# Patient Record
Sex: Female | Born: 1940 | Race: White | Hispanic: No | Marital: Married | Smoking: Never smoker
Health system: Southern US, Community
[De-identification: ages and names within clinical notes are randomized; demographics above are authoritative.]

## PROBLEM LIST (undated history)

## (undated) DIAGNOSIS — I509 Heart failure, unspecified: Secondary | ICD-10-CM

## (undated) DIAGNOSIS — R0902 Hypoxemia: Secondary | ICD-10-CM

## (undated) DIAGNOSIS — Z5189 Encounter for other specified aftercare: Secondary | ICD-10-CM

## (undated) DIAGNOSIS — S83209A Unspecified tear of unspecified meniscus, current injury, unspecified knee, initial encounter: Secondary | ICD-10-CM

## (undated) DIAGNOSIS — IMO0002 Reserved for concepts with insufficient information to code with codable children: Secondary | ICD-10-CM

## (undated) DIAGNOSIS — Z1501 Genetic susceptibility to malignant neoplasm of breast: Secondary | ICD-10-CM

## (undated) DIAGNOSIS — I4510 Unspecified right bundle-branch block: Secondary | ICD-10-CM

## (undated) DIAGNOSIS — Z1509 Genetic susceptibility to other malignant neoplasm: Secondary | ICD-10-CM

## (undated) DIAGNOSIS — H269 Unspecified cataract: Secondary | ICD-10-CM

## (undated) DIAGNOSIS — M5136 Other intervertebral disc degeneration, lumbar region: Secondary | ICD-10-CM

## (undated) DIAGNOSIS — Z923 Personal history of irradiation: Secondary | ICD-10-CM

## (undated) DIAGNOSIS — C801 Malignant (primary) neoplasm, unspecified: Secondary | ICD-10-CM

## (undated) DIAGNOSIS — R87619 Unspecified abnormal cytological findings in specimens from cervix uteri: Secondary | ICD-10-CM

## (undated) DIAGNOSIS — E785 Hyperlipidemia, unspecified: Secondary | ICD-10-CM

## (undated) DIAGNOSIS — E7800 Pure hypercholesterolemia, unspecified: Secondary | ICD-10-CM

## (undated) DIAGNOSIS — M1990 Unspecified osteoarthritis, unspecified site: Secondary | ICD-10-CM

## (undated) DIAGNOSIS — M51369 Other intervertebral disc degeneration, lumbar region without mention of lumbar back pain or lower extremity pain: Secondary | ICD-10-CM

## (undated) DIAGNOSIS — I10 Essential (primary) hypertension: Secondary | ICD-10-CM

## (undated) DIAGNOSIS — T7840XA Allergy, unspecified, initial encounter: Secondary | ICD-10-CM

## (undated) HISTORY — DX: Reserved for concepts with insufficient information to code with codable children: IMO0002

## (undated) HISTORY — PX: TUBAL LIGATION: SHX77

## (undated) HISTORY — DX: Unspecified osteoarthritis, unspecified site: M19.90

## (undated) HISTORY — DX: Unspecified abnormal cytological findings in specimens from cervix uteri: R87.619

## (undated) HISTORY — PX: HERNIA REPAIR: SHX51

## (undated) HISTORY — PX: JOINT REPLACEMENT: SHX530

## (undated) HISTORY — DX: Heart failure, unspecified: I50.9

## (undated) HISTORY — DX: Genetic susceptibility to other malignant neoplasm: Z15.09

## (undated) HISTORY — DX: Allergy, unspecified, initial encounter: T78.40XA

## (undated) HISTORY — DX: Hypoxemia: R09.02

## (undated) HISTORY — DX: Genetic susceptibility to malignant neoplasm of breast: Z15.01

## (undated) HISTORY — DX: Encounter for other specified aftercare: Z51.89

## (undated) HISTORY — DX: Unspecified cataract: H26.9

## (undated) HISTORY — DX: Pure hypercholesterolemia, unspecified: E78.00

## (undated) HISTORY — PX: TOTAL HIP ARTHROPLASTY: SHX124

## (undated) HISTORY — DX: Essential (primary) hypertension: I10

## (undated) HISTORY — DX: Hyperlipidemia, unspecified: E78.5

---

## 2002-04-27 ENCOUNTER — Encounter: Payer: Self-pay | Admitting: Family Medicine

## 2002-06-08 ENCOUNTER — Encounter: Payer: Self-pay | Admitting: Family Medicine

## 2005-03-13 ENCOUNTER — Encounter: Payer: Self-pay | Admitting: Family Medicine

## 2006-06-12 LAB — CONVERTED CEMR LAB: Pap Smear: NORMAL

## 2007-05-13 DIAGNOSIS — Z1501 Genetic susceptibility to malignant neoplasm of breast: Secondary | ICD-10-CM

## 2007-05-13 HISTORY — DX: Genetic susceptibility to malignant neoplasm of breast: Z15.01

## 2007-05-13 HISTORY — PX: BREAST SURGERY: SHX581

## 2007-05-13 HISTORY — PX: BREAST LUMPECTOMY: SHX2

## 2007-06-28 ENCOUNTER — Ambulatory Visit: Payer: Self-pay | Admitting: Family Medicine

## 2007-06-28 DIAGNOSIS — E785 Hyperlipidemia, unspecified: Secondary | ICD-10-CM | POA: Insufficient documentation

## 2007-06-28 DIAGNOSIS — I10 Essential (primary) hypertension: Secondary | ICD-10-CM | POA: Insufficient documentation

## 2007-06-28 DIAGNOSIS — I4510 Unspecified right bundle-branch block: Secondary | ICD-10-CM | POA: Insufficient documentation

## 2007-06-30 ENCOUNTER — Ambulatory Visit: Payer: Self-pay | Admitting: Family Medicine

## 2007-07-08 ENCOUNTER — Encounter: Payer: Self-pay | Admitting: Family Medicine

## 2007-07-08 ENCOUNTER — Ambulatory Visit: Payer: Self-pay | Admitting: Family Medicine

## 2007-07-08 ENCOUNTER — Other Ambulatory Visit: Admission: RE | Admit: 2007-07-08 | Discharge: 2007-07-08 | Payer: Self-pay | Admitting: Family Medicine

## 2007-07-08 LAB — CONVERTED CEMR LAB
ALT: 24 units/L (ref 0–35)
Alkaline Phosphatase: 48 units/L (ref 39–117)
BUN: 21 mg/dL (ref 6–23)
CO2: 29 meq/L (ref 19–32)
Creatinine, Ser: 1 mg/dL (ref 0.4–1.2)
Potassium: 4 meq/L (ref 3.5–5.1)
Total Bilirubin: 0.7 mg/dL (ref 0.3–1.2)
Total Protein: 7 g/dL (ref 6.0–8.3)
Triglycerides: 114 mg/dL (ref 0–149)

## 2007-07-29 ENCOUNTER — Encounter: Admission: RE | Admit: 2007-07-29 | Discharge: 2007-07-29 | Payer: Self-pay | Admitting: Family Medicine

## 2007-08-10 ENCOUNTER — Ambulatory Visit: Payer: Self-pay | Admitting: Family Medicine

## 2007-08-10 LAB — FECAL OCCULT BLOOD, GUAIAC: Fecal Occult Blood: NEGATIVE

## 2007-08-10 LAB — CONVERTED CEMR LAB: OCCULT 2: NEGATIVE

## 2007-08-12 ENCOUNTER — Ambulatory Visit: Payer: Self-pay | Admitting: Family Medicine

## 2007-08-18 ENCOUNTER — Encounter: Admission: RE | Admit: 2007-08-18 | Discharge: 2007-08-18 | Payer: Self-pay | Admitting: Family Medicine

## 2007-08-18 ENCOUNTER — Encounter (INDEPENDENT_AMBULATORY_CARE_PROVIDER_SITE_OTHER): Payer: Self-pay | Admitting: Diagnostic Radiology

## 2007-08-18 ENCOUNTER — Telehealth: Payer: Self-pay | Admitting: Family Medicine

## 2007-08-18 ENCOUNTER — Encounter: Payer: Self-pay | Admitting: Family Medicine

## 2007-08-18 HISTORY — PX: BREAST BIOPSY: SHX20

## 2007-08-26 ENCOUNTER — Encounter: Admission: RE | Admit: 2007-08-26 | Discharge: 2007-08-26 | Payer: Self-pay | Admitting: Family Medicine

## 2007-10-06 ENCOUNTER — Encounter: Payer: Self-pay | Admitting: Family Medicine

## 2007-10-06 ENCOUNTER — Encounter (HOSPITAL_BASED_OUTPATIENT_CLINIC_OR_DEPARTMENT_OTHER): Payer: Self-pay | Admitting: General Surgery

## 2007-10-06 ENCOUNTER — Encounter: Admission: RE | Admit: 2007-10-06 | Discharge: 2007-10-06 | Payer: Self-pay | Admitting: General Surgery

## 2007-10-06 ENCOUNTER — Ambulatory Visit (HOSPITAL_COMMUNITY): Admission: RE | Admit: 2007-10-06 | Discharge: 2007-10-06 | Payer: Self-pay | Admitting: General Surgery

## 2007-10-15 ENCOUNTER — Encounter: Payer: Self-pay | Admitting: Family Medicine

## 2007-10-16 ENCOUNTER — Emergency Department (HOSPITAL_COMMUNITY): Admission: EM | Admit: 2007-10-16 | Discharge: 2007-10-16 | Payer: Self-pay | Admitting: Emergency Medicine

## 2007-11-03 ENCOUNTER — Encounter: Payer: Self-pay | Admitting: Family Medicine

## 2007-11-03 LAB — HM COLONOSCOPY: HM Colonoscopy: NORMAL

## 2007-11-08 ENCOUNTER — Ambulatory Visit: Payer: Self-pay | Admitting: Oncology

## 2007-11-10 ENCOUNTER — Ambulatory Visit: Admission: RE | Admit: 2007-11-10 | Discharge: 2008-01-28 | Payer: Self-pay | Admitting: Radiation Oncology

## 2007-11-30 LAB — CBC WITH DIFFERENTIAL/PLATELET
Basophils Absolute: 0 10*3/uL (ref 0.0–0.1)
EOS%: 5.6 % (ref 0.0–7.0)
Eosinophils Absolute: 0.2 10*3/uL (ref 0.0–0.5)
HGB: 12.8 g/dL (ref 11.6–15.9)
MONO#: 0.4 10*3/uL (ref 0.1–0.9)
NEUT#: 2 10*3/uL (ref 1.5–6.5)
RDW: 13.6 % (ref 11.3–14.5)
lymph#: 1.8 10*3/uL (ref 0.9–3.3)

## 2007-12-01 LAB — COMPREHENSIVE METABOLIC PANEL
AST: 20 U/L (ref 0–37)
Albumin: 4.2 g/dL (ref 3.5–5.2)
BUN: 23 mg/dL (ref 6–23)
CO2: 26 mEq/L (ref 19–32)
Calcium: 9.2 mg/dL (ref 8.4–10.5)
Chloride: 105 mEq/L (ref 96–112)
Glucose, Bld: 108 mg/dL — ABNORMAL HIGH (ref 70–99)
Potassium: 3.7 mEq/L (ref 3.5–5.3)

## 2007-12-01 LAB — VITAMIN D 25 HYDROXY (VIT D DEFICIENCY, FRACTURES): Vit D, 25-Hydroxy: 29 ng/mL — ABNORMAL LOW (ref 30–89)

## 2007-12-27 ENCOUNTER — Ambulatory Visit: Payer: Self-pay | Admitting: Family Medicine

## 2008-01-10 ENCOUNTER — Ambulatory Visit: Payer: Self-pay | Admitting: Oncology

## 2008-01-13 ENCOUNTER — Encounter: Payer: Self-pay | Admitting: Family Medicine

## 2008-01-24 ENCOUNTER — Ambulatory Visit (HOSPITAL_COMMUNITY): Admission: RE | Admit: 2008-01-24 | Discharge: 2008-01-24 | Payer: Self-pay | Admitting: Oncology

## 2008-02-15 ENCOUNTER — Ambulatory Visit: Payer: Self-pay | Admitting: Family Medicine

## 2008-04-21 ENCOUNTER — Ambulatory Visit: Payer: Self-pay | Admitting: Oncology

## 2008-04-25 LAB — CBC WITH DIFFERENTIAL/PLATELET
BASO%: 0.5 % (ref 0.0–2.0)
Eosinophils Absolute: 0.2 10*3/uL (ref 0.0–0.5)
HCT: 36.6 % (ref 34.8–46.6)
LYMPH%: 39.4 % (ref 14.0–48.0)
MCHC: 34.7 g/dL (ref 32.0–36.0)
MCV: 99.9 fL (ref 81.0–101.0)
MONO#: 0.3 10*3/uL (ref 0.1–0.9)
MONO%: 8.6 % (ref 0.0–13.0)
NEUT%: 47.2 % (ref 39.6–76.8)
Platelets: 157 10*3/uL (ref 145–400)
WBC: 3.6 10*3/uL — ABNORMAL LOW (ref 3.9–10.0)

## 2008-04-26 LAB — COMPREHENSIVE METABOLIC PANEL
Alkaline Phosphatase: 52 U/L (ref 39–117)
CO2: 25 mEq/L (ref 19–32)
Creatinine, Ser: 1.24 mg/dL — ABNORMAL HIGH (ref 0.40–1.20)
Glucose, Bld: 107 mg/dL — ABNORMAL HIGH (ref 70–99)
Total Bilirubin: 0.5 mg/dL (ref 0.3–1.2)

## 2008-04-26 LAB — LACTATE DEHYDROGENASE: LDH: 146 U/L (ref 94–250)

## 2008-04-26 LAB — VITAMIN D 25 HYDROXY (VIT D DEFICIENCY, FRACTURES): Vit D, 25-Hydroxy: 32 ng/mL (ref 30–89)

## 2008-05-02 ENCOUNTER — Encounter: Payer: Self-pay | Admitting: Family Medicine

## 2008-05-30 ENCOUNTER — Telehealth: Payer: Self-pay | Admitting: Family Medicine

## 2008-07-04 ENCOUNTER — Ambulatory Visit: Payer: Self-pay | Admitting: Family Medicine

## 2008-07-11 ENCOUNTER — Other Ambulatory Visit: Admission: RE | Admit: 2008-07-11 | Discharge: 2008-07-11 | Payer: Self-pay | Admitting: Family Medicine

## 2008-07-11 ENCOUNTER — Encounter: Payer: Self-pay | Admitting: Family Medicine

## 2008-07-11 ENCOUNTER — Ambulatory Visit: Payer: Self-pay | Admitting: Family Medicine

## 2008-07-11 LAB — CONVERTED CEMR LAB
Albumin: 3.5 g/dL (ref 3.5–5.2)
BUN: 18 mg/dL (ref 6–23)
Cholesterol: 139 mg/dL (ref 0–200)
Creatinine, Ser: 1.2 mg/dL (ref 0.4–1.2)
GFR calc Af Amer: 58 mL/min
GFR calc non Af Amer: 48 mL/min
HDL: 43.5 mg/dL (ref >39.0)
LDL Cholesterol: 76 mg/dL (ref 0–99)
Triglycerides: 99 mg/dL (ref 0–149)
VLDL: 20 mg/dL (ref 0–40)

## 2008-07-13 ENCOUNTER — Encounter (INDEPENDENT_AMBULATORY_CARE_PROVIDER_SITE_OTHER): Payer: Self-pay | Admitting: *Deleted

## 2008-08-01 ENCOUNTER — Encounter: Admission: RE | Admit: 2008-08-01 | Discharge: 2008-08-01 | Payer: Self-pay | Admitting: Oncology

## 2008-08-17 ENCOUNTER — Ambulatory Visit: Payer: Self-pay | Admitting: Oncology

## 2008-08-21 LAB — CBC WITH DIFFERENTIAL/PLATELET
BASO%: 0.4 % (ref 0.0–2.0)
EOS%: 2.6 % (ref 0.0–7.0)
MCH: 34.4 pg — ABNORMAL HIGH (ref 25.1–34.0)
MCHC: 34.3 g/dL (ref 31.5–36.0)
MONO#: 0.3 10*3/uL (ref 0.1–0.9)
NEUT%: 46.7 % (ref 38.4–76.8)
RBC: 3.62 10*6/uL — ABNORMAL LOW (ref 3.70–5.45)
RDW: 13.4 % (ref 11.2–14.5)
WBC: 4.4 10*3/uL (ref 3.9–10.3)
lymph#: 1.9 10*3/uL (ref 0.9–3.3)

## 2008-08-22 LAB — COMPREHENSIVE METABOLIC PANEL
ALT: 20 U/L (ref 0–35)
AST: 18 U/L (ref 0–37)
CO2: 28 mEq/L (ref 19–32)
Calcium: 9.6 mg/dL (ref 8.4–10.5)
Chloride: 102 mEq/L (ref 96–112)
Creatinine, Ser: 1.22 mg/dL — ABNORMAL HIGH (ref 0.40–1.20)
Potassium: 3.8 mEq/L (ref 3.5–5.3)
Sodium: 140 mEq/L (ref 135–145)
Total Protein: 7.2 g/dL (ref 6.0–8.3)

## 2008-08-28 ENCOUNTER — Encounter: Payer: Self-pay | Admitting: Family Medicine

## 2008-09-01 ENCOUNTER — Encounter: Payer: Self-pay | Admitting: Family Medicine

## 2008-11-07 ENCOUNTER — Ambulatory Visit: Payer: Self-pay | Admitting: Family Medicine

## 2008-11-08 ENCOUNTER — Encounter: Admission: RE | Admit: 2008-11-08 | Discharge: 2008-11-08 | Payer: Self-pay | Admitting: Family Medicine

## 2008-11-09 ENCOUNTER — Encounter: Payer: Self-pay | Admitting: Family Medicine

## 2008-11-10 ENCOUNTER — Encounter: Payer: Self-pay | Admitting: Family Medicine

## 2008-11-17 ENCOUNTER — Encounter: Payer: Self-pay | Admitting: Family Medicine

## 2008-12-28 ENCOUNTER — Encounter: Payer: Self-pay | Admitting: Family Medicine

## 2009-01-29 ENCOUNTER — Ambulatory Visit: Payer: Self-pay | Admitting: Family Medicine

## 2009-02-16 ENCOUNTER — Ambulatory Visit: Payer: Self-pay | Admitting: Oncology

## 2009-02-20 LAB — CBC WITH DIFFERENTIAL/PLATELET
Basophils Absolute: 0 10*3/uL (ref 0.0–0.1)
EOS%: 2.9 % (ref 0.0–7.0)
Eosinophils Absolute: 0.1 10*3/uL (ref 0.0–0.5)
HCT: 35.3 % (ref 34.8–46.6)
HGB: 12.2 g/dL (ref 11.6–15.9)
MCH: 34.9 pg — ABNORMAL HIGH (ref 25.1–34.0)
MCV: 100.6 fL (ref 79.5–101.0)
MONO%: 7 % (ref 0.0–14.0)
NEUT#: 2.2 10*3/uL (ref 1.5–6.5)
NEUT%: 51 % (ref 38.4–76.8)
RDW: 13.3 % (ref 11.2–14.5)
lymph#: 1.6 10*3/uL (ref 0.9–3.3)

## 2009-02-21 LAB — COMPREHENSIVE METABOLIC PANEL
AST: 23 U/L (ref 0–37)
Albumin: 3.8 g/dL (ref 3.5–5.2)
BUN: 24 mg/dL — ABNORMAL HIGH (ref 6–23)
Calcium: 9.4 mg/dL (ref 8.4–10.5)
Chloride: 105 mEq/L (ref 96–112)
Creatinine, Ser: 1.06 mg/dL (ref 0.40–1.20)
Glucose, Bld: 120 mg/dL — ABNORMAL HIGH (ref 70–99)
Potassium: 3.9 mEq/L (ref 3.5–5.3)

## 2009-02-21 LAB — CANCER ANTIGEN 27.29: CA 27.29: 14 U/mL (ref 0–39)

## 2009-02-21 LAB — LACTATE DEHYDROGENASE: LDH: 154 U/L (ref 94–250)

## 2009-02-21 LAB — VITAMIN D 25 HYDROXY (VIT D DEFICIENCY, FRACTURES): Vit D, 25-Hydroxy: 32 ng/mL (ref 30–89)

## 2009-02-27 ENCOUNTER — Encounter: Payer: Self-pay | Admitting: Family Medicine

## 2009-03-20 ENCOUNTER — Ambulatory Visit: Payer: Self-pay | Admitting: Family Medicine

## 2009-06-30 ENCOUNTER — Encounter
Admission: RE | Admit: 2009-06-30 | Discharge: 2009-06-30 | Payer: Self-pay | Source: Home / Self Care | Admitting: Orthopedic Surgery

## 2009-07-10 LAB — HM MAMMOGRAPHY: HM Mammogram: NORMAL

## 2009-07-25 ENCOUNTER — Telehealth: Payer: Self-pay | Admitting: Family Medicine

## 2009-08-02 ENCOUNTER — Encounter: Admission: RE | Admit: 2009-08-02 | Discharge: 2009-08-02 | Payer: Self-pay | Admitting: Oncology

## 2009-08-08 ENCOUNTER — Ambulatory Visit: Payer: Self-pay | Admitting: Oncology

## 2009-08-09 ENCOUNTER — Ambulatory Visit: Payer: Self-pay | Admitting: Family Medicine

## 2009-08-10 LAB — CONVERTED CEMR LAB
AST: 24 units/L (ref 0–37)
Albumin: 3.5 g/dL (ref 3.5–5.2)
BUN: 20 mg/dL (ref 6–23)
CO2: 26 meq/L (ref 19–32)
Calcium: 9.2 mg/dL (ref 8.4–10.5)
Cholesterol: 157 mg/dL (ref 0–200)
GFR calc non Af Amer: 52.68 mL/min (ref >60)
Glucose, Bld: 97 mg/dL (ref 70–99)
HDL: 54.7 mg/dL (ref >39.00)
Potassium: 4.1 meq/L (ref 3.5–5.1)
Total Protein: 7.3 g/dL (ref 6.0–8.3)
VLDL: 25.2 mg/dL (ref 0.0–40.0)

## 2009-08-10 LAB — CANCER ANTIGEN 27.29: CA 27.29: 12 U/mL (ref 0–39)

## 2009-08-10 LAB — LACTATE DEHYDROGENASE: LDH: 150 U/L (ref 94–250)

## 2009-08-10 LAB — COMPREHENSIVE METABOLIC PANEL
BUN: 23 mg/dL (ref 6–23)
CO2: 25 mEq/L (ref 19–32)
Creatinine, Ser: 0.99 mg/dL (ref 0.40–1.20)
Glucose, Bld: 105 mg/dL — ABNORMAL HIGH (ref 70–99)
Total Bilirubin: 0.3 mg/dL (ref 0.3–1.2)

## 2009-08-10 LAB — CBC WITH DIFFERENTIAL/PLATELET
BASO%: 0.5 % (ref 0.0–2.0)
Eosinophils Absolute: 0.2 10*3/uL (ref 0.0–0.5)
HCT: 37 % (ref 34.8–46.6)
LYMPH%: 34.2 % (ref 14.0–49.7)
MCHC: 34.1 g/dL (ref 31.5–36.0)
MONO#: 0.4 10*3/uL (ref 0.1–0.9)
NEUT%: 54.6 % (ref 38.4–76.8)
Platelets: 180 10*3/uL (ref 145–400)
WBC: 4.9 10*3/uL (ref 3.9–10.3)

## 2009-08-10 LAB — VITAMIN D 25 HYDROXY (VIT D DEFICIENCY, FRACTURES): Vit D, 25-Hydroxy: 35 ng/mL (ref 30–89)

## 2009-08-17 ENCOUNTER — Encounter: Payer: Self-pay | Admitting: Family Medicine

## 2009-10-12 ENCOUNTER — Ambulatory Visit: Payer: Self-pay | Admitting: Family Medicine

## 2009-10-12 ENCOUNTER — Other Ambulatory Visit: Admission: RE | Admit: 2009-10-12 | Discharge: 2009-10-12 | Payer: Self-pay | Admitting: Family Medicine

## 2009-10-16 ENCOUNTER — Encounter: Payer: Self-pay | Admitting: Family Medicine

## 2009-10-18 ENCOUNTER — Encounter
Admission: RE | Admit: 2009-10-18 | Discharge: 2009-10-18 | Payer: Self-pay | Source: Home / Self Care | Admitting: Family Medicine

## 2009-10-18 ENCOUNTER — Encounter: Payer: Self-pay | Admitting: Family Medicine

## 2009-10-19 ENCOUNTER — Encounter (INDEPENDENT_AMBULATORY_CARE_PROVIDER_SITE_OTHER): Payer: Self-pay | Admitting: *Deleted

## 2009-10-19 LAB — CONVERTED CEMR LAB: Pap Smear: NEGATIVE

## 2009-11-14 ENCOUNTER — Telehealth: Payer: Self-pay | Admitting: Family Medicine

## 2010-02-26 ENCOUNTER — Ambulatory Visit: Payer: Self-pay | Admitting: Oncology

## 2010-02-28 LAB — COMPREHENSIVE METABOLIC PANEL
ALT: 16 U/L (ref 0–35)
AST: 17 U/L (ref 0–37)
Albumin: 3.8 g/dL (ref 3.5–5.2)
Alkaline Phosphatase: 48 U/L (ref 39–117)
BUN: 26 mg/dL — ABNORMAL HIGH (ref 6–23)
Calcium: 9.5 mg/dL (ref 8.4–10.5)
Chloride: 105 mEq/L (ref 96–112)
Potassium: 3.9 mEq/L (ref 3.5–5.3)
Sodium: 143 mEq/L (ref 135–145)
Total Protein: 7 g/dL (ref 6.0–8.3)

## 2010-02-28 LAB — CBC WITH DIFFERENTIAL/PLATELET
BASO%: 0.5 % (ref 0.0–2.0)
Basophils Absolute: 0 10*3/uL (ref 0.0–0.1)
EOS%: 1.7 % (ref 0.0–7.0)
Eosinophils Absolute: 0.1 10*3/uL (ref 0.0–0.5)
HCT: 36.1 % (ref 34.8–46.6)
HGB: 12.6 g/dL (ref 11.6–15.9)
LYMPH%: 28.4 % (ref 14.0–49.7)
MCH: 35.2 pg — ABNORMAL HIGH (ref 25.1–34.0)
MCHC: 34.8 g/dL (ref 31.5–36.0)
MCV: 101.2 fL — ABNORMAL HIGH (ref 79.5–101.0)
MONO#: 0.3 10*3/uL (ref 0.1–0.9)
MONO%: 6.4 % (ref 0.0–14.0)
NEUT#: 2.9 10*3/uL (ref 1.5–6.5)
NEUT%: 63 % (ref 38.4–76.8)
Platelets: 156 10*3/uL (ref 145–400)
RBC: 3.56 10*6/uL — ABNORMAL LOW (ref 3.70–5.45)
RDW: 13.1 % (ref 11.2–14.5)
WBC: 4.6 10*3/uL (ref 3.9–10.3)
lymph#: 1.3 10*3/uL (ref 0.9–3.3)

## 2010-03-12 ENCOUNTER — Encounter: Payer: Self-pay | Admitting: Family Medicine

## 2010-04-02 ENCOUNTER — Encounter: Payer: Self-pay | Admitting: Family Medicine

## 2010-04-17 ENCOUNTER — Encounter: Payer: Self-pay | Admitting: Family Medicine

## 2010-04-17 ENCOUNTER — Emergency Department (HOSPITAL_COMMUNITY)
Admission: EM | Admit: 2010-04-17 | Discharge: 2010-04-17 | Payer: Self-pay | Source: Home / Self Care | Admitting: Emergency Medicine

## 2010-05-12 HISTORY — PX: DILATION AND CURETTAGE OF UTERUS: SHX78

## 2010-05-31 ENCOUNTER — Ambulatory Visit
Admission: RE | Admit: 2010-05-31 | Discharge: 2010-05-31 | Payer: Self-pay | Source: Home / Self Care | Attending: Family Medicine | Admitting: Family Medicine

## 2010-06-01 ENCOUNTER — Other Ambulatory Visit: Payer: Self-pay | Admitting: Oncology

## 2010-06-01 DIAGNOSIS — Z9889 Other specified postprocedural states: Secondary | ICD-10-CM

## 2010-06-02 ENCOUNTER — Encounter: Payer: Self-pay | Admitting: Family Medicine

## 2010-06-04 ENCOUNTER — Telehealth: Payer: Self-pay | Admitting: Family Medicine

## 2010-06-11 ENCOUNTER — Telehealth: Payer: Self-pay | Admitting: Family Medicine

## 2010-06-11 NOTE — Letter (Signed)
[facility]   Imported By: Lanelle Bal 09/06/2009 14:01:20  _____________________________________________________________________  External Attachment:    Type:   Image     Comment:   External Document

## 2010-06-11 NOTE — Letter (Signed)
[facility]   Imported By: Lester Lake 10/29/2009 12:28:59  _____________________________________________________________________  External Attachment:    Type:   Image     Comment:   External Document

## 2010-06-11 NOTE — Letter (Signed)
Summary: [facility]  [facility]   Imported By: Maryln Gottron 04/08/2010 12:21:37  _____________________________________________________________________  External Attachment:    Type:   Image     Comment:   External Document

## 2010-06-11 NOTE — Progress Notes (Signed)
Summary: shingles vaccine  Phone Note Call from Patient Call back at Home Phone [phone]   Caller: Patient Call For: Kerby Nora MD Summary of Call: Patient is asking for a rx for the shingles vaccine so that she can take it to her pharmacy. She wants the rx given to [facility] to take it to her.  Initial call taken by: Melody Comas,  November 14, 2009 3:47 PM  Follow-up for Phone Call        Rx in out box     Herpes Zoster Result Date:  11/14/2009 Herpes Zoster Result:  given at [facility]

## 2010-06-11 NOTE — Progress Notes (Signed)
Summary: LABS  Phone Note Call from Patient   Caller: Patient Call For: Kerby Nora MD Summary of Call: PATIENT WANTS TO KNOW WHAT LABS SHE NEEDS HAVE DONE.. Initial call taken by: Benny Lennert CMA Duncan Dull),  July 25, 2009 2:59 PM  Follow-up for Phone Call        Please scheulde for CPX with labs prior.  Dx 272.0 CMET, lipids Fasting Follow-up by: Kerby Nora MD,  July 25, 2009 3:01 PM  Additional Follow-up for Phone Call Additional follow up Details #1::        APPT made and patient advised Additional Follow-up by: Benny Lennert CMA Duncan Dull),  July 26, 2009 9:31 AM

## 2010-06-11 NOTE — Assessment & Plan Note (Signed)
Summary: CPX/HMW   Vital Signs:  Patient profile:   70 year old female Height:      65.25 inches Weight:      199.4 pounds BMI:     33.05 Temp:     98.5 degrees F oral Pulse rate:   64 / minute Pulse rhythm:   regular BP sitting:   122 / 76  (left arm) Cuff size:   large  Vitals Entered By: Benny Lennert CMA Duncan Dull) (October 12, 2009 2:32 PM)  History of Present Illness: Chief complaint cpx  Cortisone in back and hip..some improvement. ORTHO..Dr. Charlann Boxer. Possible need for hip replacement. Going to [facility] 3 times a week.  Weight loss 5 lbs in last 6 months.  High cholesterol well controlle don crestor and fish oil.   Breast Cancer 2 years since surgery...on tamoxifen for 5 years.  Stopped actonel.  Due for bone density.   Hypertension History:      She denies headache, chest pain, palpitations, dyspnea with exertion, orthopnea, peripheral edema, and side effects from treatment.  Well controlled. Continue current medication. Marland Kitchen        Positive major cardiovascular risk factors include female age 32 years old or older, hyperlipidemia, and hypertension.  Negative major cardiovascular risk factors include non-tobacco-user status.     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 Take 1/2 Tablet By Mouth Once Daily 2)  Crestor 10 Mg  Tabs (Rosuvastatin Calcium) .... Take 1 Tablet By Mouth 3 Times Per Week 3)  Adult Aspirin Ec Low Strength 81 Mg  Tbec (Aspirin) .... Take 1 Tablet By Mouth Once A Day 4)  Multivitamins   Tabs (Multiple Vitamin) .... Take 1 Tablet By Mouth Once A Day 5)  Fish Oil 1000 Mg  Caps (Omega-3 Fatty  Acids) .... Take 1 Capsule By Mouth Once A Day 6)  Sam-E Complete 200 Mg  Tbec (S-Adenosylmethionine) .... Take 1 Tablet By Mouth Once A Day 7)  Msm Glucosamine Complex   Tabs (Glucos-Msm-C-Mn-Ginger-Willow) .... Take 1 Tablet By Mouth Once A Day 8)  Calcium Citrate 200 Mg Tabs (Calcium Citrate) .... 1,000 Mg. Take 1 Tablet By Mouth Two Times A Day 9)  B-100  Tabs (Vitamins-Lipotropics) .... Take 1 Tablet By Mouth Once A Day 10)  Tamoxifen Citrate 10 Mg Tabs (Tamoxifen Citrate) .... Take 1 Tablet By Mouth Once A Day 11)  Vitamin D 2000 Unit Tabs (Cholecalciferol) .... Takes 2 Tablets Daily  Allergies: 1)  ! Penicillin 2)  ! Suprax  Past History:  Past medical, surgical, family and social histories (including risk factors) reviewed, and no changes noted (except as noted below).  Past Medical History: Reviewed history from 11/07/2008 and no changes required. Hypertension Hyperlipidemia BRCA  Past Surgical History: Reviewed history from 07/11/2008 and no changes required. 7/07 umbilical hernia repair 1986 cyst from neck, anterior 1981 BTL 09/2007 lumpectomy, radiation, no checmo  Family History: Reviewed history from 06/28/2007 and no changes required. father: HTN, heart problems, low thyroid Mother: breast cancer age 54, colon cancer age 73 son with Down's syndrome siblings; asthma  Social History: Reviewed history from 06/28/2007 and no changes required. 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 CV:  Denies chest pain or discomfort. Resp:  Denies shortness of breath. GI:  Denies abdominal pain, bloody stools, constipation, and diarrhea. GU:  Denies dysuria.  Physical Exam  General:  GEN:  Well-developed,well-nourished,in no acute distress; alert,appropriate and cooperative throughout examination HEENT: Normocephalic and atraumatic without obvious abnormalities. No apparent alopecia or balding. Ears, externally no deformities PULM: Breathing comfortably in no respiratory distress EXT: No clubbing, cyanosis, or edema PSYCH: Normally interactive. Cooperative during the interview. Pleasant. Friendly and conversant. Not anxious or depressed appearing. Normal, full affect.  Eyes:  No corneal or conjunctival inflammation noted. EOMI. Perrla. Funduscopic exam benign, without hemorrhages, exudates or papilledema. Vision grossly normal. Ears:  External ear exam shows no significant lesions or deformities.  Otoscopic examination reveals clear canals, tympanic membranes are intact bilaterally without bulging, retraction, inflammation or discharge. Hearing is grossly normal bilaterally. Nose:  External nasal examination shows no deformity or inflammation. Nasal mucosa are pink and moist without lesions or exudates. Mouth:  Oral mucosa and oropharynx without lesions or exudates.  Teeth in good repair. Neck:  no carotid bruit or thyromegaly no cervical or supraclavicular lymphadenopathy  Chest Wall:  No deformities, masses, or tenderness noted. Breasts:  No mass, nodules, thickening, tenderness, bulging, retraction, inflamation, nipple discharge or skin changes noted.   Healed scars noted.  Lungs:  Normal respiratory effort, chest expands symmetrically. Lungs are clear to auscultation, no crackles or wheezes. Heart:  Normal rate and regular rhythm. S1 and S2 normal without gallop, murmur, click, rub or other extra sounds. Abdomen:  Bowel sounds positive,abdomen soft and non-tender without masses, organomegaly or hernias noted. Rectal:  no external abnormalities.   Genitalia:  Pelvic Exam:        External: normal female genitalia without lesions or masses        Vagina: normal without lesions or  masses        Cervix: normal without lesions or masses        Adnexa: normal bimanual exam without masses or fullness        Uterus: normal by palpation        Pap smear: performed No urethral abnormalities.  Msk:  No deformity or scoliosis noted of thoracic or lumbar spine.   Pulses:  R and L posterior tibial pulses are full and equal bilaterally  Extremities:  no edema Skin:  Intact without suspicious lesions or rashes Psych:  Cognition and judgment appear intact. Alert and cooperative with normal attention span and concentration. No apparent delusions, illusions, hallucinations   Impression & Recommendations:  Problem # 1:  HYPERLIPIDEMIA (ICD-272.4)  Well controlled. Continue current medication.  Her updated medication list for this problem includes:    Crestor 10 Mg Tabs (Rosuvastatin calcium) .Marland Kitchen... Take 1 tablet by mouth 3 times per week  Labs Reviewed: SGOT: 24 (08/09/2009)   SGPT: 24 (08/09/2009)  10 Yr Risk Heart Disease: 7 % Prior 10 Yr Risk Heart Disease: 13 % (07/11/2008)   HDL:54.70 (08/09/2009), 43.5 (07/04/2008)  LDL:77 (08/09/2009), 76 (07/04/2008)  Chol:157 (08/09/2009), 139 (07/04/2008)  Trig:126.0 (08/09/2009), 99 (07/04/2008)  Problem # 2:  OSTEOPENIA (ICD-733.90) Due for reeval.  Her updated medication list for this problem includes:    Calcium Citrate 200 Mg Tabs (Calcium citrate) .Marland KitchenMarland KitchenMarland KitchenMarland Kitchen  1,000 mg. take 1 tablet by mouth two times a day    Vitamin D 2000 Unit Tabs (Cholecalciferol) .Marland Kitchen... Takes 2 tablets daily  Orders: Radiology Referral (Radiology)  Problem # 3:  HYPERTENSION (ICD-401.9)  Well controlled. Continue current medication.  Her updated medication list for this problem includes:    Maxzide-25 37.5-25 Mg Tabs (Triamterene-hctz) .Marland Kitchen... Take 1/2 tablet by mouth once daily  Orders: Prescription Created Electronically [phone])  BP today: 122/76 Prior BP: 118/78 (03/20/2009)  10 Yr Risk Heart Disease: 7 % Prior 10 Yr Risk Heart Disease: 13 %  (07/11/2008)  Labs Reviewed: K+: 4.1 (08/09/2009) Creat: : 1.1 (08/09/2009)   Chol: 157 (08/09/2009)   HDL: 54.70 (08/09/2009)   LDL: 77 (08/09/2009)   TG: 126.0 (08/09/2009)  Problem # 4:  ROUTINE GYNECOLOGICAL EXAMINATION (ICD-V72.31)  Orders: Pelvic & Breast Exam ( Medicare)  ([id number])  Complete Medication List: 1)  Maxzide-25 37.5-25 Mg Tabs (Triamterene-hctz) .... Take 1/2 tablet by mouth once daily 2)  Crestor 10 Mg Tabs (Rosuvastatin calcium) .... Take 1 tablet by mouth 3 times per week 3)  Adult Aspirin Ec Low Strength 81 Mg Tbec (Aspirin) .... Take 1 tablet by mouth once a day 4)  Multivitamins Tabs (Multiple vitamin) .... Take 1 tablet by mouth once a day 5)  Fish Oil 1000 Mg Caps (Omega-3 fatty acids) .... Take 1 capsule by mouth once a day 6)  Sam-e Complete 200 Mg Tbec (S-adenosylmethionine) .... Take 1 tablet by mouth once a day 7)  Msm Glucosamine Complex Tabs (Glucos-msm-c-mn-ginger-willow) .... Take 1 tablet by mouth once a day 8)  Calcium Citrate 200 Mg Tabs (Calcium citrate) .... 1,000 mg. take 1 tablet by mouth two times a day 9)  B-100 Tabs (Vitamins-lipotropics) .... Take 1 tablet by mouth once a day 10)  Tamoxifen Citrate 10 Mg Tabs (Tamoxifen citrate) .... Take 1 tablet by mouth once a day 11)  Vitamin D 2000 Unit Tabs (Cholecalciferol) .... Takes 2 tablets daily  Hypertension Assessment/Plan:      The patient's hypertensive risk group is category B: At least one risk factor (excluding diabetes) with no target organ damage.  Her calculated 10 year risk of coronary heart disease is 7 %.  Today's blood pressure is 122/76.  Her blood pressure goal is < 140/90.  Patient Instructions: 1)  Referral Appointment Information 2)  Day/Date: 3)  Time: 4)  Place/MD: 5)  Address: 6)  Phone/Fax: 7)  Patient given appointment information. Information/Orders faxed/mailed.  8)  Check with insurance to see shingles vaccine coverage. Response: in several months when it is in.    Prescriptions: MAXZIDE-25 37.5-25 MG  TABS (TRIAMTERENE-HCTZ) Take 1/2 tablet by mouth once daily  #45 x 3   Entered and Authorized by:   Kerby Nora MD   Signed by:   Kerby Nora MD on 10/12/2009   Method used:   Electronically to        [facility]* (retail)       [address]       [place], [place]  [address]       Ph: [phone]       Fax: [phone]   RxID:   [id number] CRESTOR 10 MG  TABS (ROSUVASTATIN CALCIUM) Take 1 tablet by mouth 3 times per week  #36 x 3   Entered and Authorized by:   Kerby Nora MD   Signed by:   Kerby Nora MD on 10/12/2009   Method used:   Electronically to  [facility]* (retail)       [address]       [place], [place]  [address]       Ph: [phone]       Fax: [phone]   RxID:   [id number]   Current Allergies (reviewed today): ! PENICILLIN ! SUPRAX  Last Colonoscopy:  normal (11/03/2007 11:30:45 AM) Colonoscopy Next Due:  5 yr Last Mammogram:  abnormal right (07/27/2007 8:40:05 AM) Mammogram Result Date:  07/10/2009 Mammogram Result:  normal Mammogram Next Due:  1 yr

## 2010-06-11 NOTE — Letter (Signed)
Summary: [facility]  [facility]   Imported By: Maryln Gottron 04/05/2010 14:13:49  _____________________________________________________________________  External Attachment:    Type:   Image     Comment:   External Document

## 2010-06-11 NOTE — Letter (Signed)
Summary: Results Follow up Letter  [place] at [facility]  [address] [place], [place] [address]   Phone: [phone]  Fax: [phone]    10/19/2009 MRN: [id number]     [facility] [address] [place], [place]  [address]    Dear Ms. Ahrendt,  The following are the results of your recent test(s):  Test         Result    Pap Smear:        Normal __x___  Not Normal _____ Comments:Repeat in 1 year ______________________________________________________ Cholesterol: LDL(Bad cholesterol):         Your goal is less than:         HDL (Good cholesterol):       Your goal is more than: Comments:  ______________________________________________________ Mammogram:        Normal _____  Not Normal _____ Comments:  ___________________________________________________________________ Hemoccult:        Normal _____  Not normal _______ Comments:    _____________________________________________________________________ Other Tests:    We routinely do not discuss normal results over the telephone.  If you desire a copy of the results, or you have any questions about this information we can discuss them at your next office visit.   Sincerely,  Kerby Nora MD

## 2010-06-11 NOTE — Letter (Signed)
Summary: Results Follow up Letter  [place] at [facility]  [address] [place], [place] [address]   Phone: [phone]  Fax: [phone]    10/19/2009 MRN: [id number]    [facility] [address] [place], [place]  [address]   Dear Ms. Berrett,  The following are the results of your recent test(s):  Test         Result    Pap Smear:        Normal _____  Not Normal _____ Comments: ______________________________________________________ Cholesterol: LDL(Bad cholesterol):         Your goal is less than:         HDL (Good cholesterol):       Your goal is more than: Comments:  ______________________________________________________ Mammogram:        Normal _____  Not Normal _____ Comments:  ___________________________________________________________________ Hemoccult:        Normal _____  Not normal _______ Comments:    _____________________________________________________________________ Other Tests:   Bone Density:  Bone density is now normal...no changes needed,.. Ttamoxifem for breast cancer likely helping .  Recheck in 2 years.    We routinely do not discuss normal results over the telephone.  If you desire a copy of the results, or you have any questions about this information we can discuss them at your next office visit.   Sincerely,    Kerby Nora, M.D.

## 2010-06-13 NOTE — Consult Note (Signed)
Summary: [facility]  [facility]   Imported By: Lanelle Bal 04/25/2010 13:15:28  _____________________________________________________________________  External Attachment:    Type:   Image     Comment:   External Document

## 2010-06-13 NOTE — Assessment & Plan Note (Signed)
Summary: PRE-OP CLEARANCE FOR DR.ALUISIO/CE   Vital Signs:  Patient profile:   70 year old female Height:      65.25 inches Weight:      190.0 pounds BMI:     31.49 Temp:     98.4 degrees F oral Pulse rate:   64 / minute Pulse rhythm:   regular BP sitting:   130 / 80  (left arm) Cuff size:   large  Vitals Entered By: Benny Lennert CMA Duncan Dull) (May 31, 2010 9:36 AM)  History of Present Illness: Chief complaint pre-op clearence  70year old female with history of HTN, high cholesterol and RBBB.Marland Kitchen presents for pre op clearance for left  hip replacement. Surgery scheduled Feb 15th... for lab pre op eval.  Dr. Aron Baba. at [facility].   Dx with RBBB 10 years ago.. saw cardiologist at that point.  Had cardiolyte stress test:   Last suregry was for breast cancer 09/2007.. no complications with this surgery.     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 Take 1/2 Tablet By Mouth Once Daily 2)  Crestor 10 Mg  Tabs (Rosuvastatin Calcium) .... Take 1 Tablet By Mouth 3 Times Per Week 3)  Adult Aspirin Ec Low Strength 81 Mg  Tbec (Aspirin) .... Take 1 Tablet By Mouth Once A Day 4)  Multivitamins   Tabs (Multiple Vitamin) .... Take 1 Tablet By Mouth Once A Day 5)  Fish Oil 1000 Mg  Caps (Omega-3 Fatty Acids) .... Take 1 Capsule By Mouth Once A Day 6)  Sam-E Complete 200 Mg  Tbec (S-Adenosylmethionine) .... Take 1 Tablet By Mouth Once A Day 7)  Msm Glucosamine Complex   Tabs (Glucos-Msm-C-Mn-Ginger-Willow) .... Take 1 Tablet By Mouth Once A Day 8)  Calcium Citrate 200 Mg Tabs (Calcium Citrate) .... 1,000 Mg. Take 1 Tablet By Mouth Two Times A Day 9)   B-100  Tabs (Vitamins-Lipotropics) .... Take 1 Tablet By Mouth Once A Day 10)  Tamoxifen Citrate 10 Mg Tabs (Tamoxifen Citrate) .... Take 1 Tablet By Mouth Once A Day 11)  Vitamin D 2000 Unit Tabs (Cholecalciferol) .... Takes 2 Tablets Daily  Allergies: 1)  ! Penicillin 2)  ! Suprax  Past History:  Past medical, surgical, family and social histories (including risk factors) reviewed, and no changes noted (except as noted below).  Past Medical History: Reviewed history from 11/07/2008 and no changes required. Hypertension Hyperlipidemia BRCA  Past Surgical History: Reviewed history from 07/11/2008 and no changes required. 7/07 umbilical hernia repair 1986 cyst from neck, anterior 1981 BTL 09/2007 lumpectomy, radiation, no checmo  Family History: Reviewed history from 06/28/2007 and no changes required. father: HTN, heart problems, low thyroid Mother: breast cancer age 35, colon cancer age 5 son with Down's syndrome siblings; asthma  Social History: Reviewed history from 06/28/2007 and no changes required. 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 CV:  Denies chest pain or discomfort, near fainting, palpitations, and swelling of feet.  Resp:  Denies shortness of breath. GI:  Denies abdominal pain and bloody stools. GU:  Denies dysuria; Urinary uregency x 3 months Has noted uterus pushing out in vaginal canal.   Some clear discharge. No vaginal itching or irritation. . Derm:  Denies rash. Psych:  Denies anxiety and depression.  Physical Exam  General:  overwiehgt female iNNAD  Eyes:  No corneal or conjunctival inflammation noted. EOMI. Perrla. Funduscopic exam benign, without hemorrhages, exudates or papilledema. Vision  grossly normal. Ears:  External ear exam shows no significant lesions or deformities.  Otoscopic examination reveals clear canals, tympanic membranes are intact bilaterally without bulging, retraction, inflammation or discharge. Hearing is grossly normal bilaterally. Nose:  External nasal examination shows no deformity or inflammation. Nasal mucosa are pink and moist without lesions or exudates. Mouth:  Oral mucosa and oropharynx without lesions or exudates.  Teeth in good repair. Small oropharynx.  Neck:  full ROM in neck Lungs:  Normal respiratory effort, chest expands symmetrically. Lungs are clear to auscultation, no crackles or wheezes. Heart:  Normal rate and regular rhythm. S1 and S2 normal without gallop, murmur, click, rub or other extra sounds. Abdomen:  Bowel sounds positive,abdomen soft and non-tender without masses, organomegaly or hernias noted. Msk:  No deformity or scoliosis noted of thoracic or lumbar spine.   Pulses:  R and L posterior tibial pulses are full and equal bilaterally  Extremities:  no edema Neurologic:  No cranial nerve deficits noted. Station and gait are normal. Plantar reflexes are down-going bilaterally. DTRs are symmetrical throughout. Sensory, motor and coordinative functions appear intact. Skin:  Intact without suspicious lesions or rashes Psych:  Cognition and judgment appear intact. Alert and cooperative with normal attention span and concentration. No apparent delusions, illusions, hallucinations   Impression & Recommendations:  Problem # 1:  PREOPERATIVE EXAMINATION (ICD-V72.84) Low risk for  a moderate risk surgery.   EKG, labs and CXR needed  (will be done pre op in hospital) but no further cardiac eval needed if these are in nml range.  Problem # 2:  BUNDLE BRANCH BLOCK, RIGHT (ICD-426.4) needs EKG priro to suregery to reeval. Asymptomatic.   Problem # 3:  ? of UTERINE PROLAPSE (ICD-618.1) Not interested in vaginal exam today or treatment prior  to this upcoming surgery. Will reeval at [facility] in next 6 months... if causing more issue prior .. will send to GYN for pessary/surgery.   Complete Medication List: 1)  Maxzide-25 37.5-25 Mg Tabs (Triamterene-hctz) .... Take 1/2 tablet by mouth once daily 2)  Crestor 10 Mg Tabs (Rosuvastatin calcium) .... Take 1 tablet by mouth 3 times per week 3)  Adult Aspirin Ec Low Strength 81 Mg Tbec (Aspirin) .... Take 1 tablet by mouth once a day 4)  Multivitamins Tabs (Multiple vitamin) .... Take 1 tablet by mouth once a day 5)  Fish Oil 1000 Mg Caps (Omega-3 fatty acids) .... Take 1 capsule by mouth once a day 6)  Sam-e Complete 200 Mg Tbec (S-adenosylmethionine) .... Take 1 tablet by mouth once a day 7)  Msm Glucosamine Complex Tabs (Glucos-msm-c-mn-ginger-willow) .... Take 1 tablet by mouth once a day 8)  Calcium Citrate 200 Mg Tabs (Calcium citrate) .... 1,000 mg. take 1 tablet by mouth two times a day 9)  B-100 Tabs (Vitamins-lipotropics) .... Take 1 tablet by mouth once a day 10)  Tamoxifen Citrate 10 Mg Tabs (Tamoxifen citrate) .... Take 1 tablet by mouth once a day 11)  Vitamin D 2000 Unit Tabs (Cholecalciferol) .... Takes  2 tablets daily  Patient Instructions: 1)  Fasting lipids, CMET Dx 272.0 prior to appt.  2)  Schedule medicare annual wellness for after mid 10/2010.    Orders Added: 1)  Est. Patient 65& > [81191]    Current Allergies (reviewed today): ! PENICILLIN ! SUPRAX

## 2010-06-13 NOTE — Progress Notes (Signed)
Summary: refills  Phone Note Refill Request Message from:  Patient on June 04, 2010 9:36 AM  Refills Requested: Medication #1:  tizanadine 4 mg   Supply Requested: 1 month [facility] [address]   Method Requested: Electronic Initial call taken by: Benny Lennert CMA (AAMA),  June 04, 2010 9:37 AM    New/Updated Medications: TIZANIDINE HCL 4 MG TABS (TIZANIDINE HCL) 1 tab by mouth at bedtime as needed muscle spasm Prescriptions: TIZANIDINE HCL 4 MG TABS (TIZANIDINE HCL) 1 tab by mouth at bedtime as needed muscle spasm  #30 x 0   Entered by:   Benny Lennert CMA (AAMA)   Authorized by:   Kerby Nora MD   Signed by:   Benny Lennert CMA (AAMA) on 06/04/2010   Method used:   Faxed to ...       [facility] (retail)       [address]       [place], [place]    [place]       Ph: [phone]       Fax: [phone]   RxID:   [id number] TIZANIDINE HCL 4 MG TABS (TIZANIDINE HCL) 1 tab by mouth at bedtime as needed muscle spasm  #30 x 0   Entered and Authorized by:   Kerby Nora MD   Signed by:   Kerby Nora MD on 06/04/2010   Method used:   Telephoned to ...       [facility] (retail)       [address]       [place], [place]    [place]       Ph: [phone]       Fax: [phone]   RxID:   [id number] MAXZIDE-25 37.5-25 MG  TABS (TRIAMTERENE-HCTZ) Take 1/2 tablet by mouth once daily  #45 x 3   Entered by:   Benny Lennert CMA (AAMA)   Authorized by:   Kerby Nora MD   Signed by:   Benny Lennert CMA (AAMA) on 06/04/2010   Method used:   Faxed to ...       [facility] (retail)       [address]       [place], [place]    [place]       Ph: [phone]       Fax: [phone]   RxID:   [id number]

## 2010-06-18 ENCOUNTER — Ambulatory Visit (HOSPITAL_COMMUNITY)
Admission: RE | Admit: 2010-06-18 | Discharge: 2010-06-18 | Disposition: A | Discharge status: Home / Self Care | Payer: Medicare Other | Source: Ambulatory Visit | Attending: Orthopedic Surgery | Admitting: Orthopedic Surgery

## 2010-06-18 ENCOUNTER — Other Ambulatory Visit (HOSPITAL_COMMUNITY): Payer: Self-pay | Admitting: Orthopedic Surgery

## 2010-06-18 ENCOUNTER — Encounter (HOSPITAL_COMMUNITY): Payer: Medicare Other

## 2010-06-18 DIAGNOSIS — M25559 Pain in unspecified hip: Secondary | ICD-10-CM | POA: Insufficient documentation

## 2010-06-18 DIAGNOSIS — M169 Osteoarthritis of hip, unspecified: Secondary | ICD-10-CM

## 2010-06-18 DIAGNOSIS — Z01818 Encounter for other preprocedural examination: Secondary | ICD-10-CM | POA: Insufficient documentation

## 2010-06-18 LAB — URINALYSIS, ROUTINE W REFLEX MICROSCOPIC
Bilirubin Urine: NEGATIVE
Hgb urine dipstick: NEGATIVE
Specific Gravity, Urine: 1.011 (ref 1.005–1.030)
Urine Glucose, Fasting: NEGATIVE mg/dL
Urobilinogen, UA: 0.2 mg/dL (ref 0.0–1.0)
pH: 7.5 (ref 5.0–8.0)

## 2010-06-18 LAB — COMPREHENSIVE METABOLIC PANEL
AST: 22 U/L (ref 0–37)
CO2: 28 mEq/L (ref 19–32)
Calcium: 9.6 mg/dL (ref 8.4–10.5)
Creatinine, Ser: 0.94 mg/dL (ref 0.4–1.2)
GFR calc Af Amer: >60 mL/min (ref >60)
GFR calc non Af Amer: 59 mL/min — ABNORMAL LOW (ref >60)
Sodium: 141 mEq/L (ref 135–145)
Total Protein: 7.4 g/dL (ref 6.0–8.3)

## 2010-06-18 LAB — PROTIME-INR: Prothrombin Time: 14.2 seconds (ref 11.6–15.2)

## 2010-06-18 LAB — CBC
HCT: 37.4 % (ref 36.0–46.0)
MCH: 33.2 pg (ref 26.0–34.0)
MCV: 100 fL (ref 78.0–100.0)
RBC: 3.74 MIL/uL — ABNORMAL LOW (ref 3.87–5.11)
WBC: 4.8 10*3/uL (ref 4.0–10.5)

## 2010-06-18 LAB — SURGICAL PCR SCREEN
MRSA, PCR: NEGATIVE
Staphylococcus aureus: NEGATIVE

## 2010-06-18 LAB — URINE MICROSCOPIC-ADD ON

## 2010-06-18 LAB — APTT: aPTT: 34 seconds (ref 24–37)

## 2010-06-19 NOTE — Progress Notes (Signed)
Summary: Handicapped sticker  Phone Note Call from Patient   Caller: Patient Call For: Kerby Nora MD Summary of Call: Patient is getting ready to have hip replacement and has had a hard time walking for a while. Would you sign for patient to get a handicapped sticker to use until she recovers from her surgery? Form is in your in box. Please give to [facility] when it is signed. Thanks Initial call taken by: Sydell Axon LPN,  June 11, 2010 8:24 AM  Follow-up for Phone Call        signed in outbox.  Follow-up by: Kerby Nora MD,  June 11, 2010 8:50 AM  Additional Follow-up for Phone Call Additional follow up Details #1::        given to regina Additional Follow-up by: Benny Lennert CMA Duncan Dull),  June 11, 2010 8:51 AM

## 2010-06-26 ENCOUNTER — Inpatient Hospital Stay (HOSPITAL_COMMUNITY): Payer: Medicare Other

## 2010-06-26 ENCOUNTER — Inpatient Hospital Stay (HOSPITAL_COMMUNITY)
Admission: RE | Admit: 2010-06-26 | Discharge: 2010-06-29 | DRG: 470 | Disposition: A | Discharge status: Home Health | Payer: Medicare Other | Attending: Orthopedic Surgery | Admitting: Orthopedic Surgery

## 2010-06-26 DIAGNOSIS — M1610 Unilateral primary osteoarthritis, unspecified hip: Principal | ICD-10-CM | POA: Diagnosis present

## 2010-06-26 DIAGNOSIS — I10 Essential (primary) hypertension: Secondary | ICD-10-CM | POA: Diagnosis present

## 2010-06-26 DIAGNOSIS — M169 Osteoarthritis of hip, unspecified: Principal | ICD-10-CM | POA: Diagnosis present

## 2010-06-26 LAB — TYPE AND SCREEN: Antibody Screen: NEGATIVE

## 2010-06-26 NOTE — Op Note (Signed)
Darlene Lawrence, Darlene Lawrence              ACCOUNT NO.:  [id number]  MEDICAL RECORD NO.:  [id number]           PATIENT TYPE:  O  LOCATION:  XRAY                         FACILITY:  [facility]  PHYSICIAN:  Ollen Gross, M.D.    DATE OF BIRTH:  04-07-1941  DATE OF PROCEDURE: DATE OF DISCHARGE:  06/18/2010                              OPERATIVE REPORT   PREOPERATIVE DIAGNOSIS:  Osteoarthritis of left hip.  POSTOPERATIVE DIAGNOSIS:  Osteoarthritis of left hip.  PROCEDURE:  Left total hip arthroplasty.  SURGEON:  Ollen Gross, M.D.  ASSISTANT:  Alexzandrew L. Perkins, P.A.C.  ANESTHESIA:  General.  ESTIMATED BLOOD LOSS:  300.  DRAIN:  Hemovac x1.  COMPLICATIONS:  None.  CONDITION:  Stable to recovery.  BRIEF CLINICAL NOTE:  Ms. Gunnels is a 70 year old female with advanced end-stage arthritis of the left hip with progressively worsening pain and dysfunction.  She has failed nonoperative management and presents for total hip arthroplasty.  PROCEDURE IN DETAIL:  After successful administration of general anesthetic, the patient is placed in the right lateral decubitus position with the left side up and held with the hip positioner.  The left lower extremity is isolated from her perineum with plastic drapes and prepped and draped in the usual sterile fashion.  Short posterolateral incision was made through the skin through a very thick layer of subcutaneous tissue to the fascia lata which was incised in line with the skin incision.  Sciatic nerve was palpated and protected and short rotators and capsule were isolated off the femur.  Capsulotomy was performed, and the hip was dislocated.  The center of femoral head is marked and a trial prosthesis was placed such that the center of the trial head corresponds to the center of the native femoral head. Osteotomy line was marked on the femoral neck and osteotomy made with an oscillating saw.  Femoral head was removed.  Femoral retractors  were placed at the proximal femur to gain access to the canal.  The canal finder was placed, and then the femoral canal was thoroughly irrigated to remove fatty contents.  Axial reaming is performed to 13.5 mm proximal reaming to an 18 F and the sleeve machine to a large.  An 40 F large trial sleeve was placed.  The femur was retracted anteriorly to gain acetabular exposure. Acetabular retractors were placed, and labrum and osteophytes were removed.  Acetabular reaming was performed up to 55 mm and then a 56-mm Pinnacle acetabular shell was impacted in anatomic position with excellent purchase and 2 dome screws were placed.  Apex hole eliminator was placed, and a 36-mm neutral +4 Marathon liner placed.  The trial stem was an 18 x 13 with 36 +8.  Her native anteversion was about 60-65 degrees which was felt to be way too high.  I placed her in about 30 degrees in anteversion with the trial stem.  A 36 +0 trial head is placed.  The hip is reduced with outstanding stability.  There was full extension, full external rotation, 70 degrees flexion, 40 degrees adduction, 90 degrees internal rotation, 90 degrees of flexion, and 70 degrees internal rotation.  The left leg was placed on top of the right, and leg lengths were found to be equal.  Hip was dislocated, and the trials were removed.  The permanent 41 F large sleeve was placed and the 18 x 13 stem with a 36 +8 neck was placed to about 30 degrees of anteversion which was slightly more than usual but the far less than her native anteversion.  The 36 +0 ceramic head was placed.  Please note that the Marathon liner was the 36 mm neutral +4.  The hip was reduced with same stability parameters.  Once again, I placed the left leg on top of the right.  It was felt leg lengths were equal.  The wound was copiously irrigated with saline solution, and the capsule and short rotators reattached to the femur through drill holes with Ethibond suture.   Fascia lata was closed over Hemovac drain with interrupted #1 Vicryl, subcutaneous closed with #1 and 2-0 Vicryl and subcuticular with running 4-0 Monocryl.  The incision was then cleaned and dried, and the catheter for Marcaine pain pump is placed and the pump was initiated. Steri-Strips and a bulky sterile dressing were applied, and she was placed into a knee immobilizer, awakened, and transported to recovery in stable condition.     Ollen Gross, M.D.     FA/MEDQ  D:  06/26/2010  T:  06/26/2010  Job:  [id number]  Electronically Signed by Ollen Gross M.D. on 06/26/2010 02:54:28 PM

## 2010-06-27 LAB — BASIC METABOLIC PANEL
CO2: 29 mEq/L (ref 19–32)
Glucose, Bld: 130 mg/dL — ABNORMAL HIGH (ref 70–99)
Potassium: 4.4 mEq/L (ref 3.5–5.1)
Sodium: 142 mEq/L (ref 135–145)

## 2010-06-27 LAB — CBC
Hemoglobin: 9.7 g/dL — ABNORMAL LOW (ref 12.0–15.0)
MCH: 33 pg (ref 26.0–34.0)
MCHC: 32.7 g/dL (ref 30.0–36.0)
MCV: 101 fL — ABNORMAL HIGH (ref 78.0–100.0)
RBC: 2.94 MIL/uL — ABNORMAL LOW (ref 3.87–5.11)

## 2010-06-28 LAB — BASIC METABOLIC PANEL
BUN: 14 mg/dL (ref 6–23)
CO2: 29 mEq/L (ref 19–32)
Chloride: 102 mEq/L (ref 96–112)
Glucose, Bld: 116 mg/dL — ABNORMAL HIGH (ref 70–99)
Potassium: 3.6 mEq/L (ref 3.5–5.1)

## 2010-06-28 LAB — CBC
HCT: 26.1 % — ABNORMAL LOW (ref 36.0–46.0)
Hemoglobin: 8.4 g/dL — ABNORMAL LOW (ref 12.0–15.0)
MCH: 32.4 pg (ref 26.0–34.0)
MCHC: 32.2 g/dL (ref 30.0–36.0)
MCV: 100.8 fL — ABNORMAL HIGH (ref 78.0–100.0)

## 2010-06-29 LAB — CBC
HCT: 25.3 % — ABNORMAL LOW (ref 36.0–46.0)
MCHC: 32.8 g/dL (ref 30.0–36.0)
MCV: 99.6 fL (ref 78.0–100.0)
RDW: 13.1 % (ref 11.5–15.5)
WBC: 5.2 10*3/uL (ref 4.0–10.5)

## 2010-07-18 NOTE — H&P (Signed)
NAMEKELEE, Darlene Lawrence              ACCOUNT NO.:  [id number]  MEDICAL RECORD NO.:  [id number]           PATIENT TYPE:  I  LOCATION:  1533                         FACILITY:  [facility]  PHYSICIAN:  Ollen Gross, M.D.    DATE OF BIRTH:  [birth year]  DATE OF ADMISSION:  06/26/2010 DATE OF DISCHARGE:  06/29/2010                             HISTORY & PHYSICAL   CHIEF COMPLAINT:  Left hip pain.  HISTORY OF PRESENT ILLNESS:  The patient is a 70 year old female who has been seen by Dr. Lequita Halt for ongoing left hip pain.  She has been previously treated by Dr. Durene Romans in the past and developed arthritis, progressive in nature.  She was seen as a new patient by Dr. Lequita Halt with worsening progressive symptoms and x-rays show end-stage arthritis.  It is felt she would benefit from undergoing surgical intervention.  Risks and benefits have been discussed.  She elects to proceed with surgery.  ALLERGIES: 1. PENICILLIN causes a rash. 2. SUPRAX caused a rash.  CURRENT MEDICATIONS:  Triamterene/HCTZ, tamoxifen, Crestor, multivitamin, fish oil, SAM-e Complete, calcium citrate, glucosamine with MSN, B complex, baby aspirin, vitamin D3.  PAST MEDICAL HISTORY: 1. Hypertension. 2. Mild hypercholesterolemia. 3. Right bundle-branch block. 4. Right-sided breast cancer. 5. Childhood illnesses of measles and mumps.  PAST SURGICAL HISTORY:  Breast cancer, which was a lumpectomy; tubal ligation; umbilical hernia; cyst removal.  FAMILY HISTORY:  Father deceased at age 48.  Mother deceased at age 28.  SOCIAL HISTORY:  Married.  Retired.  Nonsmoker.  No alcohol.  Does have a caregiver lined up.  She has 3 steps entering her home.  She does have a living will and healthcare power of attorney.  REVIEW OF SYSTEMS:  GENERAL:  No fever, chills, or night sweats.  NEURO: No seizures, syncope, or paralysis.  RESPIRATORY:  No shortness breath, productive cough, or hemoptysis.  CARDIOVASCULAR:  No chest pain  or orthopnea.  GI:  No nausea, vomiting, diarrhea, or constipation.  GU: No dysuria, hematuria, or discharge.  MUSCULOSKELETAL:  Left hip.  PHYSICAL EXAMINATION:  VITAL SIGNS:  Pulse 68, respirations 12, blood pressure 106/58. GENERAL:  A 70 year old white female, well nourished, well developed, in no acute distress.  She is alert, oriented, and cooperative. HEENT:  Normocephalic, atraumatic.  Pupils are round and reactive.  EOMs intact.  Never wore glasses. NECK:  Supple. CHEST:  Clear. HEART:  Regular rate and rhythm without murmur, S1 and S2 noted. ABDOMEN:  Soft, nontender.  Bowel sounds present. RECTAL:  Not done, not pertinent to present illness. BREASTS:  Not done, not pertinent to present illness. GENITALIA:  Not done, not pertinent to present illness. EXTREMITIES:  Flexion 95, internal rotation 10, external rotation 30, abduction 30.  IMPRESSION:  Osteoarthritis, left hip.  PLAN:  The patient admitted to [facility] to undergo a left total hip replacement arthroplasty.  Surgery will be performed by Dr. Ollen Gross.     Alexzandrew L. Julien Girt, P.A.C.   ______________________________ Ollen Gross, M.D.    ALP/MEDQ  D:  07/14/2010  T:  07/15/2010  Job:  [id number]  cc:   Kerby Nora,  MD  Dr. Donnie Coffin  Electronically Signed by Patrica Duel P.A.C. on 07/15/2010 10:47:04 AM Electronically Signed by Ollen Gross M.D. on 07/17/2010 03:45:05 PM

## 2010-08-13 ENCOUNTER — Ambulatory Visit
Admission: RE | Admit: 2010-08-13 | Discharge: 2010-08-13 | Disposition: A | Discharge status: Home / Self Care | Payer: Medicare Other | Source: Ambulatory Visit | Attending: Oncology | Admitting: Oncology

## 2010-08-13 DIAGNOSIS — Z9889 Other specified postprocedural states: Secondary | ICD-10-CM

## 2010-08-13 NOTE — Discharge Summary (Signed)
NAMEJASHIRA, Darlene Lawrence              ACCOUNT NO.:  [id number]  MEDICAL RECORD NO.:  [id number]           PATIENT TYPE:  I  LOCATION:  1533                         [facility]ACILITY:  [facility]  PHYSICIAN:  Ollen Gross, M.D.    DATE OF BIRTH:  04/09/1941  DATE OF ADMISSION:  06/26/2010 DATE OF DISCHARGE:  06/29/2010                              DISCHARGE SUMMARY   ADMITTING DIAGNOSES: 1. Osteoarthritis, left hip. 2. Hypertension. 3. Mild hypercholesterolemia. 4. Right bundle branch block. 5. Right-sided breast cancer. 6. Childhood illnesses, measles and mumps.  DISCHARGE DIAGNOSES: 1. Osteoarthritis left hip status post left total hip replacement     arthroplasty. 2. Postop acute blood loss anemia. 3. Hypertension. 4. Mild hypercholesterolemia. 5. Right bundle branch block. 6. Right-sided breast cancer. 7. Childhood illnesses, measles and mumps.  PROCEDURE:  June 26, 2010, left total hip; Surgeon, Dr. Lequita Halt; assistant, Alexzandrew L. Perkins, P.A.C.  Anesthesia general.  Blood loss of 300 cc.  LABORATORY DATA:  Preop CBC showed hemoglobin of 12.4, hematocrit 37.4, white cell count of 4.8, platelets 141.  Postop hemoglobin 9.7 and 8.4. Last H and H 8.3 and 25.3.  PT/INR 14.2 and 1.08 with PTT of 34 on admission.  Serial pro times followed per Coumadin protocol.  Last PT/INR 30.1 and 2.86.  Chem panel on admission all within normal limits. Serial BMETs were followed.  Electrolytes remained within normal limits. Preop UA, large leukocyte esterase, few epithelials, 7-10 white cells, few bacteria.  Blood group type A+.  Nasal swabs were negative Staph aureus, negative for MRSA.  Postop films, pelvis and hip shows the left total hip arthroplasty.  EKG dated June 18, 2010, sinus bradycardia, RSR and QR pattern in V1, suggests right ventricular conduction delay. This is the change since last tracing of Oct 05, 2007, confirmed by Dr. Viann Fish.  HOSPITAL COURSE:  The patient  was admitted [facility], taken to OR, underwent above-stated procedure without complication.  The patient tolerated the procedure well and later transferred from recovery room to the orthopedic floor.  Given 24 hours postop IV antibiotics. Started on p.o. and IV analgesics.  Started on Coumadin for DVT prophylaxis.  She did have some pain through the night, a little bit better though in the morning of day 1, had decent urinary output, started back on her home meds.  Started to get up out of bed with therapy and allowed the partial weightbearing.  By day 2, the patient was doing a little bit better, no complaints, progressing with therapy, dressing change.  Incision looked good.  Hemoglobin was 8.4.  She is asymptomatic with this.  Continued to meet her goals and by postop day 3, she was doing well, tolerating meds, and discharged home.  DISCHARGE/PLAN: 1. The patient was discharged home on June 29, 2010. 2. Discharge diagnoses, please see above. 3. Discharge meds, Robaxin, Percocet, Coumadin.  Continue aspirin,     Crestor, Maxzide, tamoxifen, and tizanidine.  DIET:  Heart-healthy diet.  ACTIVITY:  She is partial weightbearing, 25% to 50%.  Hip precautions total protocol.  FOLLOWUP:  Follow up in 2 weeks.  DISPOSITION:  Home.  CONDITION ON DISCHARGE:  Improved.     Alexzandrew L. Julien Girt, P.A.C.   ______________________________ Ollen Gross, M.D.    ALP/MEDQ  D:  08/02/2010  T:  08/02/2010  Job:  [id number]  cc:   Kerby Nora, MD  Dr. Donnie Coffin  Electronically Signed by Patrica Duel P.A.C. on 08/05/2010 07:31:37 AM Electronically Signed by Ollen Gross M.D. on 08/13/2010 07:13:27 AM

## 2010-09-09 ENCOUNTER — Telehealth: Payer: Self-pay | Admitting: *Deleted

## 2010-09-09 DIAGNOSIS — N814 Uterovaginal prolapse, unspecified: Secondary | ICD-10-CM

## 2010-09-09 NOTE — Telephone Encounter (Signed)
Pt states you were going to refer her to a gyn for her pap and exam this year and she is asking who that will be.  She doesn't have a preference for location.

## 2010-09-11 DIAGNOSIS — N814 Uterovaginal prolapse, unspecified: Secondary | ICD-10-CM | POA: Insufficient documentation

## 2010-09-11 NOTE — Telephone Encounter (Signed)
Appt made with Dr Nicholaus Bloom on 10/24/2010 at 10:15am. [facility]

## 2010-09-24 NOTE — Op Note (Signed)
NAMELEILONI, SMITHERS              ACCOUNT NO.:  [id number]   MEDICAL RECORD NO.:  [id number]          PATIENT TYPE:  AMB   LOCATION:  SDS                          FACILITY:  [facility]   PHYSICIAN:  Leonie Man, M.D.   DATE OF BIRTH:  July 01, 1940   DATE OF PROCEDURE:  10/06/2007  DATE OF DISCHARGE:  10/06/2007                               OPERATIVE REPORT   PREOPERATIVE DIAGNOSIS:  Carcinoma of the right breast, T1N0M0.   POSTOPERATIVE DIAGNOSIS:  Carcinoma of the right breast, T1N0M0.   PROCEDURE:  Needle localized right-sided lumpectomy with a sentinel  lymph node biopsy.   SURGEON:  Leonie Man, MD   ASSISTANT:  OR nurse.   ANESTHESIA:  General.   The patient is a 70 year old female presenting with an abnormal  mammogram showing a spiculated lesion in the upper outer quadrant of the  right breast which on core biopsy showed this to be an invasive ductal  carcinoma.  On proliferation studies, the patient is ER/PR positive with  HER2 negative.  Ki-67 is 9%.  The patient comes to the operating room  now after the risks and potential benefits of the surgery have been  fully discussed, all questions answered, and consent obtained for  surgery.   Following the induction of satisfactory general anesthesia with the  patient positioned supinely, the subareolar area of the breast was  infiltrated with approximately 4 mL of methylene blue diluted.  Prior to  this procedure, the patient had undergone needle localization of the  lesion as well as injection of technetium sulfur colloid for sentinel  lymph node identification.  The right breast was then prepped and draped  to be included in a sterile operative field.  Positive identification of  the patient as Darlene Lawrence and carried out the operation to be  excisional biopsy of her right breast lesion with sentinel lymph node  biopsy on the right.   Just superior to the localizing needle, I made a transverse incision and  deepened this through the skin and subcutaneous tissue raising a flap  inferiorly and superiorly and bringing the localizing needle into the  operative field.  The lesion which was on x-ray located at approximately  36 mm from the skin was surrounded on all sides, carrying the deep  dissection down to approximately 7 cm.  The massive breast tissue was  removed in its entirety and forwarded for radiologic identification.  Specimen mammography showed that the lesion was within the specimen.  Hemostasis was obtained with electrocautery within the breast.  Sponge  and instrument counts were verified and the breast tissues were packed  while attention was turned to the right axilla.  With the use of a  NeoProbe, a area within the axilla was identified as having maximal  counts.  A transverse axillary incision was carried down through skin  and subcutaneous tissue using the NeoProbe to guide [place] to the area of  the sentinel node.  Hot blue sentinel node was dissected free from the  axilla and removed and forwarded for pathologic evaluation.  Touch preps  on this node showed no evidence  of metastatic carcinoma.  All other  areas tested within the axilla did not show any significant counts to  indicate any additional sentinel nodes.  All areas within the axilla  were then checked for hemostasis.  Additional bleeding points were  treated with electrocautery.  Sponge and instrument counts within the  axilla were noted to be correct.  The wounds were then closed in layers  as follows.  The breast wound was closed in 2 layers using interrupted 3-  0 Vicryl sutures for the subcutaneous layer and a running 5-0 Monocryl  suture in the skin.  Similarly, the subcutaneous tissues in the axilla  were closed with interrupted 3-0 Vicryl suture and the skin closed with  running 5-0 Monocryl suture.  The skin edges were then reinforced with  Dermabond.  The anesthetic was reversed and the patient was moved from   the operating room to the recovery room in stable condition.  She  tolerated the procedure well.      Leonie Man, M.D.  Electronically Signed     PB/MEDQ  D:  10/06/2007  T:  10/07/2007  Job:  [id number]

## 2010-10-22 ENCOUNTER — Other Ambulatory Visit: Payer: Self-pay | Admitting: Family Medicine

## 2010-10-22 DIAGNOSIS — E7800 Pure hypercholesterolemia, unspecified: Secondary | ICD-10-CM

## 2010-10-24 ENCOUNTER — Ambulatory Visit (INDEPENDENT_AMBULATORY_CARE_PROVIDER_SITE_OTHER): Payer: Medicare Other | Admitting: Obstetrics & Gynecology

## 2010-10-24 ENCOUNTER — Other Ambulatory Visit: Payer: Self-pay | Admitting: Obstetrics & Gynecology

## 2010-10-24 ENCOUNTER — Other Ambulatory Visit (HOSPITAL_COMMUNITY)
Admission: RE | Admit: 2010-10-24 | Discharge: 2010-10-24 | Disposition: A | Discharge status: Home / Self Care | Payer: Medicare Other | Source: Ambulatory Visit | Attending: Obstetrics & Gynecology | Admitting: Obstetrics & Gynecology

## 2010-10-24 DIAGNOSIS — Z01419 Encounter for gynecological examination (general) (routine) without abnormal findings: Secondary | ICD-10-CM

## 2010-10-24 DIAGNOSIS — N950 Postmenopausal bleeding: Secondary | ICD-10-CM

## 2010-10-24 DIAGNOSIS — Z1272 Encounter for screening for malignant neoplasm of vagina: Secondary | ICD-10-CM

## 2010-10-24 DIAGNOSIS — Z124 Encounter for screening for malignant neoplasm of cervix: Secondary | ICD-10-CM | POA: Insufficient documentation

## 2010-10-24 DIAGNOSIS — Z1159 Encounter for screening for other viral diseases: Secondary | ICD-10-CM | POA: Insufficient documentation

## 2010-10-28 ENCOUNTER — Encounter (HOSPITAL_BASED_OUTPATIENT_CLINIC_OR_DEPARTMENT_OTHER): Payer: Medicare Other | Admitting: Oncology

## 2010-10-28 ENCOUNTER — Ambulatory Visit (HOSPITAL_COMMUNITY)
Admission: RE | Admit: 2010-10-28 | Discharge: 2010-10-28 | Disposition: A | Discharge status: Home / Self Care | Payer: Medicare Other | Source: Ambulatory Visit | Attending: Obstetrics & Gynecology | Admitting: Obstetrics & Gynecology

## 2010-10-28 ENCOUNTER — Other Ambulatory Visit: Payer: Self-pay | Admitting: Oncology

## 2010-10-28 DIAGNOSIS — N950 Postmenopausal bleeding: Secondary | ICD-10-CM | POA: Insufficient documentation

## 2010-10-28 DIAGNOSIS — D259 Leiomyoma of uterus, unspecified: Secondary | ICD-10-CM | POA: Insufficient documentation

## 2010-10-28 DIAGNOSIS — Z170 Estrogen receptor positive status [ER+]: Secondary | ICD-10-CM

## 2010-10-28 DIAGNOSIS — C50419 Malignant neoplasm of upper-outer quadrant of unspecified female breast: Secondary | ICD-10-CM

## 2010-10-28 LAB — CBC WITH DIFFERENTIAL/PLATELET
BASO%: 0.4 % (ref 0.0–2.0)
Eosinophils Absolute: 0.2 10*3/uL (ref 0.0–0.5)
MONO#: 0.4 10*3/uL (ref 0.1–0.9)
NEUT#: 2.5 10*3/uL (ref 1.5–6.5)
RBC: 3.54 10*6/uL — ABNORMAL LOW (ref 3.70–5.45)
RDW: 14.2 % (ref 11.2–14.5)
WBC: 4.7 10*3/uL (ref 3.9–10.3)
lymph#: 1.6 10*3/uL (ref 0.9–3.3)

## 2010-10-28 LAB — COMPREHENSIVE METABOLIC PANEL
Alkaline Phosphatase: 48 U/L (ref 39–117)
BUN: 11 mg/dL (ref 6–23)
Creatinine, Ser: 0.93 mg/dL (ref 0.50–1.10)
Glucose, Bld: 100 mg/dL — ABNORMAL HIGH (ref 70–99)
Total Bilirubin: 0.5 mg/dL (ref 0.3–1.2)

## 2010-10-28 NOTE — Assessment & Plan Note (Unsigned)
NAMEGERTHA, LICHTENBERG NO.:  [id number]  MEDICAL RECORD NO.:  [id number]           PATIENT TYPE:  LOCATION:  [facility]           FACILITY:  PHYSICIAN:  Allie Bossier, MD        DATE OF BIRTH:  1941/05/12  DATE OF SERVICE:  10/24/2010                                 CLINIC NOTE  [name] is a 70 year old married white G3, P2, A1 breast cancer survivor who comes here for a new patient exam.  She sees Dr. Ermalene Lawrence for her as her primary care doctor, but Dr. Ermalene Lawrence recommended she come here because the patient complains of a vaginal cystocele for sometime now that is causing her discomfort.  She also told me that for the last 6-8 months she has been having a brownish vaginal discharge.  Of note, she has been on tamoxifen since about 2009.  Her other past medical history is significant for urge incontinence, hypertension, hyperlipidemia, and history of right breast cancer in 2001 that was treated with lumpectomy and radiation therapy as well as tamoxifen.  REVIEW OF SYSTEMS:  She and her husband moved from [place] in 2008 to be near their 32 year old son here.  Her 75 year old son has Down syndrome and lives with her and her husband.  She rarely has sex, but when she does she denies dyspareunia.  Dr. Donnie Coffin is her oncologist and she still sees him.  The last Pap smear was June of last year.  Her mammogram was March 2012, and last colonoscopy was 2009.  MEDICATIONS: 1. Tamoxifen 20 mg daily. 2. Crestor 10 mg daily. 3. Triamterene/hydrochlorothiazide 37.5/25 mg.  She takes half a     tablet daily.  No latex allergies.  No drug allergies.  FAMILY HISTORY:  Significant for breast and colon cancer in her mother who actually did not die of either of these diseases.  She denies family history of GYN cancers.  PREVIOUS SURGERY:  She had a cyst removed from her neck, laparoscopic tubal ligation, left hip replacement February 2012, umbilical hernia repair,  right lumpectomy followed by radiation.  PHYSICAL EXAMINATION:  GENERAL:  Well-nourished, well-hydrated pleasant white female. VITAL SIGNS:  Height 5 feet 6 inches, weight 194 pounds, blood pressure 143/97, pulse 54. HEENT:  Normal. HEART:  Regular rate and rhythm. LUNGS:  Clear to auscultation bilaterally. BREASTS:  Normal. ABDOMEN:  Moderate centripetal obesity.  No palpable hepatosplenomegaly. EXTERNAL GENITALIA:  Marked atrophy.  She has a grade 4 cystocele.  No rectocele.  Her cervix was prolapsed.  She got a second-degree uterine prolapse.  Bimanual exam reveals her uterus to be at the umbilicus, and her adnexa are not palpable.  ASSESSMENT AND PLAN: 1. Annual exam, checked Pap smear, and recommended self-breast and     self-vulvar exams (which she does not do). 2. Postmenopausal bleeding on long-term tamoxifen with uterine     enlargement.  I am getting an ultrasound as soon as possible, and     she will come back very soon for an endometrial biopsy.  I have     recommend she take 800 of ibuprofen prior to this visit.  We have     talked about the increased risk  of uterine cancer with tamoxifen,     and she has been counseled definitely not to skip either her     ultrasound or the next appointment.     Allie Bossier, MD    MCD/MEDQ  D:  10/24/2010  T:  10/25/2010  Job:  [id number]

## 2010-10-29 ENCOUNTER — Other Ambulatory Visit (INDEPENDENT_AMBULATORY_CARE_PROVIDER_SITE_OTHER): Payer: Medicare Other

## 2010-10-29 DIAGNOSIS — E7800 Pure hypercholesterolemia, unspecified: Secondary | ICD-10-CM

## 2010-10-29 LAB — COMPREHENSIVE METABOLIC PANEL
ALT: 25 U/L (ref 0–35)
Albumin: 3.3 g/dL — ABNORMAL LOW (ref 3.5–5.2)
CO2: 25 mEq/L (ref 19–32)
Calcium: 8.9 mg/dL (ref 8.4–10.5)
Chloride: 110 mEq/L (ref 96–112)
GFR: 67.91 mL/min (ref >60.00)
Glucose, Bld: 91 mg/dL (ref 70–99)
Potassium: 3.6 mEq/L (ref 3.5–5.1)
Sodium: 140 mEq/L (ref 135–145)
Total Bilirubin: 0.5 mg/dL (ref 0.3–1.2)
Total Protein: 6.6 g/dL (ref 6.0–8.3)

## 2010-10-29 LAB — LIPID PANEL: Cholesterol: 117 mg/dL (ref 0–200)

## 2010-10-29 NOTE — Progress Notes (Signed)
Addended by: Liane Comber C on: 10/29/2010 08:30 AM   Modules accepted: Orders

## 2010-10-31 ENCOUNTER — Other Ambulatory Visit (INDEPENDENT_AMBULATORY_CARE_PROVIDER_SITE_OTHER): Payer: Medicare Other | Admitting: Obstetrics & Gynecology

## 2010-10-31 DIAGNOSIS — N950 Postmenopausal bleeding: Secondary | ICD-10-CM

## 2010-11-01 ENCOUNTER — Encounter: Payer: Self-pay | Admitting: Family Medicine

## 2010-11-04 ENCOUNTER — Encounter (HOSPITAL_BASED_OUTPATIENT_CLINIC_OR_DEPARTMENT_OTHER): Payer: Medicare Other | Admitting: Oncology

## 2010-11-04 ENCOUNTER — Encounter (HOSPITAL_COMMUNITY)
Admission: RE | Admit: 2010-11-04 | Discharge: 2010-11-04 | Disposition: A | Discharge status: Home / Self Care | Payer: Medicare Other | Source: Ambulatory Visit | Attending: Obstetrics & Gynecology | Admitting: Obstetrics & Gynecology

## 2010-11-04 DIAGNOSIS — Z170 Estrogen receptor positive status [ER+]: Secondary | ICD-10-CM

## 2010-11-04 DIAGNOSIS — C50419 Malignant neoplasm of upper-outer quadrant of unspecified female breast: Secondary | ICD-10-CM

## 2010-11-04 LAB — CBC
MCHC: 32.7 g/dL (ref 30.0–36.0)
Platelets: 142 10*3/uL — ABNORMAL LOW (ref 150–400)
RDW: 14.5 % (ref 11.5–15.5)
WBC: 5.4 10*3/uL (ref 4.0–10.5)

## 2010-11-05 ENCOUNTER — Encounter: Payer: Self-pay | Admitting: Family Medicine

## 2010-11-05 ENCOUNTER — Ambulatory Visit (INDEPENDENT_AMBULATORY_CARE_PROVIDER_SITE_OTHER): Payer: Medicare Other | Admitting: Family Medicine

## 2010-11-05 DIAGNOSIS — Z0000 Encounter for general adult medical examination without abnormal findings: Secondary | ICD-10-CM

## 2010-11-05 DIAGNOSIS — C50919 Malignant neoplasm of unspecified site of unspecified female breast: Secondary | ICD-10-CM

## 2010-11-05 DIAGNOSIS — N814 Uterovaginal prolapse, unspecified: Secondary | ICD-10-CM

## 2010-11-05 DIAGNOSIS — E785 Hyperlipidemia, unspecified: Secondary | ICD-10-CM

## 2010-11-05 DIAGNOSIS — I10 Essential (primary) hypertension: Secondary | ICD-10-CM

## 2010-11-05 MED ORDER — ROSUVASTATIN CALCIUM 10 MG PO TABS: 10.0000 mg | ORAL_TABLET | Freq: Every day | ORAL | Status: DC

## 2010-11-05 NOTE — Progress Notes (Signed)
Subjective:    Patient ID: Darlene Lawrence, female    DOB: 1941-05-07, 70 y.o.   MRN: [id number]  HPI  I have personally reviewed the Medicare Annual Wellness questionnaire and have noted 1. The patient's medical and social history 2. Their use of alcohol, tobacco or illicit drugs 3. Their current medications and supplements 4. The patient's functional ability including ADL's, fall risks, home safety risks and hearing or visual             impairment. 5. Diet and physical activities 6. Evidence for depression or mood disorders The patients weight, height, BMI and visual acuity have been recorded in the chart I have made referrals, counseling and provided education to the patient based review of the above and I have provided the pt with a written personalized care plan for preventive services.  Hypertension:   Well controlled on current meds.   Using medication without problems or lightheadedness:  Chest pain with exertion:None Edema:None Short of breath:None Average home BPs:120/70   Elevated Cholesterol: On crestor 10 mg daily. Using medications without problems:NONE Muscle aches: None Other complaints:  Breast Cancer: Sees Dr. Donnie Coffin. Breast exam was normal.  No further testing.  Holding tamoxifen until after D & C.   Referred to Dr. Marice Potter for uterine prolapse , vaginal exam done there. Ultrasound inconclusive. Tried in office biopsy but was unable to get past cercix. Plans D & C.  Hip Replacement in last year. Dr. Despina Hick. Recovering very well.  Pain resolved, moving better, able to sleep beter.  Review of Systems  Constitutional: Negative for fever and fatigue.  HENT: Negative for ear pain.   Eyes: Negative for pain.  Respiratory: Negative for chest tightness and shortness of breath.   Cardiovascular: Negative for chest pain, palpitations and leg swelling.  Gastrointestinal: Negative for abdominal pain.  Genitourinary: Negative for dysuria.       Objective:     Physical Exam  Constitutional: Vital signs are normal. She appears well-developed and well-nourished. She is cooperative.  Non-toxic appearance. She does not appear ill. No distress.  HENT:  Head: Normocephalic.  Right Ear: Hearing, tympanic membrane, external ear and ear canal normal.  Left Ear: Hearing, tympanic membrane, external ear and ear canal normal.  Nose: Nose normal.  Eyes: Conjunctivae, EOM and lids are normal. Pupils are equal, round, and reactive to light. No foreign bodies found.  Neck: Trachea normal and normal range of motion. Neck supple. Carotid bruit is not present. No mass and no thyromegaly present.  Cardiovascular: Normal rate, regular rhythm, S1 normal, S2 normal, normal heart sounds and intact distal pulses.  Exam reveals no gallop.   No murmur heard. Pulmonary/Chest: Effort normal and breath sounds normal. No respiratory distress. She has no wheezes. She has no rhonchi. She has no rales.  Abdominal: Soft. Normal appearance and bowel sounds are normal. She exhibits no distension, no fluid wave, no abdominal bruit and no mass. There is no hepatosplenomegaly. There is no tenderness. There is no rebound, no guarding and no CVA tenderness. No hernia.  Genitourinary: Pelvic exam was performed with patient prone.       Per Dr. Marice Potter  Lymphadenopathy:    She has no cervical adenopathy.    She has no axillary adenopathy.  Neurological: She is alert. She has normal strength. No cranial nerve deficit or sensory deficit.  Skin: Skin is warm, dry and intact. No rash noted.  Psychiatric: Her speech is normal and behavior is normal. Judgment normal. Her mood  appears not anxious. Cognition and memory are normal. She does not exhibit a depressed mood.          Assessment & Plan:

## 2010-11-05 NOTE — Assessment & Plan Note (Signed)
Well controlled. Continue current medication.  

## 2010-11-05 NOTE — Assessment & Plan Note (Signed)
Well controlled. Continue current medication. Recheck in 1 year.

## 2010-11-05 NOTE — Assessment & Plan Note (Signed)
Eval and work up in process for this and other GYN issues.

## 2010-11-05 NOTE — Assessment & Plan Note (Signed)
Stable per Dr. Donnie Coffin

## 2010-11-05 NOTE — Patient Instructions (Signed)
Continue great work on diet, exercise and weight loss.

## 2010-11-06 ENCOUNTER — Ambulatory Visit (HOSPITAL_COMMUNITY)
Admission: RE | Admit: 2010-11-06 | Discharge: 2010-11-06 | Disposition: A | Discharge status: Home / Self Care | Payer: Medicare Other | Source: Ambulatory Visit | Attending: Obstetrics & Gynecology | Admitting: Obstetrics & Gynecology

## 2010-11-06 ENCOUNTER — Other Ambulatory Visit: Payer: Self-pay | Admitting: Obstetrics & Gynecology

## 2010-11-06 DIAGNOSIS — N950 Postmenopausal bleeding: Secondary | ICD-10-CM | POA: Insufficient documentation

## 2010-11-06 DIAGNOSIS — Z01818 Encounter for other preprocedural examination: Secondary | ICD-10-CM

## 2010-11-06 DIAGNOSIS — Z01812 Encounter for preprocedural laboratory examination: Secondary | ICD-10-CM

## 2010-11-06 DIAGNOSIS — Z853 Personal history of malignant neoplasm of breast: Secondary | ICD-10-CM

## 2010-11-13 NOTE — Op Note (Signed)
  Darlene Lawrence, Darlene Lawrence              ACCOUNT NO.:  [id number]  MEDICAL RECORD NO.:  [id number]  LOCATION:  [facility]                          [facility]  PHYSICIAN:  Allie Bossier, MD        DATE OF BIRTH:  June 07, 1940  DATE OF PROCEDURE:  11/06/2010 DATE OF DISCHARGE:                              OPERATIVE REPORT   PREOPERATIVE DIAGNOSIS:  Postmenopausal bleeding, breast cancer survivor, on tamoxifen.  PROCEDURE:  Dilation and Curettage.  SURGEON:  Lavance Beazer C. Marice Potter, MD  ANESTHESIA:  LMA plus local.  DETAILED PROCEDURE AND FINDINGS:  The risks, benefits, and alternatives of surgery were explained, understood and accepted.  Consents were signed.  Please note that [name] took 600 mg of Cytotec last night to help with cervical dilation today.  After consents were signed and all questions were answered, she was taken to the operating room, she was placed in dorsal lithotomy position and MAC anesthesia was applied. Her abdomen and vagina were prepped and draped in usual sterile fashion. Her bladder was emptied with a Robinson catheter.  Bimanual exam revealed a 12-week size uterus and nonenlarged adnexa with speculum.  On vaginal exam, she had a grade 4 cystocele, grade 2 uterine prolapse and no rectocele.  After retried various speculums, I was finally able to get an appropriately sized weighted speculum and Deaver to allow me visualization of her cervix.  The anterior lip of cervix was grasped with single-tooth tenaculum.  Paracervical block was utilized to 20 mL of 1% lidocaine.  Cervical os was still noted to be stenotic and I used extremely small (lacrimal duct dilator).  Dilators were used to gradually dilate the cervix.  Just as when I attempted a Pipelle biopsy, I was not able to pass any of the dilators up to about 4-5 cm mark. This was certainly not compatible with the length of her uterus measured on ultrasound at 11.5 cm nor with my bimanual exam, but I could not penetrate  the blockage without great worry about hemorrhage.  I did dilate enough to accommodate a small curette and I curetted the 4 cm which I have dilated.  A small amount of tissue was obtained.  No excessive bleeding was noted.  The tenaculum was removed and the tenaculum sites were noted to be hemostatic.  She was taken to the recovery room in stable condition.  The instrument, sponge, and needle counts were correct.  She tolerated the procedure well.     Allie Bossier, MD     MCD/MEDQ  D:  11/06/2010  T:  11/07/2010  Job:  [id number]  Electronically Signed by Nicholaus Bloom MD on 11/13/2010 08:04:17 AM

## 2010-11-19 ENCOUNTER — Encounter: Payer: Medicare Other | Admitting: Obstetrics & Gynecology

## 2010-11-21 ENCOUNTER — Ambulatory Visit: Payer: Medicare Other | Attending: Gynecologic Oncology | Admitting: Gynecologic Oncology

## 2010-11-21 DIAGNOSIS — Z96649 Presence of unspecified artificial hip joint: Secondary | ICD-10-CM | POA: Insufficient documentation

## 2010-11-21 DIAGNOSIS — Z923 Personal history of irradiation: Secondary | ICD-10-CM | POA: Insufficient documentation

## 2010-11-21 DIAGNOSIS — I10 Essential (primary) hypertension: Secondary | ICD-10-CM | POA: Insufficient documentation

## 2010-11-21 DIAGNOSIS — E785 Hyperlipidemia, unspecified: Secondary | ICD-10-CM | POA: Insufficient documentation

## 2010-11-21 DIAGNOSIS — Z803 Family history of malignant neoplasm of breast: Secondary | ICD-10-CM | POA: Insufficient documentation

## 2010-11-21 DIAGNOSIS — C50919 Malignant neoplasm of unspecified site of unspecified female breast: Secondary | ICD-10-CM | POA: Insufficient documentation

## 2010-11-21 DIAGNOSIS — Z800 Family history of malignant neoplasm of digestive organs: Secondary | ICD-10-CM | POA: Insufficient documentation

## 2010-11-21 DIAGNOSIS — N882 Stricture and stenosis of cervix uteri: Secondary | ICD-10-CM | POA: Insufficient documentation

## 2010-11-21 DIAGNOSIS — N939 Abnormal uterine and vaginal bleeding, unspecified: Secondary | ICD-10-CM | POA: Insufficient documentation

## 2010-11-21 DIAGNOSIS — N926 Irregular menstruation, unspecified: Secondary | ICD-10-CM | POA: Insufficient documentation

## 2010-11-21 DIAGNOSIS — Z79899 Other long term (current) drug therapy: Secondary | ICD-10-CM | POA: Insufficient documentation

## 2010-11-21 NOTE — Assessment & Plan Note (Signed)
Darlene Lawrence, Darlene Lawrence              ACCOUNT NO.:  [id number]  MEDICAL RECORD NO.:  [id number]           PATIENT TYPE:  O  LOCATION:  [facility]          FACILITY:  [facility]  PHYSICIAN:  Allie Bossier, MD        DATE OF BIRTH:  09/02/1940  DATE OF SERVICE:                                 CLINIC NOTE  Darlene Lawrence is a 70 year old married white G3 P2 A1 breast cancer survivor who was referred to me by Dr. Ermalene Searing because the patient was complaining of a cystocele that is causing her discomfort.  When I initially evaluated her, she told me that she has been having 6-8 months of a brownish vaginal discharge.  She has been on tamoxifen since 2009. On her pelvic exam, her uterus seemed to be enlarged and I evaluated her with an ultrasound which showed an uterus measuring 11.3 cm x 8.4 x 5.7 with 2 small fibroids.  The endometrium was "difficult" to visualize and they could not give an accurate measurement.  Her ovaries appeared normal.  I attempted an endometrial biopsy, but her cervix was rather stenotic and I was only able to pass the Pipelle biopsy up to 4 cm before it would bend each time.  PAST MEDICAL HISTORY:  Significant for history of right breast cancer in 2009, urge incontinence, fourth-degree cystocele, elevated lipids, and hypertension.  REVIEW OF SYSTEMS:  She moved from [place] in approximately 2008.  She had been married for 47 years.  She rarely has intercourse but when she does she denies dyspareunia.  Her Pap smear was done in June of this year and the result is not available.  Mammogram was in March of this year and it was normal and she had a colonoscopy in approximately 2009. She says that it was normal.  Dr. Donnie Coffin is her oncologist.  Dr. Ermalene Searing is her primary care.  PAST SURGICAL HISTORY:  She had a "cyst removed from her neck," laparoscopic tubal ligation, left hip replacement in February 2012, umbilical hernia repair, right lumpectomy followed by  radiation treatment.  ALLERGIES:  No latex allergies.  Drug allergies are PENICILLIN and SUPRAX.  MEDICATIONS: 1. Triamterene and hydrochlorothiazide 37.5/25 mg, she takes half a     tablet daily. 2. Crestor 10 mg daily. 3. Tamoxifen 20 mg daily. 4. Aspirin 81 mg daily. 5. Multiple vitamins daily.  FAMILY HISTORY:  Positive for breast cancer in her mom who was diagnosed at age 75 and her mother was also diagnosed with colon cancer in her 46s.  She denies family history of GYN cancers.  ASSESSMENT AND PLAN:  Postmenopausal bleeding for the last 8 months while on continuous tamoxifen therapy.  I am unable to do an endometrial biopsy and her ultrasound does not adequately assess her endometrium.  I will plan for a D and C.  Because her cervix is stenotic, I will treat her the night before for surgery with Cytotec by mouth.     Allie Bossier, MD    MCD/MEDQ  D:  10/31/2010  T:  11/01/2010  Job:  [id number]

## 2010-11-25 NOTE — Consult Note (Signed)
Darlene Lawrence, Darlene Lawrence              ACCOUNT NO.:  [id number]  MEDICAL RECORD NO.:  [id number]  LOCATION:  GYN                          FACILITY:  [facility]  PHYSICIAN:  Laurette Schimke, MD     DATE OF BIRTH:  02/27/1941  DATE OF CONSULTATION:  11/21/2010 DATE OF DISCHARGE:                                CONSULTATION   REFERRING PHYSICIAN:  Myra C. Marice Potter, MD  REASON FOR VISIT:  Consult was requested for abnormal uterine bleeding and cervical stenosis by Dr. Nicholaus Bloom.  HISTORY OF PRESENT ILLNESS:  This is a 70 year old gravida 3, para 2, last normal menstrual period in 48s.  Her history is notable for stage I breast cancer diagnosed and treated with lumpectomy and chest radiotherapy in May 2009.  She subsequently received tamoxifen for 5 years.  Darlene Lawrence states that she noted the onset of intermittent brown vaginal discharge in November 2011.  She brought this to Dr. Ellin Saba attention and underwent a transabdominal ultrasound.  The ultrasound was notable for uterine length of 11.3 cm.  Several cystic small foci were visible within the endometrial lining.  Fibroids were appreciated.  No endometrial stripe thickness was given.  The patient was noted to have cervical stenosis and received Cytotec and underwent an attempted operative dilatation and curettage.  The uterus could only be sounded to 4 cm despite a radiographic length of 11 cm.  In addition, the pathology returned with minute superficial endometrial epithelium and benign squamous mucosa.  PAST MEDICAL HISTORY:  Hypertension for 8 years, hyperlipidemia for 8 years, stage IA breast cancer.  PAST SURGICAL HISTORY:  Right lumpectomy in May of 2009, left total hip replacement in February 2012, umbilical hernia repair in 2007, bilateral tubal ligation in 1980s, submandibular cyst resection in 1985.  PAST GYN HISTORY:  Menarche at age of 83 with regular menses until menopause.  History of abnormal Pap, the last abnormal Pap was  in 1980s.  FAMILY HISTORY:  Mother with colon cancer in her 80s and breast cancer diagnosed at age of 57.  She reports current colonic screening.  SOCIAL HISTORY:  She is married.  She denies tobacco use.  Reports rare alcohol use.  ALLERGIES:  SUPRAX that causes hives and PENICILLIN with an unknown response.  MEDICATIONS:  Triamterene hydrochlorothiazide 37.5/25 half a tablet daily, Crestor 10 mg 1 tablet 3 times a week, tamoxifen 20 mg 1 tablet daily, multivitamin, fish oil, SAM-e, calcium citrate, glucosamine and vitamin B-100 complex 1 tablet daily, aspirin 81 mg daily, vitamin D 2000 mg 1 tablet twice daily.  REVIEW OF SYSTEMS:  No shortness of breath, chest pain, headache, abdominal pain, early satiety, abdominal bloating, change in appetite, weight loss, fatigue.  No bleeding from the rectum, bladder or vagina. No extremity edema.  PHYSICAL EXAMINATION:  GENERAL:  Well-developed female, in no acute distress. VITAL SIGNS:  Weight 195 pounds, height 5 feet 6 inches, blood pressure 128/70, pulse of 74, temperature 98.4. CHEST:  Clear to auscultation. HEART:  Regular rate and rhythm. BACK:  No CVA tenderness. ABDOMEN:  Soft, nontender.  Laxity of the abdominal wall.  Umbilical hernia incision repair visible. PELVIC EXAMINATION:  Notable for cystocele to the level of  the introitus.  There is a small amount of cervical descensus.  The cervix is approximately 3 cm, mobile.  The uterus appears at least 10 cm. There is no nodularity in the cul-de-sac.  Uterus is mobile. EXTREMITIES:  No clubbing, cyanosis, or edema.  IMPRESSION:  Abnormal uterine bleeding, cervical stenosis.  Darlene Lawrence has been advised that an additional attempt to completely sample the uterus should be undertaken.  I proposed that Cytotec be administered intravaginally 24 hours prior to the scheduled D and C hysteroscopy.  If cervical dilatation is not possible, a cervical LEEP will be performed to get past  the obstruction.  The patient is aware the risks of this are infection, bleeding, uterine perforation, possibly requiring additional surgical procedures not excluding hysterectomy.  All of her questions were answered and the procedure will be scheduled.  All of her questions were answered that of her satisfaction.     Laurette Schimke, MD     WB/MEDQ  D:  11/21/2010  T:  11/21/2010  Job:  [id number]  cc:   Allie Bossier, MD  Telford Nab, R.N. [address] [place], [place] [address]  Electronically Signed by Laurette Schimke MD on 11/25/2010 10:17:24 AM

## 2010-11-28 ENCOUNTER — Encounter: Payer: Medicare Other | Admitting: Obstetrics & Gynecology

## 2010-12-02 ENCOUNTER — Other Ambulatory Visit: Payer: Self-pay | Admitting: Gynecology

## 2010-12-02 DIAGNOSIS — R9389 Abnormal findings on diagnostic imaging of other specified body structures: Secondary | ICD-10-CM

## 2010-12-13 ENCOUNTER — Other Ambulatory Visit: Payer: Self-pay | Admitting: Gynecology

## 2010-12-13 ENCOUNTER — Encounter (HOSPITAL_COMMUNITY): Payer: Medicare Other

## 2010-12-13 LAB — COMPREHENSIVE METABOLIC PANEL
AST: 20 U/L (ref 0–37)
Albumin: 3.3 g/dL — ABNORMAL LOW (ref 3.5–5.2)
Alkaline Phosphatase: 56 U/L (ref 39–117)
Chloride: 102 mEq/L (ref 96–112)
Potassium: 3.5 mEq/L (ref 3.5–5.1)
Sodium: 139 mEq/L (ref 135–145)
Total Bilirubin: 0.3 mg/dL (ref 0.3–1.2)
Total Protein: 7.5 g/dL (ref 6.0–8.3)

## 2010-12-13 LAB — CBC
Hemoglobin: 11.7 g/dL — ABNORMAL LOW (ref 12.0–15.0)
MCHC: 34.1 g/dL (ref 30.0–36.0)
Platelets: 151 10*3/uL (ref 150–400)
RDW: 14 % (ref 11.5–15.5)

## 2010-12-13 LAB — DIFFERENTIAL
Basophils Absolute: 0 10*3/uL (ref 0.0–0.1)
Basophils Relative: 0 % (ref 0–1)
Eosinophils Absolute: 0.2 10*3/uL (ref 0.0–0.7)
Neutro Abs: 2.7 10*3/uL (ref 1.7–7.7)
Neutrophils Relative %: 48 % (ref 43–77)

## 2010-12-17 ENCOUNTER — Ambulatory Visit (HOSPITAL_COMMUNITY)
Admission: RE | Admit: 2010-12-17 | Discharge: 2010-12-17 | Disposition: A | Discharge status: Home / Self Care | Payer: Medicare Other | Source: Ambulatory Visit | Attending: Gynecology | Admitting: Gynecology

## 2010-12-17 ENCOUNTER — Other Ambulatory Visit: Payer: Self-pay | Admitting: Gynecology

## 2010-12-17 DIAGNOSIS — I4510 Unspecified right bundle-branch block: Secondary | ICD-10-CM | POA: Insufficient documentation

## 2010-12-17 DIAGNOSIS — N950 Postmenopausal bleeding: Secondary | ICD-10-CM | POA: Insufficient documentation

## 2010-12-17 DIAGNOSIS — I10 Essential (primary) hypertension: Secondary | ICD-10-CM | POA: Insufficient documentation

## 2010-12-17 DIAGNOSIS — R9389 Abnormal findings on diagnostic imaging of other specified body structures: Secondary | ICD-10-CM

## 2010-12-17 DIAGNOSIS — N882 Stricture and stenosis of cervix uteri: Secondary | ICD-10-CM | POA: Insufficient documentation

## 2010-12-17 DIAGNOSIS — Z01812 Encounter for preprocedural laboratory examination: Secondary | ICD-10-CM | POA: Insufficient documentation

## 2010-12-19 NOTE — Op Note (Signed)
  Darlene Lawrence, Darlene Lawrence              ACCOUNT NO.:  [id number]  MEDICAL RECORD NO.:  [id number]  LOCATION:  [facility]                         [facility]ACILITY:  [facility]  PHYSICIAN:  De Blanch, M.D.DATE OF BIRTH:  1941-03-04  DATE OF PROCEDURE: DATE OF DISCHARGE:  12/17/2010                              OPERATIVE REPORT   PREOPERATIVE DIAGNOSIS:  Postmenopausal bleeding and cervical stenosis.  POSTOPERATIVE DIAGNOSIS:  Postmenopausal bleeding and cervical stenosis.  PROCEDURE:  Examination under anesthesia, D and C with ultrasound guidance.  SURGEON:  De Blanch, MD  ASSISTANT:  Telford Nab, RN  ANESTHESIA:  LMA.  SURGICAL FINDINGS:  Examination under anesthesia revealed a uterus which is slightly enlarged approximately 10 weeks' gestational size.  The patient had a large cystocele, rectocele, a patulous-appearing cervix. The uterus sounded to 10 cm anteriorly.  There was a slight amount of cervical stenosis which was easily overcome high in the endocervical canal.  There was minimal amount of endometrial and endocervical tissue obtained.  PROCEDURE IN DETAIL:  The patient was brought to the operating room and after satisfactory attainment of general anesthesia, was placed in modified lithotomy position in [place] stirrups.  She was examined.  The vulva and vagina were prepped with Betadine and the patient was draped. Weighted speculum was placed in posterior vagina, the anterior vagina and cystocele were elevated with a narrow Deaver and cervix was grasped with tenaculum.  With transabdominal ultrasound guidance, we identified the endocervical canal with a wire probe.  We then passed the probe easily up into the endometrial cavity.  The standard size uterine sound was then inserted under direct visualization with the ultrasound, again entering the endometrial cavity.  We then dilated the cervix to a 23 Pratt dilator.  Endocervical curettings were obtained and  then endometrial curettings were obtained and submitted as separate specimens.  Minimal amount of tissue was obtained.  The Randall stone forceps were then placed in the uterus again under direct visualization and no additional polyps or other tissue was removed.  The procedure was terminated as there was some bleeding from the cervix where a tenaculum had been placed and this was controlled with cautery. The patient was awakened from anesthesia, taken to the recovery room in satisfactory condition.  Sponge, needle, and instrument counts correct x2.     De Blanch, M.D.     DC/MEDQ  D:  12/17/2010  T:  12/18/2010  Job:  [id number]  cc:   Allie Bossier, MD  Telford Nab, R.N. [address] [place], [place] [address]  Electronically Signed by De Blanch M.D. on 12/19/2010 11:23:54 AM

## 2010-12-27 ENCOUNTER — Ambulatory Visit: Payer: Medicare Other | Attending: Gynecology | Admitting: Gynecology

## 2010-12-27 DIAGNOSIS — N8110 Cystocele, unspecified: Secondary | ICD-10-CM | POA: Insufficient documentation

## 2010-12-27 DIAGNOSIS — N859 Noninflammatory disorder of uterus, unspecified: Secondary | ICD-10-CM | POA: Insufficient documentation

## 2010-12-27 DIAGNOSIS — Z09 Encounter for follow-up examination after completed treatment for conditions other than malignant neoplasm: Secondary | ICD-10-CM | POA: Insufficient documentation

## 2010-12-27 DIAGNOSIS — N898 Other specified noninflammatory disorders of vagina: Secondary | ICD-10-CM | POA: Insufficient documentation

## 2010-12-27 DIAGNOSIS — N950 Postmenopausal bleeding: Secondary | ICD-10-CM | POA: Insufficient documentation

## 2010-12-30 NOTE — Consult Note (Signed)
  NAMEBRYNNLEIGH, Darlene Lawrence              ACCOUNT NO.:  [id number]  MEDICAL RECORD NO.:  [id number]  LOCATION:                                 FACILITY:  PHYSICIAN:  De Blanch, M.D.DATE OF BIRTH:  09-25-1940  DATE OF CONSULTATION: DATE OF DISCHARGE:                                CONSULTATION   CHIEF COMPLAINT:  Postoperative followup.  INTERVAL HISTORY:  The patient underwent a D and C on December 17, 2010. Final pathology showed atrophic endometrium with no evidence of hyperplasia or atypia.  Endocervical curettage was also negative.  The patient has had an uncomplicated postoperative course.  She denies any fever, chills or any pelvic pain.  She is not having any further bleeding, although she does have a clear vaginal discharge.  She now expresses concerns about vaginal prolapse, which she has had for some time.  She has no urinary incontinence, but does feel a bulge continually, which is uncomfortable.  PHYSICAL EXAMINATION:  VITAL SIGNS:  Weight 194 pounds, temperature 98. GENERAL:  The patient is a healthy white female, in no acute distress. ABDOMEN:  Soft, nontender.  No mass, organomegaly, ascites, or hernias noted. PELVIC:  EGBUS is normal, although it is noted that the anterior vagina protrudes past the introitus.  The vaginal mucosa is healthy with no lesions.  She does have a large cystocele and rectocele.  The cervix appears normal.  Bimanual exam reveals normal uterus.  No adnexal masses.  IMPRESSION:  Postmenopausal bleeding, status post dilation and curettage revealing atrophic endometrium.  These findings have been discussed with the patient and no further therapy is necessary.  With regard to her significant vaginal prolapse, we will refer her back to Dr. Marice Potter.  If Dr. Marice Potter does not wish to undertake extensive reconstructive surgery necessary with this distorted anatomy, I would certainly be happy to recommend a urogynecologist at [facility].     De Blanch, M.D.     DC/MEDQ  D:  12/27/2010  T:  12/27/2010  Job:  [id number]  cc:   Allie Bossier, MD  Telford Nab, R.N. [address] [place], [place] [address]  Electronically Signed by De Blanch M.D. on 12/30/2010 11:41:05 AM

## 2011-01-30 ENCOUNTER — Encounter: Payer: Self-pay | Admitting: Obstetrics & Gynecology

## 2011-01-30 ENCOUNTER — Ambulatory Visit (INDEPENDENT_AMBULATORY_CARE_PROVIDER_SITE_OTHER): Payer: Medicare Other | Admitting: Obstetrics & Gynecology

## 2011-01-30 VITALS — BP 126/83 | Ht 66.0 in | Wt 198.0 lb

## 2011-01-30 DIAGNOSIS — N952 Postmenopausal atrophic vaginitis: Secondary | ICD-10-CM

## 2011-01-30 DIAGNOSIS — IMO0002 Reserved for concepts with insufficient information to code with codable children: Secondary | ICD-10-CM

## 2011-01-30 DIAGNOSIS — N8111 Cystocele, midline: Secondary | ICD-10-CM

## 2011-01-30 MED ORDER — ESTRADIOL 2 MG VA RING: 2.0000 mg | VAGINAL_RING | VAGINAL | Status: AC

## 2011-01-30 NOTE — Progress Notes (Signed)
  Subjective:    Patient ID: CAROLEANN CASLER, female    DOB: August 07, 1940, 70 y.o.   MRN: [id number]  HPI  Ms. Longino has had a cystocele for years but feels that it is becoming more symptomatic since June 2012.  She describes the sensation of always feeling like she has to pee (she does have urge incontinence).  She denies GSUI.  Review of Systems     Objective:   Physical Exam   4th degree cystocele, no rectocele Marked atrophy (history or breast cancer)     Assessment & Plan:  Symptomatic cystocele I offered her a pessary or surgery. She opts for a pessary at this time.  I have explained that she will need some vaginal estrogen (NOT systemic) so that the pessary doesn't erode her atrophic epithelium.  She will come back for a fitting (I think she will need a #5,6,or7 ring).

## 2011-01-30 NOTE — Progress Notes (Signed)
Here to discuss dropped bladder.

## 2011-02-05 LAB — CBC
HCT: 37
Hemoglobin: 13
RBC: 3.78 — ABNORMAL LOW
WBC: 4.9

## 2011-02-05 LAB — PROTIME-INR: INR: 1

## 2011-02-05 LAB — DIFFERENTIAL
Basophils Absolute: 0
Basophils Relative: 1
Eosinophils Absolute: 0.2
Monocytes Absolute: 0.5
Neutro Abs: 2
Neutrophils Relative %: 42 — ABNORMAL LOW

## 2011-02-05 LAB — APTT: aPTT: 33

## 2011-02-05 LAB — COMPREHENSIVE METABOLIC PANEL
Alkaline Phosphatase: 55
BUN: 20
Chloride: 105
Glucose, Bld: 101 — ABNORMAL HIGH
Potassium: 4.3
Total Bilirubin: 0.6

## 2011-02-19 ENCOUNTER — Ambulatory Visit (INDEPENDENT_AMBULATORY_CARE_PROVIDER_SITE_OTHER): Payer: Medicare Other | Admitting: Obstetrics & Gynecology

## 2011-02-19 ENCOUNTER — Encounter: Payer: Self-pay | Admitting: Obstetrics & Gynecology

## 2011-02-19 VITALS — BP 118/77 | HR 64 | Ht 66.0 in | Wt 197.0 lb

## 2011-02-19 DIAGNOSIS — IMO0002 Reserved for concepts with insufficient information to code with codable children: Secondary | ICD-10-CM

## 2011-02-19 DIAGNOSIS — Z23 Encounter for immunization: Secondary | ICD-10-CM

## 2011-02-19 DIAGNOSIS — N8111 Cystocele, midline: Secondary | ICD-10-CM

## 2011-02-19 DIAGNOSIS — Z0000 Encounter for general adult medical examination without abnormal findings: Secondary | ICD-10-CM

## 2011-02-19 DIAGNOSIS — Z01419 Encounter for gynecological examination (general) (routine) without abnormal findings: Secondary | ICD-10-CM

## 2011-02-19 NOTE — Progress Notes (Signed)
  Subjective:    Patient ID: Darlene Lawrence, female    DOB: Jan 06, 1941, 70 y.o.   MRN: [id number]  HPI  She is here for a pessary fitting. Review of Systems    She would like her flu shot today. Objective:   Physical Exam  I tried #5 ,6,7 rings as well as a #2 1/2 donut. They all fell out when she would Valsalva on the toilet. Allergies  Allergen Reactions  . Cefixime     REACTION: Rash  . Penicillins     REACTION: Rash         Assessment & Plan:  4th degree cystocele. I will order more shapes of pessaries.  She will get her flu shot today.

## 2011-02-19 NOTE — Progress Notes (Signed)
Patient is here today to be fitted for a Pessary.

## 2011-03-20 ENCOUNTER — Ambulatory Visit (INDEPENDENT_AMBULATORY_CARE_PROVIDER_SITE_OTHER): Payer: Medicare Other | Admitting: Obstetrics & Gynecology

## 2011-03-20 VITALS — BP 130/70 | HR 60 | Wt 193.0 lb

## 2011-03-20 DIAGNOSIS — N8111 Cystocele, midline: Secondary | ICD-10-CM

## 2011-03-20 DIAGNOSIS — IMO0002 Reserved for concepts with insufficient information to code with codable children: Secondary | ICD-10-CM

## 2011-03-20 NOTE — Progress Notes (Signed)
  Subjective:    Patient ID: Darlene Lawrence, female    DOB: [birth year], 70 y.o.   MRN: [id number]  HPI Ms. Persinger is here again for a pessary fitting.  A 6.5 cm Gelhorn stayed in nicely when she did a Valsalva maneuver, but she was unable to insert or remove it.   Review of Systems She and her husband are sexually active, sometimes 2 times per week.    Objective:   Physical Exam        Assessment & Plan:  4th degree cystocele I will order a 5.5 cm Gelhorn and see if this will work for her. In the mean time, I have given her samples of Vagifem 10 mcg to be used 2 times per week.  I have also offered her a anterior repair with the caveat that I cannot guarantee that a cystocele will not recur.

## 2011-03-31 ENCOUNTER — Ambulatory Visit: Payer: Medicare Other | Admitting: Obstetrics & Gynecology

## 2011-04-07 ENCOUNTER — Ambulatory Visit (INDEPENDENT_AMBULATORY_CARE_PROVIDER_SITE_OTHER): Payer: Medicare Other | Admitting: Obstetrics & Gynecology

## 2011-04-07 ENCOUNTER — Encounter: Payer: Self-pay | Admitting: Obstetrics & Gynecology

## 2011-04-07 VITALS — BP 132/69 | HR 62 | Wt 197.0 lb

## 2011-04-07 DIAGNOSIS — N8111 Cystocele, midline: Secondary | ICD-10-CM

## 2011-04-07 DIAGNOSIS — IMO0002 Reserved for concepts with insufficient information to code with codable children: Secondary | ICD-10-CM

## 2011-04-07 NOTE — Progress Notes (Signed)
  Subjective:    Patient ID: FAYTH TREFRY, female    DOB: [birth year], 70 y.o.   MRN: [id number]  HPI  Ms. Petruzzi is here for another trial of pessary. A 5.5 cm Gelhorn has arrived.  It went in easily but fell out in the bathroom  Review of Systems   Her husband is having brain surgery for a benign tumor this Thursday.   Objective:   Physical Exam        Assessment & Plan:  We had a long discussion of sacrocolpopexy surgery and its risks and benefits.  She prefers to have the 7 cm Gelhorn placed today behind her Estring.  She will come back in a month for me to clean it.

## 2011-05-01 ENCOUNTER — Other Ambulatory Visit: Payer: Self-pay | Admitting: Oncology

## 2011-05-01 ENCOUNTER — Ambulatory Visit (INDEPENDENT_AMBULATORY_CARE_PROVIDER_SITE_OTHER): Payer: Medicare Other | Admitting: Obstetrics & Gynecology

## 2011-05-01 ENCOUNTER — Other Ambulatory Visit (HOSPITAL_BASED_OUTPATIENT_CLINIC_OR_DEPARTMENT_OTHER): Payer: Medicare Other | Admitting: Lab

## 2011-05-01 VITALS — BP 135/84 | HR 60 | Ht 66.0 in | Wt 199.0 lb

## 2011-05-01 DIAGNOSIS — Z170 Estrogen receptor positive status [ER+]: Secondary | ICD-10-CM

## 2011-05-01 DIAGNOSIS — N8111 Cystocele, midline: Secondary | ICD-10-CM

## 2011-05-01 DIAGNOSIS — C50419 Malignant neoplasm of upper-outer quadrant of unspecified female breast: Secondary | ICD-10-CM

## 2011-05-01 DIAGNOSIS — IMO0002 Reserved for concepts with insufficient information to code with codable children: Secondary | ICD-10-CM

## 2011-05-01 LAB — COMPREHENSIVE METABOLIC PANEL
ALT: 20 U/L (ref 0–35)
AST: 21 U/L (ref 0–37)
Alkaline Phosphatase: 58 U/L (ref 39–117)
BUN: 25 mg/dL — ABNORMAL HIGH (ref 6–23)
Chloride: 103 mEq/L (ref 96–112)
Creatinine, Ser: 1.09 mg/dL (ref 0.50–1.10)
Total Bilirubin: 0.2 mg/dL — ABNORMAL LOW (ref 0.3–1.2)

## 2011-05-01 LAB — CBC WITH DIFFERENTIAL/PLATELET
BASO%: 0.6 % (ref 0.0–2.0)
EOS%: 3.9 % (ref 0.0–7.0)
HCT: 36.8 % (ref 34.8–46.6)
MCH: 33.6 pg (ref 25.1–34.0)
MCHC: 34 g/dL (ref 31.5–36.0)
MCV: 98.9 fL (ref 79.5–101.0)
MONO%: 7.6 % (ref 0.0–14.0)
NEUT%: 50.8 % (ref 38.4–76.8)
lymph#: 1.9 10*3/uL (ref 0.9–3.3)

## 2011-05-01 NOTE — Progress Notes (Signed)
  Subjective:    Patient ID: Darlene Lawrence, female    DOB: April 11, 1941, 70 y.o.   MRN: [id number]  HPI  She has had in the Gelhorn pessary for the last month as well as the Estring. We now have in stock the 2 1/4 inch Gelhorn that I think will work better for her. She has been pleased that the prolapse symptoms have been resolved.  Review of Systems    mammogram due march and scheduled by her Primary care doctor Objective:   Physical Exam   3inch pessary removed. Her vaginal mucosa is in good shape with no erosions. She was able to place and remove the 2 1/4 inch Gelhorn. It stayed in and relieved her symptoms.     Assessment & Plan:  She will come back in June when her pap is due or sooner prn.

## 2011-05-02 LAB — VITAMIN D 25 HYDROXY (VIT D DEFICIENCY, FRACTURES): Vit D, 25-Hydroxy: 46 ng/mL (ref 30–89)

## 2011-05-08 ENCOUNTER — Encounter: Payer: Self-pay | Admitting: Obstetrics & Gynecology

## 2011-05-08 ENCOUNTER — Ambulatory Visit (HOSPITAL_BASED_OUTPATIENT_CLINIC_OR_DEPARTMENT_OTHER): Payer: Medicare Other | Admitting: Oncology

## 2011-05-08 ENCOUNTER — Telehealth: Payer: Self-pay | Admitting: *Deleted

## 2011-05-08 ENCOUNTER — Ambulatory Visit (INDEPENDENT_AMBULATORY_CARE_PROVIDER_SITE_OTHER): Payer: Medicare Other | Admitting: Obstetrics & Gynecology

## 2011-05-08 VITALS — BP 138/83 | HR 68 | Ht 66.0 in | Wt 198.0 lb

## 2011-05-08 VITALS — BP 137/77 | HR 75 | Temp 98.5°F | Ht 66.0 in | Wt 197.2 lb

## 2011-05-08 DIAGNOSIS — IMO0002 Reserved for concepts with insufficient information to code with codable children: Secondary | ICD-10-CM

## 2011-05-08 DIAGNOSIS — Z923 Personal history of irradiation: Secondary | ICD-10-CM

## 2011-05-08 DIAGNOSIS — N8111 Cystocele, midline: Secondary | ICD-10-CM

## 2011-05-08 DIAGNOSIS — C50919 Malignant neoplasm of unspecified site of unspecified female breast: Secondary | ICD-10-CM

## 2011-05-08 DIAGNOSIS — E559 Vitamin D deficiency, unspecified: Secondary | ICD-10-CM

## 2011-05-08 DIAGNOSIS — Z170 Estrogen receptor positive status [ER+]: Secondary | ICD-10-CM

## 2011-05-08 DIAGNOSIS — Z79810 Long term (current) use of selective estrogen receptor modulators (SERMs): Secondary | ICD-10-CM

## 2011-05-08 NOTE — Telephone Encounter (Signed)
gave patient appointment for 10-2011 made  patient appointment for mammogram in 08-2011

## 2011-05-08 NOTE — Progress Notes (Signed)
Hematology and Oncology Follow Up Visit  Darlene Lawrence [id number] 1941/02/02 70 y.o. 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  She completed radiation therapy in September 2009, previously on Femara for about 3 months now on tamoxifen 20 mg p.o. daily.  Interim History:  There have been no intercurrent illness, hospitalizations or medication changes. D+C 9/12-ve for malignancy, now has pessary , and e-string. X 1 month. Feels good, no further discharge.  Medications: I have reviewed the patient's current medications.  Allergies:  Allergies  Allergen Reactions  . Cefixime     REACTION: Rash  . Penicillins     REACTION: Rash    Past Medical History, Surgical history, Social history, and Family History were reviewed and updated.  Review of Systems: Constitutional:  Negative for fever, chills, night sweats, anorexia, weight loss, pain. Cardiovascular: no chest pain or dyspnea on exertion Respiratory: no cough, shortness of breath, or wheezing Neurological: no TIA or stroke symptoms Dermatological: negative ENT: negative Skin Gastrointestinal: no abdominal pain, change in bowel habits, or black or bloody stools Genito-Urinary: no dysuria, trouble voiding, or hematuria Hematological and Lymphatic: negative Breast: negative for breast lumps Musculoskeletal: negative Remaining ROS negative.  Physical Exam: Blood pressure 137/77, pulse 75, temperature 98.5 F (36.9 C), height 5\' 6"  (1.676 m), weight 197 lb 3.2 oz (89.449 kg). ECOG:  General appearance: alert, cooperative, appears stated age and combative Head: Normocephalic, without obvious abnormality, atraumatic Neck: no adenopathy, no carotid  bruit, no JVD, supple, symmetrical, trachea midline and thyroid not enlarged, symmetric, no tenderness/mass/nodules Lymph nodes: Cervical, supraclavicular, and axillary nodes normal. Cardiac : regular rate and rhythm, no murmurs or gallops Pulmonary:clear to auscultation bilaterally and normal percussion bilaterally Breasts: negative Abdomen:soft, non-tender; bowel sounds normal; no masses,  no organomegaly Extremities negative Neuro: alert, oriented, normal speech, no focal findings or movement disorder noted  Lab Results: Lab Results  Component Value Date   WBC 5.2 05/01/2011   HGB 12.5 05/01/2011   HCT 36.8 05/01/2011   MCV 98.9 05/01/2011   PLT 148 05/01/2011     Chemistry      Component Value Date/Time   NA 141 05/01/2011 1603   K 3.6 05/01/2011 1603   CL 103 05/01/2011 1603   CO2 29 05/01/2011 1603   BUN 25* 05/01/2011 1603   CREATININE 1.09 05/01/2011 1603      Component Value Date/Time   CALCIUM 9.8 05/01/2011 1603   ALKPHOS 58 05/01/2011 1603   AST 21 05/01/2011 1603   ALT 20 05/01/2011 1603   BILITOT 0.2* 05/01/2011 1603      .pathology. Radiological Studies: chest X-ray n/a Mammogram 4/12- wnl Bone density 2011- wnl  Impression and Plan: 70 yo post menopausal woman  with hx of er+, breast cancer on tamoxifen. We discussed pros and cons of estring, this apparently being used for improvement and retention of pessary.  More than 50% of the visit was spent in patient-related counselling   Pierce Crane, MD 12/27/201212:30 PM

## 2011-05-08 NOTE — Progress Notes (Signed)
  Subjective:    Patient ID: Darlene Lawrence, female    DOB: April 24, 1941, 70 y.o.   MRN: [id number]  HPI  Darlene Lawrence is here because she is unable to remove her pessary at home (She was able to do it here on the day of insertion).She would like to go back and use the 75 mm ring (that previously fell out with Valsalva on the toilet.)  Review of Systems     Objective:   Physical Exam   I removed the Gelhorn and placed the 75 mm ring.     Assessment & Plan:  She will RTC for her annual or prn sooner.  I have recommended that this is the last pessary we try and if it doesn't work, that she consider a robotic sacrospinous fixation.

## 2011-05-14 DIAGNOSIS — M9981 Other biomechanical lesions of cervical region: Secondary | ICD-10-CM | POA: Diagnosis not present

## 2011-05-14 DIAGNOSIS — M999 Biomechanical lesion, unspecified: Secondary | ICD-10-CM | POA: Diagnosis not present

## 2011-05-14 DIAGNOSIS — M5030 Other cervical disc degeneration, unspecified cervical region: Secondary | ICD-10-CM | POA: Diagnosis not present

## 2011-05-14 DIAGNOSIS — M47814 Spondylosis without myelopathy or radiculopathy, thoracic region: Secondary | ICD-10-CM | POA: Diagnosis not present

## 2011-06-07 ENCOUNTER — Other Ambulatory Visit: Payer: Self-pay | Admitting: Family Medicine

## 2011-06-11 DIAGNOSIS — M9981 Other biomechanical lesions of cervical region: Secondary | ICD-10-CM | POA: Diagnosis not present

## 2011-06-11 DIAGNOSIS — M5030 Other cervical disc degeneration, unspecified cervical region: Secondary | ICD-10-CM | POA: Diagnosis not present

## 2011-06-11 DIAGNOSIS — M999 Biomechanical lesion, unspecified: Secondary | ICD-10-CM | POA: Diagnosis not present

## 2011-06-11 DIAGNOSIS — M47814 Spondylosis without myelopathy or radiculopathy, thoracic region: Secondary | ICD-10-CM | POA: Diagnosis not present

## 2011-07-01 DIAGNOSIS — M169 Osteoarthritis of hip, unspecified: Secondary | ICD-10-CM | POA: Diagnosis not present

## 2011-07-09 DIAGNOSIS — M9981 Other biomechanical lesions of cervical region: Secondary | ICD-10-CM | POA: Diagnosis not present

## 2011-07-09 DIAGNOSIS — M999 Biomechanical lesion, unspecified: Secondary | ICD-10-CM | POA: Diagnosis not present

## 2011-07-09 DIAGNOSIS — M5030 Other cervical disc degeneration, unspecified cervical region: Secondary | ICD-10-CM | POA: Diagnosis not present

## 2011-07-09 DIAGNOSIS — M47814 Spondylosis without myelopathy or radiculopathy, thoracic region: Secondary | ICD-10-CM | POA: Diagnosis not present

## 2011-08-06 DIAGNOSIS — M47814 Spondylosis without myelopathy or radiculopathy, thoracic region: Secondary | ICD-10-CM | POA: Diagnosis not present

## 2011-08-06 DIAGNOSIS — M999 Biomechanical lesion, unspecified: Secondary | ICD-10-CM | POA: Diagnosis not present

## 2011-08-06 DIAGNOSIS — M5030 Other cervical disc degeneration, unspecified cervical region: Secondary | ICD-10-CM | POA: Diagnosis not present

## 2011-08-06 DIAGNOSIS — M9981 Other biomechanical lesions of cervical region: Secondary | ICD-10-CM | POA: Diagnosis not present

## 2011-08-14 ENCOUNTER — Ambulatory Visit
Admission: RE | Admit: 2011-08-14 | Discharge: 2011-08-14 | Disposition: A | Discharge status: Home / Self Care | Payer: Medicare Other | Source: Ambulatory Visit | Attending: Oncology | Admitting: Oncology

## 2011-08-14 DIAGNOSIS — E559 Vitamin D deficiency, unspecified: Secondary | ICD-10-CM

## 2011-08-14 DIAGNOSIS — Z853 Personal history of malignant neoplasm of breast: Secondary | ICD-10-CM | POA: Diagnosis not present

## 2011-08-14 DIAGNOSIS — C50919 Malignant neoplasm of unspecified site of unspecified female breast: Secondary | ICD-10-CM

## 2011-09-03 DIAGNOSIS — M999 Biomechanical lesion, unspecified: Secondary | ICD-10-CM | POA: Diagnosis not present

## 2011-09-03 DIAGNOSIS — M47814 Spondylosis without myelopathy or radiculopathy, thoracic region: Secondary | ICD-10-CM | POA: Diagnosis not present

## 2011-09-03 DIAGNOSIS — M5030 Other cervical disc degeneration, unspecified cervical region: Secondary | ICD-10-CM | POA: Diagnosis not present

## 2011-09-03 DIAGNOSIS — M9981 Other biomechanical lesions of cervical region: Secondary | ICD-10-CM | POA: Diagnosis not present

## 2011-09-09 ENCOUNTER — Other Ambulatory Visit: Payer: Self-pay | Admitting: Family Medicine

## 2011-10-07 ENCOUNTER — Telehealth: Payer: Self-pay | Admitting: *Deleted

## 2011-10-07 NOTE — Telephone Encounter (Signed)
cancelled 11-06-2011 and 10-30-2011 gave patient appointment for 10-13-2011 lab only 10-15-2011 at 4:30pm  per orders patient is aware of the new date and time

## 2011-10-07 NOTE — Telephone Encounter (Signed)
Pt reports that she is having discoloration on the post surgical breast, there is no warmth, swelling, or discomfort. Pt states that she is to see MD on 6/27th however, she would like an earlier appt. Both labs and MD appt have been moved to earlier dates. Schedulers have been notified

## 2011-10-08 DIAGNOSIS — M9981 Other biomechanical lesions of cervical region: Secondary | ICD-10-CM | POA: Diagnosis not present

## 2011-10-08 DIAGNOSIS — M47814 Spondylosis without myelopathy or radiculopathy, thoracic region: Secondary | ICD-10-CM | POA: Diagnosis not present

## 2011-10-08 DIAGNOSIS — M999 Biomechanical lesion, unspecified: Secondary | ICD-10-CM | POA: Diagnosis not present

## 2011-10-08 DIAGNOSIS — M5030 Other cervical disc degeneration, unspecified cervical region: Secondary | ICD-10-CM | POA: Diagnosis not present

## 2011-10-13 ENCOUNTER — Other Ambulatory Visit (HOSPITAL_BASED_OUTPATIENT_CLINIC_OR_DEPARTMENT_OTHER): Payer: Medicare Other | Admitting: Lab

## 2011-10-13 DIAGNOSIS — C50919 Malignant neoplasm of unspecified site of unspecified female breast: Secondary | ICD-10-CM | POA: Diagnosis not present

## 2011-10-13 DIAGNOSIS — E559 Vitamin D deficiency, unspecified: Secondary | ICD-10-CM

## 2011-10-13 LAB — COMPREHENSIVE METABOLIC PANEL
ALT: 22 U/L (ref 0–35)
Albumin: 3.2 g/dL — ABNORMAL LOW (ref 3.5–5.2)
CO2: 28 mEq/L (ref 19–32)
Chloride: 104 mEq/L (ref 96–112)
Glucose, Bld: 115 mg/dL — ABNORMAL HIGH (ref 70–99)
Potassium: 3.8 mEq/L (ref 3.5–5.3)
Sodium: 140 mEq/L (ref 135–145)
Total Protein: 7.2 g/dL (ref 6.0–8.3)

## 2011-10-14 LAB — VITAMIN D 25 HYDROXY (VIT D DEFICIENCY, FRACTURES): Vit D, 25-Hydroxy: 47 ng/mL (ref 30–89)

## 2011-10-15 ENCOUNTER — Ambulatory Visit (HOSPITAL_BASED_OUTPATIENT_CLINIC_OR_DEPARTMENT_OTHER): Payer: Medicare Other | Admitting: Oncology

## 2011-10-15 VITALS — BP 138/85 | HR 71 | Temp 98.4°F | Ht 66.0 in | Wt 202.8 lb

## 2011-10-15 DIAGNOSIS — Z170 Estrogen receptor positive status [ER+]: Secondary | ICD-10-CM

## 2011-10-15 DIAGNOSIS — C50419 Malignant neoplasm of upper-outer quadrant of unspecified female breast: Secondary | ICD-10-CM

## 2011-10-15 DIAGNOSIS — C50919 Malignant neoplasm of unspecified site of unspecified female breast: Secondary | ICD-10-CM

## 2011-10-15 DIAGNOSIS — E559 Vitamin D deficiency, unspecified: Secondary | ICD-10-CM

## 2011-10-16 ENCOUNTER — Other Ambulatory Visit: Payer: Self-pay | Admitting: Medical Oncology

## 2011-10-30 ENCOUNTER — Other Ambulatory Visit: Payer: Medicare Other | Admitting: Lab

## 2011-10-31 ENCOUNTER — Telehealth: Payer: Self-pay | Admitting: Family Medicine

## 2011-10-31 DIAGNOSIS — E785 Hyperlipidemia, unspecified: Secondary | ICD-10-CM

## 2011-10-31 NOTE — Telephone Encounter (Signed)
Message copied by Excell Seltzer on Fri Oct 31, 2011  5:11 PM ------      Message from: Alvina Chou      Created: Wed Oct 29, 2011  3:18 PM       She has orders from Dec, please note she is coming in for cpx labs, thanks, T

## 2011-11-03 ENCOUNTER — Other Ambulatory Visit (INDEPENDENT_AMBULATORY_CARE_PROVIDER_SITE_OTHER): Payer: Medicare Other

## 2011-11-03 DIAGNOSIS — E785 Hyperlipidemia, unspecified: Secondary | ICD-10-CM

## 2011-11-03 DIAGNOSIS — E559 Vitamin D deficiency, unspecified: Secondary | ICD-10-CM

## 2011-11-03 DIAGNOSIS — C50919 Malignant neoplasm of unspecified site of unspecified female breast: Secondary | ICD-10-CM

## 2011-11-03 LAB — LIPID PANEL: Cholesterol: 113 mg/dL (ref 0–200)

## 2011-11-03 NOTE — Addendum Note (Signed)
Addended by: Liane Comber C on: 11/03/2011 11:28 AM   Modules accepted: Orders

## 2011-11-04 ENCOUNTER — Ambulatory Visit (INDEPENDENT_AMBULATORY_CARE_PROVIDER_SITE_OTHER): Payer: Medicare Other | Admitting: Obstetrics & Gynecology

## 2011-11-04 ENCOUNTER — Encounter: Payer: Self-pay | Admitting: Obstetrics & Gynecology

## 2011-11-04 VITALS — BP 137/80 | HR 53 | Ht 66.0 in | Wt 196.0 lb

## 2011-11-04 DIAGNOSIS — Z0000 Encounter for general adult medical examination without abnormal findings: Secondary | ICD-10-CM

## 2011-11-04 LAB — VITAMIN D 25 HYDROXY (VIT D DEFICIENCY, FRACTURES): Vit D, 25-Hydroxy: 56 ng/mL (ref 30–89)

## 2011-11-04 NOTE — Addendum Note (Signed)
Addended by: Allie Bossier on: 11/04/2011 10:55 AM   Modules accepted: Orders

## 2011-11-04 NOTE — Progress Notes (Signed)
Subjective:    Darlene Lawrence is a 71 y.o. female who presents for an annual exam. The patient has no complaints today except the cost of the Estring and her oncologist would like a lower dose of vaginal estrogen. The patient is sexually active. GYN screening history: last pap: was normal. The patient wears seatbelts: yes. The patient participates in regular exercise: yes. Has the patient ever been transfused or tattooed?: no. The patient reports that there is not domestic violence in her life.   Menstrual History: OB History    Grav Para Term Preterm Abortions TAB SAB Ect Mult Living   3 2   1  1   2       Menarche age: 67 No LMP recorded. Patient is postmenopausal.    The following portions of the patient's history were reviewed and updated as appropriate: allergies, current medications, past family history, past medical history, past social history, past surgical history and problem list.  Review of Systems A comprehensive review of systems was negative.  Her mammogram with Dr. Donnie Coffin was normal in April 2013. She has some GSUI but says it's something she can deal with.   Objective:    BP 137/80  Pulse 53  Ht 5\' 6"  (1.676 m)  Wt 196 lb (88.905 kg)  BMI 31.64 kg/m2  General Appearance:    Alert, cooperative, no distress, appears stated age  Head:    Normocephalic, without obvious abnormality, atraumatic  Eyes:    PERRL, conjunctiva/corneas clear, EOM's intact, fundi    benign, both eyes  Ears:    Normal TM's and external ear canals, both ears  Nose:   Nares normal, septum midline, mucosa normal, no drainage    or sinus tenderness  Throat:   Lips, mucosa, and tongue normal; teeth and gums normal  Neck:   Supple, symmetrical, trachea midline, no adenopathy;    thyroid:  no enlargement/tenderness/nodules; no carotid   bruit or JVD  Back:     Symmetric, no curvature, ROM normal, no CVA tenderness  Lungs:     Clear to auscultation bilaterally, respirations unlabored  Chest Wall:     No tenderness or deformity   Heart:    Regular rate and rhythm, S1 and S2 normal, no murmur, rub   or gallop  Breast Exam:    No tenderness, masses, or nipple abnormality  Abdomen:     Soft, non-tender, bowel sounds active all four quadrants,    no masses, no organomegaly  Genitalia:    Normal female without lesion, discharge or tenderness, mild atrophy, normal vaginal vault, 8 week uterus, mobile with 3rd degree prolapse, no adnexal masses     Extremities:   Extremities normal, atraumatic, no cyanosis or edema  Pulses:   2+ and symmetric all extremities  Skin:   Skin color, texture, turgor normal, no rashes or lesions  Lymph nodes:   Cervical, supraclavicular, and axillary nodes normal  Neurologic:   CNII-XII intact, normal strength, sensation and reflexes    throughout  .    Assessment:    Healthy female exam.    Plan:     Pap smear.  She will remove her Estring at night when she removes her pessary.

## 2011-11-06 ENCOUNTER — Ambulatory Visit: Payer: Medicare Other | Admitting: Oncology

## 2011-11-06 ENCOUNTER — Encounter: Payer: Medicare Other | Admitting: Family Medicine

## 2011-11-06 DIAGNOSIS — M999 Biomechanical lesion, unspecified: Secondary | ICD-10-CM | POA: Diagnosis not present

## 2011-11-06 DIAGNOSIS — M5030 Other cervical disc degeneration, unspecified cervical region: Secondary | ICD-10-CM | POA: Diagnosis not present

## 2011-11-06 DIAGNOSIS — M9981 Other biomechanical lesions of cervical region: Secondary | ICD-10-CM | POA: Diagnosis not present

## 2011-11-06 DIAGNOSIS — M47814 Spondylosis without myelopathy or radiculopathy, thoracic region: Secondary | ICD-10-CM | POA: Diagnosis not present

## 2011-11-11 ENCOUNTER — Encounter: Payer: Self-pay | Admitting: Family Medicine

## 2011-11-11 ENCOUNTER — Ambulatory Visit (INDEPENDENT_AMBULATORY_CARE_PROVIDER_SITE_OTHER): Payer: Medicare Other | Admitting: Family Medicine

## 2011-11-11 VITALS — BP 110/78 | HR 57 | Temp 98.6°F | Ht 66.0 in | Wt 195.0 lb

## 2011-11-11 DIAGNOSIS — R5381 Other malaise: Secondary | ICD-10-CM | POA: Diagnosis not present

## 2011-11-11 DIAGNOSIS — N814 Uterovaginal prolapse, unspecified: Secondary | ICD-10-CM | POA: Diagnosis not present

## 2011-11-11 DIAGNOSIS — I10 Essential (primary) hypertension: Secondary | ICD-10-CM

## 2011-11-11 DIAGNOSIS — E785 Hyperlipidemia, unspecified: Secondary | ICD-10-CM

## 2011-11-11 DIAGNOSIS — R5383 Other fatigue: Secondary | ICD-10-CM

## 2011-11-11 DIAGNOSIS — Z0000 Encounter for general adult medical examination without abnormal findings: Secondary | ICD-10-CM

## 2011-11-11 DIAGNOSIS — Z23 Encounter for immunization: Secondary | ICD-10-CM | POA: Diagnosis not present

## 2011-11-11 DIAGNOSIS — Z780 Asymptomatic menopausal state: Secondary | ICD-10-CM

## 2011-11-11 NOTE — Patient Instructions (Addendum)
Can reduce crestor to 2 times a week... Return for recheck cholesterol in 6 month.  Stop at front desk to set up bone density. Continue great work on exercise, weight loss and healthy low fat/cholesterol diet.

## 2011-11-11 NOTE — Progress Notes (Signed)
I have personally reviewed the Medicare Annual Wellness questionnaire and have noted  1. The patient's medical and social history  2. Their use of alcohol, tobacco or illicit drugs  3. Their current medications and supplements  4. The patient's functional ability including ADL's, fall risks, home safety risks and hearing or visual  impairment.  5. Diet and physical activities  6. Evidence for depression or mood disorders  The patients weight, height, BMI and visual acuity have been recorded in the chart  I have made referrals, counseling and provided education to the patient based review of the above and I have provided the pt with a written personalized care plan for preventive services.   Hypertension: Well controlled on current meds.  Using medication without problems or lightheadedness:  Chest pain with exertion:None  Edema:None  Short of breath:None  Average home BPs:120/70  Wt Readings from Last 3 Encounters:  11/11/11 195 lb (88.451 kg)  11/04/11 196 lb (88.905 kg)  10/15/11 202 lb 12.8 oz (91.989 kg)     Elevated Cholesterol: LDL at goal <130 on crestor 10 mg three times a week..  Lab Results  Component Value Date   CHOL 113 11/03/2011   HDL 41.90 11/03/2011   LDLCALC 48 11/03/2011   TRIG 115.0 11/03/2011   CHOLHDL 3 11/03/2011    Using medications without problems: None Muscle aches: None  Other complaints:   Breast Cancer: Sees Dr. Donnie Coffin.  Breast exam was normal.  No further testing.   Hip Replacement in 2012. Dr. Despina Hick.  Recovering very well.  Pain resolved, moving better, able to sleep beter.   Review of Systems  Constitutional: Negative for fever and some  Fatigue ongoing for a while.  HENT: Negative for ear pain.  Eyes: Negative for pain.  Respiratory: Negative for chest tightness and shortness of breath.  Cardiovascular: Negative for chest pain, palpitations and leg swelling.  Gastrointestinal: Negative for abdominal pain.  No blood in  stool. Genitourinary: Negative for dysuria.    Objective:   Physical Exam  Constitutional: Vital signs are normal. She appears well-developed and well-nourished. She is cooperative. Non-toxic appearance. She does not appear ill. No distress.  HENT:  Head: Normocephalic.  Right Ear: Hearing, tympanic membrane, external ear and ear canal normal.  Left Ear: Hearing, tympanic membrane, external ear and ear canal normal.  Nose: Nose normal.  Eyes: Conjunctivae, EOM and lids are normal. Pupils are equal, round, and reactive to light. No foreign bodies found.  Neck: Trachea normal and normal range of motion. Neck supple. Carotid bruit is not present. No mass and no thyromegaly present.  Cardiovascular: Normal rate, regular rhythm, S1 normal, S2 normal, normal heart sounds and intact distal pulses. Exam reveals no gallop.  No murmur heard.  Pulmonary/Chest: Effort normal and breath sounds normal. No respiratory distress. She has no wheezes. She has no rhonchi. She has no rales.  Abdominal: Soft. Normal appearance and bowel sounds are normal. She exhibits no distension, no fluid wave, no abdominal bruit and no mass. There is no hepatosplenomegaly. There is no tenderness. There is no rebound, no guarding and no CVA tenderness. No hernia.  Genitourinary: .  Per Dr. Marice Potter  Lymphadenopathy:  She has no cervical adenopathy.  She has no axillary adenopathy.  Neurological: She is alert. She has normal strength. No cranial nerve deficit or sensory deficit.  Skin: Skin is warm, dry and intact. No rash noted.  Psychiatric: Her speech is normal and behavior is normal. Judgment normal. Her mood appears  not anxious. Cognition and memory are normal. She does not exhibit a depressed mood.    Assessment & Plan:   The patient's preventative maintenance and recommended screening tests for an annual wellness exam were reviewed in full today. Brought up to date unless services declined.  Counselled on the  importance of diet, exercise, and its role in overall health and mortality. The patient's FH and SH was reviewed, including their home life, tobacco status, and drug and alcohol status.   Vaccines: Due for Td, uptodate with shingles and PNA.  PAP/DVE: Per Dr. Marice Potter. Dr Marice Potter also as her on pessary for uterine prolapse. Breast exam per Dr. Donnie Coffin  Colonoscopy:  Dr. Matthias Hughs Date: 11/03/2007  Next Due: 01/2013 Results: normal Nonsmoker DEXA: last normal in 2011   HYPERLIPIDEMIA - Well controlled. Continue current medication.  Recheck in 1 year.  Hypertension: Well controlled. Continue current medication.   ADENOCARCINOMA, BREAST - Kerby Nora, MD 11/05/2010 12:18 PM Signed  Stable per Dr. Donnie Coffin

## 2011-11-11 NOTE — Assessment & Plan Note (Signed)
Can try to reduce crestor further... recheck in 6 months. Reduced for decreased cost not SE.

## 2011-11-11 NOTE — Assessment & Plan Note (Signed)
Well controlled. Continue current medication.  

## 2011-11-12 ENCOUNTER — Other Ambulatory Visit: Payer: Self-pay | Admitting: Family Medicine

## 2011-11-12 ENCOUNTER — Ambulatory Visit
Admission: RE | Admit: 2011-11-12 | Discharge: 2011-11-12 | Disposition: A | Discharge status: Home / Self Care | Payer: Medicare Other | Source: Ambulatory Visit | Attending: Family Medicine | Admitting: Family Medicine

## 2011-11-12 DIAGNOSIS — Z13820 Encounter for screening for osteoporosis: Secondary | ICD-10-CM | POA: Diagnosis not present

## 2011-11-12 DIAGNOSIS — Z780 Asymptomatic menopausal state: Secondary | ICD-10-CM | POA: Diagnosis not present

## 2011-12-03 DIAGNOSIS — M999 Biomechanical lesion, unspecified: Secondary | ICD-10-CM | POA: Diagnosis not present

## 2011-12-03 DIAGNOSIS — M5030 Other cervical disc degeneration, unspecified cervical region: Secondary | ICD-10-CM | POA: Diagnosis not present

## 2011-12-03 DIAGNOSIS — M9981 Other biomechanical lesions of cervical region: Secondary | ICD-10-CM | POA: Diagnosis not present

## 2011-12-03 DIAGNOSIS — M47814 Spondylosis without myelopathy or radiculopathy, thoracic region: Secondary | ICD-10-CM | POA: Diagnosis not present

## 2011-12-08 ENCOUNTER — Other Ambulatory Visit: Payer: Self-pay | Admitting: *Deleted

## 2011-12-08 MED ORDER — TRIAMTERENE-HCTZ 37.5-25 MG PO TABS: 0.5000 | ORAL_TABLET | Freq: Every day | ORAL | Status: DC

## 2011-12-29 DIAGNOSIS — M999 Biomechanical lesion, unspecified: Secondary | ICD-10-CM | POA: Diagnosis not present

## 2011-12-29 DIAGNOSIS — M47814 Spondylosis without myelopathy or radiculopathy, thoracic region: Secondary | ICD-10-CM | POA: Diagnosis not present

## 2011-12-29 DIAGNOSIS — M9981 Other biomechanical lesions of cervical region: Secondary | ICD-10-CM | POA: Diagnosis not present

## 2011-12-29 DIAGNOSIS — M5030 Other cervical disc degeneration, unspecified cervical region: Secondary | ICD-10-CM | POA: Diagnosis not present

## 2012-01-10 ENCOUNTER — Other Ambulatory Visit: Payer: Self-pay | Admitting: Physician Assistant

## 2012-01-10 DIAGNOSIS — C50919 Malignant neoplasm of unspecified site of unspecified female breast: Secondary | ICD-10-CM

## 2012-01-26 DIAGNOSIS — M5030 Other cervical disc degeneration, unspecified cervical region: Secondary | ICD-10-CM | POA: Diagnosis not present

## 2012-01-26 DIAGNOSIS — M47814 Spondylosis without myelopathy or radiculopathy, thoracic region: Secondary | ICD-10-CM | POA: Diagnosis not present

## 2012-01-26 DIAGNOSIS — M9981 Other biomechanical lesions of cervical region: Secondary | ICD-10-CM | POA: Diagnosis not present

## 2012-01-26 DIAGNOSIS — M999 Biomechanical lesion, unspecified: Secondary | ICD-10-CM | POA: Diagnosis not present

## 2012-02-23 DIAGNOSIS — M5030 Other cervical disc degeneration, unspecified cervical region: Secondary | ICD-10-CM | POA: Diagnosis not present

## 2012-02-23 DIAGNOSIS — Z23 Encounter for immunization: Secondary | ICD-10-CM | POA: Diagnosis not present

## 2012-02-23 DIAGNOSIS — M9981 Other biomechanical lesions of cervical region: Secondary | ICD-10-CM | POA: Diagnosis not present

## 2012-02-23 DIAGNOSIS — M47814 Spondylosis without myelopathy or radiculopathy, thoracic region: Secondary | ICD-10-CM | POA: Diagnosis not present

## 2012-02-23 DIAGNOSIS — M999 Biomechanical lesion, unspecified: Secondary | ICD-10-CM | POA: Diagnosis not present

## 2012-03-23 DIAGNOSIS — M9981 Other biomechanical lesions of cervical region: Secondary | ICD-10-CM | POA: Diagnosis not present

## 2012-03-23 DIAGNOSIS — M5030 Other cervical disc degeneration, unspecified cervical region: Secondary | ICD-10-CM | POA: Diagnosis not present

## 2012-03-23 DIAGNOSIS — M999 Biomechanical lesion, unspecified: Secondary | ICD-10-CM | POA: Diagnosis not present

## 2012-03-23 DIAGNOSIS — M47814 Spondylosis without myelopathy or radiculopathy, thoracic region: Secondary | ICD-10-CM | POA: Diagnosis not present

## 2012-04-07 ENCOUNTER — Other Ambulatory Visit: Payer: Self-pay | Admitting: *Deleted

## 2012-04-07 DIAGNOSIS — C50919 Malignant neoplasm of unspecified site of unspecified female breast: Secondary | ICD-10-CM

## 2012-04-09 ENCOUNTER — Other Ambulatory Visit (HOSPITAL_BASED_OUTPATIENT_CLINIC_OR_DEPARTMENT_OTHER): Payer: Medicare Other | Admitting: Lab

## 2012-04-09 DIAGNOSIS — C50919 Malignant neoplasm of unspecified site of unspecified female breast: Secondary | ICD-10-CM | POA: Diagnosis not present

## 2012-04-09 LAB — CBC WITH DIFFERENTIAL/PLATELET
BASO%: 0.5 % (ref 0.0–2.0)
LYMPH%: 35.9 % (ref 14.0–49.7)
MCHC: 33.7 g/dL (ref 31.5–36.0)
MONO#: 0.3 10*3/uL (ref 0.1–0.9)
MONO%: 7.2 % (ref 0.0–14.0)
Platelets: 154 10*3/uL (ref 145–400)
RBC: 3.72 10*6/uL (ref 3.70–5.45)
WBC: 4.3 10*3/uL (ref 3.9–10.3)

## 2012-04-09 LAB — CANCER ANTIGEN 27.29: CA 27.29: 12 U/mL (ref 0–39)

## 2012-04-09 LAB — COMPREHENSIVE METABOLIC PANEL (CC13)
ALT: 21 U/L (ref 0–55)
AST: 18 U/L (ref 5–34)
Chloride: 104 mEq/L (ref 98–107)
Creatinine: 1.1 mg/dL (ref 0.6–1.1)
Total Bilirubin: 0.3 mg/dL (ref 0.20–1.20)

## 2012-04-16 ENCOUNTER — Other Ambulatory Visit: Payer: Medicare Other | Admitting: Lab

## 2012-04-16 ENCOUNTER — Telehealth: Payer: Self-pay | Admitting: Oncology

## 2012-04-16 ENCOUNTER — Ambulatory Visit (HOSPITAL_BASED_OUTPATIENT_CLINIC_OR_DEPARTMENT_OTHER): Payer: Medicare Other | Admitting: Oncology

## 2012-04-16 VITALS — BP 121/78 | HR 68 | Temp 98.6°F | Resp 20 | Ht 66.0 in | Wt 193.7 lb

## 2012-04-16 DIAGNOSIS — C50419 Malignant neoplasm of upper-outer quadrant of unspecified female breast: Secondary | ICD-10-CM

## 2012-04-16 DIAGNOSIS — C50919 Malignant neoplasm of unspecified site of unspecified female breast: Secondary | ICD-10-CM

## 2012-04-16 NOTE — Progress Notes (Signed)
Hematology and Oncology Follow Up Visit  Darlene Lawrence [id number] August 15, 1940 71 y.o. 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  She completed radiation therapy in September 2009, previously on Femara for about 3 months now on tamoxifen 20 mg p.o. daily.  Interim History:  There have been no intercurrent illness, hospitalizations or medication changes. D+C 9/12-ve for malignancy, now has pessary , and e-string. X 1 month. Feels good, no further discharge.  Medications: I have reviewed the patient's current medications.  Allergies:  Allergies  Allergen Reactions  . Cefixime     REACTION: Rash  . Penicillins     REACTION: Rash    Past Medical History, Surgical history, Social history, and Family History were reviewed and updated.  Review of Systems: Constitutional:  Negative for fever, chills, night sweats, anorexia, weight loss, pain. Cardiovascular: no chest pain or dyspnea on exertion Respiratory: no cough, shortness of breath, or wheezing Neurological: no TIA or stroke symptoms Dermatological: negative ENT: negative Skin Gastrointestinal: no abdominal pain, change in bowel habits, or black or bloody stools Genito-Urinary: no dysuria, trouble voiding, or hematuria Hematological and Lymphatic: negative Breast: negative for breast lumps Musculoskeletal: negative Remaining ROS negative.  Physical Exam: Blood pressure 121/78, pulse 68, temperature 98.6 F (37 C), resp. rate 20, height 5\' 6"  (1.676 m), weight 193 lb 11.2 oz (87.862 kg). ECOG:  General appearance: alert, cooperative, appears stated age and combative Head: Normocephalic, without obvious abnormality, atraumatic Neck: no adenopathy, no  carotid bruit, no JVD, supple, symmetrical, trachea midline and thyroid not enlarged, symmetric, no tenderness/mass/nodules Lymph nodes: Cervical, supraclavicular, and axillary nodes normal. Cardiac : regular rate and rhythm, no murmurs or gallops Pulmonary:clear to auscultation bilaterally and normal percussion bilaterally Breasts: negative Abdomen:soft, non-tender; bowel sounds normal; no masses,  no organomegaly Extremities negative Neuro: alert, oriented, normal speech, no focal findings or movement disorder noted  Lab Results: Lab Results  Component Value Date   WBC 4.3 04/09/2012   HGB 12.6 04/09/2012   HCT 37.4 04/09/2012   MCV 100.5 04/09/2012   PLT 154 04/09/2012     Chemistry      Component Value Date/Time   NA 142 04/09/2012 1009   NA 140 10/13/2011 1505   K 3.9 04/09/2012 1009   K 3.8 10/13/2011 1505   CL 104 04/09/2012 1009   CL 104 10/13/2011 1505   CO2 29 04/09/2012 1009   CO2 28 10/13/2011 1505   BUN 26.0 04/09/2012 1009   BUN 27* 10/13/2011 1505   CREATININE 1.1 04/09/2012 1009   CREATININE 1.13* 10/13/2011 1505      Component Value Date/Time   CALCIUM 9.3 04/09/2012 1009   CALCIUM 9.2 10/13/2011 1505   ALKPHOS 55 04/09/2012 1009   ALKPHOS 51 10/13/2011 1505   AST 18 04/09/2012 1009   AST 21 10/13/2011 1505   ALT 21 04/09/2012 1009   ALT 22 10/13/2011 1505   BILITOT 0.30 04/09/2012 1009   BILITOT 0.2* 10/13/2011 1505      .pathology. Radiological Studies: chest X-ray n/a Mammogram 4/12- wnl Bone density 2011- wnl  Impression and Plan: 71 yo post menopausal woman  with hx of er+, breast cancer on tamoxifen. Pesary and estring are still in place, she is doing well without evidence  of disease, f/u in 6 months.with imaging studies..  More than 50% of the visit was spent in patient-related counselling   Pierce Crane, MD 12/6/20134:01 PM

## 2012-04-16 NOTE — Telephone Encounter (Signed)
gve the pt her June 2014 appt calendar 

## 2012-04-19 DIAGNOSIS — M5030 Other cervical disc degeneration, unspecified cervical region: Secondary | ICD-10-CM | POA: Diagnosis not present

## 2012-04-19 DIAGNOSIS — M999 Biomechanical lesion, unspecified: Secondary | ICD-10-CM | POA: Diagnosis not present

## 2012-04-19 DIAGNOSIS — M47814 Spondylosis without myelopathy or radiculopathy, thoracic region: Secondary | ICD-10-CM | POA: Diagnosis not present

## 2012-04-19 DIAGNOSIS — M9981 Other biomechanical lesions of cervical region: Secondary | ICD-10-CM | POA: Diagnosis not present

## 2012-05-13 ENCOUNTER — Other Ambulatory Visit (INDEPENDENT_AMBULATORY_CARE_PROVIDER_SITE_OTHER): Payer: Medicare Other

## 2012-05-13 DIAGNOSIS — R5383 Other fatigue: Secondary | ICD-10-CM

## 2012-05-13 DIAGNOSIS — R5381 Other malaise: Secondary | ICD-10-CM | POA: Diagnosis not present

## 2012-05-13 DIAGNOSIS — E785 Hyperlipidemia, unspecified: Secondary | ICD-10-CM

## 2012-05-13 LAB — LIPID PANEL
HDL: 46.2 mg/dL (ref >39.00)
LDL Cholesterol: 104 mg/dL — ABNORMAL HIGH (ref 0–99)
Total CHOL/HDL Ratio: 4
VLDL: 25.8 mg/dL (ref 0.0–40.0)

## 2012-05-13 LAB — CBC WITH DIFFERENTIAL/PLATELET
Eosinophils Relative: 4.5 % (ref 0.0–5.0)
Monocytes Relative: 10.3 % (ref 3.0–12.0)
Neutrophils Relative %: 38.6 % — ABNORMAL LOW (ref 43.0–77.0)
Platelets: 147 10*3/uL — ABNORMAL LOW (ref 150.0–400.0)
WBC: 4.5 10*3/uL (ref 4.5–10.5)

## 2012-05-18 DIAGNOSIS — M47814 Spondylosis without myelopathy or radiculopathy, thoracic region: Secondary | ICD-10-CM | POA: Diagnosis not present

## 2012-05-18 DIAGNOSIS — M999 Biomechanical lesion, unspecified: Secondary | ICD-10-CM | POA: Diagnosis not present

## 2012-05-18 DIAGNOSIS — M5030 Other cervical disc degeneration, unspecified cervical region: Secondary | ICD-10-CM | POA: Diagnosis not present

## 2012-05-18 DIAGNOSIS — M9981 Other biomechanical lesions of cervical region: Secondary | ICD-10-CM | POA: Diagnosis not present

## 2012-06-09 ENCOUNTER — Encounter: Payer: Self-pay | Admitting: Obstetrics & Gynecology

## 2012-06-09 ENCOUNTER — Ambulatory Visit (INDEPENDENT_AMBULATORY_CARE_PROVIDER_SITE_OTHER): Payer: Medicare Other | Admitting: Obstetrics & Gynecology

## 2012-06-09 VITALS — BP 130/81 | HR 67 | Ht 66.0 in | Wt 195.0 lb

## 2012-06-09 DIAGNOSIS — N814 Uterovaginal prolapse, unspecified: Secondary | ICD-10-CM | POA: Diagnosis not present

## 2012-06-09 DIAGNOSIS — N8110 Cystocele, unspecified: Secondary | ICD-10-CM | POA: Insufficient documentation

## 2012-06-09 NOTE — Progress Notes (Signed)
  Subjective:    Patient ID: Darlene Lawrence, female    DOB: 1941-03-21, 72 y.o.   MRN: [id number]  HPI  Mrs. Castleman is here because she would like to retry the Gelhorn (2 1/4 inch) now that she has figured out about tying a string to the pessary. She has no other complaints.  Review of Systems Her oncologist has ordered her mammogram which is due next month. Her annual exam is due in July 2014.    Objective:   Physical Exam  Healthy vaginal mucosa I replaced the 70 mm ring with a 2 1/4 inch Gelhorn with a string through the estring and the pessary, for easier removal.      Assessment & Plan:  As above. If this pessary is too small, I can order the next size up.

## 2012-06-09 NOTE — Progress Notes (Signed)
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  She completed radiation therapy in September 2009, previously on Femara for about 3 months now on tamoxifen 20 mg p.o. Daily.   Interval History: she is on tamoxifen and is doing well . she has no complaints     ROS:  14 point review of systems was negative  Allergies  Allergen Reactions  . Cefixime     REACTION: Rash  . Penicillins     REACTION: Rash    Current Outpatient Prescriptions  Medication Sig Dispense Refill  . aspirin 81 MG tablet Take 81 mg by mouth daily.        . Calcium Citrate 200 MG TABS Take 1 tablet by mouth daily.        . Cholecalciferol (VITAMIN D) 2000 UNITS tablet Take 4,000 Units by mouth daily.        Stephanie Acre (GLUCOSAMINE MSM COMPLEX) TABS Take 1 tablet by mouth daily.        Marland Kitchen b complex vitamins tablet Take 1 tablet by mouth daily.      . Coenzyme Q10 (CO Q-10) 100 MG CAPS Take by mouth.      . ESTRING 2 MG vaginal ring       . magnesium oxide (MAG-OX) 400 MG tablet Take 400 mg by mouth daily.      . rosuvastatin (CRESTOR) 10 MG tablet Take 10 mg by mouth. Take two times a week.      . tamoxifen (NOLVADEX) 20 MG tablet TAKE 1 TABLET BY MOUTH EVERY DAY  90 tablet  1  . triamterene-hydrochlorothiazide (MAXZIDE-25) 37.5-25 MG per tablet Take 0.5 each (0.5 tablets total) by mouth daily.  45 tablet  11     Objective:  Filed Vitals:   10/15/11 1619  BP: 138/85  Pulse: 71  Temp: 98.4 F (36.9 C)    BMI: Body mass index is 32.73 kg/(m^2).   ECOG FS:  Physical Exam:   Sclerae unicteric  Oropharynx clear  No peripheral adenopathy  Lungs clear -- no rales or rhonchi  Heart regular rate and rhythm  Abdomen benign  MSK  no focal spinal tenderness, no peripheral edema  Neuro nonfocal  Breast exam: both breast are free of masses as are the axillae.  Lab Results:      Chemistry      Component Value Date/Time   NA 142 04/09/2012 1009   NA 140 10/13/2011 1505   K 3.9 04/09/2012 1009   K 3.8 10/13/2011 1505   CL 104 04/09/2012 1009   CL 104 10/13/2011 1505   CO2 29 04/09/2012 1009   CO2 28 10/13/2011 1505   BUN 26.0 04/09/2012 1009   BUN 27* 10/13/2011 1505   CREATININE 1.1 04/09/2012 1009   CREATININE 1.13* 10/13/2011 1505      Component Value Date/Time   CALCIUM 9.3 04/09/2012 1009   CALCIUM 9.2 10/13/2011 1505   ALKPHOS 55 04/09/2012 1009   ALKPHOS 51 10/13/2011 1505   AST 18 04/09/2012 1009   AST 21 10/13/2011 1505   ALT 21 04/09/2012 1009   ALT 22 10/13/2011 1505   BILITOT 0.30 04/09/2012 1009   BILITOT 0.2* 10/13/2011 1505       Lab Results  Component Value Date   WBC 4.5 05/13/2012   HGB 12.5 05/13/2012   HCT 36.4 05/13/2012   MCV 99.1 05/13/2012   PLT 147.0* 05/13/2012   NEUTROABS 1.7 05/13/2012    Studies/Results:  No results found.  Assessment: node negative er/pr+ breast cancer in 4th yr of f/u, on tamoxifen. Most recent   Mammogram 4/13 wnl.  Plan: plan to f/u in 6 months, due for dexa in 7/13.      Babetta Paterson 06/09/2012

## 2012-06-14 DIAGNOSIS — Z96649 Presence of unspecified artificial hip joint: Secondary | ICD-10-CM | POA: Diagnosis not present

## 2012-06-14 DIAGNOSIS — M25569 Pain in unspecified knee: Secondary | ICD-10-CM | POA: Diagnosis not present

## 2012-06-14 DIAGNOSIS — M169 Osteoarthritis of hip, unspecified: Secondary | ICD-10-CM | POA: Diagnosis not present

## 2012-06-15 DIAGNOSIS — M47814 Spondylosis without myelopathy or radiculopathy, thoracic region: Secondary | ICD-10-CM | POA: Diagnosis not present

## 2012-06-15 DIAGNOSIS — M9981 Other biomechanical lesions of cervical region: Secondary | ICD-10-CM | POA: Diagnosis not present

## 2012-06-15 DIAGNOSIS — M999 Biomechanical lesion, unspecified: Secondary | ICD-10-CM | POA: Diagnosis not present

## 2012-06-15 DIAGNOSIS — M5030 Other cervical disc degeneration, unspecified cervical region: Secondary | ICD-10-CM | POA: Diagnosis not present

## 2012-07-03 ENCOUNTER — Other Ambulatory Visit: Payer: Self-pay | Admitting: Oncology

## 2012-07-06 ENCOUNTER — Other Ambulatory Visit: Payer: Self-pay | Admitting: Oncology

## 2012-07-06 DIAGNOSIS — Z853 Personal history of malignant neoplasm of breast: Secondary | ICD-10-CM

## 2012-07-06 DIAGNOSIS — Z9889 Other specified postprocedural states: Secondary | ICD-10-CM

## 2012-07-07 ENCOUNTER — Other Ambulatory Visit: Payer: Self-pay | Admitting: *Deleted

## 2012-07-07 DIAGNOSIS — C50911 Malignant neoplasm of unspecified site of right female breast: Secondary | ICD-10-CM

## 2012-07-07 MED ORDER — TAMOXIFEN CITRATE 20 MG PO TABS: 20.0000 mg | ORAL_TABLET | Freq: Every day | ORAL | Status: DC

## 2012-07-12 DIAGNOSIS — M999 Biomechanical lesion, unspecified: Secondary | ICD-10-CM | POA: Diagnosis not present

## 2012-07-12 DIAGNOSIS — M5030 Other cervical disc degeneration, unspecified cervical region: Secondary | ICD-10-CM | POA: Diagnosis not present

## 2012-07-26 DIAGNOSIS — M1710 Unilateral primary osteoarthritis, unspecified knee: Secondary | ICD-10-CM | POA: Diagnosis not present

## 2012-07-31 ENCOUNTER — Encounter: Payer: Self-pay | Admitting: Oncology

## 2012-07-31 ENCOUNTER — Telehealth: Payer: Self-pay | Admitting: Oncology

## 2012-08-06 DIAGNOSIS — M1710 Unilateral primary osteoarthritis, unspecified knee: Secondary | ICD-10-CM | POA: Diagnosis not present

## 2012-08-09 DIAGNOSIS — M5030 Other cervical disc degeneration, unspecified cervical region: Secondary | ICD-10-CM | POA: Diagnosis not present

## 2012-08-09 DIAGNOSIS — M47814 Spondylosis without myelopathy or radiculopathy, thoracic region: Secondary | ICD-10-CM | POA: Diagnosis not present

## 2012-08-09 DIAGNOSIS — M9981 Other biomechanical lesions of cervical region: Secondary | ICD-10-CM | POA: Diagnosis not present

## 2012-08-09 DIAGNOSIS — M999 Biomechanical lesion, unspecified: Secondary | ICD-10-CM | POA: Diagnosis not present

## 2012-08-10 ENCOUNTER — Other Ambulatory Visit: Payer: Self-pay | Admitting: Oncology

## 2012-08-13 DIAGNOSIS — M1710 Unilateral primary osteoarthritis, unspecified knee: Secondary | ICD-10-CM | POA: Diagnosis not present

## 2012-08-16 ENCOUNTER — Ambulatory Visit
Admission: RE | Admit: 2012-08-16 | Discharge: 2012-08-16 | Disposition: A | Discharge status: Home / Self Care | Payer: Medicare Other | Source: Ambulatory Visit | Attending: Oncology | Admitting: Oncology

## 2012-08-16 DIAGNOSIS — Z853 Personal history of malignant neoplasm of breast: Secondary | ICD-10-CM

## 2012-08-16 DIAGNOSIS — Z9889 Other specified postprocedural states: Secondary | ICD-10-CM

## 2012-09-06 DIAGNOSIS — M999 Biomechanical lesion, unspecified: Secondary | ICD-10-CM | POA: Diagnosis not present

## 2012-09-06 DIAGNOSIS — M5030 Other cervical disc degeneration, unspecified cervical region: Secondary | ICD-10-CM | POA: Diagnosis not present

## 2012-09-06 DIAGNOSIS — M47814 Spondylosis without myelopathy or radiculopathy, thoracic region: Secondary | ICD-10-CM | POA: Diagnosis not present

## 2012-09-06 DIAGNOSIS — M9981 Other biomechanical lesions of cervical region: Secondary | ICD-10-CM | POA: Diagnosis not present

## 2012-10-05 DIAGNOSIS — M9981 Other biomechanical lesions of cervical region: Secondary | ICD-10-CM | POA: Diagnosis not present

## 2012-10-05 DIAGNOSIS — M5030 Other cervical disc degeneration, unspecified cervical region: Secondary | ICD-10-CM | POA: Diagnosis not present

## 2012-10-05 DIAGNOSIS — M47814 Spondylosis without myelopathy or radiculopathy, thoracic region: Secondary | ICD-10-CM | POA: Diagnosis not present

## 2012-10-05 DIAGNOSIS — M999 Biomechanical lesion, unspecified: Secondary | ICD-10-CM | POA: Diagnosis not present

## 2012-10-13 ENCOUNTER — Other Ambulatory Visit: Payer: Self-pay | Admitting: Medical Oncology

## 2012-10-13 DIAGNOSIS — C50919 Malignant neoplasm of unspecified site of unspecified female breast: Secondary | ICD-10-CM

## 2012-10-14 ENCOUNTER — Other Ambulatory Visit (HOSPITAL_BASED_OUTPATIENT_CLINIC_OR_DEPARTMENT_OTHER): Payer: Medicare Other | Admitting: Lab

## 2012-10-14 DIAGNOSIS — C50419 Malignant neoplasm of upper-outer quadrant of unspecified female breast: Secondary | ICD-10-CM | POA: Diagnosis not present

## 2012-10-14 DIAGNOSIS — M25569 Pain in unspecified knee: Secondary | ICD-10-CM | POA: Diagnosis not present

## 2012-10-14 DIAGNOSIS — C50919 Malignant neoplasm of unspecified site of unspecified female breast: Secondary | ICD-10-CM

## 2012-10-14 LAB — CBC WITH DIFFERENTIAL/PLATELET
BASO%: 0.6 % (ref 0.0–2.0)
Eosinophils Absolute: 0.2 10*3/uL (ref 0.0–0.5)
MCHC: 33.1 g/dL (ref 31.5–36.0)
MCV: 99.7 fL (ref 79.5–101.0)
MONO%: 8.2 % (ref 0.0–14.0)
NEUT#: 2 10*3/uL (ref 1.5–6.5)
RBC: 3.82 10*6/uL (ref 3.70–5.45)
RDW: 13.8 % (ref 11.2–14.5)
WBC: 4.8 10*3/uL (ref 3.9–10.3)
nRBC: 0 % (ref 0–0)

## 2012-10-14 LAB — COMPREHENSIVE METABOLIC PANEL (CC13)
AST: 18 U/L (ref 5–34)
Albumin: 3.3 g/dL — ABNORMAL LOW (ref 3.5–5.0)
BUN: 24.5 mg/dL (ref 7.0–26.0)
CO2: 27 mEq/L (ref 22–29)
Calcium: 9.1 mg/dL (ref 8.4–10.4)
Chloride: 107 mEq/L (ref 98–107)
Glucose: 92 mg/dl (ref 70–99)
Potassium: 4.2 mEq/L (ref 3.5–5.1)

## 2012-10-21 ENCOUNTER — Ambulatory Visit: Payer: Medicare Other | Admitting: Oncology

## 2012-10-21 DIAGNOSIS — M25569 Pain in unspecified knee: Secondary | ICD-10-CM | POA: Diagnosis not present

## 2012-10-22 ENCOUNTER — Encounter: Payer: Self-pay | Admitting: Oncology

## 2012-10-22 ENCOUNTER — Ambulatory Visit (HOSPITAL_BASED_OUTPATIENT_CLINIC_OR_DEPARTMENT_OTHER): Payer: Medicare Other | Admitting: Oncology

## 2012-10-22 ENCOUNTER — Telehealth: Payer: Self-pay | Admitting: Oncology

## 2012-10-22 VITALS — BP 129/75 | HR 66 | Temp 98.5°F | Resp 18 | Ht 66.0 in | Wt 201.9 lb

## 2012-10-22 DIAGNOSIS — D696 Thrombocytopenia, unspecified: Secondary | ICD-10-CM

## 2012-10-22 DIAGNOSIS — C50919 Malignant neoplasm of unspecified site of unspecified female breast: Secondary | ICD-10-CM

## 2012-10-22 NOTE — Progress Notes (Signed)
OFFICE PROGRESS NOTE  CC  Kerby Nora, MD [address] 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  PRIOR THERAPY:  #1 patient had a screening mammogram at the age of 35 in March 2009 which was noted to have a possible mass in the right breast. Spot views and ultrasound performed on April 8 showed presence of small spiculated mass in the right upper outer quadrant posteriorly with some adjacent microcalcifications. Ultrasound showed a spiculated solid mass at 10:00 7 cm from the right nipple measuring 7 x 7 x 8 mm. Ultrasound of the right axilla demonstrated normal right axillary lymph nodes. She had a core biopsy performed on April 8 that showed invasive mammary carcinoma. Patient had MRI of the breasts bilaterally on April 16 that showed a solitary abnormality enhancing mass on the right breast upper outer quadrant corresponding to the proven breast cancer. She had no evidence of multicentric multifocal or contralateral disease.  #2 patient was seen by Dr. Donnalee Curry and she had a lumpectomy with sentinel lymph node biopsy on 10/06/2007. Her final pathology revealed a grade 1 invasive ductal carcinoma measuring 1.0 cm. There was associated DCIS. No lymphovascular invasion. Tumor was ER +90% PR +58% proliferation marker Ki-67 9% and HER-2/neu negative.  #3 patient then went on to receive adjuvant radiation therapy to the right breast from July 15 through 01/11/2008. She tolerated it well.  #4 patient was seen by Dr. Pierce Crane on 01/13/2008 for discussion of adjuvant systemic therapy. He recommended adjuvant hormonal therapy consisting of Femara 2.5 mg daily for about 3 months. Thereafter he switched her to tamoxifen 20 mg daily in January 2010. Overall she's been tolerating this well.   CURRENT THERAPY: tamoxifen 20 mg daily she  will complete 5 years of tamoxifen in September 2014.   INTERVAL HISTORY: Darlene Lawrence 72 y.o. female returns for followup visit overall she's doing well she's been tolerating the tamoxifen without any significant problems. She denies any fevers chills night sweats headaches shortness of breath chest pains palpitations no myalgias and arthralgias no vaginal bleeding or discharge no swelling in her lower extremities. Remainder of the 10 point review of systems is negative.   MEDICAL HISTORY: Past Medical History  Diagnosis Date  . Hyperlipidemia   . Hypertension   . breast cancer   . Abnormal Pap smear   . Arthritis   . High cholesterol     ALLERGIES:  is allergic to cefixime and penicillins.  MEDICATIONS:  Current Outpatient Prescriptions  Medication Sig Dispense Refill  . aspirin 81 MG tablet Take 81 mg by mouth daily.        Marland Kitchen b complex vitamins tablet Take 1 tablet by mouth daily.      . Calcium Citrate 200 MG TABS Take 1 tablet by mouth daily.        . Cholecalciferol (VITAMIN D) 2000 UNITS tablet Take 4,000 Units by mouth daily.        . Coenzyme Q10 (CO Q-10) 100 MG CAPS Take by mouth.      . ESTRING 2 MG vaginal ring       . Glucos-MSM-C-Mn-Ginger-Willow (GLUCOSAMINE MSM COMPLEX) TABS Take 1 tablet by mouth daily.        . magnesium oxide (MAG-OX) 400 MG tablet Take 400 mg by mouth daily.      Marland Kitchen  rosuvastatin (CRESTOR) 10 MG tablet Take 10 mg by mouth. Take two times a week.      . tamoxifen (NOLVADEX) 20 MG tablet Take 1 tablet (20 mg total) by mouth daily.  90 tablet  1  . triamterene-hydrochlorothiazide (MAXZIDE-25) 37.5-25 MG per tablet Take 0.5 each (0.5 tablets total) by mouth daily.  45 tablet  11   No current facility-administered medications for this visit.    SURGICAL HISTORY:  Past Surgical History  Procedure Laterality Date  . Hernia repair      umbilical  . Tubal ligation    . Breast surgery      lumectomy, no radiation, no chemo  . Dilation and  curettage of uterus  2012    Dr. Stanford Breed    REVIEW OF SYSTEMS:  Pertinent items are noted in HPI.   HEALTH MAINTENANCE:  Mammogram 08/16/2012  Bone  Scan 11/12/2011    PHYSICAL EXAMINATION: Blood pressure 129/75, pulse 66, temperature 98.5 F (36.9 C), temperature source Oral, resp. rate 18, height 5\' 6"  (1.676 m), weight 201 lb 14.4 oz (91.581 kg). Body mass index is 32.6 kg/(m^2). ECOG PERFORMANCE STATUS: 0 - Asymptomatic   General appearance: alert, cooperative and appears stated age Resp: clear to auscultation bilaterally Cardio: regular rate and rhythm GI: soft, non-tender; bowel sounds normal; no masses,  no organomegaly Extremities: extremities normal, atraumatic, no cyanosis or edema Neurologic: Grossly normal  breast examination right breast no masses nipple discharge no tenderness or other skin changes.   left breast no masses or nipple discharge or skin changes  LABORATORY DATA: Lab Results  Component Value Date   WBC 4.8 10/14/2012   HGB 12.6 10/14/2012   HCT 38.1 10/14/2012   MCV 99.7 10/14/2012   PLT 138* 10/14/2012      Chemistry      Component Value Date/Time   NA 142 10/14/2012 0951   NA 140 10/13/2011 1505   K 4.2 10/14/2012 0951   K 3.8 10/13/2011 1505   CL 107 10/14/2012 0951   CL 104 10/13/2011 1505   CO2 27 10/14/2012 0951   CO2 28 10/13/2011 1505   BUN 24.5 10/14/2012 0951   BUN 27* 10/13/2011 1505   CREATININE 0.9 10/14/2012 0951   CREATININE 1.13* 10/13/2011 1505      Component Value Date/Time   CALCIUM 9.1 10/14/2012 0951   CALCIUM 9.2 10/13/2011 1505   ALKPHOS 53 10/14/2012 0951   ALKPHOS 51 10/13/2011 1505   AST 18 10/14/2012 0951   AST 21 10/13/2011 1505   ALT 20 10/14/2012 0951   ALT 22 10/13/2011 1505   BILITOT 0.35 10/14/2012 0951   BILITOT 0.2* 10/13/2011 1505       RADIOGRAPHIC STUDIES:  No results found.  ASSESSMENT: 72 year old female with  #1 history of stage I (T1 B. N0) invasive ductal carcinoma that was grade 1 ER positive PR positive HER-2/neu  negative with a low Ki-67. Patient underwent a lumpectomy with sentinel lymph node biopsy in Oct 06 2007. Her nodes were negative. She was then given adjuvant radiation therapy completing in September 2009. Followed by adjuvant antiestrogen therapy initially 3 months or Femara followed by tamoxifen in January 2010. She will finish a total of 5 years of adjuvant therapy in September 2014. She is without any evidence of recurrent disease.  #2 thrombocytopenia continue to monitor her  #3 other medical problems her primary care physician.    PLAN:   #1 continue tamoxifen 20 mg daily until end of September 2014  when she will have finished total of 5 years of antiestrogen therapy.  #2 I will see the patient back in one years time for followup.    All questions were answered. The patient knows to call the clinic with any problems, questions or concerns. We can certainly see the patient much sooner if necessary.  I spent 25 minutes counseling the patient face to face. The total time spent in the appointment was 30 minutes.    Drue Second, MD Medical/Oncology [facility] [phone] (beeper) [phone] (Office)  10/22/2012, 3:23 PM

## 2012-10-22 NOTE — Patient Instructions (Addendum)
Doing well You will complete 5 years of tamoxifen in Spetember 2014. Then you can discontinue it  We will continue to see you once a year. Your next visit will be in May 2015

## 2012-11-01 DIAGNOSIS — M47814 Spondylosis without myelopathy or radiculopathy, thoracic region: Secondary | ICD-10-CM | POA: Diagnosis not present

## 2012-11-01 DIAGNOSIS — M5030 Other cervical disc degeneration, unspecified cervical region: Secondary | ICD-10-CM | POA: Diagnosis not present

## 2012-11-01 DIAGNOSIS — M999 Biomechanical lesion, unspecified: Secondary | ICD-10-CM | POA: Diagnosis not present

## 2012-11-01 DIAGNOSIS — M9981 Other biomechanical lesions of cervical region: Secondary | ICD-10-CM | POA: Diagnosis not present

## 2012-11-02 DIAGNOSIS — M23302 Other meniscus derangements, unspecified lateral meniscus, unspecified knee: Secondary | ICD-10-CM | POA: Diagnosis not present

## 2012-11-02 DIAGNOSIS — M2240 Chondromalacia patellae, unspecified knee: Secondary | ICD-10-CM | POA: Diagnosis not present

## 2012-11-04 ENCOUNTER — Ambulatory Visit (INDEPENDENT_AMBULATORY_CARE_PROVIDER_SITE_OTHER): Payer: Medicare Other | Admitting: Obstetrics & Gynecology

## 2012-11-04 ENCOUNTER — Encounter: Payer: Self-pay | Admitting: Obstetrics & Gynecology

## 2012-11-04 VITALS — BP 151/83 | HR 58 | Resp 16 | Ht 65.0 in | Wt 202.0 lb

## 2012-11-04 DIAGNOSIS — N852 Hypertrophy of uterus: Secondary | ICD-10-CM | POA: Diagnosis not present

## 2012-11-04 DIAGNOSIS — N814 Uterovaginal prolapse, unspecified: Secondary | ICD-10-CM | POA: Diagnosis not present

## 2012-11-04 NOTE — Progress Notes (Signed)
  Subjective:    Patient ID: Darlene Lawrence, female    DOB: [birth year], 72 y.o.   MRN: [id number]  HPI  [name] is a 72 yo lady lady with prolapse that is here today for an exam. She is removing her pessary and Estring nightly.  Review of Systems     Objective:   Physical Exam  Normal vaginal mucosa with no erosions Her uterus is about 16 week size. This is larger than in the past. I did an pelvic u/s in 1/12 and the uterus measured 11 cm. Her adnexa were not palpable. I cleaned and replaced her pessary. The cervix appeared normal.      Assessment & Plan:  Prolapse- continue pessary Uterine appears larger on exam than in the past- I have ordered a repeat pelvic u/s.

## 2012-11-08 ENCOUNTER — Telehealth: Payer: Self-pay | Admitting: Family Medicine

## 2012-11-08 NOTE — Telephone Encounter (Signed)
HAs had labs elesewhere in 10/2012. Lipids well controlled in 1.2014.Marland Kitchen No need for lab visit prior to appt.

## 2012-11-08 NOTE — Telephone Encounter (Signed)
Message copied by Excell Seltzer on Mon Nov 08, 2012 10:54 AM ------      Message from: Alvina Chou      Created: Tue Oct 26, 2012  2:54 PM      Regarding: Lab orders for Tuesday, July 1.14       Patient is scheduled for CPX labs, please order future labs, Thanks , Terri       ------

## 2012-11-09 ENCOUNTER — Other Ambulatory Visit: Payer: Medicare Other

## 2012-11-11 ENCOUNTER — Encounter: Payer: Self-pay | Admitting: Family Medicine

## 2012-11-11 ENCOUNTER — Ambulatory Visit (INDEPENDENT_AMBULATORY_CARE_PROVIDER_SITE_OTHER): Payer: Medicare Other | Admitting: Family Medicine

## 2012-11-11 VITALS — BP 128/80 | HR 60 | Temp 98.5°F | Ht 65.0 in | Wt 201.0 lb

## 2012-11-11 DIAGNOSIS — E785 Hyperlipidemia, unspecified: Secondary | ICD-10-CM

## 2012-11-11 DIAGNOSIS — Z0000 Encounter for general adult medical examination without abnormal findings: Secondary | ICD-10-CM | POA: Diagnosis not present

## 2012-11-11 DIAGNOSIS — R0789 Other chest pain: Secondary | ICD-10-CM

## 2012-11-11 DIAGNOSIS — D696 Thrombocytopenia, unspecified: Secondary | ICD-10-CM

## 2012-11-11 DIAGNOSIS — I10 Essential (primary) hypertension: Secondary | ICD-10-CM

## 2012-11-11 DIAGNOSIS — R071 Chest pain on breathing: Secondary | ICD-10-CM

## 2012-11-11 NOTE — Progress Notes (Signed)
I have personally reviewed the Medicare Annual Wellness questionnaire and have noted  1. The patient's medical and social history  2. Their use of alcohol, tobacco or illicit drugs  3. Their current medications and supplements  4. The patient's functional ability including ADL's, fall risks, home safety risks and hearing or visual  impairment.  5. Diet and physical activities  6. Evidence for depression or mood disorders  The patients weight, height, BMI and visual acuity have been recorded in the chart  I have made referrals, counseling and provided education to the patient based review of the above and I have provided the pt with a written personalized care plan for preventive services.   Hypertension: Well controlled on current meds.  Using medication without problems or lightheadedness: None Chest pain with exertion:None  Edema:None  Short of breath:None  Average home BPs:120/70   Elevated Cholesterol: LDL at goal <130 on crestor 10 mg twice a week..  Lab Results  Component Value Date   CHOL 176 05/13/2012   HDL 46.20 05/13/2012   LDLCALC 104* 05/13/2012   TRIG 129.0 05/13/2012   CHOLHDL 4 05/13/2012   Using medications without problems: None  Muscle aches: None  Other complaints:   Breast Cancer: Sees Dr. Welton Flakes now.  Plan to take her off tamoxifen 01/2013... Has been on 5 years. Breast exam was normal.  No further testing.   Left hip replacement in 2012. Dr. Despina Hick.  Recovered well.  Moving better, able to sleep beter.  Plans on right knee replacement in 05/2013  8  months of mild pain in right upper chest wall, increased with moving right  Arm. Not associated with other symptoms or exertion.  Review of Systems  Constitutional: Negative for fever and some Fatigue ongoing for a while.  HENT: Negative for ear pain.  Eyes: Negative for pain.  Respiratory: Negative for chest tightness and shortness of breath.  Cardiovascular: Negative for chest pain, palpitations and leg  swelling.  Gastrointestinal: Negative for abdominal pain. No blood in stool.  Genitourinary: Negative for dysuria.  Objective:   Physical Exam  Constitutional: Vital signs are normal. She appears well-developed and well-nourished. She is cooperative. Non-toxic appearance. She does not appear ill. No distress.  HENT:  Head: Normocephalic.  Right Ear: Hearing, tympanic membrane, external ear and ear canal normal.  Left Ear: Hearing, tympanic membrane, external ear and ear canal normal.  Nose: Nose normal.  Eyes: Conjunctivae, EOM and lids are normal. Pupils are equal, round, and reactive to light. No foreign bodies found.  Neck: Trachea normal and normal range of motion. Neck supple. Carotid bruit is not present. No mass and no thyromegaly present.  Cardiovascular: Normal rate, regular rhythm, S1 normal, S2 normal, normal heart sounds and intact distal pulses. Exam reveals no gallop.  No murmur heard.  Pulmonary/Chest: Effort normal and breath sounds normal. No respiratory distress. She has no wheezes. She has no rhonchi. She has no rales.  Abdominal: Soft. Normal appearance and bowel sounds are normal. She exhibits no distension, no fluid wave, no abdominal bruit and no mass. There is no hepatosplenomegaly. There is no tenderness. There is no rebound, no guarding and no CVA tenderness. No hernia.  Genitourinary: .  Per Dr. Marice Potter   MSK: ttp in right chest wall, prominent right sternoclavicular joint, neg crossover test B. Lymphadenopathy:  She has no cervical adenopathy.  She has no axillary adenopathy.  Neurological: She is alert. She has normal strength. No cranial nerve deficit or sensory deficit.  Skin: Skin is warm, dry and intact. No rash noted.  Psychiatric: Her speech is normal and behavior is normal. Judgment normal. Her mood appears not anxious. Cognition and memory are normal. She does not exhibit a depressed mood.  Assessment & Plan:   The patient's preventative maintenance and  recommended screening tests for an annual wellness exam were reviewed in full today.  Brought up to date unless services declined.  Counselled on the importance of diet, exercise, and its role in overall health and mortality.  The patient's FH and SH was reviewed, including their home life, tobacco status, and drug and alcohol status.   Vaccines: Uptodate with shingles, Td and PNA.  PAP/DVE: Per Dr. Marice Potter. Dr Marice Potter also as her on pessary for uterine prolapse.  Has upcoming US uterus for possible enlargement Breast exam per Dr. Welton Flakes and Marice Potter Colonoscopy: Dr. Matthias Hughs  Date: 11/03/2007  Next Due: 01/2013  Results: normal  Nonsmoker  DEXA: last normal in 2011, repeat in 2016

## 2012-11-11 NOTE — Patient Instructions (Addendum)
Colonoscopy due with Dr. Matthias Hughs after 01/2013. Gentle chest wall stretching. Anti-inflammatories as needed. Follow up if increasing.

## 2012-11-11 NOTE — Assessment & Plan Note (Signed)
No clear cardiac or pulmonary qualities. 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  Treat with gentle chest wall stretching.

## 2012-11-11 NOTE — Assessment & Plan Note (Signed)
Well controlled. Continue current medication.  

## 2012-11-11 NOTE — Assessment & Plan Note (Signed)
Will have her discuss this with Dr. Welton Flakes if further work up needed. Re-eval in 6 months.

## 2012-11-11 NOTE — Assessment & Plan Note (Signed)
Well controlled. Continue current medication. Encouraged exercise, weight loss, healthy eating habits.  

## 2012-11-15 ENCOUNTER — Ambulatory Visit (HOSPITAL_COMMUNITY)
Admission: RE | Admit: 2012-11-15 | Discharge: 2012-11-15 | Disposition: A | Discharge status: Home / Self Care | Payer: Medicare Other | Source: Ambulatory Visit | Attending: Obstetrics & Gynecology | Admitting: Obstetrics & Gynecology

## 2012-11-15 DIAGNOSIS — D259 Leiomyoma of uterus, unspecified: Secondary | ICD-10-CM | POA: Diagnosis not present

## 2012-11-15 DIAGNOSIS — Z853 Personal history of malignant neoplasm of breast: Secondary | ICD-10-CM | POA: Insufficient documentation

## 2012-11-15 DIAGNOSIS — N852 Hypertrophy of uterus: Secondary | ICD-10-CM | POA: Diagnosis not present

## 2012-11-16 ENCOUNTER — Encounter: Payer: Medicare Other | Admitting: Family Medicine

## 2012-11-18 ENCOUNTER — Telehealth: Payer: Self-pay | Admitting: *Deleted

## 2012-11-18 DIAGNOSIS — R9389 Abnormal findings on diagnostic imaging of other specified body structures: Secondary | ICD-10-CM

## 2012-11-18 MED ORDER — MISOPROSTOL 200 MCG PO TABS: ORAL_TABLET | ORAL | Status: DC

## 2012-11-18 NOTE — Telephone Encounter (Signed)
Message copied by Tandy Gaw C on Thu Nov 18, 2012 10:33 AM ------      Message from: Nicholaus Bloom C      Created: Thu Nov 18, 2012  8:51 AM       She has a thickened endometrium. She will need a endometrial biopsy scheduled. She should take cytotec 600 mcg by mouth the night before the procedure. Can you please prescribe this?       Thanks ------

## 2012-11-18 NOTE — Telephone Encounter (Signed)
Pt aware.  Appt scheduled.  Cytotec has been sent to her pharmacy.

## 2012-11-29 DIAGNOSIS — M999 Biomechanical lesion, unspecified: Secondary | ICD-10-CM | POA: Diagnosis not present

## 2012-11-29 DIAGNOSIS — M5030 Other cervical disc degeneration, unspecified cervical region: Secondary | ICD-10-CM | POA: Diagnosis not present

## 2012-11-29 DIAGNOSIS — M9981 Other biomechanical lesions of cervical region: Secondary | ICD-10-CM | POA: Diagnosis not present

## 2012-11-29 DIAGNOSIS — M62838 Other muscle spasm: Secondary | ICD-10-CM | POA: Diagnosis not present

## 2012-12-02 ENCOUNTER — Encounter: Payer: Self-pay | Admitting: *Deleted

## 2012-12-06 ENCOUNTER — Telehealth: Payer: Self-pay | Admitting: *Deleted

## 2012-12-06 ENCOUNTER — Ambulatory Visit (INDEPENDENT_AMBULATORY_CARE_PROVIDER_SITE_OTHER): Payer: Medicare Other | Admitting: Obstetrics & Gynecology

## 2012-12-06 ENCOUNTER — Encounter: Payer: Self-pay | Admitting: Obstetrics & Gynecology

## 2012-12-06 VITALS — BP 136/77 | HR 61 | Ht 66.0 in | Wt 202.0 lb

## 2012-12-06 DIAGNOSIS — E785 Hyperlipidemia, unspecified: Secondary | ICD-10-CM

## 2012-12-06 DIAGNOSIS — D696 Thrombocytopenia, unspecified: Secondary | ICD-10-CM

## 2012-12-06 DIAGNOSIS — R5383 Other fatigue: Secondary | ICD-10-CM

## 2012-12-06 DIAGNOSIS — R9389 Abnormal findings on diagnostic imaging of other specified body structures: Secondary | ICD-10-CM | POA: Diagnosis not present

## 2012-12-06 DIAGNOSIS — D261 Other benign neoplasm of corpus uteri: Secondary | ICD-10-CM | POA: Diagnosis not present

## 2012-12-06 NOTE — Telephone Encounter (Signed)
Can you sign off on lab orders b/c it wont let me sign because b 12

## 2012-12-17 ENCOUNTER — Encounter: Payer: Self-pay | Admitting: Obstetrics & Gynecology

## 2012-12-21 ENCOUNTER — Telehealth: Payer: Self-pay | Admitting: *Deleted

## 2012-12-21 MED ORDER — MEDROXYPROGESTERONE ACETATE 10 MG PO TABS: 10.0000 mg | ORAL_TABLET | Freq: Every day | ORAL | Status: DC

## 2012-12-21 NOTE — Telephone Encounter (Signed)
Called in Provera 10 mg for patient per Dr. Marice Potter.

## 2013-01-03 DIAGNOSIS — M62838 Other muscle spasm: Secondary | ICD-10-CM | POA: Diagnosis not present

## 2013-01-03 DIAGNOSIS — M999 Biomechanical lesion, unspecified: Secondary | ICD-10-CM | POA: Diagnosis not present

## 2013-01-03 DIAGNOSIS — M9981 Other biomechanical lesions of cervical region: Secondary | ICD-10-CM | POA: Diagnosis not present

## 2013-01-03 DIAGNOSIS — M5030 Other cervical disc degeneration, unspecified cervical region: Secondary | ICD-10-CM | POA: Diagnosis not present

## 2013-02-01 DIAGNOSIS — M5030 Other cervical disc degeneration, unspecified cervical region: Secondary | ICD-10-CM | POA: Diagnosis not present

## 2013-02-01 DIAGNOSIS — M62838 Other muscle spasm: Secondary | ICD-10-CM | POA: Diagnosis not present

## 2013-02-01 DIAGNOSIS — M9981 Other biomechanical lesions of cervical region: Secondary | ICD-10-CM | POA: Diagnosis not present

## 2013-02-01 DIAGNOSIS — M999 Biomechanical lesion, unspecified: Secondary | ICD-10-CM | POA: Diagnosis not present

## 2013-02-16 ENCOUNTER — Other Ambulatory Visit: Payer: Self-pay | Admitting: *Deleted

## 2013-02-16 MED ORDER — TRIAMTERENE-HCTZ 37.5-25 MG PO TABS: 0.5000 | ORAL_TABLET | Freq: Every day | ORAL | Status: DC

## 2013-02-21 DIAGNOSIS — Z23 Encounter for immunization: Secondary | ICD-10-CM | POA: Diagnosis not present

## 2013-02-21 DIAGNOSIS — M9981 Other biomechanical lesions of cervical region: Secondary | ICD-10-CM | POA: Diagnosis not present

## 2013-02-21 DIAGNOSIS — M999 Biomechanical lesion, unspecified: Secondary | ICD-10-CM | POA: Diagnosis not present

## 2013-02-21 DIAGNOSIS — M62838 Other muscle spasm: Secondary | ICD-10-CM | POA: Diagnosis not present

## 2013-02-21 DIAGNOSIS — M5030 Other cervical disc degeneration, unspecified cervical region: Secondary | ICD-10-CM | POA: Diagnosis not present

## 2013-03-17 ENCOUNTER — Other Ambulatory Visit: Payer: Self-pay

## 2013-03-21 DIAGNOSIS — M9981 Other biomechanical lesions of cervical region: Secondary | ICD-10-CM | POA: Diagnosis not present

## 2013-03-21 DIAGNOSIS — M62838 Other muscle spasm: Secondary | ICD-10-CM | POA: Diagnosis not present

## 2013-03-21 DIAGNOSIS — M999 Biomechanical lesion, unspecified: Secondary | ICD-10-CM | POA: Diagnosis not present

## 2013-03-21 DIAGNOSIS — M5030 Other cervical disc degeneration, unspecified cervical region: Secondary | ICD-10-CM | POA: Diagnosis not present

## 2013-04-18 DIAGNOSIS — M5030 Other cervical disc degeneration, unspecified cervical region: Secondary | ICD-10-CM | POA: Diagnosis not present

## 2013-04-18 DIAGNOSIS — M9981 Other biomechanical lesions of cervical region: Secondary | ICD-10-CM | POA: Diagnosis not present

## 2013-04-18 DIAGNOSIS — M999 Biomechanical lesion, unspecified: Secondary | ICD-10-CM | POA: Diagnosis not present

## 2013-04-18 DIAGNOSIS — M62838 Other muscle spasm: Secondary | ICD-10-CM | POA: Diagnosis not present

## 2013-05-13 ENCOUNTER — Other Ambulatory Visit: Payer: Self-pay | Admitting: Orthopedic Surgery

## 2013-05-13 ENCOUNTER — Other Ambulatory Visit (INDEPENDENT_AMBULATORY_CARE_PROVIDER_SITE_OTHER): Payer: Medicare Other

## 2013-05-13 DIAGNOSIS — E785 Hyperlipidemia, unspecified: Secondary | ICD-10-CM | POA: Diagnosis not present

## 2013-05-13 DIAGNOSIS — R5383 Other fatigue: Principal | ICD-10-CM

## 2013-05-13 DIAGNOSIS — R5381 Other malaise: Secondary | ICD-10-CM | POA: Diagnosis not present

## 2013-05-13 LAB — COMPREHENSIVE METABOLIC PANEL
ALK PHOS: 52 U/L (ref 39–117)
ALT: 28 U/L (ref 0–35)
AST: 25 U/L (ref 0–37)
Albumin: 3.7 g/dL (ref 3.5–5.2)
BUN: 20 mg/dL (ref 6–23)
CO2: 29 mEq/L (ref 19–32)
CREATININE: 0.9 mg/dL (ref 0.4–1.2)
Calcium: 9.2 mg/dL (ref 8.4–10.5)
Chloride: 104 mEq/L (ref 96–112)
GFR: 64.03 mL/min (ref >60.00)
Glucose, Bld: 97 mg/dL (ref 70–99)
POTASSIUM: 3.9 meq/L (ref 3.5–5.1)
Sodium: 140 mEq/L (ref 135–145)
Total Bilirubin: 0.6 mg/dL (ref 0.3–1.2)
Total Protein: 7.2 g/dL (ref 6.0–8.3)

## 2013-05-13 LAB — CBC WITH DIFFERENTIAL/PLATELET
Basophils Absolute: 0 10*3/uL (ref 0.0–0.1)
Basophils Relative: 0.3 % (ref 0.0–3.0)
EOS PCT: 6.1 % — AB (ref 0.0–5.0)
Eosinophils Absolute: 0.3 10*3/uL (ref 0.0–0.7)
HEMATOCRIT: 38.1 % (ref 36.0–46.0)
HEMOGLOBIN: 12.8 g/dL (ref 12.0–15.0)
LYMPHS ABS: 2.3 10*3/uL (ref 0.7–4.0)
Lymphocytes Relative: 41.6 % (ref 12.0–46.0)
MCHC: 33.7 g/dL (ref 30.0–36.0)
MCV: 100.6 fl — ABNORMAL HIGH (ref 78.0–100.0)
MONOS PCT: 8.1 % (ref 3.0–12.0)
Monocytes Absolute: 0.5 10*3/uL (ref 0.1–1.0)
Neutro Abs: 2.4 10*3/uL (ref 1.4–7.7)
Neutrophils Relative %: 43.9 % (ref 43.0–77.0)
PLATELETS: 157 10*3/uL (ref 150.0–400.0)
RBC: 3.79 Mil/uL — AB (ref 3.87–5.11)
RDW: 13.1 % (ref 11.5–14.6)
WBC: 5.6 10*3/uL (ref 4.5–10.5)

## 2013-05-13 LAB — LIPID PANEL
CHOLESTEROL: 162 mg/dL (ref 0–200)
HDL: 52.5 mg/dL (ref >39.00)
LDL CALC: 84 mg/dL (ref 0–99)
Total CHOL/HDL Ratio: 3
Triglycerides: 126 mg/dL (ref 0.0–149.0)
VLDL: 25.2 mg/dL (ref 0.0–40.0)

## 2013-05-13 LAB — VITAMIN B12: VITAMIN B 12: 992 pg/mL — AB (ref 211–911)

## 2013-05-16 DIAGNOSIS — M9981 Other biomechanical lesions of cervical region: Secondary | ICD-10-CM | POA: Diagnosis not present

## 2013-05-16 DIAGNOSIS — M62838 Other muscle spasm: Secondary | ICD-10-CM | POA: Diagnosis not present

## 2013-05-16 DIAGNOSIS — S62639A Displaced fracture of distal phalanx of unspecified finger, initial encounter for closed fracture: Secondary | ICD-10-CM | POA: Diagnosis not present

## 2013-05-16 DIAGNOSIS — M5030 Other cervical disc degeneration, unspecified cervical region: Secondary | ICD-10-CM | POA: Diagnosis not present

## 2013-05-16 DIAGNOSIS — M999 Biomechanical lesion, unspecified: Secondary | ICD-10-CM | POA: Diagnosis not present

## 2013-05-20 ENCOUNTER — Encounter: Payer: Self-pay | Admitting: Family Medicine

## 2013-05-20 ENCOUNTER — Ambulatory Visit: Payer: Medicare Other | Admitting: Family Medicine

## 2013-05-20 ENCOUNTER — Ambulatory Visit (INDEPENDENT_AMBULATORY_CARE_PROVIDER_SITE_OTHER): Payer: Medicare Other | Admitting: Family Medicine

## 2013-05-20 VITALS — BP 120/82 | HR 60 | Temp 98.5°F | Ht 65.0 in | Wt 206.8 lb

## 2013-05-20 DIAGNOSIS — Z01818 Encounter for other preprocedural examination: Secondary | ICD-10-CM | POA: Diagnosis not present

## 2013-05-20 DIAGNOSIS — J3489 Other specified disorders of nose and nasal sinuses: Secondary | ICD-10-CM

## 2013-05-20 DIAGNOSIS — R0981 Nasal congestion: Secondary | ICD-10-CM | POA: Insufficient documentation

## 2013-05-20 NOTE — Progress Notes (Signed)
   Subjective:    Patient ID: Darlene Lawrence, female    DOB: [birth year], 73 y.o.   MRN: [id number]  HPI  73 year old female with history of RBBB, thrombocytopenia, HTN and high cholesterol  (on crestor and HCTZ) presents for surgical pre op eval.  She is scheduled for right total knee arthroplasty on 1/26. Dr. Maureen Ralphs.  She tolerate left hip replacement 3 years ago without complications.    Her BP is well controlled. BP Readings from Last 3 Encounters:  05/20/13 120/82  12/06/12 136/77  11/11/12 128/80  She denies Chest pain, SB, edema or dizziness.  She has no hx of CAD or pulmonary issues.  Recent labs on 1/2 were normal. No thrombocytopenia at this time.  Electrolytes, kidney and liver function stable.     Review of Systems  Constitutional: Negative for fever and fatigue.  HENT: Negative for ear pain.   Eyes: Negative for pain.  Respiratory: Negative for chest tightness and shortness of breath.   Cardiovascular: Negative for chest pain, palpitations and leg swelling.  Gastrointestinal: Negative for abdominal pain.  Genitourinary: Negative for dysuria.       Objective:   Physical Exam  Constitutional: Vital signs are normal. She appears well-developed and well-nourished. She is cooperative.  Non-toxic appearance. She does not appear ill. No distress.  HENT:  Head: Normocephalic.  Right Ear: Hearing, tympanic membrane, external ear and ear canal normal. Tympanic membrane is not erythematous, not retracted and not bulging.  Left Ear: Hearing, tympanic membrane, external ear and ear canal normal. Tympanic membrane is not erythematous, not retracted and not bulging.  Nose: No mucosal edema or rhinorrhea. Right sinus exhibits no maxillary sinus tenderness and no frontal sinus tenderness. Left sinus exhibits no maxillary sinus tenderness and no frontal sinus tenderness.  Mouth/Throat: Uvula is midline, oropharynx is clear and moist and mucous membranes are normal.  Eyes:  Conjunctivae, EOM and lids are normal. Pupils are equal, round, and reactive to light. Lids are everted and swept, no foreign bodies found.  Neck: Trachea normal and normal range of motion. Neck supple. Carotid bruit is not present. No mass and no thyromegaly present.  Cardiovascular: Normal rate, regular rhythm, S1 normal, S2 normal, normal heart sounds, intact distal pulses and normal pulses.  Exam reveals no gallop and no friction rub.   No murmur heard. Pulmonary/Chest: Effort normal and breath sounds normal. Not tachypneic. No respiratory distress. She has no decreased breath sounds. She has no wheezes. She has no rhonchi. She has no rales.  Abdominal: Soft. Normal appearance and bowel sounds are normal. There is no tenderness.  Neurological: She is alert.  Skin: Skin is warm, dry and intact. No rash noted.  Psychiatric: Her speech is normal and behavior is normal. Judgment and thought content normal. Her mood appears not anxious. Cognition and memory are normal. She does not exhibit a depressed mood.          Assessment & Plan:

## 2013-05-20 NOTE — Assessment & Plan Note (Signed)
Pt is low risk for this minimally invasive surgery. Reent labs are normal.  EKG should be verified at hosp pre op eval.

## 2013-05-20 NOTE — Assessment & Plan Note (Signed)
Likely due to allergies.  Treat with  mucinex and nasal irrigation.

## 2013-05-20 NOTE — Progress Notes (Signed)
Pre-visit discussion using our clinic review tool. No additional management support is needed unless otherwise documented below in the visit note.  

## 2013-05-20 NOTE — Patient Instructions (Signed)
Start nasal saline and mucinex plain at night prior to sleep. Call if not improving in next week.. Call for a possible steroid spray.

## 2013-05-25 ENCOUNTER — Encounter (HOSPITAL_COMMUNITY): Payer: Self-pay | Admitting: Pharmacy Technician

## 2013-05-26 DIAGNOSIS — S6390XA Sprain of unspecified part of unspecified wrist and hand, initial encounter: Secondary | ICD-10-CM | POA: Diagnosis not present

## 2013-05-26 DIAGNOSIS — M20019 Mallet finger of unspecified finger(s): Secondary | ICD-10-CM | POA: Diagnosis not present

## 2013-05-26 DIAGNOSIS — S62639A Displaced fracture of distal phalanx of unspecified finger, initial encounter for closed fracture: Secondary | ICD-10-CM | POA: Diagnosis not present

## 2013-05-26 DIAGNOSIS — M79609 Pain in unspecified limb: Secondary | ICD-10-CM | POA: Diagnosis not present

## 2013-05-30 ENCOUNTER — Ambulatory Visit (HOSPITAL_COMMUNITY)
Admission: RE | Admit: 2013-05-30 | Discharge: 2013-05-30 | Disposition: A | Discharge status: Home / Self Care | Payer: Medicare Other | Source: Ambulatory Visit | Attending: Orthopedic Surgery | Admitting: Orthopedic Surgery

## 2013-05-30 ENCOUNTER — Encounter (HOSPITAL_COMMUNITY): Payer: Self-pay

## 2013-05-30 ENCOUNTER — Encounter (HOSPITAL_COMMUNITY)
Admission: RE | Admit: 2013-05-30 | Discharge: 2013-05-30 | Disposition: A | Discharge status: Home / Self Care | Payer: Medicare Other | Source: Ambulatory Visit | Attending: Orthopedic Surgery | Admitting: Orthopedic Surgery

## 2013-05-30 DIAGNOSIS — M1710 Unilateral primary osteoarthritis, unspecified knee: Secondary | ICD-10-CM | POA: Insufficient documentation

## 2013-05-30 DIAGNOSIS — Z01810 Encounter for preprocedural cardiovascular examination: Secondary | ICD-10-CM | POA: Insufficient documentation

## 2013-05-30 DIAGNOSIS — Z0183 Encounter for blood typing: Secondary | ICD-10-CM | POA: Diagnosis not present

## 2013-05-30 DIAGNOSIS — Z01812 Encounter for preprocedural laboratory examination: Secondary | ICD-10-CM | POA: Insufficient documentation

## 2013-05-30 DIAGNOSIS — Z01818 Encounter for other preprocedural examination: Secondary | ICD-10-CM | POA: Insufficient documentation

## 2013-05-30 HISTORY — DX: Other intervertebral disc degeneration, lumbar region: M51.36

## 2013-05-30 HISTORY — DX: Unspecified tear of unspecified meniscus, current injury, unspecified knee, initial encounter: S83.209A

## 2013-05-30 HISTORY — DX: Other intervertebral disc degeneration, lumbar region without mention of lumbar back pain or lower extremity pain: M51.369

## 2013-05-30 HISTORY — DX: Unspecified right bundle-branch block: I45.10

## 2013-05-30 LAB — URINALYSIS, ROUTINE W REFLEX MICROSCOPIC
Bilirubin Urine: NEGATIVE
Glucose, UA: NEGATIVE mg/dL
Hgb urine dipstick: NEGATIVE
Ketones, ur: NEGATIVE mg/dL
NITRITE: NEGATIVE
PH: 5 (ref 5.0–8.0)
Protein, ur: NEGATIVE mg/dL
Specific Gravity, Urine: 1.009 (ref 1.005–1.030)
Urobilinogen, UA: 0.2 mg/dL (ref 0.0–1.0)

## 2013-05-30 LAB — URINE MICROSCOPIC-ADD ON

## 2013-05-30 LAB — SURGICAL PCR SCREEN
MRSA, PCR: NEGATIVE
STAPHYLOCOCCUS AUREUS: NEGATIVE

## 2013-05-30 LAB — COMPREHENSIVE METABOLIC PANEL
ALT: 24 U/L (ref 0–35)
AST: 20 U/L (ref 0–37)
Albumin: 3.5 g/dL (ref 3.5–5.2)
Alkaline Phosphatase: 64 U/L (ref 39–117)
BILIRUBIN TOTAL: 0.3 mg/dL (ref 0.3–1.2)
BUN: 23 mg/dL (ref 6–23)
CHLORIDE: 101 meq/L (ref 96–112)
CO2: 26 mEq/L (ref 19–32)
Calcium: 9.1 mg/dL (ref 8.4–10.5)
Creatinine, Ser: 0.87 mg/dL (ref 0.50–1.10)
GFR calc Af Amer: 76 mL/min — ABNORMAL LOW (ref >90)
GFR calc non Af Amer: 66 mL/min — ABNORMAL LOW (ref >90)
Glucose, Bld: 94 mg/dL (ref 70–99)
Potassium: 4.4 mEq/L (ref 3.7–5.3)
SODIUM: 138 meq/L (ref 137–147)
Total Protein: 7.5 g/dL (ref 6.0–8.3)

## 2013-05-30 LAB — CBC
HCT: 38.5 % (ref 36.0–46.0)
Hemoglobin: 12.9 g/dL (ref 12.0–15.0)
MCH: 33.4 pg (ref 26.0–34.0)
MCHC: 33.5 g/dL (ref 30.0–36.0)
MCV: 99.7 fL (ref 78.0–100.0)
PLATELETS: 171 10*3/uL (ref 150–400)
RBC: 3.86 MIL/uL — AB (ref 3.87–5.11)
RDW: 13 % (ref 11.5–15.5)
WBC: 4.6 10*3/uL (ref 4.0–10.5)

## 2013-05-30 LAB — PROTIME-INR
INR: 1.06 (ref 0.00–1.49)
Prothrombin Time: 13.6 seconds (ref 11.6–15.2)

## 2013-05-30 LAB — APTT: APTT: 29 s (ref 24–37)

## 2013-05-30 NOTE — Patient Instructions (Addendum)
[place]  05/30/2013   Your procedure is scheduled on: 06/06/13  Report to [facility] at 5:15 AM.  Call this number if you have problems the morning of surgery [phone]: [phone]   Remember:   Do not eat food or drink liquids After Midnight.   Do not wear jewelry, make-up or nail polish.  Do not wear lotions, powders, or perfumes. You may wear deodorant.  Do not shave 48 hours prior to surgery. Men may shave face and neck.  Do not bring valuables to the hospital.  Contacts, dentures or bridgework may not be worn into surgery.  Leave suitcase in the car. After surgery it may be brought to your room.  For patients admitted to the hospital, checkout time is 11:00 AM the day of discharge.    Please read over the following fact sheets that you were given: MRSA Information, incentive spirometry fact sheet, blood fact sheet Paulette Blanch, RN  pre op nurse call if needed [phone]    FAILURE TO [place]   Patient Signature: ___________________________________________

## 2013-05-30 NOTE — Progress Notes (Signed)
Surgery clearance note Dr. Diona Browner 11/11/12 on chart

## 2013-06-03 ENCOUNTER — Telehealth: Payer: Self-pay | Admitting: *Deleted

## 2013-06-03 DIAGNOSIS — N959 Unspecified menopausal and perimenopausal disorder: Secondary | ICD-10-CM

## 2013-06-03 MED ORDER — ESTRADIOL 2 MG VA RING: 2.0000 mg | VAGINAL_RING | VAGINAL | Status: DC

## 2013-06-03 NOTE — Telephone Encounter (Signed)
Pt needs refill on Estring.  Sent in.

## 2013-06-05 ENCOUNTER — Other Ambulatory Visit: Payer: Self-pay | Admitting: Orthopedic Surgery

## 2013-06-05 NOTE — H&P (Signed)
Darlene Lawrence  DOB: 06/25/1940 Married / Language: English / Race: White Female  Date of Admission:  06-06-2013  Chief Complaint:  Right Knee Pain  History of Present Illness The patient is a 72 year old female who comes in for a preoperative History and Physical. The patient is scheduled for a right total knee arthroplasty to be performed by Dr. Frank V. Aluisio, MD at [facility] on 06-06-2013. The patient is a 72 year old female who presents for follow up of their knee. The patient is being followed for their right knee pain and osteoarthritis. They are now out from Synvisc series. Symptoms reported today include: pain and aching. The patient feels that they are doing poorly and report their pain level to be moderate. The following medication has been used for pain control: antiinflammatory medication (Aleve occasionally). The patient has not gotten any relief of their symptoms with viscosupplementation. She states that the visco supplements did not provide much benefit. Most of her pain is on the lateral side of the knee. The knee also wants to give out on her. She can not bend or squat. It has not locked up but it gets that feeling at times. It has become more difficult to get around. She is ready to proceed with the right knee replacement. They have been treated conservatively in the past for the above stated problem and despite conservative measures, they continue to have progressive pain and severe functional limitations and dysfunction. They have failed non-operative management including home exercise, medications, and injections. It is felt that they would benefit from undergoing total joint replacement. Risks and benefits of the procedure have been discussed with the patient and they elect to proceed with surgery. There are no active contraindications to surgery such as ongoing infection or rapidly progressive neurological disease.   Allergies SUPRAX.  Hives. Penicillins. Rash.  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 04/25/2009   Family History Severe allergy. sister and brother Cancer. Mother. mother Hypertension. father Heart Disease. father    Social History Tobacco use. Never smoker. never smoker Alcohol use. current drinker; drinks wine; only occasionally per week Living situation. live with spouse Illicit drug use. no Number of flights of stairs before winded. 2-3 Marital status. married Current work status. retired Children. 2 Exercise. Exercises weekly; does other Drug/Alcohol Rehab (Currently). no Pain Contract. no Tobacco / smoke exposure. no Previously in rehab. no Post-Surgical Plans. Home Advance Directives. Living Will, Healthcare POA    Medication History Triamterene-HCTZ (37.5-25MG Tablet, Oral) Active. Estring (2MG Ring, Vaginal) Active. Crestor (10MG Tablet, Oral) Active. Aspirin Childrens (81MG Tablet Chewable, Oral) Active.    Past Surgical History Tubal Ligation Total Hip Replacement. left Dilation and Curettage of Uterus Breast Mass; Local Excision. right Breast Biopsy. right S/P total hip arthroplasty (V43.64)  Review of Systems General:Not Present- Chills, Fever, Night Sweats, Fatigue, Weight Gain, Weight Loss and Memory Loss. Skin:Not Present- Hives, Itching, Rash, Eczema and Lesions. HEENT:Not Present- Tinnitus, Headache, Double Vision, Visual Loss, Hearing Loss and Dentures. Respiratory:Not Present- Shortness of breath with exertion, Shortness of breath at rest, Allergies, Coughing up blood and Chronic  Cough. Cardiovascular:Not Present- Chest Pain, Racing/skipping heartbeats, Difficulty Breathing Lying Down, Murmur, Swelling and Palpitations. Gastrointestinal:Not Present- Bloody Stool, Heartburn, Abdominal Pain, Vomiting, Nausea, Constipation, Diarrhea, Difficulty Swallowing,   Jaundice and Loss of appetitie. Female Genitourinary:Present- Incontinence (stress). Not Present- Blood in Urine, Urinary frequency, Weak urinary stream, Discharge, Flank Pain, Painful Urination, Urgency, Urinary Retention and Urinating at Night. Musculoskeletal:Present- Joint Pain. Not Present- Muscle Weakness, Muscle Pain, Joint Swelling, Back Pain, Morning Stiffness and Spasms. Neurological:Not Present- Tremor, Dizziness, Blackout spells, Paralysis, Difficulty with balance and Weakness. Psychiatric:Not Present- Insomnia.    Vitals Weight: 207.8 lb Height: 65 in Weight was reported by patient. Height was reported by patient. Body Surface Area: 2.08 m Body Mass Index: 34.58 kg/m Pulse: 64 (Regular) Resp.: 14 (Unlabored) BP: 152/78 (Sitting, Left Arm, Standard)     Physical Exam The physical exam findings are as follows:   General Mental Status - Alert, cooperative and good historian. General Appearance- pleasant. Not in acute distress. Orientation- Oriented X3. Build & Nutrition- Well nourished and Well developed.   Head and Neck Head- normocephalic, atraumatic . Neck Global Assessment- supple. no bruit auscultated on the right and no bruit auscultated on the left.   Eye Vision- Wears corrective lenses. Pupil- Bilateral- Regular and Round. Motion- Bilateral- EOMI.   Chest and Lung Exam Auscultation: Breath sounds:- clear at anterior chest wall and - clear at posterior chest wall. Adventitious sounds:- No Adventitious sounds.   Cardiovascular Auscultation:Rhythm- Regular rate and rhythm. Heart Sounds- S1 WNL and S2 WNL. Murmurs & Other Heart  Sounds:Auscultation of the heart reveals - No Murmurs.   Abdomen Palpation/Percussion:Tenderness- Abdomen is non-tender to palpation. Rigidity (guarding)- Abdomen is soft. Auscultation:Auscultation of the abdomen reveals - Bowel sounds normal.   Female Genitourinary Not done, not pertinent to present illness  Musculoskeletal On exam she is alert and oriented in no apparent distress. Her right knee shows no effusion. Her range is about 5 to 125. She is very tender on the lateral jointline and palpation there reproduces a lot of her pain. There is no medial tenderness or instability noted.  RADIOGRAPHS: AP both knees and lateral, she does have some lateral joint space narrowing but she is no where near bone on bone.   Assessment & Plan Primary osteoarthritis of one knee (715.16) Impression: Right Knee  Note: Plan is for a Right Total Knee Replacement by Dr. Wynelle Link.  Plan is to go home.  PCP - Dr. Eliezer Lofts  The patient will not receive TXA (tranexamic acid) due to: Breast Cancer  Signed electronically by Joelene Millin, III PA-C

## 2013-06-06 ENCOUNTER — Inpatient Hospital Stay (HOSPITAL_COMMUNITY): Payer: Medicare Other | Admitting: Anesthesiology

## 2013-06-06 ENCOUNTER — Inpatient Hospital Stay (HOSPITAL_COMMUNITY)
Admission: RE | Admit: 2013-06-06 | Discharge: 2013-06-08 | DRG: 470 | Disposition: A | Discharge status: Home Health | Payer: Medicare Other | Source: Ambulatory Visit | Attending: Orthopedic Surgery | Admitting: Orthopedic Surgery

## 2013-06-06 ENCOUNTER — Encounter (HOSPITAL_COMMUNITY): Payer: Medicare Other | Admitting: Anesthesiology

## 2013-06-06 ENCOUNTER — Encounter (HOSPITAL_COMMUNITY): Payer: Self-pay | Admitting: *Deleted

## 2013-06-06 ENCOUNTER — Encounter (HOSPITAL_COMMUNITY)
Admission: RE | Disposition: A | Discharge status: Home Health | Payer: Self-pay | Source: Ambulatory Visit | Attending: Orthopedic Surgery

## 2013-06-06 DIAGNOSIS — M25569 Pain in unspecified knee: Secondary | ICD-10-CM | POA: Diagnosis not present

## 2013-06-06 DIAGNOSIS — M179 Osteoarthritis of knee, unspecified: Secondary | ICD-10-CM | POA: Diagnosis present

## 2013-06-06 DIAGNOSIS — Z853 Personal history of malignant neoplasm of breast: Secondary | ICD-10-CM

## 2013-06-06 DIAGNOSIS — M1710 Unilateral primary osteoarthritis, unspecified knee: Secondary | ICD-10-CM | POA: Diagnosis not present

## 2013-06-06 DIAGNOSIS — I10 Essential (primary) hypertension: Secondary | ICD-10-CM | POA: Diagnosis present

## 2013-06-06 DIAGNOSIS — Z96651 Presence of right artificial knee joint: Secondary | ICD-10-CM

## 2013-06-06 DIAGNOSIS — E785 Hyperlipidemia, unspecified: Secondary | ICD-10-CM | POA: Diagnosis present

## 2013-06-06 DIAGNOSIS — D62 Acute posthemorrhagic anemia: Secondary | ICD-10-CM | POA: Diagnosis not present

## 2013-06-06 DIAGNOSIS — Z6834 Body mass index (BMI) 34.0-34.9, adult: Secondary | ICD-10-CM

## 2013-06-06 HISTORY — PX: TOTAL KNEE ARTHROPLASTY: SHX125

## 2013-06-06 LAB — TYPE AND SCREEN
ABO/RH(D): A POS
Antibody Screen: NEGATIVE

## 2013-06-06 SURGERY — ARTHROPLASTY, KNEE, TOTAL
Anesthesia: Spinal | Site: Knee | Laterality: Right

## 2013-06-06 MED ORDER — DEXAMETHASONE SODIUM PHOSPHATE 10 MG/ML IJ SOLN
INTRAMUSCULAR | Status: AC
  Filled 2013-06-06: qty 1

## 2013-06-06 MED ORDER — RIVAROXABAN 10 MG PO TABS
10.0000 mg | ORAL_TABLET | Freq: Every day | ORAL | Status: DC
  Administered 2013-06-07 – 2013-06-08 (×2): 10 mg via ORAL
  Filled 2013-06-06 (×3): qty 1

## 2013-06-06 MED ORDER — MIDAZOLAM HCL 5 MG/5ML IJ SOLN
INTRAMUSCULAR | Status: DC | PRN
  Administered 2013-06-06 (×2): 1 mg via INTRAVENOUS

## 2013-06-06 MED ORDER — POLYETHYLENE GLYCOL 3350 17 G PO PACK
17.0000 g | PACK | Freq: Every day | ORAL | Status: DC | PRN
  Administered 2013-06-07: 17 g via ORAL

## 2013-06-06 MED ORDER — PHENYLEPHRINE 40 MCG/ML (10ML) SYRINGE FOR IV PUSH (FOR BLOOD PRESSURE SUPPORT)
PREFILLED_SYRINGE | INTRAVENOUS | Status: AC
  Filled 2013-06-06: qty 10

## 2013-06-06 MED ORDER — PHENOL 1.4 % MT LIQD
1.0000 | OROMUCOSAL | Status: DC | PRN
  Filled 2013-06-06: qty 177

## 2013-06-06 MED ORDER — SODIUM CHLORIDE 0.9 % IR SOLN
Status: DC | PRN
  Administered 2013-06-06: 1000 mL

## 2013-06-06 MED ORDER — METHOCARBAMOL 500 MG PO TABS
500.0000 mg | ORAL_TABLET | Freq: Four times a day (QID) | ORAL | Status: DC | PRN
  Administered 2013-06-07 – 2013-06-08 (×3): 500 mg via ORAL
  Filled 2013-06-06 (×3): qty 1

## 2013-06-06 MED ORDER — BISACODYL 10 MG RE SUPP: 10.0000 mg | Freq: Every day | RECTAL | Status: DC | PRN

## 2013-06-06 MED ORDER — ACETAMINOPHEN 500 MG PO TABS
1000.0000 mg | ORAL_TABLET | Freq: Four times a day (QID) | ORAL | Status: AC
  Administered 2013-06-06 – 2013-06-07 (×4): 1000 mg via ORAL
  Filled 2013-06-06 (×4): qty 2

## 2013-06-06 MED ORDER — MENTHOL 3 MG MT LOZG
1.0000 | LOZENGE | OROMUCOSAL | Status: DC | PRN
  Filled 2013-06-06: qty 9

## 2013-06-06 MED ORDER — BUPIVACAINE LIPOSOME 1.3 % IJ SUSP
INTRAMUSCULAR | Status: DC | PRN
  Administered 2013-06-06: 20 mL

## 2013-06-06 MED ORDER — TRIAMTERENE-HCTZ 37.5-25 MG PO TABS
0.5000 | ORAL_TABLET | Freq: Every day | ORAL | Status: DC
  Administered 2013-06-06 – 2013-06-08 (×3): 0.5 via ORAL
  Filled 2013-06-06 (×3): qty 0.5

## 2013-06-06 MED ORDER — EPHEDRINE SULFATE 50 MG/ML IJ SOLN
INTRAMUSCULAR | Status: DC | PRN
  Administered 2013-06-06 (×4): 5 mg via INTRAVENOUS
  Administered 2013-06-06: 10 mg via INTRAVENOUS

## 2013-06-06 MED ORDER — FENTANYL CITRATE 0.05 MG/ML IJ SOLN
INTRAMUSCULAR | Status: AC
  Filled 2013-06-06: qty 2

## 2013-06-06 MED ORDER — DIPHENHYDRAMINE HCL 12.5 MG/5ML PO ELIX: 12.5000 mg | ORAL_SOLUTION | ORAL | Status: DC | PRN

## 2013-06-06 MED ORDER — VANCOMYCIN HCL IN DEXTROSE 1-5 GM/200ML-% IV SOLN
1000.0000 mg | Freq: Two times a day (BID) | INTRAVENOUS | Status: AC
  Administered 2013-06-06: 1000 mg via INTRAVENOUS
  Filled 2013-06-06: qty 200

## 2013-06-06 MED ORDER — LACTATED RINGERS IV SOLN
INTRAVENOUS | Status: DC | PRN
  Administered 2013-06-06 (×2): via INTRAVENOUS

## 2013-06-06 MED ORDER — BUPIVACAINE IN DEXTROSE 0.75-8.25 % IT SOLN
INTRATHECAL | Status: DC | PRN
  Administered 2013-06-06: 1.8 mL via INTRATHECAL

## 2013-06-06 MED ORDER — SODIUM CHLORIDE 0.9 % IV SOLN
INTRAVENOUS | Status: DC
  Administered 2013-06-06 – 2013-06-07 (×2): via INTRAVENOUS

## 2013-06-06 MED ORDER — DEXAMETHASONE 6 MG PO TABS
10.0000 mg | ORAL_TABLET | Freq: Every day | ORAL | Status: AC
  Administered 2013-06-07: 10 mg via ORAL
  Filled 2013-06-06: qty 1

## 2013-06-06 MED ORDER — TRAMADOL HCL 50 MG PO TABS
50.0000 mg | ORAL_TABLET | Freq: Four times a day (QID) | ORAL | Status: DC | PRN
  Administered 2013-06-06 – 2013-06-08 (×4): 100 mg via ORAL
  Filled 2013-06-06 (×4): qty 2

## 2013-06-06 MED ORDER — FLEET ENEMA 7-19 GM/118ML RE ENEM: 1.0000 | ENEMA | Freq: Once | RECTAL | Status: AC | PRN

## 2013-06-06 MED ORDER — LIDOCAINE HCL (CARDIAC) 20 MG/ML IV SOLN
INTRAVENOUS | Status: AC
  Filled 2013-06-06: qty 5

## 2013-06-06 MED ORDER — SODIUM CHLORIDE 0.9 % IV SOLN: INTRAVENOUS | Status: DC

## 2013-06-06 MED ORDER — METOCLOPRAMIDE HCL 5 MG/ML IJ SOLN: 5.0000 mg | Freq: Three times a day (TID) | INTRAMUSCULAR | Status: DC | PRN

## 2013-06-06 MED ORDER — DEXAMETHASONE SODIUM PHOSPHATE 10 MG/ML IJ SOLN
10.0000 mg | Freq: Every day | INTRAMUSCULAR | Status: AC
  Filled 2013-06-06: qty 1

## 2013-06-06 MED ORDER — PHENYLEPHRINE HCL 10 MG/ML IJ SOLN
INTRAMUSCULAR | Status: DC | PRN
  Administered 2013-06-06: 80 ug via INTRAVENOUS

## 2013-06-06 MED ORDER — PROPOFOL 10 MG/ML IV BOLUS
INTRAVENOUS | Status: AC
  Filled 2013-06-06: qty 20

## 2013-06-06 MED ORDER — SODIUM CHLORIDE 0.9 % IJ SOLN
INTRAMUSCULAR | Status: DC | PRN
  Administered 2013-06-06: 30 mL

## 2013-06-06 MED ORDER — ACETAMINOPHEN 650 MG RE SUPP: 650.0000 mg | Freq: Four times a day (QID) | RECTAL | Status: DC | PRN

## 2013-06-06 MED ORDER — BUPIVACAINE HCL (PF) 0.25 % IJ SOLN
INTRAMUSCULAR | Status: AC
  Filled 2013-06-06: qty 30

## 2013-06-06 MED ORDER — ACETAMINOPHEN 10 MG/ML IV SOLN
1000.0000 mg | Freq: Once | INTRAVENOUS | Status: AC
  Administered 2013-06-06: 1000 mg via INTRAVENOUS
  Filled 2013-06-06: qty 100

## 2013-06-06 MED ORDER — LACTATED RINGERS IV SOLN: INTRAVENOUS | Status: DC

## 2013-06-06 MED ORDER — DEXAMETHASONE SODIUM PHOSPHATE 10 MG/ML IJ SOLN
10.0000 mg | Freq: Once | INTRAMUSCULAR | Status: AC
  Administered 2013-06-06: 10 mg via INTRAVENOUS

## 2013-06-06 MED ORDER — METHOCARBAMOL 100 MG/ML IJ SOLN
500.0000 mg | Freq: Four times a day (QID) | INTRAVENOUS | Status: DC | PRN
  Administered 2013-06-06: 500 mg via INTRAVENOUS
  Filled 2013-06-06: qty 5

## 2013-06-06 MED ORDER — METOCLOPRAMIDE HCL 10 MG PO TABS: 5.0000 mg | ORAL_TABLET | Freq: Three times a day (TID) | ORAL | Status: DC | PRN

## 2013-06-06 MED ORDER — HYDROMORPHONE HCL PF 1 MG/ML IJ SOLN: 0.2500 mg | INTRAMUSCULAR | Status: DC | PRN

## 2013-06-06 MED ORDER — ONDANSETRON HCL 4 MG/2ML IJ SOLN
INTRAMUSCULAR | Status: DC | PRN
Start: 2013-06-06 — End: 2013-06-06
  Administered 2013-06-06: 4 mg via INTRAVENOUS

## 2013-06-06 MED ORDER — ACETAMINOPHEN 325 MG PO TABS: 650.0000 mg | ORAL_TABLET | Freq: Four times a day (QID) | ORAL | Status: DC | PRN

## 2013-06-06 MED ORDER — BUPIVACAINE HCL 0.25 % IJ SOLN
INTRAMUSCULAR | Status: DC | PRN
  Administered 2013-06-06: 20 mL

## 2013-06-06 MED ORDER — ONDANSETRON HCL 4 MG PO TABS: 4.0000 mg | ORAL_TABLET | Freq: Four times a day (QID) | ORAL | Status: DC | PRN

## 2013-06-06 MED ORDER — MORPHINE SULFATE 2 MG/ML IJ SOLN
1.0000 mg | INTRAMUSCULAR | Status: DC | PRN
  Administered 2013-06-06: 1 mg via INTRAVENOUS
  Filled 2013-06-06: qty 1

## 2013-06-06 MED ORDER — DOCUSATE SODIUM 100 MG PO CAPS
100.0000 mg | ORAL_CAPSULE | Freq: Two times a day (BID) | ORAL | Status: DC
  Administered 2013-06-06 – 2013-06-08 (×5): 100 mg via ORAL

## 2013-06-06 MED ORDER — ONDANSETRON HCL 4 MG/2ML IJ SOLN: 4.0000 mg | Freq: Four times a day (QID) | INTRAMUSCULAR | Status: DC | PRN

## 2013-06-06 MED ORDER — OXYCODONE HCL 5 MG PO TABS
5.0000 mg | ORAL_TABLET | ORAL | Status: DC | PRN
Start: 2013-06-06 — End: 2013-06-08
  Administered 2013-06-06 – 2013-06-08 (×10): 10 mg via ORAL
  Filled 2013-06-06 (×10): qty 2

## 2013-06-06 MED ORDER — ONDANSETRON HCL 4 MG/2ML IJ SOLN
INTRAMUSCULAR | Status: AC
  Filled 2013-06-06: qty 2

## 2013-06-06 MED ORDER — EPHEDRINE SULFATE 50 MG/ML IJ SOLN
INTRAMUSCULAR | Status: AC
  Filled 2013-06-06: qty 1

## 2013-06-06 MED ORDER — VANCOMYCIN HCL IN DEXTROSE 1-5 GM/200ML-% IV SOLN
1000.0000 mg | INTRAVENOUS | Status: AC
  Administered 2013-06-06: 1000 mg via INTRAVENOUS

## 2013-06-06 MED ORDER — MIDAZOLAM HCL 2 MG/2ML IJ SOLN
INTRAMUSCULAR | Status: AC
  Filled 2013-06-06: qty 2

## 2013-06-06 MED ORDER — KETOROLAC TROMETHAMINE 15 MG/ML IJ SOLN
7.5000 mg | Freq: Four times a day (QID) | INTRAMUSCULAR | Status: AC | PRN
  Administered 2013-06-06: 7.5 mg via INTRAVENOUS
  Filled 2013-06-06: qty 1

## 2013-06-06 MED ORDER — PROMETHAZINE HCL 25 MG/ML IJ SOLN: 6.2500 mg | INTRAMUSCULAR | Status: DC | PRN

## 2013-06-06 MED ORDER — SODIUM CHLORIDE 0.9 % IJ SOLN
INTRAMUSCULAR | Status: AC
  Filled 2013-06-06: qty 50

## 2013-06-06 MED ORDER — VANCOMYCIN HCL IN DEXTROSE 1-5 GM/200ML-% IV SOLN
INTRAVENOUS | Status: AC
  Filled 2013-06-06: qty 200

## 2013-06-06 MED ORDER — PROPOFOL INFUSION 10 MG/ML OPTIME
INTRAVENOUS | Status: DC | PRN
  Administered 2013-06-06: 25 ug/kg/min via INTRAVENOUS

## 2013-06-06 MED ORDER — FENTANYL CITRATE 0.05 MG/ML IJ SOLN
INTRAMUSCULAR | Status: DC | PRN
  Administered 2013-06-06 (×2): 50 ug via INTRAVENOUS

## 2013-06-06 MED ORDER — LIDOCAINE HCL (CARDIAC) 20 MG/ML IV SOLN
INTRAVENOUS | Status: DC | PRN
  Administered 2013-06-06: 50 mg via INTRAVENOUS

## 2013-06-06 MED ORDER — BUPIVACAINE LIPOSOME 1.3 % IJ SUSP
20.0000 mL | Freq: Once | INTRAMUSCULAR | Status: DC
  Filled 2013-06-06: qty 20

## 2013-06-06 SURGICAL SUPPLY — 54 items
BAG ZIPLOCK 12X15 (MISCELLANEOUS) ×3 IMPLANT
BANDAGE ELASTIC 6 VELCRO ST LF (GAUZE/BANDAGES/DRESSINGS) ×3 IMPLANT
BANDAGE ESMARK 6X9 LF (GAUZE/BANDAGES/DRESSINGS) ×1 IMPLANT
BLADE SAG 18X100X1.27 (BLADE) ×3 IMPLANT
BLADE SAW SGTL 11.0X1.19X90.0M (BLADE) ×3 IMPLANT
BNDG ESMARK 6X9 LF (GAUZE/BANDAGES/DRESSINGS) ×3
BOWL SMART MIX CTS (DISPOSABLE) ×3 IMPLANT
CAPT RP KNEE ×3 IMPLANT
CEMENT HV SMART SET (Cement) ×6 IMPLANT
CLOSURE WOUND 1/2 X4 (GAUZE/BANDAGES/DRESSINGS) ×1
CUFF TOURN SGL QUICK 34 (TOURNIQUET CUFF) ×2
CUFF TRNQT CYL 34X4X40X1 (TOURNIQUET CUFF) ×1 IMPLANT
DECANTER SPIKE VIAL GLASS SM (MISCELLANEOUS) ×3 IMPLANT
DRAPE EXTREMITY T 121X128X90 (DRAPE) ×3 IMPLANT
DRAPE POUCH INSTRU U-SHP 10X18 (DRAPES) ×3 IMPLANT
DRAPE U-SHAPE 47X51 STRL (DRAPES) ×3 IMPLANT
DRSG ADAPTIC 3X8 NADH LF (GAUZE/BANDAGES/DRESSINGS) ×3 IMPLANT
DURAPREP 26ML APPLICATOR (WOUND CARE) ×3 IMPLANT
ELECT REM PT RETURN 9FT ADLT (ELECTROSURGICAL) ×3
ELECTRODE REM PT RTRN 9FT ADLT (ELECTROSURGICAL) ×1 IMPLANT
EVACUATOR 1/8 PVC DRAIN (DRAIN) ×3 IMPLANT
FACESHIELD LNG OPTICON STERILE (SAFETY) ×15 IMPLANT
GLOVE BIO SURGEON STRL SZ7.5 (GLOVE) IMPLANT
GLOVE BIO SURGEON STRL SZ8 (GLOVE) ×3 IMPLANT
GLOVE BIOGEL PI IND STRL 8 (GLOVE) ×2 IMPLANT
GLOVE BIOGEL PI INDICATOR 8 (GLOVE) ×4
GLOVE SURG SS PI 6.5 STRL IVOR (GLOVE) IMPLANT
GOWN STRL REUS W/TWL LRG LVL3 (GOWN DISPOSABLE) ×9 IMPLANT
GOWN STRL REUS W/TWL XL LVL3 (GOWN DISPOSABLE) IMPLANT
HANDPIECE INTERPULSE COAX TIP (DISPOSABLE) ×2
IMMOBILIZER KNEE 20 (SOFTGOODS) ×3 IMPLANT
KIT BASIN OR (CUSTOM PROCEDURE TRAY) ×3 IMPLANT
MANIFOLD NEPTUNE II (INSTRUMENTS) ×3 IMPLANT
NDL SAFETY ECLIPSE 18X1.5 (NEEDLE) ×2 IMPLANT
NEEDLE HYPO 18GX1.5 SHARP (NEEDLE) ×4
NS IRRIG 1000ML POUR BTL (IV SOLUTION) ×3 IMPLANT
PACK TOTAL JOINT (CUSTOM PROCEDURE TRAY) ×3 IMPLANT
PAD ABD 8X10 STRL (GAUZE/BANDAGES/DRESSINGS) ×3 IMPLANT
PADDING CAST COTTON 6X4 STRL (CAST SUPPLIES) ×3 IMPLANT
POSITIONER SURGICAL ARM (MISCELLANEOUS) ×3 IMPLANT
SET HNDPC FAN SPRY TIP SCT (DISPOSABLE) ×1 IMPLANT
SPONGE GAUZE 4X4 12PLY (GAUZE/BANDAGES/DRESSINGS) ×3 IMPLANT
STRIP CLOSURE SKIN 1/2X4 (GAUZE/BANDAGES/DRESSINGS) ×2 IMPLANT
SUCTION FRAZIER 12FR DISP (SUCTIONS) ×3 IMPLANT
SUT MNCRL AB 4-0 PS2 18 (SUTURE) ×3 IMPLANT
SUT VIC AB 2-0 CT1 27 (SUTURE) ×6
SUT VIC AB 2-0 CT1 TAPERPNT 27 (SUTURE) ×3 IMPLANT
SUT VLOC 180 0 24IN GS25 (SUTURE) ×3 IMPLANT
SYR 20CC LL (SYRINGE) ×3 IMPLANT
SYR 50ML LL SCALE MARK (SYRINGE) ×3 IMPLANT
TOWEL OR 17X26 10 PK STRL BLUE (TOWEL DISPOSABLE) ×6 IMPLANT
TRAY FOLEY CATH 14FRSI W/METER (CATHETERS) ×3 IMPLANT
WATER STERILE IRR 1500ML POUR (IV SOLUTION) ×3 IMPLANT
WRAP KNEE MAXI GEL POST OP (GAUZE/BANDAGES/DRESSINGS) ×3 IMPLANT

## 2013-06-06 NOTE — Transfer of Care (Signed)
Immediate Anesthesia Transfer of Care Note  Patient: Darlene Lawrence  Procedure(s) Performed: Procedure(s): RIGHT TOTAL KNEE ARTHROPLASTY (Right)  Patient Location: PACU  Anesthesia Type:Spinal  Level of Consciousness: awake, alert , oriented and patient cooperative  Airway & Oxygen Therapy: Patient Spontanous Breathing and Patient connected to face mask oxygen  Post-op Assessment: Report given to PACU RN and Post -op Vital signs reviewed and stable  Post vital signs: Reviewed and stable  Complications: No apparent anesthesia complications

## 2013-06-06 NOTE — Op Note (Signed)
Pre-operative diagnosis- Osteoarthritis  Right knee(s)  Post-operative diagnosis- Osteoarthritis Right knee(s)  Procedure-  Right  Total Knee Arthroplasty  Surgeon- Dione Plover. Bharath Bernstein, MD  Assistant- Ardeen Jourdain, PA-C   Anesthesia-  Spinal EBL-* No blood loss amount entered *  Drains Hemovac  Tourniquet time-  Total Tourniquet Time Documented: Thigh (Right) - 35 minutes Total: Thigh (Right) - 35 minutes    Complications- None  Condition-PACU - hemodynamically stable.   Brief Clinical Note  Darlene Lawrence is a 73 y.o. year old female with end stage OA of her right knee with progressively worsening pain and dysfunction. She has constant pain, with activity and at rest and significant functional deficits with difficulties even with ADLs. She has had extensive non-op management including analgesics, injections of cortisone and viscosupplements, and home exercise program, but remains in significant pain with significant dysfunction.Radiographs show bone on bone arthritis lateral and patellofemoal. She presents now for right Total Knee Arthroplasty.    Procedure in detail---   The patient is brought into the operating room and positioned supine on the operating table. After successful administration of  Spinal,   a tourniquet is placed high on the Right thigh(s) and the lower extremity is prepped and draped in the usual sterile fashion. Time out is performed by the operating team and then the  right lower extremity is wrapped in Esmarch, knee flexed and the tourniquet inflated to 300 mmHg.       A midline incision is made with a ten blade through the subcutaneous tissue to the level of the extensor mechanism. A fresh blade is used to make a medial parapatellar arthrotomy. Soft tissue over the proximal medial tibia is subperiosteally elevated to the joint line with a knife and into the semimembranosus bursa with a Cobb elevator. Soft tissue over the proximal lateral tibia is elevated with  attention being paid to avoiding the patellar tendon on the tibial tubercle. The patella is everted, knee flexed 90 degrees and the ACL and PCL are removed. Findings are bone on bone lateral and patellofemoral with global osteophytes.        The drill is used to create a starting hole in the distal femur and the canal is thoroughly irrigated with sterile saline to remove the fatty contents. The 5 degree Right  valgus alignment guide is placed into the femoral canal and the distal femoral cutting block is pinned to remove 10 mm off the distal femur. Resection is made with an oscillating saw.      The tibia is subluxed forward and the menisci are removed. The extramedullary alignment guide is placed referencing proximally at the medial aspect of the tibial tubercle and distally along the second metatarsal axis and tibial crest. The block is pinned to remove 57mm off the more deficient lateral  side. Resection is made with an oscillating saw. Size 3is the most appropriate size for the tibia and the proximal tibia is prepared with the modular drill and keel punch for that size.      The femoral sizing guide is placed and size 4 narrow  is most appropriate. Rotation is marked off the epicondylar axis and confirmed by creating a rectangular flexion gap at 90 degrees. The size 4 cutting block is pinned in this rotation and the anterior, posterior and chamfer cuts are made with the oscillating saw. The intercondylar block is then placed and that cut is made.      Trial size 3 tibial component, trial size 4 narrow  posterior stabilized femur and a 12.5  mm posterior stabilized rotating platform insert trial is placed. Full extension is achieved with excellent varus/valgus and anterior/posterior balance throughout full range of motion. The patella is everted and thickness measured to be 21  mm. Free hand resection is taken to 12 mm, a 35 template is placed, lug holes are drilled, trial patella is placed, and it tracks  normally. Osteophytes are removed off the posterior femur with the trial in place. All trials are removed and the cut bone surfaces prepared with pulsatile lavage. Cement is mixed and once ready for implantation, the size 3 tibial implant, size  4 narrow posterior stabilized femoral component, and the size 35 patella are cemented in place and the patella is held with the clamp. The trial insert is placed and the knee held in full extension. The Exparel (20 ml mixed with 30 ml saline) and .25% Bupivicaine, are injected into the extensor mechanism, posterior capsule, medial and lateral gutters and subcutaneous tissues.  All extruded cement is removed and once the cement is hard the permanent 12.5 mm posterior stabilized rotating platform insert is placed into the tibial tray.      The wound is copiously irrigated with saline solution and the extensor mechanism closed over a hemovac drain with #1 PDS suture. The tourniquet is released for a total tourniquet time of 34  minutes. Flexion against gravity is 135 degrees and the patella tracks normally. Subcutaneous tissue is closed with 2.0 vicryl and subcuticular with running 4.0 Monocryl. The incision is cleaned and dried and steri-strips and a bulky sterile dressing are applied. The limb is placed into a knee immobilizer and the patient is awakened and transported to recovery in stable condition.      Please note that a surgical assistant was a medical necessity for this procedure in order to perform it in a safe and expeditious manner. Surgical assistant was necessary to retract the ligaments and vital neurovascular structures to prevent injury to them and also necessary for proper positioning of the limb to allow for anatomic placement of the prosthesis.   Dione Plover Jaspreet Hollings, MD    06/06/2013, 8:14 AM

## 2013-06-06 NOTE — H&P (View-Only) (Signed)
Darlene Lawrence  DOB: Apr 21, 1941 Married / Language: English / Race: White Female  Date of Admission:  06-06-2013  Chief Complaint:  Right Knee Pain  History of Present Illness The patient is a 73 year old female who comes in for a preoperative History and Physical. The patient is scheduled for a right total knee arthroplasty to be performed by Dr. Dione Plover. Aluisio, MD at [facility] on 06-06-2013. The patient is a 73 year old female who presents for follow up of their knee. The patient is being followed for their right knee pain and osteoarthritis. They are now out from Synvisc series. Symptoms reported today include: pain and aching. The patient feels that they are doing poorly and report their pain level to be moderate. The following medication has been used for pain control: antiinflammatory medication (Aleve occasionally). The patient has not gotten any relief of their symptoms with viscosupplementation. She states that the visco supplements did not provide much benefit. Most of her pain is on the lateral side of the knee. The knee also wants to give out on her. She can not bend or squat. It has not locked up but it gets that feeling at times. It has become more difficult to get around. She is ready to proceed with the right knee replacement. They have been treated conservatively in the past for the above stated problem and despite conservative measures, they continue to have progressive pain and severe functional limitations and dysfunction. They have failed non-operative management including home exercise, medications, and injections. It is felt that they would benefit from undergoing total joint replacement. Risks and benefits of the procedure have been discussed with the patient and they elect to proceed with surgery. There are no active contraindications to surgery such as ongoing infection or rapidly progressive neurological disease.   Allergies SUPRAX.  Hives. Penicillins. Rash.  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 04/25/2009   Family History Severe allergy. sister and brother Cancer. Mother. mother Hypertension. father Heart Disease. father    Social History Tobacco use. Never smoker. never smoker Alcohol use. current drinker; drinks wine; only occasionally per week Living situation. live with spouse Illicit drug use. no Number of flights of stairs before winded. 2-3 Marital status. married Current work status. retired Careers information officer. 2 Exercise. Exercises weekly; does other Drug/Alcohol Rehab (Currently). no Pain Contract. no Tobacco / smoke exposure. no Previously in rehab. no Post-Surgical Plans. Home Advance Directives. Living Will, Healthcare POA    Medication History Triamterene-HCTZ (37.5-25MG  Tablet, Oral) Active. Estring (2MG  Ring, Vaginal) Active. Crestor (10MG  Tablet, Oral) Active. Aspirin Childrens (81MG  Tablet Chewable, Oral) Active.    Past Surgical History Tubal Ligation Total Hip Replacement. left Dilation and Curettage of Uterus Breast Mass; Local Excision. right Breast Biopsy. right S/P total hip arthroplasty (V43.64)  Review of Systems General:Not Present- Chills, Fever, Night Sweats, Fatigue, Weight Gain, Weight Loss and Memory Loss. Skin:Not Present- Hives, Itching, Rash, Eczema and Lesions. HEENT:Not Present- Tinnitus, Headache, Double Vision, Visual Loss, Hearing Loss and Dentures. Respiratory:Not Present- Shortness of breath with exertion, Shortness of breath at rest, Allergies, Coughing up blood and Chronic  Cough. Cardiovascular:Not Present- Chest Pain, Racing/skipping heartbeats, Difficulty Breathing Lying Down, Murmur, Swelling and Palpitations. Gastrointestinal:Not Present- Bloody Stool, Heartburn, Abdominal Pain, Vomiting, Nausea, Constipation, Diarrhea, Difficulty Swallowing,  Jaundice and Loss of appetitie. Female Genitourinary:Present- Incontinence (stress). Not Present- Blood in Urine, Urinary frequency, Weak urinary stream, Discharge, Flank Pain, Painful Urination, Urgency, Urinary Retention and Urinating at Night. Musculoskeletal:Present- Joint Pain. Not Present- Muscle Weakness, Muscle Pain, Joint Swelling, Back Pain, Morning Stiffness and Spasms. Neurological:Not Present- Tremor, Dizziness, Blackout spells, Paralysis, Difficulty with balance and Weakness. Psychiatric:Not Present- Insomnia.    Vitals Weight: 207.8 lb Height: 65 in Weight was reported by patient. Height was reported by patient. Body Surface Area: 2.08 m Body Mass Index: 34.58 kg/m Pulse: 64 (Regular) Resp.: 14 (Unlabored) BP: 152/78 (Sitting, Left Arm, Standard)     Physical Exam The physical exam findings are as follows:   General Mental Status - Alert, cooperative and good historian. General Appearance- pleasant. Not in acute distress. Orientation- Oriented X3. Build & Nutrition- Well nourished and Well developed.   Head and Neck Head- normocephalic, atraumatic . Neck Global Assessment- supple. no bruit auscultated on the right and no bruit auscultated on the left.   Eye Vision- Wears corrective lenses. Pupil- Bilateral- Regular and Round. Motion- Bilateral- EOMI.   Chest and Lung Exam Auscultation: Breath sounds:- clear at anterior chest wall and - clear at posterior chest wall. Adventitious sounds:- No Adventitious sounds.   Cardiovascular Auscultation:Rhythm- Regular rate and rhythm. Heart Sounds- S1 WNL and S2 WNL. Murmurs & Other Heart  Sounds:Auscultation of the heart reveals - No Murmurs.   Abdomen Palpation/Percussion:Tenderness- Abdomen is non-tender to palpation. Rigidity (guarding)- Abdomen is soft. Auscultation:Auscultation of the abdomen reveals - Bowel sounds normal.   Female Genitourinary Not done, not pertinent to present illness  Musculoskeletal On exam she is alert and oriented in no apparent distress. Her right knee shows no effusion. Her range is about 5 to 125. She is very tender on the lateral jointline and palpation there reproduces a lot of her pain. There is no medial tenderness or instability noted.  RADIOGRAPHS: AP both knees and lateral, she does have some lateral joint space narrowing but she is no where near bone on bone.   Assessment & Plan Primary osteoarthritis of one knee (715.16) Impression: Right Knee  Note: Plan is for a Right Total Knee Replacement by Dr. Wynelle Link.  Plan is to go home.  PCP - Dr. Eliezer Lofts  The patient will not receive TXA (tranexamic acid) due to: Breast Cancer  Signed electronically by Joelene Millin, III PA-C

## 2013-06-06 NOTE — Interval H&P Note (Signed)
History and Physical Interval Note:  06/06/2013 6:54 AM  Darlene Lawrence  has presented today for surgery, with the diagnosis of oa right knee   The various methods of treatment have been discussed with the patient and family. After consideration of risks, benefits and other options for treatment, the patient has consented to  Procedure(s): RIGHT TOTAL KNEE ARTHROPLASTY (Right) as a surgical intervention .  The patient's history has been reviewed, patient examined, no change in status, stable for surgery.  I have reviewed the patient's chart and labs.  Questions were answered to the patient's satisfaction.     [name]

## 2013-06-06 NOTE — Progress Notes (Signed)
UR completed 

## 2013-06-06 NOTE — Anesthesia Postprocedure Evaluation (Signed)
  Anesthesia Post-op Note  Patient: Darlene Lawrence  Procedure(s) Performed: Procedure(s) (LRB): RIGHT TOTAL KNEE ARTHROPLASTY (Right)  Patient Location: PACU  Anesthesia Type: Spinal  Level of Consciousness: awake and alert   Airway and Oxygen Therapy: Patient Spontanous Breathing  Post-op Pain: mild  Post-op Assessment: Post-op Vital signs reviewed, Patient's Cardiovascular Status Stable, Respiratory Function Stable, Patent Airway and No signs of Nausea or vomiting  Last Vitals:  Filed Vitals:   06/06/13 0900  BP: 112/55  Pulse: 58  Temp:   Resp: 11    Post-op Vital Signs: stable   Complications: No apparent anesthesia complications

## 2013-06-06 NOTE — Anesthesia Procedure Notes (Signed)
Spinal  Patient location during procedure: OR Staffing Performed by: anesthesiologist  Preanesthetic Checklist Completed: patient identified, site marked, surgical consent, pre-op evaluation, timeout performed, IV checked, risks and benefits discussed and monitors and equipment checked Spinal Block Patient position: sitting Prep: Betadine Patient monitoring: heart rate, continuous pulse ox and blood pressure Injection technique: single-shot Needle Needle type: Quincke  Needle gauge: 22 G Needle length: 9 cm Additional Notes Expiration date of kit checked and confirmed. Patient tolerated procedure well, without complications.

## 2013-06-06 NOTE — Evaluation (Signed)
Physical Therapy Evaluation Patient Details Name: Darlene Lawrence MRN: [id number] DOB: 01-19-1941 Today's Date: 06/06/2013 Time: 0867-6195 PT Time Calculation (min): 22 min  PT Assessment / Plan / Recommendation History of Present Illness  R TKA  Clinical Impression  **Pt is s/p TKA resulting in the deficits listed below (see PT Problem List). ** Pt will benefit from skilled PT to increase their independence and safety with mobility to allow discharge to the venue listed below. *    PT Assessment  Patient needs continued PT services    Follow Up Recommendations  Home health PT    Does the patient have the potential to tolerate intense rehabilitation      Barriers to Discharge        Equipment Recommendations  None recommended by PT    Recommendations for Other Services     Frequency 7X/week    Precautions / Restrictions     Pertinent Vitals/Pain **3/10 R knee Premedicated, ice applied*      Mobility  Bed Mobility Overal bed mobility: Needs Assistance Bed Mobility: Supine to Sit Supine to sit: Min assist General bed mobility comments: assist to support RLE, VCs for technique Transfers Overall transfer level: Needs assistance Transfers: Sit to/from Stand Sit to Stand: Min assist General transfer comment: VCs hand placement, assist to rise Ambulation/Gait Ambulation/Gait assistance: Min guard Ambulation Distance (Feet): 40 Feet Assistive device: Rolling walker (2 wheeled) Gait Pattern/deviations: Step-to pattern;Decreased weight shift to right;Antalgic Gait velocity: decr 2* pain    Exercises Total Joint Exercises Ankle Circles/Pumps: AROM;Both;10 reps;Supine Quad Sets: AROM;Right;5 reps;Supine Heel Slides: AAROM;Right;10 reps;Supine   PT Diagnosis: Difficulty walking;Acute pain  PT Problem List: Decreased strength;Decreased range of motion;Decreased activity tolerance;Pain;Decreased mobility;Decreased knowledge of use of DME PT Treatment Interventions: DME  instruction;Gait training;Stair training;Functional mobility training;Therapeutic exercise;Therapeutic activities;Patient/family education     PT Goals(Current goals can be found in the care plan section) Acute Rehab PT Goals Patient Stated Goal: to walk in the neighborhood PT Goal Formulation: With patient Time For Goal Achievement: 06/10/13 Potential to Achieve Goals: Good  Visit Information  Last PT Received On: 06/06/13 Assistance Needed: +1 History of Present Illness: R TKA       Prior [place] expects to be discharged to:: Private residence Living Arrangements: Spouse/significant other Available Help at Discharge: Family;Available 24 hours/day Type of Home: House Home Access: Stairs to enter [employer] of Steps: 3 Entrance Stairs-Rails: None Home Layout: Two level;Able to live on main level with bedroom/bathroom Home Equipment: Gilford Rile - 2 wheels;Bedside commode;Tub bench;Cane - single point Prior Function Level of Independence: Independent with assistive device(s) Comments: ambulated with cane for long distances Communication Communication: No difficulties    Cognition  Cognition Arousal/Alertness: Awake/alert Behavior During Therapy: WFL for tasks assessed/performed Overall Cognitive Status: Within Functional Limits for tasks assessed    Extremity/Trunk Assessment Upper Extremity Assessment Upper Extremity Assessment: Overall WFL for tasks assessed Lower Extremity Assessment Lower Extremity Assessment: RLE deficits/detail RLE Deficits / Details: SLR 3/5, knee flexion AAROM 45*, ankle WNL Cervical / Trunk Assessment Cervical / Trunk Assessment: Normal   Balance    End of Session PT - End of Session Equipment Utilized During Treatment: Gait belt Activity Tolerance: Patient tolerated treatment well Patient left: in chair;with call bell/phone within reach Nurse Communication: Mobility status CPM Right Knee CPM Right  Knee: Off  GP     Darlene Lawrence 06/06/2013, 4:51 PM [phone]

## 2013-06-06 NOTE — Anesthesia Preprocedure Evaluation (Signed)
Anesthesia Evaluation  Patient identified by MRN, date of birth, ID band Patient awake    Reviewed: Allergy & Precautions, H&P , NPO status , Patient's Chart, lab work & pertinent test results  Airway Mallampati: II TM Distance: >3 FB Neck ROM: Full    Dental no notable dental hx.    Pulmonary neg pulmonary ROS,  breath sounds clear to auscultation  Pulmonary exam normal       Cardiovascular hypertension, Pt. on medications Rhythm:Regular Rate:Normal     Neuro/Psych negative neurological ROS  negative psych ROS   GI/Hepatic negative GI ROS, Neg liver ROS,   Endo/Other  negative endocrine ROS  Renal/GU negative Renal ROS  negative genitourinary   Musculoskeletal negative musculoskeletal ROS (+)   Abdominal   Peds negative pediatric ROS (+)  Hematology negative hematology ROS (+)   Anesthesia Other Findings   Reproductive/Obstetrics negative OB ROS                           Anesthesia Physical Anesthesia Plan  ASA: II  Anesthesia Plan: Spinal   Post-op Pain Management:    Induction: Intravenous  Airway Management Planned: Simple Face Mask  Additional Equipment:   Intra-op Plan:   Post-operative Plan:   Informed Consent: I have reviewed the patients History and Physical, chart, labs and discussed the procedure including the risks, benefits and alternatives for the proposed anesthesia with the patient or authorized representative who has indicated his/her understanding and acceptance.   Dental advisory given  Plan Discussed with: CRNA and Surgeon  Anesthesia Plan Comments:         Anesthesia Quick Evaluation  

## 2013-06-07 ENCOUNTER — Encounter (HOSPITAL_COMMUNITY): Payer: Self-pay | Admitting: Orthopedic Surgery

## 2013-06-07 DIAGNOSIS — D62 Acute posthemorrhagic anemia: Secondary | ICD-10-CM | POA: Diagnosis not present

## 2013-06-07 LAB — BASIC METABOLIC PANEL
BUN: 24 mg/dL — ABNORMAL HIGH (ref 6–23)
CO2: 23 mEq/L (ref 19–32)
Calcium: 8.2 mg/dL — ABNORMAL LOW (ref 8.4–10.5)
Chloride: 103 mEq/L (ref 96–112)
Creatinine, Ser: 0.88 mg/dL (ref 0.50–1.10)
GFR calc Af Amer: 75 mL/min — ABNORMAL LOW (ref >90)
GFR, EST NON AFRICAN AMERICAN: 65 mL/min — AB (ref >90)
Glucose, Bld: 124 mg/dL — ABNORMAL HIGH (ref 70–99)
POTASSIUM: 4.2 meq/L (ref 3.7–5.3)
SODIUM: 138 meq/L (ref 137–147)

## 2013-06-07 LAB — CBC
HCT: 29.7 % — ABNORMAL LOW (ref 36.0–46.0)
HEMOGLOBIN: 10.1 g/dL — AB (ref 12.0–15.0)
MCH: 34 pg (ref 26.0–34.0)
MCHC: 34 g/dL (ref 30.0–36.0)
MCV: 100 fL (ref 78.0–100.0)
PLATELETS: 150 10*3/uL (ref 150–400)
RBC: 2.97 MIL/uL — ABNORMAL LOW (ref 3.87–5.11)
RDW: 13.4 % (ref 11.5–15.5)
WBC: 7.9 10*3/uL (ref 4.0–10.5)

## 2013-06-07 NOTE — Progress Notes (Signed)
Physical Therapy Treatment Patient Details Name: Darlene Lawrence MRN: [id number] DOB: 05-06-1941 Today's Date: 06/07/2013 Time: 1200-1218 PT Time Calculation (min): 18 min  PT Assessment / Plan / Recommendation  History of Present Illness R TKA   PT Comments   *Pt continues to progress well with mobility, she walked 140' with RW and is doing well with TKA exercises. She is independent with SLR. Will do stair training tomorrow morning. **  Follow Up Recommendations  Home health PT     Does the patient have the potential to tolerate intense rehabilitation     Barriers to Discharge        Equipment Recommendations  None recommended by PT    Recommendations for Other Services    Frequency 7X/week   Progress towards PT Goals Progress towards PT goals: Progressing toward goals  Plan Current plan remains appropriate    Precautions / Restrictions Precautions Precautions: Knee Required Braces or Orthoses: Knee Immobilizer - Right Knee Immobilizer - Right:  (Pt able to do SLR, DC'd KI) Restrictions Weight Bearing Restrictions: No   Pertinent Vitals/Pain *4/10 R knee with walking Premedicated, ice applied**    Mobility  Bed Mobility Overal bed mobility: Modified Independent Bed Mobility: Sit to Supine Supine to sit: Modified independent (Device/Increase time) General bed mobility comments: HOB elevated Transfers Overall transfer level: Needs assistance Equipment used: Rolling walker (2 wheeled) Transfers: Sit to/from Stand Sit to Stand: Modified independent (Device/Increase time) General transfer comment: with UE assist Ambulation/Gait Ambulation/Gait assistance: Supervision Ambulation Distance (Feet): 140 Feet Assistive device: Rolling walker (2 wheeled) Gait Pattern/deviations: Step-to pattern Gait velocity: decr 2* pain General Gait Details: steady, no LOB, good sequencing    Exercises Total Joint Exercises Ankle Circles/Pumps: AROM;Both;10 reps;Supine Quad Sets:  AROM;Right;5 reps;Supine Towel Squeeze: AROM;Both;10 reps Short Arc QuadSinclair Lawrence;Right;10 reps;Supine Heel Slides: AAROM;Right;10 reps;Supine Hip ABduction/ADduction: AROM;Right;10 reps;Supine Straight Leg Raises: AAROM;Right;10 reps;Supine Goniometric ROM: flexion R knee 55* AAROM   PT Diagnosis:    PT Problem List:   PT Treatment Interventions:     PT Goals (current goals can now be found in the care plan section) Acute Rehab PT Goals Patient Stated Goal: to walk in the neighborhood PT Goal Formulation: With patient Time For Goal Achievement: 06/10/13 Potential to Achieve Goals: Good  Visit Information  Last PT Received On: 06/07/13 Assistance Needed: +1 History of Present Illness: R TKA    Subjective Data  Patient Stated Goal: to walk in the neighborhood   Cognition  Cognition Arousal/Alertness: Awake/alert Behavior During Therapy: WFL for tasks assessed/performed Overall Cognitive Status: Within Functional Limits for tasks assessed    Balance     End of Session PT - End of Session Equipment Utilized During Treatment: Gait belt Activity Tolerance: Patient tolerated treatment well Patient left: with call bell/phone within reach;in bed Nurse Communication: Mobility status   GP     Darlene Lawrence 06/07/2013, 12:29 PM [phone]

## 2013-06-07 NOTE — Progress Notes (Signed)
Physical Therapy Treatment Patient Details Name: Darlene Lawrence MRN: [id number] DOB: 28-Sep-1940 Today's Date: 06/07/2013 Time: 0998-3382 PT Time Calculation (min): 36 min  PT Assessment / Plan / Recommendation  History of Present Illness R TKA   PT Comments   **Pt progressing well with mobility, she walked 59' with RW. Expect will be ready to DC home tomorrow. *  Follow Up Recommendations  Home health PT     Does the patient have the potential to tolerate intense rehabilitation     Barriers to Discharge        Equipment Recommendations  None recommended by PT    Recommendations for Other Services    Frequency 7X/week   Progress towards PT Goals Progress towards PT goals: Progressing toward goals  Plan Current plan remains appropriate    Precautions / Restrictions Precautions Precautions: None Restrictions Weight Bearing Restrictions: No   Pertinent Vitals/Pain **5/10 R knee with walking, pain meds requested Ice applied*    Mobility  Bed Mobility Overal bed mobility: Modified Independent Bed Mobility: Supine to Sit Supine to sit: Modified independent (Device/Increase time) General bed mobility comments: HOB elevated Transfers Overall transfer level: Needs assistance Equipment used: Rolling walker (2 wheeled) Sit to Stand: Supervision General transfer comment: VCs hand placement Ambulation/Gait Ambulation Distance (Feet): 75 Feet Assistive device: Rolling walker (2 wheeled) Gait Pattern/deviations: Step-to pattern;Antalgic Gait velocity: decr 2* pain General Gait Details: steady, no LOB, good sequencing    Exercises Total Joint Exercises Ankle Circles/Pumps: AROM;Both;10 reps;Supine Quad Sets: AROM;Right;5 reps;Supine Short Arc Quad: AAROM;Right;10 reps;Supine Heel Slides: AAROM;Right;10 reps;Supine Hip ABduction/ADduction: AROM;Right;10 reps;Supine Straight Leg Raises: AAROM;Right;10 reps;Supine Goniometric ROM: flexion R knee 55* AAROM   PT Diagnosis:     PT Problem List:   PT Treatment Interventions:     PT Goals (current goals can now be found in the care plan section) Acute Rehab PT Goals Patient Stated Goal: to walk in the neighborhood PT Goal Formulation: With patient Time For Goal Achievement: 06/10/13 Potential to Achieve Goals: Good  Visit Information  Last PT Received On: 06/07/13 Assistance Needed: +1 History of Present Illness: R TKA    Subjective Data  Patient Stated Goal: to walk in the neighborhood   Cognition  Cognition Arousal/Alertness: Awake/alert Behavior During Therapy: WFL for tasks assessed/performed Overall Cognitive Status: Within Functional Limits for tasks assessed    Balance     End of Session PT - End of Session Equipment Utilized During Treatment: Gait belt Activity Tolerance: Patient tolerated treatment well Patient left: in chair;with call bell/phone within reach Nurse Communication: Mobility status   GP     Darlene Lawrence 06/07/2013, 9:16 AM [phone]

## 2013-06-07 NOTE — Evaluation (Signed)
Occupational Therapy Evaluation Patient Details Name: Darlene Lawrence MRN: [id number] DOB: 02/12/1941 Today's Date: 06/07/2013 Time: 7035-0093 OT Time Calculation (min): 12 min  OT Assessment / Plan / Recommendation History of present illness R TKA   Clinical Impression   Pt was admitted for the above surgery and she has a h/o THA.  Pt was seen for education to review DME and AE.  No further OT is needed at this time.      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 She wanted to review tub bench transfer and AE.  She will have assistance at home.  Demonstrated transfer with tub transfer bench and re-educated on sock aid and KI.  Pt is starting to be able to pick RLE, and demonstrated L foot hooking under R ankle if more clearance is needed.  Pt verbalizes all and did not feel she needed to practice. Pt used commode with PT and feels comfortable with this transfer.   OT Diagnosis:    OT Problem List:   OT Treatment Interventions:     OT Goals(Current goals can be found in the care plan section) Acute Rehab OT Goals Patient Stated Goal: to walk in the neighborhood  Visit Information  Last OT Received On: 06/07/13 Assistance Needed: +1 History of Present Illness: R TKA       Prior [place] expects to be discharged to:: Private residence Living Arrangements: Spouse/significant other Available Help at Discharge: Family;Available 24 hours/day Home  Equipment: Walker - 2 wheels;Bedside commode;Tub bench;Cane - single point Prior Function Level of Independence: Independent with assistive device(s) Communication Communication: No difficulties         Vision/Perception     Cognition  Cognition Arousal/Alertness: Awake/alert Behavior During Therapy: WFL for tasks assessed/performed Overall Cognitive Status: Within Functional Limits for tasks assessed    Extremity/Trunk Assessment       Mobility      Exercise    Balance     End of Session OT - End of Session Patient left: in chair;with call bell/phone within reach  [place] 06/07/2013, 11:13 AM Lesle Chris, OTR/L [phone] 06/07/2013

## 2013-06-07 NOTE — Discharge Instructions (Addendum)
° °Dr. Frank Aluisio °Total Joint Specialist °[facility] °[address] °[place], [place] [address] °[phone] ° °TOTAL KNEE REPLACEMENT POSTOPERATIVE DIRECTIONS ° ° ° °Knee Rehabilitation, Guidelines Following Surgery  °Results after knee surgery are often greatly improved when you follow the exercise, range of motion and muscle strengthening exercises prescribed by your doctor. Safety measures are also important to protect the knee from further injury. Any time any of these exercises cause you to have increased pain or swelling in your knee joint, decrease the amount until you are comfortable again and slowly increase them. If you have problems or questions, call your caregiver or physical therapist for advice.  ° °HOME CARE INSTRUCTIONS  °Remove items at home which could result in a fall. This includes throw rugs or furniture in walking pathways.  °Continue medications as instructed at time of discharge. °You may have some home medications which will be placed on hold until you complete the course of blood thinner medication.  °You may start showering once you are discharged home but do not submerge the incision under water. Just pat the incision dry and apply a dry gauze dressing on daily. °Walk with walker as instructed.  °You may resume a sexual relationship in one month or when given the OK by  your doctor.  °· Use walker as long as suggested by your caregivers. °· Avoid periods of inactivity such as sitting longer than an hour when not asleep. This helps prevent blood clots.  °You may put full weight on your legs and walk as much as is comfortable.  °You may return to work once you are cleared by your doctor.  °Do not drive a car for 6 weeks or until released by you surgeon.  °· Do not drive while taking narcotics.  °Wear the elastic stockings for three weeks following surgery during the day but you may remove then at night. °Make sure you keep all of your appointments after your  operation with all of your doctors and caregivers. You should call the office at the above phone number and make an appointment for approximately two weeks after the date of your surgery. °Change the dressing daily and reapply a dry dressing each time. °Please pick up a stool softener and laxative for home use as long as you are requiring pain medications. °· Continue to use ice on the knee for pain and swelling from surgery. You may notice swelling that will progress down to the foot and ankle.  This is normal after surgery.  Elevate the leg when you are not up walking on it.   °It is important for you to complete the blood thinner medication as prescribed by your doctor. °· Continue to use the breathing machine which will help keep your temperature down.  It is common for your temperature to cycle up and down following surgery, especially at night when you are not up moving around and exerting yourself.  The breathing machine keeps your lungs expanded and your temperature down. ° °RANGE OF MOTION AND STRENGTHENING EXERCISES  °Rehabilitation of the knee is important following a knee injury or an operation. After just a few days of immobilization, the muscles of the thigh which control the knee become weakened and shrink (atrophy). Knee exercises are designed to build up the tone and strength of the thigh muscles and to improve knee motion. Often times heat used for twenty to thirty minutes before working out will loosen up your tissues and help with improving the   range of motion but do not use heat for the first two weeks following surgery. These exercises can be done on a training (exercise) mat, on the floor, on a table or on a bed. Use what ever works the best and is most comfortable for you Knee exercises include:  Leg Lifts - While your knee is still immobilized in a splint or cast, you can do straight leg raises. Lift the leg to 60 degrees, hold for 3 sec, and slowly lower the leg. Repeat 10-20 times 2-3  times daily. Perform this exercise against resistance later as your knee gets better.  Quad and Hamstring Sets - Tighten up the muscle on the front of the thigh (Quad) and hold for 5-10 sec. Repeat this 10-20 times hourly. Hamstring sets are done by pushing the foot backward against an object and holding for 5-10 sec. Repeat as with quad sets.  A rehabilitation program following serious knee injuries can speed recovery and prevent re-injury in the future due to weakened muscles. Contact your doctor or a physical therapist for more information on knee rehabilitation.   SKILLED REHAB INSTRUCTIONS: If the patient is transferred to a skilled rehab facility following release from the hospital, a list of the current medications will be sent to the facility for the patient to continue.  When discharged from the skilled rehab facility, please have the facility set up the patient's [place] prior to being released. Also, the skilled facility will be responsible for providing the patient with their medications at time of release from the facility to include their pain medication, the muscle relaxants, and their blood thinner medication. If the patient is still at the rehab facility at time of the two week follow up appointment, the skilled rehab facility will also need to assist the patient in arranging follow up appointment in our office and any transportation needs.  MAKE SURE YOU:  Understand these instructions.  Will watch your condition.  Will get help right away if you are not doing well or get worse.    Pick up stool softner and laxative for home. Do not submerge incision under water. May shower. Continue to use ice for pain and swelling from surgery.  Take Xarelto for two and a half more weeks, then discontinue Xarelto. Once the patient has completed the Xarelto, they may resume the 81 mg Aspirin.      Information on my medicine - XARELTO (Rivaroxaban)  This medication  education was reviewed with me or my healthcare representative as part of my discharge preparation.  The pharmacist that spoke with me during my hospital stay was:  Absher, Julieta Bellini, RPH  Why was Xarelto prescribed for you? Xarelto was prescribed for you to reduce the risk of blood clots forming after orthopedic surgery.  What do you need to know about xarelto ? Take your Xarelto ONCE DAILY at the same time every day. If you have difficulty swallowing the tablet whole, you may crush it and mix in applesauce just prior to taking your dose.  Take Xarelto exactly as prescribed by your doctor and DO NOT stop taking Xarelto without talking to the doctor who prescribed the medication.  Stopping without other VTE prevention medication to take the place of Xarelto may increase your risk of developing a new clot.  After discharge, you should have regular check-up appointments with your healthcare provider that is prescribing your Xarelto.  I.  What do you do if you miss a dose? If you miss  a dose, take it as soon as you remember on the same day then continue your regularly scheduled once daily regimen the next day. Do not take two doses of Xarelto on the same day.   Important Safety Information A possible side effect of Xarelto is bleeding. You should call your healthcare provider right away if you experience any of the following:   Bleeding from an injury or your nose that does not stop.   Unusual colored urine (red or dark brown) or unusual colored stools (red or black).   Unusual bruising for unknown reasons.   A serious fall or if you hit your head (even if there is no bleeding).  Some medicines may interact with Xarelto and might increase your risk of bleeding while on Xarelto. To help avoid this, consult your healthcare provider or pharmacist prior to using any new prescription or non-prescription medications, including herbals, vitamins, non-steroidal anti-inflammatory drugs  (NSAIDs) and supplements.  This website has more information on Xarelto: [email or web address]

## 2013-06-07 NOTE — Progress Notes (Signed)
   Subjective: 1 Day Post-Op Procedure(s) (LRB): RIGHT TOTAL KNEE ARTHROPLASTY (Right) Patient reports pain as mild.   Patient seen in rounds with Dr. Wynelle Link. Patient is well, but has had some minor complaints of pain in the knee, requiring pain medications We will start therapy today.  Plan is to go Home after hospital stay.  Objective: Vital signs in last 24 hours: Temp:  [96.5 F (35.8 C)-99.4 F (37.4 C)] 98.5 F (36.9 C) (01/27 0513) Pulse Rate:  [51-78] 64 (01/27 0513) Resp:  [8-18] 17 (01/27 0513) BP: (99-145)/(48-82) 116/72 mmHg (01/27 0513) SpO2:  [97 %-100 %] 100 % (01/27 0513) Weight:  [93.781 kg (206 lb 12 oz)] 93.781 kg (206 lb 12 oz) (01/26 1045)  Intake/Output from previous day:  Intake/Output Summary (Last 24 hours) at 06/07/13 0800 Last data filed at 06/07/13 0516  Gross per 24 hour  Intake 2257.5 ml  Output   2395 ml  Net -137.5 ml    Intake/Output this shift:    Labs:  Recent Labs  06/07/13 0519  HGB 10.1*    Recent Labs  06/07/13 0519  WBC 7.9  RBC 2.97*  HCT 29.7*  PLT 150    Recent Labs  06/07/13 0519  NA 138  K 4.2  CL 103  CO2 23  BUN 24*  CREATININE 0.88  GLUCOSE 124*  CALCIUM 8.2*   No results found for this basename: LABPT, INR,  in the last 72 hours  EXAM General - Patient is Alert, Appropriate and Oriented Extremity - Neurovascular intact Sensation intact distally Dorsiflexion/Plantar flexion intact Dressing - dressing C/D/I Motor Function - intact, moving foot and toes well on exam.  Hemovac pulled without difficulty.  Past Medical History  Diagnosis Date  . Hyperlipidemia   . Hypertension   . breast cancer 2009    right  . Abnormal Pap smear   . Arthritis   . High cholesterol   . Meniscus tear     right  . DDD (degenerative disc disease), lumbar   . RBBB     Assessment/Plan: 1 Day Post-Op Procedure(s) (LRB): RIGHT TOTAL KNEE ARTHROPLASTY (Right) Principal Problem:   OA (osteoarthritis) of  knee Active Problems:   Postoperative anemia due to acute blood loss  Estimated body mass index is 34.4 kg/(m^2) as calculated from the following:   Height as of this encounter: 5\' 5"  (1.651 m).   Weight as of this encounter: 93.781 kg (206 lb 12 oz). Advance diet Up with therapy Plan for discharge tomorrow Discharge home with home health  DVT Prophylaxis - Xarelto Weight-Bearing as tolerated to right leg D/C O2 and Pulse OX and try on Room Air  Vaishnav Demartin 06/07/2013, 8:00 AM

## 2013-06-08 LAB — BASIC METABOLIC PANEL
BUN: 23 mg/dL (ref 6–23)
CHLORIDE: 101 meq/L (ref 96–112)
CO2: 23 mEq/L (ref 19–32)
CREATININE: 0.85 mg/dL (ref 0.50–1.10)
Calcium: 8.2 mg/dL — ABNORMAL LOW (ref 8.4–10.5)
GFR calc non Af Amer: 68 mL/min — ABNORMAL LOW (ref >90)
GFR, EST AFRICAN AMERICAN: 79 mL/min — AB (ref >90)
Glucose, Bld: 139 mg/dL — ABNORMAL HIGH (ref 70–99)
Potassium: 4.1 mEq/L (ref 3.7–5.3)
Sodium: 136 mEq/L — ABNORMAL LOW (ref 137–147)

## 2013-06-08 LAB — CBC
HCT: 29.3 % — ABNORMAL LOW (ref 36.0–46.0)
Hemoglobin: 9.8 g/dL — ABNORMAL LOW (ref 12.0–15.0)
MCH: 33.9 pg (ref 26.0–34.0)
MCHC: 33.4 g/dL (ref 30.0–36.0)
MCV: 101.4 fL — ABNORMAL HIGH (ref 78.0–100.0)
PLATELETS: 139 10*3/uL — AB (ref 150–400)
RBC: 2.89 MIL/uL — AB (ref 3.87–5.11)
RDW: 13.5 % (ref 11.5–15.5)
WBC: 9.2 10*3/uL (ref 4.0–10.5)

## 2013-06-08 MED ORDER — METHOCARBAMOL 500 MG PO TABS: 500.0000 mg | ORAL_TABLET | Freq: Four times a day (QID) | ORAL | Status: DC | PRN

## 2013-06-08 MED ORDER — RIVAROXABAN 10 MG PO TABS: 10.0000 mg | ORAL_TABLET | Freq: Every day | ORAL | Status: DC

## 2013-06-08 MED ORDER — TRAMADOL HCL 50 MG PO TABS: 50.0000 mg | ORAL_TABLET | Freq: Four times a day (QID) | ORAL | Status: DC | PRN

## 2013-06-08 MED ORDER — OXYCODONE HCL 5 MG PO TABS: 5.0000 mg | ORAL_TABLET | ORAL | Status: DC | PRN

## 2013-06-08 NOTE — Progress Notes (Signed)
   Subjective: 2 Days Post-Op Procedure(s) (LRB): RIGHT TOTAL KNEE ARTHROPLASTY (Right) Patient reports pain as mild.   Patient seen in rounds with Dr. Wynelle Link. Patient is well, and has had no acute complaints or problems Patient is ready to go home  Objective: Vital signs in last 24 hours: Temp:  [98 F (36.7 C)-99 F (37.2 C)] 98.5 F (36.9 C) (01/28 0529) Pulse Rate:  [56-67] 67 (01/28 0529) Resp:  [16] 16 (01/28 0529) BP: (109-147)/(69-79) 128/71 mmHg (01/28 0529) SpO2:  [96 %-97 %] 97 % (01/28 0529)  Intake/Output from previous day:  Intake/Output Summary (Last 24 hours) at 06/08/13 0953 Last data filed at 06/08/13 0900  Gross per 24 hour  Intake 1112.25 ml  Output   1270 ml  Net -157.75 ml    Intake/Output this shift: Total I/O In: 240 [P.O.:240] Out: -   Labs:  Recent Labs  06/07/13 0519 06/08/13 0428  HGB 10.1* 9.8*    Recent Labs  06/07/13 0519 06/08/13 0428  WBC 7.9 9.2  RBC 2.97* 2.89*  HCT 29.7* 29.3*  PLT 150 139*    Recent Labs  06/07/13 0519 06/08/13 0428  NA 138 136*  K 4.2 4.1  CL 103 101  CO2 23 23  BUN 24* 23  CREATININE 0.88 0.85  GLUCOSE 124* 139*  CALCIUM 8.2* 8.2*   No results found for this basename: LABPT, INR,  in the last 72 hours  EXAM: General - Patient is Alert and Appropriate Extremity - Neurovascular intact Sensation intact distally Incision - clean, dry, no drainage Motor Function - intact, moving foot and toes well on exam.   Assessment/Plan: 2 Days Post-Op Procedure(s) (LRB): RIGHT TOTAL KNEE ARTHROPLASTY (Right) Procedure(s) (LRB): RIGHT TOTAL KNEE ARTHROPLASTY (Right) Past Medical History  Diagnosis Date  . Hyperlipidemia   . Hypertension   . breast cancer 2009    right  . Abnormal Pap smear   . Arthritis   . High cholesterol   . Meniscus tear     right  . DDD (degenerative disc disease), lumbar   . RBBB    Principal Problem:   OA (osteoarthritis) of knee Active Problems:  Postoperative anemia due to acute blood loss  Estimated body mass index is 34.4 kg/(m^2) as calculated from the following:   Height as of this encounter: 5\' 5"  (1.651 m).   Weight as of this encounter: 93.781 kg (206 lb 12 oz). Discharge home with home health Diet - Cardiac diet Follow up - in 2 weeks Activity - WBAT Disposition - Home Condition Upon Discharge - Good D/C Meds - See DC Summary DVT Prophylaxis - Xarelto  PERKINS, ALEXZANDREW 06/08/2013, 9:53 AM

## 2013-06-08 NOTE — Discharge Summary (Signed)
Physician Discharge Summary   Patient ID: Darlene Lawrence MRN: [id number] DOB/AGE: Aug 30, 1940 73 y.o.  Admit date: 06/06/2013 Discharge date: 06/08/2013  Primary Diagnosis:  Osteoarthritis Right knee(s)  Admission Diagnoses:  Past Medical History  Diagnosis Date  . Hyperlipidemia   . Hypertension   . breast cancer 2009    right  . Abnormal Pap smear   . Arthritis   . High cholesterol   . Meniscus tear     right  . DDD (degenerative disc disease), lumbar   . RBBB    Discharge Diagnoses:   Principal Problem:   OA (osteoarthritis) of knee Active Problems:   Postoperative anemia due to acute blood loss  Estimated body mass index is 34.4 kg/(m^2) as calculated from the following:   Height as of this encounter: _0  (1.651 m).   Weight as of this encounter: 93.781 kg (206 lb 12 oz).  Procedure:  Procedure(s) (LRB): RIGHT TOTAL KNEE ARTHROPLASTY (Right)   Consults: None  HPI: Darlene Lawrence is a 73 y.o. year old female with end stage OA of her right knee with progressively worsening pain and dysfunction. She has constant pain, with activity and at rest and significant functional deficits with difficulties even with ADLs. She has had extensive non-op management including analgesics, injections of cortisone and viscosupplements, and home exercise program, but remains in significant pain with significant dysfunction.Radiographs show bone on bone arthritis lateral and patellofemoal. She presents now for right Total Knee Arthroplasty.   Laboratory Data: Admission on 06/06/2013, Discharged on 06/08/2013  Component Date Value Range Status  . WBC 06/07/2013 7.9  4.0 - 10.5 K/uL Final  . RBC 06/07/2013 2.97* 3.87 - 5.11 MIL/uL Final  . Hemoglobin 06/07/2013 10.1* 12.0 - 15.0 g/dL Final  . HCT 06/07/2013 29.7* 36.0 - 46.0 % Final  . MCV 06/07/2013 100.0  78.0 - 100.0 fL Final  . MCH 06/07/2013 34.0  26.0 - 34.0 pg Final  . MCHC 06/07/2013 34.0  30.0 - 36.0 g/dL Final  . RDW  06/07/2013 13.4  11.5 - 15.5 % Final  . Platelets 06/07/2013 150  150 - 400 K/uL Final  . Sodium 06/07/2013 138  137 - 147 mEq/L Final  . Potassium 06/07/2013 4.2  3.7 - 5.3 mEq/L Final  . Chloride 06/07/2013 103  96 - 112 mEq/L Final  . CO2 06/07/2013 23  19 - 32 mEq/L Final  . Glucose, Bld 06/07/2013 124* 70 - 99 mg/dL Final  . BUN 06/07/2013 24* 6 - 23 mg/dL Final  . Creatinine, Ser 06/07/2013 0.88  0.50 - 1.10 mg/dL Final  . Calcium 06/07/2013 8.2* 8.4 - 10.5 mg/dL Final  . GFR calc non Af Amer 06/07/2013 65* >90 mL/min Final  . GFR calc Af Amer 06/07/2013 75* >90 mL/min Final   Comment: (NOTE)                          The eGFR has been calculated using the CKD EPI equation.                          This calculation has not been validated in all clinical situations.                          eGFR's persistently <90 mL/min signify possible Chronic Kidney  Disease.  . WBC 06/08/2013 9.2  4.0 - 10.5 K/uL Final  . RBC 06/08/2013 2.89* 3.87 - 5.11 MIL/uL Final  . Hemoglobin 06/08/2013 9.8* 12.0 - 15.0 g/dL Final  . HCT 06/08/2013 29.3* 36.0 - 46.0 % Final  . MCV 06/08/2013 101.4* 78.0 - 100.0 fL Final  . MCH 06/08/2013 33.9  26.0 - 34.0 pg Final  . MCHC 06/08/2013 33.4  30.0 - 36.0 g/dL Final  . RDW 06/08/2013 13.5  11.5 - 15.5 % Final  . Platelets 06/08/2013 139* 150 - 400 K/uL Final  . Sodium 06/08/2013 136* 137 - 147 mEq/L Final  . Potassium 06/08/2013 4.1  3.7 - 5.3 mEq/L Final  . Chloride 06/08/2013 101  96 - 112 mEq/L Final  . CO2 06/08/2013 23  19 - 32 mEq/L Final  . Glucose, Bld 06/08/2013 139* 70 - 99 mg/dL Final  . BUN 06/08/2013 23  6 - 23 mg/dL Final  . Creatinine, Ser 06/08/2013 0.85  0.50 - 1.10 mg/dL Final  . Calcium 06/08/2013 8.2* 8.4 - 10.5 mg/dL Final  . GFR calc non Af Amer 06/08/2013 68* >90 mL/min Final  . GFR calc Af Amer 06/08/2013 79* >90 mL/min Final   Comment: (NOTE)                          The eGFR has been calculated using the  CKD EPI equation.                          This calculation has not been validated in all clinical situations.                          eGFR's persistently <90 mL/min signify possible Chronic Kidney                          Disease.  Hospital Outpatient Visit on 05/30/2013  Component Date Value Range Status  . MRSA, PCR 05/30/2013 NEGATIVE  NEGATIVE Final  . Staphylococcus aureus 05/30/2013 NEGATIVE  NEGATIVE Final   Comment:                                 The Xpert SA Assay (FDA                          approved for NASAL specimens                          in patients over 58 years of age),                          is one component of                          a comprehensive surveillance                          program.  Test performance has                          been validated by [facility] for patients greater                          than or equal to 78 year old.                          It is not intended                          to diagnose infection nor to                          guide or monitor treatment.  Marland Kitchen aPTT 05/30/2013 29  24 - 37 seconds Final  . WBC 05/30/2013 4.6  4.0 - 10.5 K/uL Final  . RBC 05/30/2013 3.86* 3.87 - 5.11 MIL/uL Final  . Hemoglobin 05/30/2013 12.9  12.0 - 15.0 g/dL Final  . HCT 05/30/2013 38.5  36.0 - 46.0 % Final  . MCV 05/30/2013 99.7  78.0 - 100.0 fL Final  . MCH 05/30/2013 33.4  26.0 - 34.0 pg Final  . MCHC 05/30/2013 33.5  30.0 - 36.0 g/dL Final  . RDW 05/30/2013 13.0  11.5 - 15.5 % Final  . Platelets 05/30/2013 171  150 - 400 K/uL Final  . Sodium 05/30/2013 138  137 - 147 mEq/L Final  . Potassium 05/30/2013 4.4  3.7 - 5.3 mEq/L Final  . Chloride 05/30/2013 101  96 - 112 mEq/L Final  . CO2 05/30/2013 26  19 - 32 mEq/L Final  . Glucose, Bld 05/30/2013 94  70 - 99 mg/dL Final  . BUN 05/30/2013 23  6 - 23 mg/dL Final  . Creatinine, Ser 05/30/2013 0.87  0.50 - 1.10 mg/dL Final  . Calcium 05/30/2013 9.1  8.4 - 10.5  mg/dL Final  . Total Protein 05/30/2013 7.5  6.0 - 8.3 g/dL Final  . Albumin 05/30/2013 3.5  3.5 - 5.2 g/dL Final  . AST 05/30/2013 20  0 - 37 U/L Final  . ALT 05/30/2013 24  0 - 35 U/L Final  . Alkaline Phosphatase 05/30/2013 64  39 - 117 U/L Final  . Total Bilirubin 05/30/2013 0.3  0.3 - 1.2 mg/dL Final  . GFR calc non Af Amer 05/30/2013 66* >90 mL/min Final  . GFR calc Af Amer 05/30/2013 76* >90 mL/min Final   Comment: (NOTE)                          The eGFR has been calculated using the CKD EPI equation.                          This calculation has not been validated in all clinical situations.                          eGFR's persistently <90 mL/min signify possible Chronic Kidney                          Disease.  Marland Kitchen Prothrombin Time 05/30/2013 13.6  11.6 - 15.2 seconds Final  . INR 05/30/2013 1.06  0.00 - 1.49 Final  . ABO/RH(D) 05/30/2013 A POS   Final  . Antibody Screen 05/30/2013 NEG   Final  . Sample Expiration 05/30/2013 06/09/2013   Final  .  Color, Urine 05/30/2013 YELLOW  YELLOW Final  . APPearance 05/30/2013 CLEAR  CLEAR Final  . Specific Gravity, Urine 05/30/2013 1.009  1.005 - 1.030 Final  . pH 05/30/2013 5.0  5.0 - 8.0 Final  . Glucose, UA 05/30/2013 NEGATIVE  NEGATIVE mg/dL Final  . Hgb urine dipstick 05/30/2013 NEGATIVE  NEGATIVE Final  . Bilirubin Urine 05/30/2013 NEGATIVE  NEGATIVE Final  . Ketones, ur 05/30/2013 NEGATIVE  NEGATIVE mg/dL Final  . Protein, ur 05/30/2013 NEGATIVE  NEGATIVE mg/dL Final  . Urobilinogen, UA 05/30/2013 0.2  0.0 - 1.0 mg/dL Final  . Nitrite 05/30/2013 NEGATIVE  NEGATIVE Final  . Leukocytes, UA 05/30/2013 TRACE* NEGATIVE Final  . Squamous Epithelial / LPF 05/30/2013 MANY* RARE Final  . WBC, UA 05/30/2013 0-2  <3 WBC/hpf Final  . RBC / HPF 05/30/2013 0-2  <3 RBC/hpf Final  . Bacteria, UA 05/30/2013 FEW* RARE Final  Lab on 05/13/2013  Component Date Value Range Status  . Vitamin B-12 05/13/2013 992* 211 - 911 pg/mL Final  . WBC  05/13/2013 5.6  4.5 - 10.5 K/uL Final  . RBC 05/13/2013 3.79* 3.87 - 5.11 Mil/uL Final  . Hemoglobin 05/13/2013 12.8  12.0 - 15.0 g/dL Final  . HCT 05/13/2013 38.1  36.0 - 46.0 % Final  . MCV 05/13/2013 100.6* 78.0 - 100.0 fl Final  . MCHC 05/13/2013 33.7  30.0 - 36.0 g/dL Final  . RDW 05/13/2013 13.1  11.5 - 14.6 % Final  . Platelets 05/13/2013 157.0  150.0 - 400.0 K/uL Final  . Neutrophils Relative % 05/13/2013 43.9  43.0 - 77.0 % Final  . Lymphocytes Relative 05/13/2013 41.6  12.0 - 46.0 % Final  . Monocytes Relative 05/13/2013 8.1  3.0 - 12.0 % Final  . Eosinophils Relative 05/13/2013 6.1* 0.0 - 5.0 % Final  . Basophils Relative 05/13/2013 0.3  0.0 - 3.0 % Final  . Neutro Abs 05/13/2013 2.4  1.4 - 7.7 K/uL Final  . Lymphs Abs 05/13/2013 2.3  0.7 - 4.0 K/uL Final  . Monocytes Absolute 05/13/2013 0.5  0.1 - 1.0 K/uL Final  . Eosinophils Absolute 05/13/2013 0.3  0.0 - 0.7 K/uL Final  . Basophils Absolute 05/13/2013 0.0  0.0 - 0.1 K/uL Final  . Cholesterol 05/13/2013 162  0 - 200 mg/dL Final   ATP III Classification       Desirable:  < 200 mg/dL               Borderline High:  200 - 239 mg/dL          High:  > = 240 mg/dL  . Triglycerides 05/13/2013 126.0  0.0 - 149.0 mg/dL Final   Normal:  <150 mg/dLBorderline High:  150 - 199 mg/dL  . HDL 05/13/2013 52.50  >39.00 mg/dL Final  . VLDL 05/13/2013 25.2  0.0 - 40.0 mg/dL Final  . LDL Cholesterol 05/13/2013 84  0 - 99 mg/dL Final  . Total CHOL/HDL Ratio 05/13/2013 3   Final                  Men          Women1/2 Average Risk     3.4          3.3Average Risk          5.0          4.42X Average Risk          9.6          7.13X Average Risk  15.0          11.0                      . Sodium 05/13/2013 140  135 - 145 mEq/L Final  . Potassium 05/13/2013 3.9  3.5 - 5.1 mEq/L Final  . Chloride 05/13/2013 104  96 - 112 mEq/L Final  . CO2 05/13/2013 29  19 - 32 mEq/L Final  . Glucose, Bld 05/13/2013 97  70 - 99 mg/dL Final  . BUN 05/13/2013  20  6 - 23 mg/dL Final  . Creatinine, Ser 05/13/2013 0.9  0.4 - 1.2 mg/dL Final  . Total Bilirubin 05/13/2013 0.6  0.3 - 1.2 mg/dL Final  . Alkaline Phosphatase 05/13/2013 52  39 - 117 U/L Final  . AST 05/13/2013 25  0 - 37 U/L Final  . ALT 05/13/2013 28  0 - 35 U/L Final  . Total Protein 05/13/2013 7.2  6.0 - 8.3 g/dL Final  . Albumin 05/13/2013 3.7  3.5 - 5.2 g/dL Final  . Calcium 05/13/2013 9.2  8.4 - 10.5 mg/dL Final  . GFR 05/13/2013 64.03  >60.00 mL/min Final     X-Rays:Dg Chest 2 View  05/30/2013   CLINICAL DATA:  Preop right knee replacement  EXAM: CHEST  2 VIEW  COMPARISON:  04/17/2010.  FINDINGS: The heart size and mediastinal contours are within normal limits. Both lungs are clear. The visualized skeletal structures are unremarkable.  IMPRESSION: No active cardiopulmonary disease.   Electronically Signed   By: Kerby Moors M.D.   On: 05/30/2013 11:28    EKG: Orders placed during the hospital encounter of 06/06/13  . EKG     Hospital Course: Darlene Lawrence is a 73 y.o. who was admitted to [facility]. They were brought to the operating room on 06/06/2013 and underwent Procedure(s): RIGHT TOTAL KNEE ARTHROPLASTY.  Patient tolerated the procedure well and was later transferred to the recovery room and then to the orthopaedic floor for postoperative care.  They were given PO and IV analgesics for pain control following their surgery.  They were given 24 hours of postoperative antibiotics of  Anti-infectives   Start     Dose/Rate Route Frequency Ordered Stop   06/06/13 1900  vancomycin (VANCOCIN) IVPB 1000 mg/200 mL premix     1,000 mg 200 mL/hr over 60 Minutes Intravenous Every 12 hours 06/06/13 1050 06/06/13 1912   06/06/13 0537  vancomycin (VANCOCIN) IVPB 1000 mg/200 mL premix     1,000 mg 200 mL/hr over 60 Minutes Intravenous On call to O.R. 06/06/13 4098 06/06/13 0710     and started on DVT prophylaxis in the form of Xarelto.   PT and OT were ordered for total  joint protocol.  Discharge planning consulted to help with postop disposition and equipment needs.  Patient had a decent night on the evening of surgery.  They started to get up OOB with therapy on day one. Hemovac drain was pulled without difficulty.  Continued to work with therapy into day two.  Dressing was changed on day two and the incision was healing well.  Patient was seen in rounds and was ready to go home later on day two.   Discharge Medications: Prior to Admission medications   Medication Sig Start Date End Date Taking? Authorizing Provider  aspirin 81 MG tablet Take 81 mg by mouth daily.    Yes Historical Provider, MD  b complex vitamins tablet Take 1 tablet by mouth  daily.   Yes Historical Provider, MD  Calcium Carbonate (CALCIUM 600 PO) Take 2 tablets by mouth daily.   Yes Historical Provider, MD  Cholecalciferol (VITAMIN D) 2000 UNITS tablet Take 4,000 Units by mouth daily.    Yes Historical Provider, MD  Coenzyme Q10 (CO Q-10) 100 MG CAPS Take 1 tablet by mouth daily.    Yes Historical Provider, MD  estradiol (ESTRING) 2 MG vaginal ring Place 2 mg vaginally every 3 (three) months. 06/03/13  Yes Myra Marijo Sanes, MD  Glucos-MSM-C-Mn-Ginger-Willow (GLUCOSAMINE MSM COMPLEX) TABS Take 1 tablet by mouth 2 (two) times daily. 1500 mg by mouth two times a day.   Yes Historical Provider, MD  magnesium oxide (MAG-OX) 400 MG tablet Take 400 mg by mouth daily.   Yes Historical Provider, MD  Multiple Vitamin (MULTIVITAMIN) tablet Take 1 tablet by mouth daily.   Yes Historical Provider, MD  Omega-3 Fatty Acids (FISH OIL) 1200 MG CAPS Take 1 capsule by mouth every morning.   Yes Historical Provider, MD  rosuvastatin (CRESTOR) 10 MG tablet Take 10 mg by mouth. Take two times a week. 11/05/10  Yes Amy Cletis [place], MD  triamterene-hydrochlorothiazide (MAXZIDE-25) 37.5-25 MG per tablet Take 0.5 tablets by mouth daily. 02/16/13  Yes Amy Cletis [place], MD  methocarbamol (ROBAXIN) 500 MG tablet Take 1 tablet (500 mg  total) by mouth every 6 (six) hours as needed for muscle spasms. 06/08/13   Alexzandrew Perkins, PA-C  oxyCODONE (OXY IR/ROXICODONE) 5 MG immediate release tablet Take 1-2 tablets (5-10 mg total) by mouth every 3 (three) hours as needed for breakthrough pain. 06/08/13   Alexzandrew Dara Lords, PA-C  rivaroxaban (XARELTO) 10 MG TABS tablet Take 1 tablet (10 mg total) by mouth daily with breakfast. Take Xarelto for two and a half more weeks, then discontinue Xarelto. Once the patient has completed the Xarelto, they may resume the 81 mg Aspirin. 06/08/13   Alexzandrew Perkins, PA-C  traMADol (ULTRAM) 50 MG tablet Take 1-2 tablets (50-100 mg total) by mouth every 6 (six) hours as needed (mild to moderate pain). 06/08/13   Alexzandrew Dara Lords, PA-C   Discharge home with home health  Diet - Cardiac diet  Follow up - in 2 weeks  Activity - WBAT  Disposition - Home  Condition Upon Discharge - Good  D/C Meds - See DC Summary  DVT Prophylaxis - Xarelto      Discharge Orders   Future Appointments Provider Department Dept Phone   10/28/2013 9:00 AM [facility] [phone]   10/28/2013 9:30 AM Deatra Robinson, MD [facility] [phone]   Future Orders Complete By Expires   Call MD / Call 911  As directed    Comments:     If you experience chest pain or shortness of breath, CALL 911 and be transported to the hospital emergency room.  If you develope a fever above 101 F, pus (white drainage) or increased drainage or redness at the wound, or calf pain, call your surgeon's office.   Change dressing  As directed    Comments:     Change dressing daily with sterile 4 x 4 inch gauze dressing and apply TED hose. Do not submerge the incision under water.   Constipation Prevention  As directed    Comments:     Drink plenty of fluids.  Prune juice may be helpful.  You may use a stool softener, such as Colace (over the counter) 100 mg twice  a day.  Use MiraLax (over the counter) for constipation as needed.   Diet - low sodium heart healthy  As directed    Discharge instructions  As directed    Comments:     Pick up stool softner and laxative for home. Do not submerge incision under water. May shower. Continue to use ice for pain and swelling from surgery.  Take Xarelto for two and a half more weeks, then discontinue Xarelto. Once the patient has completed the Xarelto, they may resume the 81 mg Aspirin.   Do not put a pillow under the knee. Place it under the heel.  As directed    Do not sit on low chairs, stoools or toilet seats, as it may be difficult to get up from low surfaces  As directed    Driving restrictions  As directed    Comments:     No driving until released by the physician.   Increase activity slowly as tolerated  As directed    Lifting restrictions  As directed    Comments:     No lifting until released by the physician.   Patient may shower  As directed    Comments:     You may shower without a dressing once there is no drainage.  Do not wash over the wound.  If drainage remains, do not shower until drainage stops.   TED hose  As directed    Comments:     Use stockings (TED hose) for 3 weeks on both leg(s).  You may remove them at night for sleeping.   Weight bearing as tolerated  As directed    Questions:     Laterality:     Extremity:         Medication List    STOP taking these medications       aspirin 81 MG tablet     b complex vitamins tablet     CALCIUM 600 PO     Co Q-10 100 MG Caps     estradiol 2 MG vaginal ring  Commonly known as:  ESTRING     Fish Oil 1200 MG Caps     GLUCOSAMINE MSM COMPLEX Tabs     multivitamin tablet     Vitamin D 2000 UNITS tablet      TAKE these medications       magnesium oxide 400 MG tablet  Commonly known as:  MAG-OX  Take 400 mg by mouth daily.     methocarbamol 500 MG tablet  Commonly known as:  ROBAXIN  Take 1 tablet (500 mg total)  by mouth every 6 (six) hours as needed for muscle spasms.     oxyCODONE 5 MG immediate release tablet  Commonly known as:  Oxy IR/ROXICODONE  Take 1-2 tablets (5-10 mg total) by mouth every 3 (three) hours as needed for breakthrough pain.     rivaroxaban 10 MG Tabs tablet  Commonly known as:  XARELTO  - Take 1 tablet (10 mg total) by mouth daily with breakfast. Take Xarelto for two and a half more weeks, then discontinue Xarelto.  - Once the patient has completed the Xarelto, they may resume the 81 mg Aspirin.     rosuvastatin 10 MG tablet  Commonly known as:  CRESTOR  Take 10 mg by mouth. Take two times a week.     traMADol 50 MG tablet  Commonly known as:  ULTRAM  Take 1-2 tablets (50-100 mg total) by mouth every 6 (six) hours  as needed (mild to moderate pain).     triamterene-hydrochlorothiazide 37.5-25 MG per tablet  Commonly known as:  MAXZIDE-25  Take 0.5 tablets by mouth daily.       Follow-up Information   Follow up with [name], MD. Schedule an appointment as soon as possible for a visit in 2 weeks.   Specialty:  Orthopedic Surgery   Contact information:   [address] [place] [address] [phone]       Signed: Mickel Crow 06/16/2013, 10:04 AM

## 2013-06-08 NOTE — Progress Notes (Signed)
RN reviewed discharge instructions with patient. All questions answered. Prescriptions, dressing supplies and paperwork given to patient.   Patient wheeled down with NT to car.

## 2013-06-08 NOTE — Progress Notes (Signed)
Physical Therapy Treatment Patient Details Name: Darlene Lawrence MRN: [id number] DOB: 02/26/1941 Today's Date: 06/08/2013 Time: 1000-1030 PT Time Calculation (min): 30 min  PT Assessment / Plan / Recommendation  History of Present Illness R TKA   PT Comments   *Pt is ready to DC home from PT standpoint. She walked 200' independently, stair training completed, progressing well with TKA exercises. Encouraged frequent ambulation and performance of TKA exercises TID at home. *  Follow Up Recommendations  Home health PT     Does the patient have the potential to tolerate intense rehabilitation     Barriers to Discharge        Equipment Recommendations  None recommended by PT    Recommendations for Other Services    Frequency 7X/week   Progress towards PT Goals Progress towards PT goals: Goals met/education completed, patient discharged from PT  Plan Current plan remains appropriate    Precautions / Restrictions Precautions Precautions: Knee Knee Immobilizer - Right:  (Pt able to do SLR, DC'd KI) Restrictions Weight Bearing Restrictions: No   Pertinent Vitals/Pain **4/10 R knee Premedicated, ice applied*    Mobility  Transfers Overall transfer level: Needs assistance Equipment used: Rolling walker (2 wheeled) Transfers: Sit to/from Stand Sit to Stand: Modified independent (Device/Increase time) General transfer comment: with UE assist Ambulation/Gait Ambulation/Gait assistance: Modified independent (Device/Increase time) Ambulation Distance (Feet): 200 Feet Assistive device: Rolling walker (2 wheeled) Gait Pattern/deviations: Step-through pattern;Antalgic Gait velocity: decr 2* pain General Gait Details: steady, no LOB, good sequencing Stairs: Yes Stairs assistance: Min assist Stair Management: No rails;With walker;Backwards;Step to pattern Number of Stairs: 3 General stair comments: min A for RW    Exercises Total Joint Exercises Ankle Circles/Pumps: AROM;Both;10  reps;Supine Quad Sets: AROM;Right;5 reps;Supine Towel Squeeze: AROM;Both;10 reps Short Arc QuadSinclair Lawrence;Right;10 reps;Supine Heel Slides: AAROM;Right;10 reps;Supine Hip ABduction/ADduction: AROM;Right;10 reps;Supine Straight Leg Raises: AAROM;Right;10 reps;Supine;AROM Long Arc Quad: AAROM;Right;10 reps;Seated Knee Flexion: AAROM;Right;Seated Goniometric ROM: R knee flexion AAROM 65*, ext -10*   PT Diagnosis:    PT Problem List:   PT Treatment Interventions:     PT Goals (current goals can now be found in the care plan section) Acute Rehab PT Goals Patient Stated Goal: to walk in the neighborhood PT Goal Formulation: With patient Time For Goal Achievement: 06/10/13 Potential to Achieve Goals: Good  Visit Information  Last PT Received On: 06/08/13 History of Present Illness: R TKA    Subjective Data  Patient Stated Goal: to walk in the neighborhood   Cognition  Cognition Arousal/Alertness: Awake/alert Behavior During Therapy: WFL for tasks assessed/performed Overall Cognitive Status: Within Functional Limits for tasks assessed    Balance     End of Session PT - End of Session Equipment Utilized During Treatment: Gait belt Activity Tolerance: Patient tolerated treatment well Patient left: with call bell/phone within reach;in chair Nurse Communication: Mobility status CPM Right Knee CPM Right Knee: Off   GP     Darlene Lawrence 06/08/2013, 10:45 AM [phone]

## 2013-06-08 NOTE — Care Management Note (Signed)
    Page 1 of 1   06/08/2013     11:39:37 AM   CARE MANAGEMENT NOTE 06/08/2013  Patient:  IRISHA, GRANDMAISON   Account Number:  [id number]  Date Initiated:  06/07/2013  Documentation initiated by:  Sherrin Daisy  Subjective/Objective Assessment:   DX RT TOTAL KNEE REPLACEMNT    Referral from doctor's office to [facility] to provide [facility] services. Services will start day after discharge.     Action/Plan:   CM spoke with patient. Plans are for her to return to her home in  [place] where spouse will be caregiver. She already has DME> [facility] will provide [facility] services.   Anticipated DC Date:  06/08/2013   Anticipated DC Plan:  [place]  CM consult      [facility] Choice  HOME HEALTH   Choice offered to / List presented to:  C-1 Patient        [place] arranged  HH-2 PT      [place]   Status of service:  Completed, signed off Medicare Important Message given?  NA - LOS <3 / Initial given by admissions (If response is "NO", the following Medicare IM given date fields will be blank) Date Medicare IM given:   Date Additional Medicare IM given:    Discharge Disposition:  [place]  Per UR Regulation:    If discussed at Long Length of Stay Meetings, dates discussed:    Comments:

## 2013-06-09 DIAGNOSIS — I10 Essential (primary) hypertension: Secondary | ICD-10-CM | POA: Diagnosis not present

## 2013-06-09 DIAGNOSIS — Z96659 Presence of unspecified artificial knee joint: Secondary | ICD-10-CM | POA: Diagnosis not present

## 2013-06-09 DIAGNOSIS — E785 Hyperlipidemia, unspecified: Secondary | ICD-10-CM | POA: Diagnosis not present

## 2013-06-09 DIAGNOSIS — IMO0001 Reserved for inherently not codable concepts without codable children: Secondary | ICD-10-CM | POA: Diagnosis not present

## 2013-06-09 DIAGNOSIS — Z471 Aftercare following joint replacement surgery: Secondary | ICD-10-CM | POA: Diagnosis not present

## 2013-06-21 DIAGNOSIS — S62639A Displaced fracture of distal phalanx of unspecified finger, initial encounter for closed fracture: Secondary | ICD-10-CM | POA: Diagnosis not present

## 2013-06-21 DIAGNOSIS — M20019 Mallet finger of unspecified finger(s): Secondary | ICD-10-CM | POA: Diagnosis not present

## 2013-06-22 NOTE — Addendum Note (Signed)
Addendum created 06/22/13 1047 by Myrtie Soman, MD   Modules edited: Anesthesia Responsible Staff

## 2013-06-29 ENCOUNTER — Encounter: Payer: Self-pay | Admitting: Orthopedic Surgery

## 2013-06-29 DIAGNOSIS — R262 Difficulty in walking, not elsewhere classified: Secondary | ICD-10-CM | POA: Diagnosis not present

## 2013-06-29 DIAGNOSIS — IMO0001 Reserved for inherently not codable concepts without codable children: Secondary | ICD-10-CM | POA: Diagnosis not present

## 2013-06-29 DIAGNOSIS — M25549 Pain in joints of unspecified hand: Secondary | ICD-10-CM | POA: Diagnosis not present

## 2013-06-29 DIAGNOSIS — M25669 Stiffness of unspecified knee, not elsewhere classified: Secondary | ICD-10-CM | POA: Diagnosis not present

## 2013-07-01 DIAGNOSIS — M25549 Pain in joints of unspecified hand: Secondary | ICD-10-CM | POA: Diagnosis not present

## 2013-07-01 DIAGNOSIS — IMO0001 Reserved for inherently not codable concepts without codable children: Secondary | ICD-10-CM | POA: Diagnosis not present

## 2013-07-01 DIAGNOSIS — M25669 Stiffness of unspecified knee, not elsewhere classified: Secondary | ICD-10-CM | POA: Diagnosis not present

## 2013-07-01 DIAGNOSIS — R262 Difficulty in walking, not elsewhere classified: Secondary | ICD-10-CM | POA: Diagnosis not present

## 2013-07-04 DIAGNOSIS — M25669 Stiffness of unspecified knee, not elsewhere classified: Secondary | ICD-10-CM | POA: Diagnosis not present

## 2013-07-04 DIAGNOSIS — R262 Difficulty in walking, not elsewhere classified: Secondary | ICD-10-CM | POA: Diagnosis not present

## 2013-07-04 DIAGNOSIS — M25549 Pain in joints of unspecified hand: Secondary | ICD-10-CM | POA: Diagnosis not present

## 2013-07-04 DIAGNOSIS — IMO0001 Reserved for inherently not codable concepts without codable children: Secondary | ICD-10-CM | POA: Diagnosis not present

## 2013-07-10 ENCOUNTER — Encounter: Payer: Self-pay | Admitting: Orthopedic Surgery

## 2013-07-10 DIAGNOSIS — M25669 Stiffness of unspecified knee, not elsewhere classified: Secondary | ICD-10-CM | POA: Diagnosis not present

## 2013-07-10 DIAGNOSIS — R262 Difficulty in walking, not elsewhere classified: Secondary | ICD-10-CM | POA: Diagnosis not present

## 2013-07-10 DIAGNOSIS — M25559 Pain in unspecified hip: Secondary | ICD-10-CM | POA: Diagnosis not present

## 2013-07-10 DIAGNOSIS — IMO0001 Reserved for inherently not codable concepts without codable children: Secondary | ICD-10-CM | POA: Diagnosis not present

## 2013-07-11 DIAGNOSIS — M62838 Other muscle spasm: Secondary | ICD-10-CM | POA: Diagnosis not present

## 2013-07-11 DIAGNOSIS — M9981 Other biomechanical lesions of cervical region: Secondary | ICD-10-CM | POA: Diagnosis not present

## 2013-07-11 DIAGNOSIS — M999 Biomechanical lesion, unspecified: Secondary | ICD-10-CM | POA: Diagnosis not present

## 2013-07-11 DIAGNOSIS — M5030 Other cervical disc degeneration, unspecified cervical region: Secondary | ICD-10-CM | POA: Diagnosis not present

## 2013-07-14 DIAGNOSIS — M79609 Pain in unspecified limb: Secondary | ICD-10-CM | POA: Diagnosis not present

## 2013-07-14 DIAGNOSIS — M1710 Unilateral primary osteoarthritis, unspecified knee: Secondary | ICD-10-CM | POA: Diagnosis not present

## 2013-07-19 DIAGNOSIS — S62639A Displaced fracture of distal phalanx of unspecified finger, initial encounter for closed fracture: Secondary | ICD-10-CM | POA: Diagnosis not present

## 2013-07-19 DIAGNOSIS — M20019 Mallet finger of unspecified finger(s): Secondary | ICD-10-CM | POA: Diagnosis not present

## 2013-07-19 DIAGNOSIS — M1710 Unilateral primary osteoarthritis, unspecified knee: Secondary | ICD-10-CM | POA: Diagnosis not present

## 2013-07-19 DIAGNOSIS — M25649 Stiffness of unspecified hand, not elsewhere classified: Secondary | ICD-10-CM | POA: Diagnosis not present

## 2013-07-20 ENCOUNTER — Other Ambulatory Visit: Payer: Self-pay | Admitting: Family Medicine

## 2013-07-20 DIAGNOSIS — Z853 Personal history of malignant neoplasm of breast: Secondary | ICD-10-CM

## 2013-08-02 DIAGNOSIS — M25649 Stiffness of unspecified hand, not elsewhere classified: Secondary | ICD-10-CM | POA: Diagnosis not present

## 2013-08-06 ENCOUNTER — Other Ambulatory Visit: Payer: Self-pay | Admitting: Family Medicine

## 2013-08-09 DIAGNOSIS — M999 Biomechanical lesion, unspecified: Secondary | ICD-10-CM | POA: Diagnosis not present

## 2013-08-09 DIAGNOSIS — M9981 Other biomechanical lesions of cervical region: Secondary | ICD-10-CM | POA: Diagnosis not present

## 2013-08-09 DIAGNOSIS — M62838 Other muscle spasm: Secondary | ICD-10-CM | POA: Diagnosis not present

## 2013-08-09 DIAGNOSIS — M5030 Other cervical disc degeneration, unspecified cervical region: Secondary | ICD-10-CM | POA: Diagnosis not present

## 2013-08-10 ENCOUNTER — Encounter: Payer: Self-pay | Admitting: Orthopedic Surgery

## 2013-08-16 DIAGNOSIS — M25649 Stiffness of unspecified hand, not elsewhere classified: Secondary | ICD-10-CM | POA: Diagnosis not present

## 2013-08-17 ENCOUNTER — Ambulatory Visit
Admission: RE | Admit: 2013-08-17 | Discharge: 2013-08-17 | Disposition: A | Discharge status: Home / Self Care | Payer: Medicare Other | Source: Ambulatory Visit | Attending: Family Medicine | Admitting: Family Medicine

## 2013-08-17 DIAGNOSIS — Z853 Personal history of malignant neoplasm of breast: Secondary | ICD-10-CM | POA: Diagnosis not present

## 2013-08-17 DIAGNOSIS — R928 Other abnormal and inconclusive findings on diagnostic imaging of breast: Secondary | ICD-10-CM | POA: Diagnosis not present

## 2013-08-30 DIAGNOSIS — M25649 Stiffness of unspecified hand, not elsewhere classified: Secondary | ICD-10-CM | POA: Diagnosis not present

## 2013-09-06 DIAGNOSIS — M5030 Other cervical disc degeneration, unspecified cervical region: Secondary | ICD-10-CM | POA: Diagnosis not present

## 2013-09-06 DIAGNOSIS — M999 Biomechanical lesion, unspecified: Secondary | ICD-10-CM | POA: Diagnosis not present

## 2013-09-06 DIAGNOSIS — M9981 Other biomechanical lesions of cervical region: Secondary | ICD-10-CM | POA: Diagnosis not present

## 2013-09-06 DIAGNOSIS — M62838 Other muscle spasm: Secondary | ICD-10-CM | POA: Diagnosis not present

## 2013-10-04 DIAGNOSIS — M9981 Other biomechanical lesions of cervical region: Secondary | ICD-10-CM | POA: Diagnosis not present

## 2013-10-04 DIAGNOSIS — M62838 Other muscle spasm: Secondary | ICD-10-CM | POA: Diagnosis not present

## 2013-10-04 DIAGNOSIS — M999 Biomechanical lesion, unspecified: Secondary | ICD-10-CM | POA: Diagnosis not present

## 2013-10-04 DIAGNOSIS — M5030 Other cervical disc degeneration, unspecified cervical region: Secondary | ICD-10-CM | POA: Diagnosis not present

## 2013-10-10 ENCOUNTER — Other Ambulatory Visit: Payer: Self-pay | Admitting: Gastroenterology

## 2013-10-10 DIAGNOSIS — Z1211 Encounter for screening for malignant neoplasm of colon: Secondary | ICD-10-CM | POA: Diagnosis not present

## 2013-10-10 DIAGNOSIS — Z800 Family history of malignant neoplasm of digestive organs: Secondary | ICD-10-CM | POA: Diagnosis not present

## 2013-10-10 DIAGNOSIS — D126 Benign neoplasm of colon, unspecified: Secondary | ICD-10-CM | POA: Diagnosis not present

## 2013-10-21 ENCOUNTER — Telehealth: Payer: Self-pay | Admitting: Hematology and Oncology

## 2013-10-21 NOTE — Telephone Encounter (Signed)
, °

## 2013-10-27 ENCOUNTER — Encounter: Payer: Self-pay | Admitting: *Deleted

## 2013-10-28 ENCOUNTER — Other Ambulatory Visit: Payer: Medicare Other

## 2013-10-28 ENCOUNTER — Ambulatory Visit: Payer: Medicare Other | Admitting: Oncology

## 2013-10-31 ENCOUNTER — Encounter: Payer: Self-pay | Admitting: Family Medicine

## 2013-10-31 DIAGNOSIS — M999 Biomechanical lesion, unspecified: Secondary | ICD-10-CM | POA: Diagnosis not present

## 2013-10-31 DIAGNOSIS — M62838 Other muscle spasm: Secondary | ICD-10-CM | POA: Diagnosis not present

## 2013-10-31 DIAGNOSIS — M5030 Other cervical disc degeneration, unspecified cervical region: Secondary | ICD-10-CM | POA: Diagnosis not present

## 2013-10-31 DIAGNOSIS — M9981 Other biomechanical lesions of cervical region: Secondary | ICD-10-CM | POA: Diagnosis not present

## 2013-11-28 DIAGNOSIS — M9981 Other biomechanical lesions of cervical region: Secondary | ICD-10-CM | POA: Diagnosis not present

## 2013-11-28 DIAGNOSIS — M5030 Other cervical disc degeneration, unspecified cervical region: Secondary | ICD-10-CM | POA: Diagnosis not present

## 2013-11-28 DIAGNOSIS — M62838 Other muscle spasm: Secondary | ICD-10-CM | POA: Diagnosis not present

## 2013-11-28 DIAGNOSIS — M999 Biomechanical lesion, unspecified: Secondary | ICD-10-CM | POA: Diagnosis not present

## 2013-12-14 DIAGNOSIS — H2510 Age-related nuclear cataract, unspecified eye: Secondary | ICD-10-CM | POA: Diagnosis not present

## 2013-12-26 DIAGNOSIS — M5030 Other cervical disc degeneration, unspecified cervical region: Secondary | ICD-10-CM | POA: Diagnosis not present

## 2013-12-26 DIAGNOSIS — M9981 Other biomechanical lesions of cervical region: Secondary | ICD-10-CM | POA: Diagnosis not present

## 2013-12-26 DIAGNOSIS — M999 Biomechanical lesion, unspecified: Secondary | ICD-10-CM | POA: Diagnosis not present

## 2013-12-26 DIAGNOSIS — M62838 Other muscle spasm: Secondary | ICD-10-CM | POA: Diagnosis not present

## 2014-01-12 ENCOUNTER — Other Ambulatory Visit: Payer: Self-pay

## 2014-01-12 DIAGNOSIS — C50919 Malignant neoplasm of unspecified site of unspecified female breast: Secondary | ICD-10-CM

## 2014-01-13 ENCOUNTER — Ambulatory Visit (HOSPITAL_BASED_OUTPATIENT_CLINIC_OR_DEPARTMENT_OTHER): Payer: Medicare Other | Admitting: Hematology and Oncology

## 2014-01-13 ENCOUNTER — Other Ambulatory Visit (HOSPITAL_BASED_OUTPATIENT_CLINIC_OR_DEPARTMENT_OTHER): Payer: Medicare Other

## 2014-01-13 ENCOUNTER — Encounter: Payer: Self-pay | Admitting: Hematology and Oncology

## 2014-01-13 VITALS — BP 145/79 | HR 58 | Temp 98.8°F | Resp 18 | Ht 65.0 in | Wt 210.4 lb

## 2014-01-13 DIAGNOSIS — Z853 Personal history of malignant neoplasm of breast: Secondary | ICD-10-CM

## 2014-01-13 DIAGNOSIS — Z780 Asymptomatic menopausal state: Secondary | ICD-10-CM

## 2014-01-13 DIAGNOSIS — C50919 Malignant neoplasm of unspecified site of unspecified female breast: Secondary | ICD-10-CM

## 2014-01-13 LAB — COMPREHENSIVE METABOLIC PANEL (CC13)
ALT: 26 U/L (ref 0–55)
ANION GAP: 8 meq/L (ref 3–11)
AST: 19 U/L (ref 5–34)
Albumin: 3.5 g/dL (ref 3.5–5.0)
Alkaline Phosphatase: 71 U/L (ref 40–150)
BUN: 27.3 mg/dL — ABNORMAL HIGH (ref 7.0–26.0)
CO2: 28 meq/L (ref 22–29)
Calcium: 9.5 mg/dL (ref 8.4–10.4)
Chloride: 106 mEq/L (ref 98–109)
Creatinine: 1.1 mg/dL (ref 0.6–1.1)
Glucose: 98 mg/dl (ref 70–140)
POTASSIUM: 4.1 meq/L (ref 3.5–5.1)
Sodium: 142 mEq/L (ref 136–145)
TOTAL PROTEIN: 7.5 g/dL (ref 6.4–8.3)
Total Bilirubin: 0.36 mg/dL (ref 0.20–1.20)

## 2014-01-13 LAB — CBC WITH DIFFERENTIAL/PLATELET
BASO%: 1 % (ref 0.0–2.0)
Basophils Absolute: 0 10*3/uL (ref 0.0–0.1)
EOS%: 5.1 % (ref 0.0–7.0)
Eosinophils Absolute: 0.2 10*3/uL (ref 0.0–0.5)
HEMATOCRIT: 39.7 % (ref 34.8–46.6)
HGB: 12.9 g/dL (ref 11.6–15.9)
LYMPH#: 2 10*3/uL (ref 0.9–3.3)
LYMPH%: 46 % (ref 14.0–49.7)
MCH: 32.8 pg (ref 25.1–34.0)
MCHC: 32.6 g/dL (ref 31.5–36.0)
MCV: 100.6 fL (ref 79.5–101.0)
MONO#: 0.4 10*3/uL (ref 0.1–0.9)
MONO%: 9.8 % (ref 0.0–14.0)
NEUT#: 1.7 10*3/uL (ref 1.5–6.5)
NEUT%: 38.1 % — AB (ref 38.4–76.8)
Platelets: 154 10*3/uL (ref 145–400)
RBC: 3.95 10*6/uL (ref 3.70–5.45)
RDW: 13.6 % (ref 11.2–14.5)
WBC: 4.4 10*3/uL (ref 3.9–10.3)

## 2014-01-13 NOTE — Assessment & Plan Note (Signed)
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 She reports no new problems or concerns. I reviewed the mammogram which was normal in April 2015. Today's breast exam was also normal. I recommended continued annual follow up with mammograms and followups every April.  Patient has not had a DEXA scan in 2 years I would like to schedule that in April 2016. I encouraged her to continue take [place] vitamin D.Discussed the importance of physical exercise in decreasing the likelihood of breast cancer recurrence. Recommended 30 mins daily 6 days a week of either brisk walking or cycling or swimming. Encouraged patient to eat more fruits and vegetables and decrease red meat.

## 2014-01-13 NOTE — Progress Notes (Signed)
Patient Care Team: Darlene Sanders, MD as PCP - General  DIAGNOSIS: No matching staging information was found for the patient.  SUMMARY OF ONCOLOGIC HISTORY:   ADENOCARCINOMA, BREAST   07/14/2007 Mammogram Mass in the right breast: Ultrasound showed a spiculated mass 10:00 position 7 x 7 x 8 mm   08/18/2007 Initial Diagnosis Invasive mammary cancer   08/26/2007 Breast MRI Right breast upper outer quadrant 9 x 8 x 8 mm mass without lymphadenopathy   10/06/2007 Surgery Right breast lumpectomy with SLN biopsy, grade 1 IDC 1 cm with associated DCIS no MVI EOMI the percent, PR 58%, Ki-67 90%, HER-2 negative   11/24/2007 - 12/24/2007 Radiation Therapy Adjuvant radiation therapy to the right breast   01/13/2008 - 01/13/2013 Anti-estrogen oral therapy Tamoxifen 20 mg daily x5 years    CHIEF COMPLIANT: Annual followup of history of breast cancer in 2009  INTERVAL HISTORY: Darlene Lawrence is a 73 year old Caucasian lady with above-mentioned history of breast cancer that was treated with surgery followed by radiation and tamoxifen for 5 years. She is surveillance with annual mammograms. She noted no new problems or concerns of the breast. She is extremely good health and stays active taking care of helping her children and grandchildren. Denies any problems or concerns today.   REVIEW OF SYSTEMS:   Constitutional: Denies fevers, chills or abnormal weight loss Eyes: Denies blurriness of vision Ears, nose, mouth, throat, and face: Denies mucositis or sore throat Respiratory: Denies cough, dyspnea or wheezes Cardiovascular: Denies palpitation, chest discomfort or lower extremity swelling Gastrointestinal:  Denies nausea, heartburn or change in bowel habits Skin: Denies abnormal skin rashes Lymphatics: Denies new lymphadenopathy or easy bruising Neurological:Denies numbness, tingling or new weaknesses Behavioral/Psych: Mood is stable, no new changes  Breast:  denies any pain or lumps or nodules in either breasts All  other systems were reviewed with the patient and are negative.  I have reviewed the past medical history, past surgical history, social history and family history with the patient and they are unchanged from previous note.  ALLERGIES:  is allergic to cefixime and penicillins.  MEDICATIONS:  Current Outpatient Prescriptions  Medication Sig Dispense Refill  . magnesium oxide (MAG-OX) 400 MG tablet Take 400 mg by mouth daily.      . rosuvastatin (CRESTOR) 10 MG tablet Take 10 mg by mouth. Take two times a week.      . triamterene-hydrochlorothiazide (MAXZIDE-25) 37.5-25 MG per tablet TAKE 1/2 TABLET BY MOUTH EVERY DAY  45 tablet  1   No current facility-administered medications for this visit.    PHYSICAL EXAMINATION: ECOG PERFORMANCE STATUS: 0 - Asymptomatic  Filed Vitals:   01/13/14 0951  BP: 145/79  Pulse: 58  Temp: 98.8 F (37.1 C)  Resp: 18   Filed Weights   01/13/14 0951  Weight: 210 lb 6 oz (95.425 kg)    GENERAL:alert, no distress and comfortable SKIN: skin color, texture, turgor are normal, no rashes or significant lesions EYES: normal, Conjunctiva are pink and non-injected, sclera clear OROPHARYNX:no exudate, no erythema and lips, buccal mucosa, and tongue normal  NECK: supple, thyroid normal size, non-tender, without nodularity LYMPH:  no palpable lymphadenopathy in the cervical, axillary or inguinal LUNGS: clear to auscultation and percussion with normal breathing effort HEART: regular rate & rhythm and no murmurs and no lower extremity edema ABDOMEN:abdomen soft, non-tender and normal bowel sounds Musculoskeletal:no cyanosis of digits and no clubbing  NEURO: alert & oriented x 3 with fluent speech, no focal motor/sensory deficits BREAST: No  palpable masses lungs or nodules in either right or left breasts. No palpable axillary supraclavicular or infraclavicular adenopathy no breast tenderness or nipple discharge.   LABORATORY DATA:  I have reviewed the data as  listed   Chemistry      Component Value Date/Time   NA 136* 06/08/2013 0428   NA 142 10/14/2012 0951   K 4.1 06/08/2013 0428   K 4.2 10/14/2012 0951   CL 101 06/08/2013 0428   CL 107 10/14/2012 0951   CO2 23 06/08/2013 0428   CO2 27 10/14/2012 0951   BUN 23 06/08/2013 0428   BUN 24.5 10/14/2012 0951   CREATININE 0.85 06/08/2013 0428   CREATININE 0.9 10/14/2012 0951      Component Value Date/Time   CALCIUM 8.2* 06/08/2013 0428   CALCIUM 9.1 10/14/2012 0951   ALKPHOS 64 05/30/2013 1040   ALKPHOS 53 10/14/2012 0951   AST 20 05/30/2013 1040   AST 18 10/14/2012 0951   ALT 24 05/30/2013 1040   ALT 20 10/14/2012 0951   BILITOT 0.3 05/30/2013 1040   BILITOT 0.35 10/14/2012 0951       Lab Results  Component Value Date   WBC 4.4 01/13/2014   HGB 12.9 01/13/2014   HCT 39.7 01/13/2014   MCV 100.6 01/13/2014   PLT 154 01/13/2014   NEUTROABS 1.7 01/13/2014     RADIOGRAPHIC STUDIES: I have personally reviewed the radiology reports and agreed with their findings. No results found.   ASSESSMENT & PLAN:  No problem-specific assessment & plan notes found for this encounter.   Orders Placed This Encounter  Procedures  . MM Digital Diagnostic Bilat    Standing Status: Future     Number of Occurrences:      Standing Expiration Date: 02/17/2015    Order Specific Question:  Reason for Exam (SYMPTOM  OR DIAGNOSIS REQUIRED)    Answer:  History of breast cancer annual followup    Order Specific Question:  Preferred imaging location?    Answer:  [facility]  . DG Bone Density    Standing Status: Future     Number of Occurrences:      Standing Expiration Date: 01/13/2015    Order Specific Question:  Reason for Exam (SYMPTOM  OR DIAGNOSIS REQUIRED)    Answer:  Postmenopausal on antiestrogen therapy    Order Specific Question:  Preferred imaging location?    Answer:  [facility]  . CBC & Diff and Retic    Standing Status: Future     Number of Occurrences:      Standing Expiration Date: 02/17/2015  . Comprehensive  metabolic panel    Standing Status: Future     Number of Occurrences:      Standing Expiration Date: 02/17/2015   The patient has a good understanding of the overall plan. she agrees with it. She will call with any problems that may develop before her next visit here.  I spent 20 minutes counseling the patient face to face. The total time spent in the appointment was 25 minutes and more than 50% was on counseling and review of test results    Rulon Eisenmenger, MD 01/13/2014 10:32 AM

## 2014-01-17 ENCOUNTER — Telehealth: Payer: Self-pay | Admitting: Hematology and Oncology

## 2014-01-17 ENCOUNTER — Other Ambulatory Visit: Payer: Self-pay | Admitting: Hematology and Oncology

## 2014-01-17 DIAGNOSIS — E2839 Other primary ovarian failure: Secondary | ICD-10-CM

## 2014-01-17 NOTE — Telephone Encounter (Signed)
s.w. pt and advised on April and May appt....pt ok and aware °

## 2014-01-23 DIAGNOSIS — M5030 Other cervical disc degeneration, unspecified cervical region: Secondary | ICD-10-CM | POA: Diagnosis not present

## 2014-01-23 DIAGNOSIS — M9981 Other biomechanical lesions of cervical region: Secondary | ICD-10-CM | POA: Diagnosis not present

## 2014-01-23 DIAGNOSIS — M999 Biomechanical lesion, unspecified: Secondary | ICD-10-CM | POA: Diagnosis not present

## 2014-01-23 DIAGNOSIS — M62838 Other muscle spasm: Secondary | ICD-10-CM | POA: Diagnosis not present

## 2014-02-08 ENCOUNTER — Other Ambulatory Visit: Payer: Self-pay | Admitting: Family Medicine

## 2014-02-08 NOTE — Telephone Encounter (Signed)
Last office visit 05/20/2013.  Ok to refill?

## 2014-02-20 DIAGNOSIS — M9902 Segmental and somatic dysfunction of thoracic region: Secondary | ICD-10-CM | POA: Diagnosis not present

## 2014-02-20 DIAGNOSIS — M62830 Muscle spasm of back: Secondary | ICD-10-CM | POA: Diagnosis not present

## 2014-02-20 DIAGNOSIS — M9901 Segmental and somatic dysfunction of cervical region: Secondary | ICD-10-CM | POA: Diagnosis not present

## 2014-02-20 DIAGNOSIS — Z23 Encounter for immunization: Secondary | ICD-10-CM | POA: Diagnosis not present

## 2014-02-20 DIAGNOSIS — M5033 Other cervical disc degeneration, cervicothoracic region: Secondary | ICD-10-CM | POA: Diagnosis not present

## 2014-03-13 ENCOUNTER — Encounter: Payer: Self-pay | Admitting: Hematology and Oncology

## 2014-03-20 DIAGNOSIS — M62830 Muscle spasm of back: Secondary | ICD-10-CM | POA: Diagnosis not present

## 2014-03-20 DIAGNOSIS — M9902 Segmental and somatic dysfunction of thoracic region: Secondary | ICD-10-CM | POA: Diagnosis not present

## 2014-03-20 DIAGNOSIS — M5033 Other cervical disc degeneration, cervicothoracic region: Secondary | ICD-10-CM | POA: Diagnosis not present

## 2014-03-20 DIAGNOSIS — M9901 Segmental and somatic dysfunction of cervical region: Secondary | ICD-10-CM | POA: Diagnosis not present

## 2014-04-17 DIAGNOSIS — M62830 Muscle spasm of back: Secondary | ICD-10-CM | POA: Diagnosis not present

## 2014-04-17 DIAGNOSIS — M9901 Segmental and somatic dysfunction of cervical region: Secondary | ICD-10-CM | POA: Diagnosis not present

## 2014-04-17 DIAGNOSIS — M5033 Other cervical disc degeneration, cervicothoracic region: Secondary | ICD-10-CM | POA: Diagnosis not present

## 2014-04-17 DIAGNOSIS — M9902 Segmental and somatic dysfunction of thoracic region: Secondary | ICD-10-CM | POA: Diagnosis not present

## 2014-05-15 ENCOUNTER — Encounter: Payer: Self-pay | Admitting: Family Medicine

## 2014-05-15 ENCOUNTER — Ambulatory Visit (INDEPENDENT_AMBULATORY_CARE_PROVIDER_SITE_OTHER): Payer: Medicare Other | Admitting: Family Medicine

## 2014-05-15 VITALS — BP 124/72 | HR 58 | Temp 98.3°F | Ht 65.0 in | Wt 211.2 lb

## 2014-05-15 DIAGNOSIS — J189 Pneumonia, unspecified organism: Secondary | ICD-10-CM | POA: Diagnosis not present

## 2014-05-15 MED ORDER — AZITHROMYCIN 250 MG PO TABS: ORAL_TABLET | ORAL | Status: DC

## 2014-05-15 NOTE — Progress Notes (Signed)
Pre visit review using our clinic review tool, if applicable. No additional management support is needed unless otherwise documented below in the visit note. 

## 2014-05-15 NOTE — Progress Notes (Signed)
   Dr. Frederico Hamman T. Birtha Hatler, MD, [facility] and [facility] Medicine [place], [address] Phone: [phone] Fax: [phone]  05/15/2014  Patient: Darlene Lawrence, MRN: [id number], DOB: 1940-12-11, 74 y.o.  Primary Physician:  Eliezer Lofts, MD  Chief Complaint: Cough and Nasal Congestion  Subjective:   Darlene Lawrence is a 74 y.o. very pleasant female patient who presents with the following:  Having a bad cough and started in November. ? Sick and fever on Friday. Decreased eating. Cough never really went away at all. She was really feeling fairly bad and she has a productive cough. She is taking some NyQuil at night which is helping. She thinks that she did have a fever several days ago. She did improve for a short amount of time, about a month ago, but she has been sick since early November with a brief respite, and it sounds as if she caught an additional illness. This secondary illnesses been ongoing for approximately 2 months.  No significant sore throat. Some ear congestion.  Past Medical History, Surgical History, Social History, Family History, Problem List, Medications, and Allergies have been reviewed and updated if relevant.  ROS: GEN: Acute illness details above GI: Tolerating PO intake GU: maintaining adequate hydration and urination Pulm: No SOB Interactive and getting along well at home.  Otherwise, ROS is as per the HPI.   Objective:   BP 124/72 mmHg  Pulse 58  Temp(Src) 98.3 F (36.8 C) (Oral)  Ht 5\' 5"  (1.651 m)  Wt 211 lb 4 oz (95.822 kg)  BMI 35.15 kg/m2   GEN: A and O x 3. WDWN. NAD.    ENT: Nose clear, ext NML.  No LAD.  No JVD.  TM's clear. Oropharynx clear.  PULM: Normal WOB, no distress. B diffuse rhonchi CV: RRR, no M/G/R, No rubs, No JVD.   EXT: warm and well-perfused, No c/c/e. PSYCH: Pleasant and conversant.    Laboratory and Imaging Data:  Assessment and Plan:   Walking pneumonia   Given the length  of symptoms and pulmonary exam, treat for walking pneumonia with Zithromax. Pertussis could also be possible.  Follow-up: No Follow-up on file.  New Prescriptions   AZITHROMYCIN (ZITHROMAX) 250 MG TABLET    2 tabs po on day 1, then 1 tab po for 4 days   No orders of the defined types were placed in this encounter.    Signed,  Maud Deed. Dailin Sosnowski, MD   Patient's Medications  New Prescriptions   AZITHROMYCIN (ZITHROMAX) 250 MG TABLET    2 tabs po on day 1, then 1 tab po for 4 days  Previous Medications   MAGNESIUM OXIDE (MAG-OX) 400 MG TABLET    Take 400 mg by mouth daily.   ROSUVASTATIN (CRESTOR) 10 MG TABLET    Take 10 mg by mouth. Take two times a week.   TRIAMTERENE-HYDROCHLOROTHIAZIDE (MAXZIDE-25) 37.5-25 MG PER TABLET    TAKE 1/2 TABLET BY MOUTH EVERY DAY  Modified Medications   No medications on file  Discontinued Medications   No medications on file

## 2014-05-16 DIAGNOSIS — M62830 Muscle spasm of back: Secondary | ICD-10-CM | POA: Diagnosis not present

## 2014-05-16 DIAGNOSIS — M9901 Segmental and somatic dysfunction of cervical region: Secondary | ICD-10-CM | POA: Diagnosis not present

## 2014-05-16 DIAGNOSIS — M5033 Other cervical disc degeneration, cervicothoracic region: Secondary | ICD-10-CM | POA: Diagnosis not present

## 2014-05-16 DIAGNOSIS — M9902 Segmental and somatic dysfunction of thoracic region: Secondary | ICD-10-CM | POA: Diagnosis not present

## 2014-05-30 DIAGNOSIS — Z96651 Presence of right artificial knee joint: Secondary | ICD-10-CM | POA: Diagnosis not present

## 2014-05-30 DIAGNOSIS — Z471 Aftercare following joint replacement surgery: Secondary | ICD-10-CM | POA: Diagnosis not present

## 2014-05-30 DIAGNOSIS — Z96642 Presence of left artificial hip joint: Secondary | ICD-10-CM | POA: Diagnosis not present

## 2014-05-31 ENCOUNTER — Ambulatory Visit (INDEPENDENT_AMBULATORY_CARE_PROVIDER_SITE_OTHER): Payer: Medicare Other | Admitting: Family Medicine

## 2014-05-31 ENCOUNTER — Ambulatory Visit (INDEPENDENT_AMBULATORY_CARE_PROVIDER_SITE_OTHER)
Admission: RE | Admit: 2014-05-31 | Discharge: 2014-05-31 | Disposition: A | Discharge status: Home / Self Care | Payer: Medicare Other | Source: Ambulatory Visit | Attending: Family Medicine | Admitting: Family Medicine

## 2014-05-31 ENCOUNTER — Encounter: Payer: Self-pay | Admitting: Family Medicine

## 2014-05-31 VITALS — BP 126/74 | HR 60 | Temp 97.4°F

## 2014-05-31 DIAGNOSIS — R05 Cough: Secondary | ICD-10-CM

## 2014-05-31 DIAGNOSIS — R058 Other specified cough: Secondary | ICD-10-CM

## 2014-05-31 DIAGNOSIS — R059 Cough, unspecified: Secondary | ICD-10-CM

## 2014-05-31 MED ORDER — LEVOFLOXACIN 500 MG PO TABS
500.0000 mg | ORAL_TABLET | Freq: Every day | ORAL | Status: DC
Start: 2014-05-31 — End: 2014-07-04

## 2014-05-31 NOTE — Progress Notes (Signed)
Dr. Frederico Hamman T. Christ Fullenwider, MD, [facility] Primary [facility] and Sports Medicine [place], [address] Phone: [phone] Fax: [phone]  05/31/2014  Patient: Darlene Lawrence, MRN: [id number], DOB: 03-10-1941, 74 y.o.  Primary Physician:  Eliezer Lofts, MD  Chief Complaint: Follow-up and Walking Pneumonia  Subjective:   Darlene Lawrence is a 74 y.o. very pleasant female patient who presents with the following:  Better, but still coughing a lot.  S/p Breast CA Never smoker.   05/15/2014 Last OV with Owens Loffler, MD  That point she had been coughing for many weeks, since November, and I placed her on some Zithromax. She is better now, but she does still have a dry cough. Overall, she is improved.  Past Medical History, Surgical History, Social History, Family History, Problem List, Medications, and Allergies have been reviewed and updated if relevant.  ROS: GEN: Acute illness details above GI: Tolerating PO intake GU: maintaining adequate hydration and urination Pulm: No SOB Interactive and getting along well at home.  Otherwise, ROS is as per the HPI.   Objective:   BP 126/74 mmHg  Pulse 60  Temp(Src) 97.4 F (36.3 C) (Oral)  SpO2 98%   GEN: A and O x 3. WDWN. NAD.    ENT: Nose clear, ext NML.  No LAD.  No JVD.  TM's clear. Oropharynx clear.  PULM: Normal WOB, no distress. No crackles, wheezes, rhonchi. CV: RRR, no M/G/R, No rubs, No JVD.   EXT: warm and well-perfused, No c/c/e. PSYCH: Pleasant and conversant.    Laboratory and Imaging Data: Dg Chest 2 View  05/31/2014   CLINICAL DATA:  Prolonged cough.  EXAM: CHEST  2 VIEW  COMPARISON:  05/30/2013, 04/17/2010.  FINDINGS: Mediastinum is normal. Mild right hilar fullness noted. Similar findings noted on prior exam. These changes are most likely related stable small lymph nodes. Lungs are clear of acute infiltrates. Heart size normal. Degenerative changes thoracic spine. Mid thoracic spine  compression fractures.  IMPRESSION: No acute cardiopulmonary disease. Stable small right hilar lymph nodes.   Electronically Signed   By: Marcello Moores  Register   On: 05/31/2014 13:24     Assessment and Plan:   Post-viral cough syndrome  Cough - Plan: DG Chest 2 View  This is likely all post viral cough. Given that the patient is elderly and we have inclement weather coming, also gave her a paper prescription of antibiotics. If she is not fully improved in 7-10 days, then it is reasonable to fill it, or if she starts to develop a fever.  Follow-up: No Follow-up on file.  New Prescriptions   LEVOFLOXACIN (LEVAQUIN) 500 MG TABLET    Take 1 tablet (500 mg total) by mouth daily.   Orders Placed This Encounter  Procedures  . DG Chest 2 View    Signed,  Frederico Hamman T. Cyntia Staley, MD   Patient's Medications  New Prescriptions   LEVOFLOXACIN (LEVAQUIN) 500 MG TABLET    Take 1 tablet (500 mg total) by mouth daily.  Previous Medications   ASPIRIN (ASPIR-81 PO)    Take 1 tablet by mouth.   B COMPLEX VITAMINS TABLET    Take 1 tablet by mouth daily.   CALCIUM CITRATE-VITAMIN D (CALCIUM CITRATE + PO)    Take 1 tablet by mouth daily.   CHOLECALCIFEROL (VITAMIN D3) 2000 UNITS TABS    Take 1 capsule by mouth 2 (two) times daily.   COENZYME Q10 (COQ10) 100 MG CAPS    Take  1 capsule by mouth daily.   MAGNESIUM 400 MG CAPS    Take 1 capsule by mouth daily.   MAGNESIUM OXIDE (MAG-OX) 400 MG TABLET    Take 400 mg by mouth daily.   MULTIPLE VITAMIN (MULTI VITAMIN DAILY PO)    Take 1 tablet by mouth.   OMEGA-3 FATTY ACIDS (FISH OIL) 1200 MG CAPS    Take 1 capsule by mouth.   ROSUVASTATIN (CRESTOR) 10 MG TABLET    Take 10 mg by mouth. Take two times a week.   TRIAMTERENE-HYDROCHLOROTHIAZIDE (MAXZIDE-25) 37.5-25 MG PER TABLET    TAKE 1/2 TABLET BY MOUTH EVERY DAY  Modified Medications   No medications on file  Discontinued Medications   AZITHROMYCIN (ZITHROMAX) 250 MG TABLET    2 tabs po on day 1, then 1 tab po  for 4 days

## 2014-05-31 NOTE — Progress Notes (Signed)
Pre visit review using our clinic review tool, if applicable. No additional management support is needed unless otherwise documented below in the visit note. 

## 2014-06-11 ENCOUNTER — Other Ambulatory Visit: Payer: Self-pay | Admitting: Family Medicine

## 2014-06-12 DIAGNOSIS — M5033 Other cervical disc degeneration, cervicothoracic region: Secondary | ICD-10-CM | POA: Diagnosis not present

## 2014-06-12 DIAGNOSIS — M62830 Muscle spasm of back: Secondary | ICD-10-CM | POA: Diagnosis not present

## 2014-06-12 DIAGNOSIS — M9902 Segmental and somatic dysfunction of thoracic region: Secondary | ICD-10-CM | POA: Diagnosis not present

## 2014-06-12 DIAGNOSIS — M9901 Segmental and somatic dysfunction of cervical region: Secondary | ICD-10-CM | POA: Diagnosis not present

## 2014-06-27 ENCOUNTER — Encounter: Payer: Self-pay | Admitting: Family Medicine

## 2014-06-27 ENCOUNTER — Other Ambulatory Visit (INDEPENDENT_AMBULATORY_CARE_PROVIDER_SITE_OTHER): Payer: Medicare Other

## 2014-06-27 ENCOUNTER — Ambulatory Visit (INDEPENDENT_AMBULATORY_CARE_PROVIDER_SITE_OTHER): Payer: Medicare Other | Admitting: Family Medicine

## 2014-06-27 ENCOUNTER — Telehealth: Payer: Self-pay | Admitting: Family Medicine

## 2014-06-27 VITALS — BP 120/68 | HR 58 | Temp 97.6°F | Ht 65.0 in | Wt 215.5 lb

## 2014-06-27 DIAGNOSIS — T50905A Adverse effect of unspecified drugs, medicaments and biological substances, initial encounter: Secondary | ICD-10-CM

## 2014-06-27 DIAGNOSIS — T887XXA Unspecified adverse effect of drug or medicament, initial encounter: Secondary | ICD-10-CM | POA: Diagnosis not present

## 2014-06-27 DIAGNOSIS — M25512 Pain in left shoulder: Secondary | ICD-10-CM

## 2014-06-27 DIAGNOSIS — H6980 Other specified disorders of Eustachian tube, unspecified ear: Secondary | ICD-10-CM | POA: Insufficient documentation

## 2014-06-27 DIAGNOSIS — E785 Hyperlipidemia, unspecified: Secondary | ICD-10-CM

## 2014-06-27 DIAGNOSIS — H6993 Unspecified Eustachian tube disorder, bilateral: Secondary | ICD-10-CM

## 2014-06-27 DIAGNOSIS — R058 Other specified cough: Secondary | ICD-10-CM

## 2014-06-27 DIAGNOSIS — R05 Cough: Secondary | ICD-10-CM | POA: Diagnosis not present

## 2014-06-27 DIAGNOSIS — H6990 Unspecified Eustachian tube disorder, unspecified ear: Secondary | ICD-10-CM | POA: Insufficient documentation

## 2014-06-27 DIAGNOSIS — H6983 Other specified disorders of Eustachian tube, bilateral: Secondary | ICD-10-CM | POA: Diagnosis not present

## 2014-06-27 LAB — COMPREHENSIVE METABOLIC PANEL
ALBUMIN: 3.6 g/dL (ref 3.5–5.2)
ALK PHOS: 65 U/L (ref 39–117)
ALT: 25 U/L (ref 0–35)
AST: 21 U/L (ref 0–37)
BILIRUBIN TOTAL: 0.5 mg/dL (ref 0.2–1.2)
BUN: 32 mg/dL — AB (ref 6–23)
CALCIUM: 9.1 mg/dL (ref 8.4–10.5)
CO2: 29 mEq/L (ref 19–32)
CREATININE: 1.02 mg/dL (ref 0.40–1.20)
Chloride: 105 mEq/L (ref 96–112)
GFR: 56.66 mL/min — ABNORMAL LOW (ref >60.00)
Glucose, Bld: 96 mg/dL (ref 70–99)
POTASSIUM: 4 meq/L (ref 3.5–5.1)
Sodium: 139 mEq/L (ref 135–145)
Total Protein: 7.3 g/dL (ref 6.0–8.3)

## 2014-06-27 LAB — LIPID PANEL
CHOLESTEROL: 169 mg/dL (ref 0–200)
HDL: 47.6 mg/dL (ref >39.00)
LDL Cholesterol: 94 mg/dL (ref 0–99)
NONHDL: 121.4
Total CHOL/HDL Ratio: 4
Triglycerides: 136 mg/dL (ref 0.0–149.0)
VLDL: 27.2 mg/dL (ref 0.0–40.0)

## 2014-06-27 MED ORDER — DICLOFENAC SODIUM 75 MG PO TBEC: 75.0000 mg | DELAYED_RELEASE_TABLET | Freq: Two times a day (BID) | ORAL | Status: DC

## 2014-06-27 MED ORDER — ALBUTEROL SULFATE HFA 108 (90 BASE) MCG/ACT IN AERS: 2.0000 | INHALATION_SPRAY | Freq: Four times a day (QID) | RESPIRATORY_TRACT | Status: DC | PRN

## 2014-06-27 NOTE — Assessment & Plan Note (Signed)
Stop afrin. Start flonase, amy need prednisone course to stop.  Re-eval at upcoming CPX.

## 2014-06-27 NOTE — Assessment & Plan Note (Signed)
No sign of infeciton, CXR last OV nml.  Will treat with albuterol. May need course of prednisone if not better next OV.

## 2014-06-27 NOTE — Progress Notes (Signed)
Pre visit review using our clinic review tool, if applicable. No additional management support is needed unless otherwise documented below in the visit note. 

## 2014-06-27 NOTE — Assessment & Plan Note (Signed)
Most likely  Due to med reaction from levaquin.  Rotator cuff tendonitits.  Treat with NSAIDs, stop levaquin.

## 2014-06-27 NOTE — Telephone Encounter (Signed)
-----   Message from Ellamae Sia sent at 06/22/2014  3:25 PM EST ----- Regarding: Lab orders for Tuesday, 2.16.16 Patient is scheduled for CPX labs, please order future labs, Thanks , Karna Christmas

## 2014-06-27 NOTE — Progress Notes (Signed)
   Subjective:    Patient ID: Darlene Lawrence, female    DOB: Nov 14, 1940, 74 y.o.   MRN: [id number]  HPI   74 year old female presents with new onset left shoulder pain beginning on day after she started levaquin. Now ongoing 5 days. PAin 5/10 on pain scale. Pain worse at night, cannot lay on that side, pain worse with int, ext rot, abduction.  She has not been taking it for last 2 days. Pain has continued despite stopping medication. No redness, no warmth.  Pain is in left posterior shoulder. No weakness, no numbness in left upper ext. No neck pain. She has been using some aleve which has not helped.  She was started on levaquin for acute bronchitis.. Started on 2/10. She has history of osteoarthritis in knee.  No fever. NO other joint pain.   Still having coughing spells. Dry cough. Still congestion and pressures in ears.  On claritin, On afrin x  1.5 weeks. Not on flonase.  Review of Systems  Constitutional: Negative for fever and fatigue.  HENT: Negative for ear pain.   Eyes: Negative for pain.  Respiratory: Positive for cough. Negative for chest tightness, shortness of breath and wheezing.   Cardiovascular: Negative for chest pain, palpitations and leg swelling.  Gastrointestinal: Negative for abdominal pain.  Genitourinary: Negative for dysuria.       Objective:   Physical Exam  Constitutional: Vital signs are normal. She appears well-developed and well-nourished. She is cooperative.  Non-toxic appearance. She does not appear ill. No distress.  HENT:  Head: Normocephalic.  Right Ear: Hearing, external ear and ear canal normal. Tympanic membrane is not erythematous, not retracted and not bulging. A middle ear effusion is present.  Left Ear: Hearing, external ear and ear canal normal. Tympanic membrane is not erythematous, not retracted and not bulging. A middle ear effusion is present.  Nose: No mucosal edema or rhinorrhea. Right sinus exhibits no maxillary sinus  tenderness and no frontal sinus tenderness. Left sinus exhibits no maxillary sinus tenderness and no frontal sinus tenderness.  Mouth/Throat: Uvula is midline, oropharynx is clear and moist and mucous membranes are normal.  Eyes: Conjunctivae, EOM and lids are normal. Pupils are equal, round, and reactive to light. Lids are everted and swept, no foreign bodies found.  Neck: Trachea normal and normal range of motion. Neck supple. Carotid bruit is not present. No thyroid mass and no thyromegaly present.  Cardiovascular: Normal rate, regular rhythm, S1 normal, S2 normal, normal heart sounds, intact distal pulses and normal pulses.  Exam reveals no gallop and no friction rub.   No murmur heard. Pulmonary/Chest: Effort normal and breath sounds normal. No tachypnea. No respiratory distress. She has no decreased breath sounds. She has no wheezes. She has no rhonchi. She has no rales.  Abdominal: Soft. Normal appearance and bowel sounds are normal. There is no tenderness.  Musculoskeletal:       Left shoulder: She exhibits decreased range of motion and tenderness. She exhibits no bony tenderness and no deformity.  Pain with int, ext rot and abduction, positive neers  ttp over left post subacromial space.  Neurological: She is alert.  Skin: Skin is warm, dry and intact. No rash noted.  Psychiatric: Her speech is normal and behavior is normal. Judgment and thought content normal. Her mood appears not anxious. Cognition and memory are normal. She does not exhibit a depressed mood.          Assessment & Plan:

## 2014-06-27 NOTE — Patient Instructions (Signed)
Stop afrin. Start flonase 2 sprays per nostril daily. Can use albuterol inhaler to use for coughing fits. Expect several months for cough to improve. Stay off levaquin. Start diclofenac as needed for pain in shoulder.

## 2014-07-04 ENCOUNTER — Encounter: Payer: Self-pay | Admitting: Family Medicine

## 2014-07-04 ENCOUNTER — Ambulatory Visit (INDEPENDENT_AMBULATORY_CARE_PROVIDER_SITE_OTHER): Payer: Medicare Other | Admitting: Family Medicine

## 2014-07-04 VITALS — BP 120/76 | HR 59 | Temp 98.3°F | Ht 65.0 in | Wt 217.0 lb

## 2014-07-04 DIAGNOSIS — H6983 Other specified disorders of Eustachian tube, bilateral: Secondary | ICD-10-CM

## 2014-07-04 DIAGNOSIS — Z7189 Other specified counseling: Secondary | ICD-10-CM

## 2014-07-04 DIAGNOSIS — Z0000 Encounter for general adult medical examination without abnormal findings: Secondary | ICD-10-CM | POA: Diagnosis not present

## 2014-07-04 DIAGNOSIS — Z23 Encounter for immunization: Secondary | ICD-10-CM

## 2014-07-04 NOTE — Progress Notes (Signed)
I have personally reviewed the Medicare Annual Wellness questionnaire and have noted  1. The patient's medical and social history  2. Their use of alcohol, tobacco or illicit drugs  3. Their current medications and supplements  4. The patient's functional ability including ADL's, fall risks, home safety risks and hearing or visual  impairment.  5. Diet and physical activities  6. Evidence for depression or mood disorders  The patients weight, height, BMI and visual acuity have been recorded in the chart  I have made referrals, counseling and provided education to the patient based review of the above and I have provided the pt with a written personalized care plan for preventive services.   She was seen 1 week ago with left shoulder pain secondary to levaquin.  Now she reports her shoulder is slowly getting better.  Cough is better, ears still full of fluid. No ear pain. No fever.  She has changed to flonase.. Ear remain full.  Hypertension: Well controlled on current meds.  BP Readings from Last 3 Encounters:  07/04/14 120/76  06/27/14 120/68  05/31/14 126/74  Using medication without problems or lightheadedness: None Using medications without problems: None  Muscle aches: None  Other complaints:    Elevated Cholesterol: LDL at goal < 130 on crestor 10 mg daily twice a week , fish oil co Q 10. Lab Results  Component Value Date   CHOL 169 06/27/2014   HDL 47.60 06/27/2014   LDLCALC 94 06/27/2014   TRIG 136.0 06/27/2014   CHOLHDL 4 06/27/2014  Using medications without problems:None Muscle aches: None Diet compliance:Moderate Exercise:Going to curves three times a week.  Occ walking. Other complaints:  Breast Cancer: Sees Dr. Lindi Adie. Completed tamoxifen. Breast exam was normal.  No further testing.   Left hip replacement in 2012. Dr. Maureen Ralphs.  Recovered well.  Moving better, able to sleep beter.  Right knee replacement in 05/2013  Review of Systems   Constitutional: Negative for fever and some Fatigue ongoing for a while.  HENT: Negative for ear pain.  Eyes: Negative for pain.  Respiratory: Negative for chest tightness and shortness of breath.  Cardiovascular: Negative for chest pain, palpitations and leg swelling.  Gastrointestinal: Negative for abdominal pain. No blood in stool.  Genitourinary: Negative for dysuria.  Objective:   Physical Exam  Constitutional: Vital signs are normal. She appears well-developed and well-nourished. She is cooperative. Non-toxic appearance. She does not appear ill. No distress.  HENT:  Head: Normocephalic.  Right Ear: Hearing, tympanic membrane, external ear and ear canal normal.  Left Ear: Hearing, tympanic membrane, external ear and ear canal normal.  Nose: Nose normal.  Eyes: Conjunctivae, EOM and lids are normal. Pupils are equal, round, and reactive to light. No foreign bodies found.  Neck: Trachea normal and normal range of motion. Neck supple. Carotid bruit is not present. No mass and no thyromegaly present.  Cardiovascular: Normal rate, regular rhythm, S1 normal, S2 normal, normal heart sounds and intact distal pulses. Exam reveals no gallop.  No murmur heard.  Pulmonary/Chest: Effort normal and breath sounds normal. No respiratory distress. She has no wheezes. She has no rhonchi. She has no rales.  Abdominal: Soft. Normal appearance and bowel sounds are normal. She exhibits no distension, no fluid wave, no abdominal bruit and no mass. There is no hepatosplenomegaly. There is no tenderness. There is no rebound, no guarding and no CVA tenderness. No hernia.  Genitourinary: .  Per Dr. Hulan Fray   MSK: ttp in right chest wall, prominent  right sternoclavicular joint, neg crossover test B. Lymphadenopathy:  She has no cervical adenopathy.  She has no axillary adenopathy.  Neurological: She is alert. She has normal strength. No cranial nerve deficit or sensory deficit.  Skin: Skin is  warm, dry and intact. No rash noted.  Psychiatric: Her speech is normal and behavior is normal. Judgment normal. Her mood appears not anxious. Cognition and memory are normal. She does not exhibit a depressed mood.  Assessment & Plan:   The patient's preventative maintenance and recommended screening tests for an annual wellness exam were reviewed in full today.  Brought up to date unless services declined.  Counselled on the importance of diet, exercise, and its role in overall health and mortality.  The patient's FH and SH was reviewed, including their home life, tobacco status, and drug and alcohol status.   Vaccines: Uptodate with  Flu, shingles, Td and PNA.  Given prevnar today. PAP/DVE: Per Dr. Hulan Fray. Dr Hulan Fray also as her on pessary for uterine prolapse. Has upcoming US uterus for possible enlargement Breast exam/mammogram: per Dr. Humphrey Rolls and [facility] Colonoscopy: Dr. Cristina Gong 10/2013 polyps repeat in 5 years. Nonsmoker  DEXA: last normal in 2013, repeat in 5 years.

## 2014-07-04 NOTE — Assessment & Plan Note (Signed)
If not continuing to improve.. Consider prednisone taper

## 2014-07-04 NOTE — Patient Instructions (Addendum)
Call plugged ears not improving in next few weeks prior to your trip. At that time we can do trial of prednisone.

## 2014-07-04 NOTE — Progress Notes (Signed)
Pre visit review using our clinic review tool, if applicable. No additional management support is needed unless otherwise documented below in the visit note. 

## 2014-07-07 ENCOUNTER — Telehealth: Payer: Self-pay | Admitting: Hematology and Oncology

## 2014-07-07 NOTE — Telephone Encounter (Signed)
PT CALLED TO R/S 5/12 APPTS. MOVED FROM 5/12 TO 5/9 PER PT DUE TO OUT OF TOWN. PT HAS NEW D/T.

## 2014-07-10 DIAGNOSIS — M9902 Segmental and somatic dysfunction of thoracic region: Secondary | ICD-10-CM | POA: Diagnosis not present

## 2014-07-10 DIAGNOSIS — M5033 Other cervical disc degeneration, cervicothoracic region: Secondary | ICD-10-CM | POA: Diagnosis not present

## 2014-07-10 DIAGNOSIS — M62830 Muscle spasm of back: Secondary | ICD-10-CM | POA: Diagnosis not present

## 2014-07-10 DIAGNOSIS — M9901 Segmental and somatic dysfunction of cervical region: Secondary | ICD-10-CM | POA: Diagnosis not present

## 2014-07-11 ENCOUNTER — Ambulatory Visit (INDEPENDENT_AMBULATORY_CARE_PROVIDER_SITE_OTHER): Payer: Medicare Other | Admitting: Obstetrics & Gynecology

## 2014-07-11 ENCOUNTER — Encounter: Payer: Self-pay | Admitting: Obstetrics & Gynecology

## 2014-07-11 VITALS — BP 173/92 | HR 58 | Ht 65.0 in | Wt 218.0 lb

## 2014-07-11 DIAGNOSIS — N813 Complete uterovaginal prolapse: Secondary | ICD-10-CM | POA: Diagnosis not present

## 2014-07-11 DIAGNOSIS — Z0000 Encounter for general adult medical examination without abnormal findings: Secondary | ICD-10-CM

## 2014-07-11 DIAGNOSIS — N852 Hypertrophy of uterus: Secondary | ICD-10-CM

## 2014-07-11 MED ORDER — MISOPROSTOL 200 MCG PO TABS: ORAL_TABLET | ORAL | Status: DC

## 2014-07-11 NOTE — Progress Notes (Signed)
   Subjective:    Patient ID: Darlene Lawrence, female    DOB: 1941-01-13, 74 y.o.   MRN: [id number]  HPI  74 yo MW P2 (61 and 83 yo kids who is here today for a breast and gyn exam. She had an annual exam by Dr. Jacklynn Bue but the breast and pelvic exam were not done. She is having no gyn problems. She quit using the pessary as she felt that it was more trouble than it was worth. She denies PMB.  Review of Systems She is no longer using the Estring. She is sexually active and does not feel that she needs a lubricant.    Objective:   Physical Exam Heart- rrr Lungs- CTAB Abd- obese, benign EG- moderate VVA 4th degree cystocele 3rd degree uterine prolapse Uterus feels about 14 week size (a little bigger than last visit)      Assessment & Plan:  H/o thickened endometrium on u/s with negative EMBX.  Enlarged uterus on exam  Re check u/s

## 2014-07-21 ENCOUNTER — Telehealth: Payer: Self-pay

## 2014-07-21 ENCOUNTER — Ambulatory Visit (HOSPITAL_COMMUNITY)
Admission: RE | Admit: 2014-07-21 | Discharge: 2014-07-21 | Disposition: A | Discharge status: Home / Self Care | Payer: Medicare Other | Source: Ambulatory Visit | Attending: Obstetrics & Gynecology | Admitting: Obstetrics & Gynecology

## 2014-07-21 DIAGNOSIS — D259 Leiomyoma of uterus, unspecified: Secondary | ICD-10-CM | POA: Diagnosis not present

## 2014-07-21 DIAGNOSIS — N858 Other specified noninflammatory disorders of uterus: Secondary | ICD-10-CM | POA: Diagnosis not present

## 2014-07-21 DIAGNOSIS — N950 Postmenopausal bleeding: Secondary | ICD-10-CM | POA: Insufficient documentation

## 2014-07-21 DIAGNOSIS — D251 Intramural leiomyoma of uterus: Secondary | ICD-10-CM | POA: Diagnosis not present

## 2014-07-21 DIAGNOSIS — Z0000 Encounter for general adult medical examination without abnormal findings: Secondary | ICD-10-CM

## 2014-07-21 MED ORDER — PREDNISONE 20 MG PO TABS: ORAL_TABLET | ORAL | Status: DC

## 2014-07-21 NOTE — Telephone Encounter (Signed)
Pt left v/m;pt was seen 07/04/14 and was advised if ears did not get better to cb for prednisone taper. Pt request [place]one [place].

## 2014-07-21 NOTE — Telephone Encounter (Signed)
Darlene Lawrence notified prescription has been sent to [facility] as she requested.

## 2014-07-21 NOTE — Telephone Encounter (Signed)
Rx sent 

## 2014-07-25 ENCOUNTER — Encounter: Payer: Self-pay | Admitting: Obstetrics & Gynecology

## 2014-07-28 ENCOUNTER — Ambulatory Visit (INDEPENDENT_AMBULATORY_CARE_PROVIDER_SITE_OTHER): Payer: Medicare Other | Admitting: Obstetrics & Gynecology

## 2014-07-28 DIAGNOSIS — R938 Abnormal findings on diagnostic imaging of other specified body structures: Secondary | ICD-10-CM | POA: Diagnosis not present

## 2014-07-28 DIAGNOSIS — R9389 Abnormal findings on diagnostic imaging of other specified body structures: Secondary | ICD-10-CM

## 2014-07-28 NOTE — Progress Notes (Signed)
   Subjective:    Patient ID: Darlene Lawrence, female    DOB: [birth year], 74 y.o.   MRN: [id number]  HPI  Darlene Lawrence is here to discuss her u/s results. Again a thickened endometrium is seen. She has had a EMBX and then a h/s, d&c since 2012. She is frustrated and would like to possibly have a hysterectomy instead of another biopsy.  Review of Systems     Objective:   Physical Exam        Assessment & Plan:   I have explained that she would need endometrial sampling prior to a hysterectomy. She is leaving for a trip to [place] soon and will take cytotec and have her [facility] when she returns.

## 2014-07-31 ENCOUNTER — Encounter: Payer: Self-pay | Admitting: Internal Medicine

## 2014-07-31 ENCOUNTER — Ambulatory Visit (INDEPENDENT_AMBULATORY_CARE_PROVIDER_SITE_OTHER): Payer: Medicare Other | Admitting: Internal Medicine

## 2014-07-31 VITALS — BP 120/84 | HR 58 | Temp 98.4°F | Wt 216.0 lb

## 2014-07-31 DIAGNOSIS — H6983 Other specified disorders of Eustachian tube, bilateral: Secondary | ICD-10-CM

## 2014-07-31 NOTE — Progress Notes (Signed)
Subjective:    Patient ID: Darlene Lawrence, female    DOB: 1941/01/10, 74 y.o.   MRN: [id number]  HPI  Pt presents to the clinic today with c/o bilateral ear fullness with loss of hearing. This has been going on for the last month. She denies ringing in the ears. She has been taking Zyrtec, Benadryl and Flonase without any relief. She was given a prednisone taper as well without any relief. She is concerned because she is flying to [place] in 10 days and doesn't want this issue to become worse with the flying. She reports she has seen an ENT doctor many years ago for the same, but this is when she lived down in [place].  Review of Systems      Past Medical History  Diagnosis Date  . Hyperlipidemia   . Hypertension   . breast cancer 2009    right  . Abnormal Pap smear   . Arthritis   . High cholesterol   . Meniscus tear     right  . DDD (degenerative disc disease), lumbar   . RBBB     Current Outpatient Prescriptions  Medication Sig Dispense Refill  . albuterol (PROVENTIL HFA;VENTOLIN HFA) 108 (90 BASE) MCG/ACT inhaler Inhale 2 puffs into the lungs every 6 (six) hours as needed for wheezing or shortness of breath. 1 Inhaler 0  . Aspirin (ASPIR-81 PO) Take 1 tablet by mouth.    Marland Kitchen b complex vitamins tablet Take 1 tablet by mouth daily.    . Calcium Citrate-Vitamin D (CALCIUM CITRATE + PO) Take 1 tablet by mouth daily.    . Cholecalciferol (VITAMIN D3) 2000 UNITS TABS Take 1 capsule by mouth 2 (two) times daily.    . Coenzyme Q10 (COQ10) 100 MG CAPS Take 1 capsule by mouth daily.    . diclofenac (VOLTAREN) 75 MG EC tablet Take 1 tablet (75 mg total) by mouth 2 (two) times daily. 30 tablet 0  . Magnesium 400 MG CAPS Take 1 capsule by mouth daily.    . Multiple Vitamin (MULTI VITAMIN DAILY PO) Take 1 tablet by mouth.    . Omega-3 Fatty Acids (FISH OIL) 1200 MG CAPS Take 1 capsule by mouth.    . rosuvastatin (CRESTOR) 10 MG tablet Take 10 mg by mouth. Take two times a week.    .  triamterene-hydrochlorothiazide (MAXZIDE-25) 37.5-25 MG per tablet TAKE 1/2 TABLET BY MOUTH EVERY DAY 45 tablet 0   No current facility-administered medications for this visit.    Allergies  Allergen Reactions  . Cefixime     REACTION: Rash  . Levaquin [Levofloxacin] Other (See Comments)    Shoulder Bursitis  . Penicillins     REACTION: Rash    Family History  Problem Relation Age of Onset  . Cancer Mother     breast and colon  . Hypertension Father   . Heart disease Father   . Thyroid disease Father   . Down syndrome Son   . Asthma Other     History   Social History  . Marital Status: Married    Spouse Name: N/A  . Number of Children: 2  . Years of Education: N/A   Occupational History  . advertising for store in [place]  . Smoking status: Never Smoker   . Smokeless tobacco: Never Used  . Alcohol Use: Yes     Comment: once a month  . Drug Use: No  . Sexual Activity:  Partners: Male    Patent examiner Protection: Post-menopausal   Other Topics Concern  . Not on file   Social History Narrative   Regular exercise--yes, curves 3 times a week   Diet-- fruits and veggies, water, no fast food         Constitutional: Denies fever, malaise, fatigue, headache or abrupt weight changes.  HEENT: Pt reports ear fullness and decreased hearing. Denies eye pain, eye redness, ear pain, ringing in the ears, wax buildup, runny nose, nasal congestion, bloody nose, or sore throat. Respiratory: Denies difficulty breathing, shortness of breath, cough or sputum production.   Cardiovascular: Denies chest pain, chest tightness, palpitations or swelling in the hands or feet.   Neurological: Denies dizziness, difficulty with memory, difficulty with speech or problems with balance and coordination.   No other specific complaints in a complete review of systems (except as listed in HPI above).  Objective:   Physical Exam  BP 120/84 mmHg  Pulse  58  Temp(Src) 98.4 F (36.9 C) (Oral)  Wt 216 lb (97.977 kg)  SpO2 98% Wt Readings from Last 3 Encounters:  07/31/14 216 lb (97.977 kg)  07/11/14 218 lb (98.884 kg)  07/04/14 217 lb (98.431 kg)    General: Appears her stated age, well developed, well nourished in NAD. Skin: Warm, dry and intact. No rashes, lesions or ulcerations noted. HEENT: Head: normal shape and size;  Ears: Tm's pink but intact, normal light reflex, + serous effusion bilaterally;  Neck: No adenopathy noted. Cardiovascular: Normal rate and rhythm. S1,S2 noted.  No murmur, rubs or gallops noted.  Pulmonary/Chest: Normal effort and positive vesicular breath sounds. No respiratory distress. No wheezes, rales or ronchi noted.  Neurological: Alert and oriented.   BMET    Component Value Date/Time   NA 139 06/27/2014 0836   NA 142 01/13/2014 0942   K 4.0 06/27/2014 0836   K 4.1 01/13/2014 0942   CL 105 06/27/2014 0836   CL 107 10/14/2012 0951   CO2 29 06/27/2014 0836   CO2 28 01/13/2014 0942   GLUCOSE 96 06/27/2014 0836   GLUCOSE 98 01/13/2014 0942   GLUCOSE 92 10/14/2012 0951   BUN 32* 06/27/2014 0836   BUN 27.3* 01/13/2014 0942   CREATININE 1.02 06/27/2014 0836   CREATININE 1.1 01/13/2014 0942   CALCIUM 9.1 06/27/2014 0836   CALCIUM 9.5 01/13/2014 0942   GFRNONAA 68* 06/08/2013 0428   GFRAA 79* 06/08/2013 0428    Lipid Panel     Component Value Date/Time   CHOL 169 06/27/2014 0836   TRIG 136.0 06/27/2014 0836   HDL 47.60 06/27/2014 0836   CHOLHDL 4 06/27/2014 0836   VLDL 27.2 06/27/2014 0836   LDLCALC 94 06/27/2014 0836    CBC    Component Value Date/Time   WBC 4.4 01/13/2014 0942   WBC 9.2 06/08/2013 0428   RBC 3.95 01/13/2014 0942   RBC 2.89* 06/08/2013 0428   HGB 12.9 01/13/2014 0942   HGB 9.8* 06/08/2013 0428   HCT 39.7 01/13/2014 0942   HCT 29.3* 06/08/2013 0428   PLT 154 01/13/2014 0942   PLT 139* 06/08/2013 0428   MCV 100.6 01/13/2014 0942   MCV 101.4* 06/08/2013 0428   MCH  32.8 01/13/2014 0942   MCH 33.9 06/08/2013 0428   MCHC 32.6 01/13/2014 0942   MCHC 33.4 06/08/2013 0428   RDW 13.6 01/13/2014 0942   RDW 13.5 06/08/2013 0428   LYMPHSABS 2.0 01/13/2014 0942   LYMPHSABS 2.3 05/13/2013 0830   MONOABS 0.4  01/13/2014 0942   MONOABS 0.5 05/13/2013 0830   EOSABS 0.2 01/13/2014 0942   EOSABS 0.3 05/13/2013 0830   BASOSABS 0.0 01/13/2014 0942   BASOSABS 0.0 05/13/2013 0830    Hgb A1C No results found for: HGBA1C       Assessment & Plan:   ETD, bilateral:  No relief with OTC antihistamine and intranasal steroid No relief with Prednisone Taper Will refer to ENT for further evaluation Advised her to continue Zyrtec and Flonase at this time  RTC as needed or follow up with PCP if symptoms do not improve

## 2014-07-31 NOTE — Progress Notes (Signed)
Subjective:    Patient ID: Darlene Lawrence, female    DOB: 1940-12-16, 74 y.o.   MRN: [id number]  HPI Darlene Lawrence is a 74 year old female who presents today with chief complaint of bilateral ear fullness with loss of hearing.  Symptoms began at the beginning of February.  She has been taking Flonase and Zyrtec daily without relief and was prescribed a prednisone taper that she finished 2 days ago.  She still feels like her ears are clogged.  No pain or ringing in her ears.    Review of Systems  Constitutional: Negative for fever and fatigue.  HENT: Negative for congestion, ear discharge, ear pain, postnasal drip and sore throat.   Respiratory: Negative for cough and shortness of breath.   Cardiovascular: Negative for chest pain, palpitations and leg swelling.  Genitourinary: Negative.   Skin: Negative for color change, pallor, rash and wound.  Neurological: Negative for weakness, light-headedness and headaches.   Past Medical History  Diagnosis Date  . Hyperlipidemia   . Hypertension   . breast cancer 2009    right  . Abnormal Pap smear   . Arthritis   . High cholesterol   . Meniscus tear     right  . DDD (degenerative disc disease), lumbar   . RBBB    Family History  Problem Relation Age of Onset  . Cancer Mother     breast and colon  . Hypertension Father   . Heart disease Father   . Thyroid disease Father   . Down syndrome Son   . Asthma Other    Current Outpatient Prescriptions on File Prior to Visit  Medication Sig Dispense Refill  . albuterol (PROVENTIL HFA;VENTOLIN HFA) 108 (90 BASE) MCG/ACT inhaler Inhale 2 puffs into the lungs every 6 (six) hours as needed for wheezing or shortness of breath. 1 Inhaler 0  . Aspirin (ASPIR-81 PO) Take 1 tablet by mouth.    Marland Kitchen b complex vitamins tablet Take 1 tablet by mouth daily.    . Calcium Citrate-Vitamin D (CALCIUM CITRATE + PO) Take 1 tablet by mouth daily.    . Cholecalciferol (VITAMIN D3) 2000 UNITS TABS Take 1 capsule  by mouth 2 (two) times daily.    . Coenzyme Q10 (COQ10) 100 MG CAPS Take 1 capsule by mouth daily.    . diclofenac (VOLTAREN) 75 MG EC tablet Take 1 tablet (75 mg total) by mouth 2 (two) times daily. 30 tablet 0  . Magnesium 400 MG CAPS Take 1 capsule by mouth daily.    . Multiple Vitamin (MULTI VITAMIN DAILY PO) Take 1 tablet by mouth.    . Omega-3 Fatty Acids (FISH OIL) 1200 MG CAPS Take 1 capsule by mouth.    . rosuvastatin (CRESTOR) 10 MG tablet Take 10 mg by mouth. Take two times a week.    . triamterene-hydrochlorothiazide (MAXZIDE-25) 37.5-25 MG per tablet TAKE 1/2 TABLET BY MOUTH EVERY DAY 45 tablet 0   No current facility-administered medications on file prior to visit.       Objective:   Physical Exam  Constitutional: She is oriented to person, place, and time. She appears well-developed and well-nourished.  HENT:  Head: Normocephalic and atraumatic.  Fluid present behind bilateral TM's.  Small amount of cerumen in right canal.  TM's are non bulging, non erythematous.    Neck: Normal range of motion. Neck supple.  Cardiovascular: Normal rate, regular rhythm and normal heart sounds.   Pulmonary/Chest: Effort normal and breath sounds normal.  Musculoskeletal: Normal range of motion.  Lymphadenopathy:    She has no cervical adenopathy.  Neurological: She is alert and oriented to person, place, and time.  Skin: Skin is warm and dry.  Psychiatric: She has a normal mood and affect.    BP 120/84 mmHg  Pulse 58  Temp(Src) 98.4 F (36.9 C) (Oral)  Wt 216 lb (97.977 kg)  SpO2 98%       Assessment & Plan:  1. Eustachian tube dysfunction Refer to ENT as patient has completed prednisone taper.  Can use debrox to clear wax as directed.  Follow up as needed.

## 2014-07-31 NOTE — Patient Instructions (Signed)
Barotitis Media Barotitis media is inflammation of your middle ear. This occurs when the auditory tube (eustachian tube) leading from the back of your nose (nasopharynx) to your eardrum is blocked. This blockage may result from a cold, environmental allergies, or an upper respiratory infection. Unresolved barotitis media may lead to damage or hearing loss (barotrauma), which may become permanent. HOME CARE INSTRUCTIONS   Use medicines as recommended by your health care provider. Over-the-counter medicines will help unblock the canal and can help during times of air travel.  Do not put anything into your ears to clean or unplug them. Eardrops will not be helpful.  Do not swim, dive, or fly until your health care provider says it is all right to do so. If these activities are necessary, chewing gum with frequent, forceful swallowing may help. It is also helpful to hold your nose and gently blow to pop your ears for equalizing pressure changes. This forces air into the eustachian tube.  Only take over-the-counter or prescription medicines for pain, discomfort, or fever as directed by your health care provider.  A decongestant may be helpful in decongesting the middle ear and make pressure equalization easier. SEEK MEDICAL CARE IF:  You experience a serious form of dizziness in which you feel as if the room is spinning and you feel nauseated (vertigo).  Your symptoms only involve one ear. SEEK IMMEDIATE MEDICAL CARE IF:   You develop a severe headache, dizziness, or severe ear pain.  You have bloody or pus-like drainage from your ears.  You develop a fever.  Your problems do not improve or become worse. MAKE SURE YOU:   Understand these instructions.  Will watch your condition.  Will get help right away if you are not doing well or get worse. Document Released: 04/25/2000 Document Revised: 02/16/2013 Document Reviewed: 11/23/2012 ExitCare Patient Information 2015 [employer]. This  information is not intended to replace advice given to you by your health care provider. Make sure you discuss any questions you have with your health care provider.  

## 2014-07-31 NOTE — Progress Notes (Signed)
Pre visit review using our clinic review tool, if applicable. No additional management support is needed unless otherwise documented below in the visit note. 

## 2014-08-01 DIAGNOSIS — H68013 Acute Eustachian salpingitis, bilateral: Secondary | ICD-10-CM | POA: Diagnosis not present

## 2014-08-07 DIAGNOSIS — M5033 Other cervical disc degeneration, cervicothoracic region: Secondary | ICD-10-CM | POA: Diagnosis not present

## 2014-08-07 DIAGNOSIS — M9902 Segmental and somatic dysfunction of thoracic region: Secondary | ICD-10-CM | POA: Diagnosis not present

## 2014-08-07 DIAGNOSIS — M9901 Segmental and somatic dysfunction of cervical region: Secondary | ICD-10-CM | POA: Diagnosis not present

## 2014-08-07 DIAGNOSIS — M62830 Muscle spasm of back: Secondary | ICD-10-CM | POA: Diagnosis not present

## 2014-08-21 ENCOUNTER — Ambulatory Visit
Admission: RE | Admit: 2014-08-21 | Discharge: 2014-08-21 | Disposition: A | Discharge status: Home / Self Care | Payer: Medicare Other | Source: Ambulatory Visit | Attending: Hematology and Oncology | Admitting: Hematology and Oncology

## 2014-08-21 ENCOUNTER — Other Ambulatory Visit: Payer: Medicare Other

## 2014-08-21 ENCOUNTER — Other Ambulatory Visit: Payer: Self-pay | Admitting: Hematology and Oncology

## 2014-08-21 DIAGNOSIS — C50919 Malignant neoplasm of unspecified site of unspecified female breast: Secondary | ICD-10-CM

## 2014-08-21 DIAGNOSIS — Z853 Personal history of malignant neoplasm of breast: Secondary | ICD-10-CM | POA: Diagnosis not present

## 2014-08-21 DIAGNOSIS — M85852 Other specified disorders of bone density and structure, left thigh: Secondary | ICD-10-CM | POA: Diagnosis not present

## 2014-08-21 DIAGNOSIS — E2839 Other primary ovarian failure: Secondary | ICD-10-CM

## 2014-08-21 DIAGNOSIS — N6459 Other signs and symptoms in breast: Secondary | ICD-10-CM | POA: Diagnosis not present

## 2014-08-25 ENCOUNTER — Ambulatory Visit (INDEPENDENT_AMBULATORY_CARE_PROVIDER_SITE_OTHER): Payer: Medicare Other | Admitting: Obstetrics & Gynecology

## 2014-08-25 ENCOUNTER — Encounter: Payer: Self-pay | Admitting: Obstetrics & Gynecology

## 2014-08-25 VITALS — BP 118/78 | HR 58 | Wt 219.0 lb

## 2014-08-25 DIAGNOSIS — N958 Other specified menopausal and perimenopausal disorders: Secondary | ICD-10-CM | POA: Diagnosis not present

## 2014-08-25 DIAGNOSIS — R9389 Abnormal findings on diagnostic imaging of other specified body structures: Secondary | ICD-10-CM

## 2014-08-25 DIAGNOSIS — R938 Abnormal findings on diagnostic imaging of other specified body structures: Secondary | ICD-10-CM | POA: Diagnosis not present

## 2014-08-25 NOTE — Progress Notes (Signed)
   Subjective:    Patient ID: Darlene Lawrence, female    DOB: 09-Dec-1940, 74 y.o.   MRN: [id number]  HPI  Darlene Lawrence has returned from her [place] trip and is ready for her EMBX. This is being done due to the ultrasound finding of a thickened endometrium. She has had a EMBX as well as a d&c/h/s in the past. She is frustrated with this and would very much like to have a hysterectomy. Her other reason for desiring a hysterectomy is her severe pelvic organ prolapse. She has a 4th degree cystocele and 2nd degree uterine prolapse. She took cytotec last night   Review of Systems     Objective:   Physical Exam   UPT negative, consent signed, time out done Cervix prepped with betadine and grasped with a single tooth tenaculum Uterus sounded to 9 cm Pipelle used for 2 passes with a moderate amount of tissue obtained. A gritty sensation was appreciated. She tolerated the procedure well.        Assessment & Plan:  Severe POP- I will refer her to Darlene Lawrence RTC prn

## 2014-08-30 ENCOUNTER — Telehealth: Payer: Self-pay

## 2014-08-30 NOTE — Telephone Encounter (Signed)
Bone density results dtd 08/21/14 from [facility].  Reviewed by Dr. Lindi Adie.  Sent to scan.

## 2014-08-31 ENCOUNTER — Telehealth: Payer: Self-pay | Admitting: *Deleted

## 2014-08-31 NOTE — Telephone Encounter (Signed)
Notified patient of normal results and Darlene Lawrence will call her back regarding the referral Dr. Hulan Fray wanted for the GYN specialist.

## 2014-09-04 DIAGNOSIS — M9902 Segmental and somatic dysfunction of thoracic region: Secondary | ICD-10-CM | POA: Diagnosis not present

## 2014-09-04 DIAGNOSIS — M62830 Muscle spasm of back: Secondary | ICD-10-CM | POA: Diagnosis not present

## 2014-09-04 DIAGNOSIS — M5033 Other cervical disc degeneration, cervicothoracic region: Secondary | ICD-10-CM | POA: Diagnosis not present

## 2014-09-04 DIAGNOSIS — M9901 Segmental and somatic dysfunction of cervical region: Secondary | ICD-10-CM | POA: Diagnosis not present

## 2014-09-08 ENCOUNTER — Other Ambulatory Visit: Payer: Self-pay | Admitting: Family Medicine

## 2014-09-18 ENCOUNTER — Ambulatory Visit (HOSPITAL_BASED_OUTPATIENT_CLINIC_OR_DEPARTMENT_OTHER): Payer: Medicare Other | Admitting: Hematology and Oncology

## 2014-09-18 ENCOUNTER — Other Ambulatory Visit: Payer: Medicare Other

## 2014-09-18 VITALS — BP 151/81 | HR 64 | Temp 97.5°F | Resp 18 | Ht 65.0 in | Wt 215.8 lb

## 2014-09-18 DIAGNOSIS — C50911 Malignant neoplasm of unspecified site of right female breast: Secondary | ICD-10-CM

## 2014-09-18 DIAGNOSIS — M8580 Other specified disorders of bone density and structure, unspecified site: Secondary | ICD-10-CM | POA: Diagnosis not present

## 2014-09-18 DIAGNOSIS — Z853 Personal history of malignant neoplasm of breast: Secondary | ICD-10-CM

## 2014-09-18 DIAGNOSIS — C50919 Malignant neoplasm of unspecified site of unspecified female breast: Secondary | ICD-10-CM

## 2014-09-18 LAB — COMPREHENSIVE METABOLIC PANEL (CC13)
ALT: 37 U/L (ref 0–55)
AST: 27 U/L (ref 5–34)
Albumin: 3.6 g/dL (ref 3.5–5.0)
Alkaline Phosphatase: 72 U/L (ref 40–150)
Anion Gap: 12 mEq/L — ABNORMAL HIGH (ref 3–11)
BUN: 26.5 mg/dL — ABNORMAL HIGH (ref 7.0–26.0)
CO2: 25 meq/L (ref 22–29)
CREATININE: 1.1 mg/dL (ref 0.6–1.1)
Calcium: 9.7 mg/dL (ref 8.4–10.4)
Chloride: 106 mEq/L (ref 98–109)
EGFR: 50 mL/min/{1.73_m2} — ABNORMAL LOW (ref >90)
Glucose: 108 mg/dl (ref 70–140)
POTASSIUM: 3.9 meq/L (ref 3.5–5.1)
Sodium: 143 mEq/L (ref 136–145)
Total Bilirubin: 0.39 mg/dL (ref 0.20–1.20)
Total Protein: 7.3 g/dL (ref 6.4–8.3)

## 2014-09-18 LAB — CBC WITH DIFFERENTIAL/PLATELET
BASO%: 0.9 % (ref 0.0–2.0)
Basophils Absolute: 0 10*3/uL (ref 0.0–0.1)
EOS%: 6.8 % (ref 0.0–7.0)
Eosinophils Absolute: 0.3 10*3/uL (ref 0.0–0.5)
HCT: 39.6 % (ref 34.8–46.6)
HEMOGLOBIN: 13.3 g/dL (ref 11.6–15.9)
LYMPH#: 2.2 10*3/uL (ref 0.9–3.3)
LYMPH%: 47.6 % (ref 14.0–49.7)
MCH: 34.1 pg — AB (ref 25.1–34.0)
MCHC: 33.6 g/dL (ref 31.5–36.0)
MCV: 101.5 fL — ABNORMAL HIGH (ref 79.5–101.0)
MONO#: 0.5 10*3/uL (ref 0.1–0.9)
MONO%: 10.7 % (ref 0.0–14.0)
NEUT%: 34 % — AB (ref 38.4–76.8)
NEUTROS ABS: 1.6 10*3/uL (ref 1.5–6.5)
Platelets: 148 10*3/uL (ref 145–400)
RBC: 3.9 10*6/uL (ref 3.70–5.45)
RDW: 13.8 % (ref 11.2–14.5)
WBC: 4.7 10*3/uL (ref 3.9–10.3)

## 2014-09-18 NOTE — Progress Notes (Signed)
Patient Care Team: Jinny Sanders, MD as PCP - General  DIAGNOSIS: ADENOCARCINOMA, BREAST   Staging form: Breast, AJCC 6th Edition     Clinical: No stage assigned - Unsigned     Pathologic: T1b, N0 - Unsigned   SUMMARY OF ONCOLOGIC HISTORY:   ADENOCARCINOMA, BREAST   07/14/2007 Mammogram Mass in the right breast: Ultrasound showed a spiculated mass 10:00 position 7 x 7 x 8 mm   08/18/2007 Initial Diagnosis Invasive mammary cancer   08/26/2007 Breast MRI Right breast upper outer quadrant 9 x 8 x 8 mm mass without lymphadenopathy   10/06/2007 Surgery Right breast lumpectomy with SLN biopsy, grade 1 IDC 1 cm with associated DCIS no MVI EOMI the percent, PR 58%, Ki-67 90%, HER-2 negative   11/24/2007 - 12/24/2007 Radiation Therapy Adjuvant radiation therapy to the right breast   01/13/2008 - 01/13/2013 Anti-estrogen oral therapy Tamoxifen 20 mg daily x5 years    CHIEF COMPLIANT:  Follow-up of breast cancer  INTERVAL HISTORY: Darlene Lawrence is a  74 year old lady with above-mentioned history of right-sided breast cancer treated with lumpectomy radiation in 5 years of tamoxifen that was completed in September 2014. She is here for a  Follow-up of recently conducted tests which included bone density and mammogram. She is doing extremely well without any problems or concerns. She is fine to go to [place] for 3 weeks. So she is very busy packing for that. Denies any lumps or nodules in the breasts.  REVIEW OF SYSTEMS:   Constitutional: Denies fevers, chills or abnormal weight loss Eyes: Denies blurriness of vision Ears, nose, mouth, throat, and face: Denies mucositis or sore throat Respiratory: Denies cough, dyspnea or wheezes Cardiovascular: Denies palpitation, chest discomfort or lower extremity swelling Gastrointestinal:  Denies nausea, heartburn or change in bowel habits Skin: Denies abnormal skin rashes Lymphatics: Denies new lymphadenopathy or easy bruising Neurological:Denies  numbness, tingling or new weaknesses Behavioral/Psych: Mood is stable, no new changes  Breast:  denies any pain or lumps or nodules in either breasts All other systems were reviewed with the patient and are negative.  I have reviewed the past medical history, past surgical history, social history and family history with the patient and they are unchanged from previous note.  ALLERGIES:  is allergic to cefixime; levaquin; and penicillins.  MEDICATIONS:  Current Outpatient Prescriptions  Medication Sig Dispense Refill  . Aspirin (ASPIR-81 PO) Take 1 tablet by mouth.    Marland Kitchen b complex vitamins tablet Take 1 tablet by mouth daily.    . Calcium Citrate-Vitamin D (CALCIUM CITRATE + PO) Take 1 tablet by mouth daily.    . Cholecalciferol (VITAMIN D3) 2000 UNITS TABS Take 1 capsule by mouth 2 (two) times daily.    . Coenzyme Q10 (COQ10) 100 MG CAPS Take 1 capsule by mouth daily.    . Magnesium 400 MG CAPS Take 1 capsule by mouth daily.    . Multiple Vitamin (MULTI VITAMIN DAILY PO) Take 1 tablet by mouth.    . Omega-3 Fatty Acids (FISH OIL) 1200 MG CAPS Take 1 capsule by mouth.    . rosuvastatin (CRESTOR) 10 MG tablet Take 10 mg by mouth. Take two times a week.    . triamterene-hydrochlorothiazide (MAXZIDE-25) 37.5-25 MG per tablet TAKE 1/2 TABLET BY MOUTH EVERY DAY 45 tablet 1  . albuterol (PROVENTIL HFA;VENTOLIN HFA) 108 (90 BASE) MCG/ACT inhaler Inhale 2 puffs into the lungs every 6 (six) hours as needed for wheezing or shortness of breath. (Patient  not taking: Reported on 09/18/2014) 1 Inhaler 0  . diclofenac (VOLTAREN) 75 MG EC tablet Take 1 tablet (75 mg total) by mouth 2 (two) times daily. (Patient not taking: Reported on 09/18/2014) 30 tablet 0   No current facility-administered medications for this visit.    PHYSICAL EXAMINATION: ECOG PERFORMANCE STATUS: 0 - Asymptomatic  Filed Vitals:   09/18/14 1135  BP: 151/81  Pulse: 64  Temp: 97.5 F (36.4 C)  Resp: 18   Filed Weights   09/18/14  1135  Weight: 215 lb 12.8 oz (97.886 kg)    GENERAL:alert, no distress and comfortable SKIN: skin color, texture, turgor are normal, no rashes or significant lesions EYES: normal, Conjunctiva are pink and non-injected, sclera clear OROPHARYNX:no exudate, no erythema and lips, buccal mucosa, and tongue normal  NECK: supple, thyroid normal size, non-tender, without nodularity LYMPH:  no palpable lymphadenopathy in the cervical, axillary or inguinal LUNGS: clear to auscultation and percussion with normal breathing effort HEART: regular rate & rhythm and no murmurs and no lower extremity edema ABDOMEN:abdomen soft, non-tender and normal bowel sounds Musculoskeletal:no cyanosis of digits and no clubbing  NEURO: alert & oriented x 3 with fluent speech, no focal motor/sensory deficits BREAST: No palpable masses or nodules in either right or left breasts. No palpable axillary supraclavicular or infraclavicular adenopathy no breast tenderness or nipple discharge. (exam performed in the presence of a chaperone)  LABORATORY DATA:  I have reviewed the data as listed   Chemistry      Component Value Date/Time   NA 143 09/18/2014 1027   NA 139 06/27/2014 0836   K 3.9 09/18/2014 1027   K 4.0 06/27/2014 0836   CL 105 06/27/2014 0836   CL 107 10/14/2012 0951   CO2 25 09/18/2014 1027   CO2 29 06/27/2014 0836   BUN 26.5* 09/18/2014 1027   BUN 32* 06/27/2014 0836   CREATININE 1.1 09/18/2014 1027   CREATININE 1.02 06/27/2014 0836      Component Value Date/Time   CALCIUM 9.7 09/18/2014 1027   CALCIUM 9.1 06/27/2014 0836   ALKPHOS 72 09/18/2014 1027   ALKPHOS 65 06/27/2014 0836   AST 27 09/18/2014 1027   AST 21 06/27/2014 0836   ALT 37 09/18/2014 1027   ALT 25 06/27/2014 0836   BILITOT 0.39 09/18/2014 1027   BILITOT 0.5 06/27/2014 0836       Lab Results  Component Value Date   WBC 4.7 09/18/2014   HGB 13.3 09/18/2014   HCT 39.6 09/18/2014   MCV 101.5* 09/18/2014   PLT 148 09/18/2014    NEUTROABS 1.6 09/18/2014     RADIOGRAPHIC STUDIES: I have personally reviewed the radiology reports and agreed with their findings.  mammogram April 2016 : Normal  Bone density April 2016: Osteopenia T score -1.5  Left hip and +0.3 in the spine  ASSESSMENT & PLAN:  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 She reports no new problems or concerns. I reviewed the mammogram which was normal in April 2015. Today's breast exam was also normal. I recommended continued annual follow up with mammograms and followups every April.  Breast Cancer Surveillance: 1. Breast exam   09/18/2014:  Scar tissue from previous surgery in the right breast. 2. Mammogram  08/21/2014 No abnormalities. Postsurgical changes. Breast Density Category  B. I recommended that she get 3-D mammograms for surveillance. Discussed the differences  between different breast density categories.  Osteopenia: I reviewed the bone density report which showed a T score of -1.5. This is osteopenia recommended calcium and vitamin D.  return to clinic with no  Survivorship NP follow-up in one year     Orders Placed This Encounter  Procedures  . Comprehensive metabolic panel  . CBC with Differential/Platelet   The patient has a good understanding of the overall plan. she agrees with it. she will call with any problems that may develop before the next visit here.   Rulon Eisenmenger, MD

## 2014-09-21 ENCOUNTER — Other Ambulatory Visit: Payer: Medicare Other

## 2014-09-21 ENCOUNTER — Ambulatory Visit: Payer: Medicare Other | Admitting: Hematology and Oncology

## 2014-10-10 DIAGNOSIS — M9901 Segmental and somatic dysfunction of cervical region: Secondary | ICD-10-CM | POA: Diagnosis not present

## 2014-10-10 DIAGNOSIS — M9902 Segmental and somatic dysfunction of thoracic region: Secondary | ICD-10-CM | POA: Diagnosis not present

## 2014-10-10 DIAGNOSIS — M62830 Muscle spasm of back: Secondary | ICD-10-CM | POA: Diagnosis not present

## 2014-10-10 DIAGNOSIS — M5033 Other cervical disc degeneration, cervicothoracic region: Secondary | ICD-10-CM | POA: Diagnosis not present

## 2014-10-11 DIAGNOSIS — R8290 Unspecified abnormal findings in urine: Secondary | ICD-10-CM | POA: Diagnosis not present

## 2014-10-11 DIAGNOSIS — N819 Female genital prolapse, unspecified: Secondary | ICD-10-CM | POA: Diagnosis not present

## 2014-10-11 DIAGNOSIS — N3281 Overactive bladder: Secondary | ICD-10-CM | POA: Diagnosis not present

## 2014-10-11 DIAGNOSIS — N8110 Cystocele, unspecified: Secondary | ICD-10-CM | POA: Diagnosis not present

## 2014-10-12 DIAGNOSIS — H9193 Unspecified hearing loss, bilateral: Secondary | ICD-10-CM | POA: Diagnosis not present

## 2014-10-12 DIAGNOSIS — H68013 Acute Eustachian salpingitis, bilateral: Secondary | ICD-10-CM | POA: Diagnosis not present

## 2014-10-30 DIAGNOSIS — M9902 Segmental and somatic dysfunction of thoracic region: Secondary | ICD-10-CM | POA: Diagnosis not present

## 2014-10-30 DIAGNOSIS — M5033 Other cervical disc degeneration, cervicothoracic region: Secondary | ICD-10-CM | POA: Diagnosis not present

## 2014-10-30 DIAGNOSIS — M9901 Segmental and somatic dysfunction of cervical region: Secondary | ICD-10-CM | POA: Diagnosis not present

## 2014-10-30 DIAGNOSIS — M62830 Muscle spasm of back: Secondary | ICD-10-CM | POA: Diagnosis not present

## 2014-11-10 DIAGNOSIS — N819 Female genital prolapse, unspecified: Secondary | ICD-10-CM | POA: Diagnosis not present

## 2014-11-10 DIAGNOSIS — I4510 Unspecified right bundle-branch block: Secondary | ICD-10-CM | POA: Diagnosis not present

## 2014-11-10 DIAGNOSIS — M1990 Unspecified osteoarthritis, unspecified site: Secondary | ICD-10-CM | POA: Insufficient documentation

## 2014-11-10 DIAGNOSIS — Z853 Personal history of malignant neoplasm of breast: Secondary | ICD-10-CM | POA: Diagnosis not present

## 2014-11-10 DIAGNOSIS — E785 Hyperlipidemia, unspecified: Secondary | ICD-10-CM | POA: Diagnosis not present

## 2014-11-10 DIAGNOSIS — I10 Essential (primary) hypertension: Secondary | ICD-10-CM | POA: Diagnosis not present

## 2014-11-10 DIAGNOSIS — Z01818 Encounter for other preprocedural examination: Secondary | ICD-10-CM | POA: Diagnosis not present

## 2014-11-10 DIAGNOSIS — R001 Bradycardia, unspecified: Secondary | ICD-10-CM | POA: Diagnosis not present

## 2014-11-10 DIAGNOSIS — N8110 Cystocele, unspecified: Secondary | ICD-10-CM | POA: Diagnosis not present

## 2014-11-28 DIAGNOSIS — M9901 Segmental and somatic dysfunction of cervical region: Secondary | ICD-10-CM | POA: Diagnosis not present

## 2014-11-28 DIAGNOSIS — M5033 Other cervical disc degeneration, cervicothoracic region: Secondary | ICD-10-CM | POA: Diagnosis not present

## 2014-11-28 DIAGNOSIS — M9902 Segmental and somatic dysfunction of thoracic region: Secondary | ICD-10-CM | POA: Diagnosis not present

## 2014-11-28 DIAGNOSIS — M62830 Muscle spasm of back: Secondary | ICD-10-CM | POA: Diagnosis not present

## 2014-12-18 DIAGNOSIS — H2513 Age-related nuclear cataract, bilateral: Secondary | ICD-10-CM | POA: Diagnosis not present

## 2014-12-26 DIAGNOSIS — M9902 Segmental and somatic dysfunction of thoracic region: Secondary | ICD-10-CM | POA: Diagnosis not present

## 2014-12-26 DIAGNOSIS — M9901 Segmental and somatic dysfunction of cervical region: Secondary | ICD-10-CM | POA: Diagnosis not present

## 2014-12-26 DIAGNOSIS — M5033 Other cervical disc degeneration, cervicothoracic region: Secondary | ICD-10-CM | POA: Diagnosis not present

## 2014-12-26 DIAGNOSIS — M62830 Muscle spasm of back: Secondary | ICD-10-CM | POA: Diagnosis not present

## 2015-01-22 DIAGNOSIS — M9902 Segmental and somatic dysfunction of thoracic region: Secondary | ICD-10-CM | POA: Diagnosis not present

## 2015-01-22 DIAGNOSIS — M62830 Muscle spasm of back: Secondary | ICD-10-CM | POA: Diagnosis not present

## 2015-01-22 DIAGNOSIS — M5033 Other cervical disc degeneration, cervicothoracic region: Secondary | ICD-10-CM | POA: Diagnosis not present

## 2015-01-22 DIAGNOSIS — M9901 Segmental and somatic dysfunction of cervical region: Secondary | ICD-10-CM | POA: Diagnosis not present

## 2015-02-19 DIAGNOSIS — M62830 Muscle spasm of back: Secondary | ICD-10-CM | POA: Diagnosis not present

## 2015-02-19 DIAGNOSIS — M9902 Segmental and somatic dysfunction of thoracic region: Secondary | ICD-10-CM | POA: Diagnosis not present

## 2015-02-19 DIAGNOSIS — M5033 Other cervical disc degeneration, cervicothoracic region: Secondary | ICD-10-CM | POA: Diagnosis not present

## 2015-02-19 DIAGNOSIS — M9901 Segmental and somatic dysfunction of cervical region: Secondary | ICD-10-CM | POA: Diagnosis not present

## 2015-03-26 DIAGNOSIS — M5033 Other cervical disc degeneration, cervicothoracic region: Secondary | ICD-10-CM | POA: Diagnosis not present

## 2015-03-26 DIAGNOSIS — M9901 Segmental and somatic dysfunction of cervical region: Secondary | ICD-10-CM | POA: Diagnosis not present

## 2015-03-26 DIAGNOSIS — M9902 Segmental and somatic dysfunction of thoracic region: Secondary | ICD-10-CM | POA: Diagnosis not present

## 2015-03-26 DIAGNOSIS — Z23 Encounter for immunization: Secondary | ICD-10-CM | POA: Diagnosis not present

## 2015-03-26 DIAGNOSIS — M62830 Muscle spasm of back: Secondary | ICD-10-CM | POA: Diagnosis not present

## 2015-03-30 ENCOUNTER — Other Ambulatory Visit: Payer: Self-pay | Admitting: Family Medicine

## 2015-04-16 DIAGNOSIS — M9901 Segmental and somatic dysfunction of cervical region: Secondary | ICD-10-CM | POA: Diagnosis not present

## 2015-04-16 DIAGNOSIS — M62830 Muscle spasm of back: Secondary | ICD-10-CM | POA: Diagnosis not present

## 2015-04-16 DIAGNOSIS — M5033 Other cervical disc degeneration, cervicothoracic region: Secondary | ICD-10-CM | POA: Diagnosis not present

## 2015-04-16 DIAGNOSIS — M9902 Segmental and somatic dysfunction of thoracic region: Secondary | ICD-10-CM | POA: Diagnosis not present

## 2015-05-28 ENCOUNTER — Telehealth: Payer: Self-pay | Admitting: *Deleted

## 2015-05-28 DIAGNOSIS — M9902 Segmental and somatic dysfunction of thoracic region: Secondary | ICD-10-CM | POA: Diagnosis not present

## 2015-05-28 DIAGNOSIS — M9901 Segmental and somatic dysfunction of cervical region: Secondary | ICD-10-CM | POA: Diagnosis not present

## 2015-05-28 DIAGNOSIS — M5033 Other cervical disc degeneration, cervicothoracic region: Secondary | ICD-10-CM | POA: Diagnosis not present

## 2015-05-28 DIAGNOSIS — M62830 Muscle spasm of back: Secondary | ICD-10-CM | POA: Diagnosis not present

## 2015-05-28 NOTE — Telephone Encounter (Signed)
Patent would like a disability parking form completed. Patient has had a hip and knee replacement which makes it difficult for her to walk a long distance.  Please give signed form to Rollene Fare to have her deliver it to the patient. Form is on your desk.

## 2015-05-29 NOTE — Telephone Encounter (Signed)
Completed form to patient.

## 2015-06-25 DIAGNOSIS — M62830 Muscle spasm of back: Secondary | ICD-10-CM | POA: Diagnosis not present

## 2015-06-25 DIAGNOSIS — M5033 Other cervical disc degeneration, cervicothoracic region: Secondary | ICD-10-CM | POA: Diagnosis not present

## 2015-06-25 DIAGNOSIS — M9901 Segmental and somatic dysfunction of cervical region: Secondary | ICD-10-CM | POA: Diagnosis not present

## 2015-06-25 DIAGNOSIS — M9902 Segmental and somatic dysfunction of thoracic region: Secondary | ICD-10-CM | POA: Diagnosis not present

## 2015-06-26 ENCOUNTER — Telehealth: Payer: Self-pay | Admitting: Family Medicine

## 2015-06-26 NOTE — Telephone Encounter (Signed)
Please call and schedule Medicare Wellness with fasting labs prior for Dr. Bedsole. 

## 2015-06-28 NOTE — Telephone Encounter (Signed)
Labs 6/1 cpx 6/6  Pt aware Please close

## 2015-07-23 ENCOUNTER — Other Ambulatory Visit: Payer: Self-pay

## 2015-07-23 DIAGNOSIS — Z1231 Encounter for screening mammogram for malignant neoplasm of breast: Secondary | ICD-10-CM

## 2015-07-24 DIAGNOSIS — M62830 Muscle spasm of back: Secondary | ICD-10-CM | POA: Diagnosis not present

## 2015-07-24 DIAGNOSIS — M5033 Other cervical disc degeneration, cervicothoracic region: Secondary | ICD-10-CM | POA: Diagnosis not present

## 2015-07-24 DIAGNOSIS — M9901 Segmental and somatic dysfunction of cervical region: Secondary | ICD-10-CM | POA: Diagnosis not present

## 2015-07-24 DIAGNOSIS — M9902 Segmental and somatic dysfunction of thoracic region: Secondary | ICD-10-CM | POA: Diagnosis not present

## 2015-08-21 DIAGNOSIS — M62830 Muscle spasm of back: Secondary | ICD-10-CM | POA: Diagnosis not present

## 2015-08-21 DIAGNOSIS — M9901 Segmental and somatic dysfunction of cervical region: Secondary | ICD-10-CM | POA: Diagnosis not present

## 2015-08-21 DIAGNOSIS — M5033 Other cervical disc degeneration, cervicothoracic region: Secondary | ICD-10-CM | POA: Diagnosis not present

## 2015-08-21 DIAGNOSIS — M9902 Segmental and somatic dysfunction of thoracic region: Secondary | ICD-10-CM | POA: Diagnosis not present

## 2015-08-24 ENCOUNTER — Ambulatory Visit
Admission: RE | Admit: 2015-08-24 | Discharge: 2015-08-24 | Disposition: A | Discharge status: Home / Self Care | Payer: Medicare Other | Source: Ambulatory Visit

## 2015-08-24 DIAGNOSIS — Z1231 Encounter for screening mammogram for malignant neoplasm of breast: Secondary | ICD-10-CM | POA: Diagnosis not present

## 2015-09-11 ENCOUNTER — Other Ambulatory Visit: Payer: Self-pay | Admitting: Family Medicine

## 2015-09-13 DIAGNOSIS — Z471 Aftercare following joint replacement surgery: Secondary | ICD-10-CM | POA: Diagnosis not present

## 2015-09-13 DIAGNOSIS — Z96651 Presence of right artificial knee joint: Secondary | ICD-10-CM | POA: Diagnosis not present

## 2015-09-13 DIAGNOSIS — Z96642 Presence of left artificial hip joint: Secondary | ICD-10-CM | POA: Diagnosis not present

## 2015-09-25 DIAGNOSIS — M5033 Other cervical disc degeneration, cervicothoracic region: Secondary | ICD-10-CM | POA: Diagnosis not present

## 2015-09-25 DIAGNOSIS — M9901 Segmental and somatic dysfunction of cervical region: Secondary | ICD-10-CM | POA: Diagnosis not present

## 2015-09-25 DIAGNOSIS — M9902 Segmental and somatic dysfunction of thoracic region: Secondary | ICD-10-CM | POA: Diagnosis not present

## 2015-09-25 DIAGNOSIS — M62830 Muscle spasm of back: Secondary | ICD-10-CM | POA: Diagnosis not present

## 2015-09-30 ENCOUNTER — Telehealth: Payer: Self-pay | Admitting: Nurse Practitioner

## 2015-09-30 NOTE — Telephone Encounter (Signed)
Spoke with patient re LTS visit with Chestine Spore 6/5 @ 1 pm - date per patient due to out of town.

## 2015-10-11 ENCOUNTER — Telehealth: Payer: Self-pay | Admitting: Family Medicine

## 2015-10-11 ENCOUNTER — Ambulatory Visit (INDEPENDENT_AMBULATORY_CARE_PROVIDER_SITE_OTHER): Payer: Medicare Other

## 2015-10-11 ENCOUNTER — Other Ambulatory Visit (INDEPENDENT_AMBULATORY_CARE_PROVIDER_SITE_OTHER): Payer: Medicare Other

## 2015-10-11 VITALS — BP 126/80 | HR 54 | Temp 98.3°F | Ht 64.5 in | Wt 220.2 lb

## 2015-10-11 DIAGNOSIS — E785 Hyperlipidemia, unspecified: Secondary | ICD-10-CM

## 2015-10-11 DIAGNOSIS — Z0000 Encounter for general adult medical examination without abnormal findings: Secondary | ICD-10-CM | POA: Diagnosis not present

## 2015-10-11 DIAGNOSIS — D696 Thrombocytopenia, unspecified: Secondary | ICD-10-CM

## 2015-10-11 LAB — COMPREHENSIVE METABOLIC PANEL
ALBUMIN: 3.9 g/dL (ref 3.5–5.2)
ALT: 27 U/L (ref 0–35)
AST: 23 U/L (ref 0–37)
Alkaline Phosphatase: 65 U/L (ref 39–117)
BUN: 30 mg/dL — ABNORMAL HIGH (ref 6–23)
CALCIUM: 9.5 mg/dL (ref 8.4–10.5)
CHLORIDE: 103 meq/L (ref 96–112)
CO2: 29 meq/L (ref 19–32)
CREATININE: 1.08 mg/dL (ref 0.40–1.20)
GFR: 52.85 mL/min — ABNORMAL LOW (ref >60.00)
Glucose, Bld: 97 mg/dL (ref 70–99)
POTASSIUM: 4.2 meq/L (ref 3.5–5.1)
Sodium: 140 mEq/L (ref 135–145)
Total Bilirubin: 0.6 mg/dL (ref 0.2–1.2)
Total Protein: 7.5 g/dL (ref 6.0–8.3)

## 2015-10-11 LAB — CBC WITH DIFFERENTIAL/PLATELET
BASOS PCT: 0.9 % (ref 0.0–3.0)
Basophils Absolute: 0 10*3/uL (ref 0.0–0.1)
EOS PCT: 4.8 % (ref 0.0–5.0)
Eosinophils Absolute: 0.2 10*3/uL (ref 0.0–0.7)
HEMATOCRIT: 38.7 % (ref 36.0–46.0)
HEMOGLOBIN: 13.1 g/dL (ref 12.0–15.0)
LYMPHS PCT: 47.3 % — AB (ref 12.0–46.0)
Lymphs Abs: 2.3 10*3/uL (ref 0.7–4.0)
MCHC: 33.8 g/dL (ref 30.0–36.0)
MCV: 99.6 fl (ref 78.0–100.0)
MONOS PCT: 8.7 % (ref 3.0–12.0)
Monocytes Absolute: 0.4 10*3/uL (ref 0.1–1.0)
NEUTROS ABS: 1.9 10*3/uL (ref 1.4–7.7)
Neutrophils Relative %: 38.3 % — ABNORMAL LOW (ref 43.0–77.0)
PLATELETS: 139 10*3/uL — AB (ref 150.0–400.0)
RBC: 3.88 Mil/uL (ref 3.87–5.11)
RDW: 13.9 % (ref 11.5–15.5)
WBC: 5 10*3/uL (ref 4.0–10.5)

## 2015-10-11 LAB — LIPID PANEL
CHOL/HDL RATIO: 3
Cholesterol: 150 mg/dL (ref 0–200)
HDL: 43.5 mg/dL (ref >39.00)
LDL CALC: 80 mg/dL (ref 0–99)
NONHDL: 106.13
TRIGLYCERIDES: 133 mg/dL (ref 0.0–149.0)
VLDL: 26.6 mg/dL (ref 0.0–40.0)

## 2015-10-11 NOTE — Telephone Encounter (Signed)
-----   Message from Ellamae Sia sent at 10/04/2015 10:47 AM EDT ----- Regarding: Lab orders for 6.1.17 Patient is scheduled for CPX labs, please order future labs, Thanks , Karna Christmas

## 2015-10-11 NOTE — Progress Notes (Signed)
I reviewed health advisor's note, was available for consultation, and agree with documentation and plan.  Ta Fair, MD [facility] [place]  

## 2015-10-11 NOTE — Progress Notes (Signed)
Subjective:   Darlene Lawrence is a 75 y.o. female who presents for Medicare Annual (Subsequent) preventive examination.  Review of Systems:  N/A Cardiac Risk Factors include: advanced age (>58men, >58 women);obesity (BMI >30kg/m2);dyslipidemia;hypertension     Objective:     Vitals: BP 126/80 mmHg  Pulse 54  Temp(Src) 98.3 F (36.8 C) (Oral)  Ht 5' 4.5" (1.638 m)  Wt 220 lb 4 oz (99.905 kg)  BMI 37.24 kg/m2  SpO2 95%  Body mass index is 37.24 kg/(m^2).   Tobacco History  Smoking status  . Never Smoker   Smokeless tobacco  . Never Used     Counseling given: No   Past Medical History  Diagnosis Date  . Hyperlipidemia   . Hypertension   . breast cancer 2009    right  . Abnormal Pap smear   . Arthritis   . High cholesterol   . Meniscus tear     right  . DDD (degenerative disc disease), lumbar   . RBBB    Past Surgical History  Procedure Laterality Date  . Hernia repair      umbilical  . Tubal ligation    . Dilation and curettage of uterus  2012    Dr. Fermin Schwab  . Breast surgery Right 2009    lumpectomy, no radiation, no chemo  . Joint replacement Left     hip  . 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 Cancer Mother     breast and colon  . Hypertension Father   . Heart disease Father   . Thyroid disease Father   . Down syndrome Son   . Asthma Other    History  Sexual Activity  . Sexual Activity:  . Partners: Male  . Birth Control/ Protection: Post-menopausal    Outpatient Encounter Prescriptions as of 10/11/2015  Medication Sig  . Aspirin (ASPIR-81 PO) Take 1 tablet by mouth.  Marland Kitchen b complex vitamins tablet Take 1 tablet by mouth daily.  . Calcium Citrate-Vitamin D (CALCIUM CITRATE + PO) Take 1 tablet by mouth daily.  . Cholecalciferol (VITAMIN D3)  2000 UNITS TABS Take 1 capsule by mouth 2 (two) times daily.  . Coenzyme Q10 (COQ10) 100 MG CAPS Take 1 capsule by mouth daily.  . Magnesium 400 MG CAPS Take 1 capsule by mouth daily.  . Misc Natural Products (GLUCOSAMINE CHOND MSM FORMULA PO) Take 1,500 mg by mouth.  . Multiple Vitamin (MULTI VITAMIN DAILY PO) Take 1 tablet by mouth.  . Omega-3 Fatty Acids (FISH OIL) 1200 MG CAPS Take 1 capsule by mouth.  . rosuvastatin (CRESTOR) 10 MG tablet Take 10 mg by mouth. Take two times a week.  . triamterene-hydrochlorothiazide (MAXZIDE-25) 37.5-25 MG tablet TAKE 1/2 TABLET BY MOUTH EVERY DAY  . [DISCONTINUED] albuterol (PROVENTIL HFA;VENTOLIN HFA) 108 (90 BASE) MCG/ACT inhaler Inhale 2 puffs into the lungs every 6 (six) hours as needed for wheezing or shortness of breath. (Patient not taking: Reported on 09/18/2014)  . [DISCONTINUED] diclofenac (VOLTAREN) 75 MG EC tablet Take 1 tablet (75 mg total) by mouth 2 (two) times daily. (Patient not taking: Reported on 09/18/2014)   No facility-administered encounter medications on file as of 10/11/2015.    Activities of Daily Living In your present state of health, do you have any difficulty performing the  following activities: 10/11/2015  Hearing? N  Vision? N  Difficulty concentrating or making decisions? N  Walking or climbing stairs? N  Dressing or bathing? N  Doing errands, shopping? N  Preparing Food and eating ? N  Using the Toilet? N  In the past six months, have you accidently leaked urine? Y  Do you have problems with loss of bowel control? N  Managing your Medications? N  Managing your Finances? N  Housekeeping or managing your Housekeeping? N    Patient Care Team: Jinny Sanders, MD as PCP - General Nicholas Lose, MD as Consulting Physician (Hematology and Oncology) Gaynelle Arabian, MD as Consulting Physician (Orthopedic Surgery) Emily Filbert, MD as Consulting Physician (Obstetrics and Gynecology) Lupita Raider, DO as Referring Physician  (Optometry)    Assessment:     Hearing Screening   125Hz  250Hz  500Hz  1000Hz  2000Hz  4000Hz  8000Hz   Right ear:   0 0 40 40   Left ear:   40 0 40 40   Vision Screening Comments: Last eye exam in August 2016   Exercise Activities and Dietary recommendations Current Exercise Habits: Structured exercise class, Type of exercise: strength training/weights, Time (Minutes): 30, Frequency (Times/Week): 3, Weekly Exercise (Minutes/Week): 90, Intensity: Moderate, Exercise limited by: None identified  Goals    . Increase physical activity     Starting 10/11/2015, I will continue to go [employer] for 30 min 3 days per week in an effort to lose weight.       Fall Risk Fall Risk  10/11/2015 07/04/2014 11/11/2012  Falls in the past year? No No No   Depression Screen PHQ 2/9 Scores 10/11/2015 07/04/2014 11/11/2012  PHQ - 2 Score 0 0 0     Cognitive Testing MMSE - Mini Mental State Exam 10/11/2015  Orientation to time 5  Orientation to Place 5  Registration 3  Attention/ Calculation 0  Recall 3  Language- name 2 objects 0  Language- repeat 1  Language- follow 3 step command 3  Language- read & follow direction 0  Write a sentence 0  Copy design 0  Total score 20   PLEASE NOTE: A Mini-Cog screen was completed. Maximum score is 20. A value of 0 denotes this part of Folstein MMSE was not completed or the patient failed this part of the Mini-Cog screening.   Mini-Cog Screening Orientation to Time - Max 5 pts Orientation to Place - Max 5 pts Registration - Max 3 pts Recall - Max 3 pts Language Repeat - Max 1 pts Language Follow 3 Step Command - Max 3 pts   Immunization History  Administered Date(s) Administered  . Influenza Split 02/19/2011, 02/23/2012  . Influenza Whole 02/10/2007, 02/15/2008, 03/06/2010  . Influenza,inj,Quad PF,36+ Mos 02/20/2014  . Pneumococcal Conjugate-13 07/04/2014  . Pneumococcal Polysaccharide-23 07/11/2008  . Td 09/09/2000  . Tdap 11/11/2011  . Zoster 11/14/2009    Screening Tests Health Maintenance  Topic Date Due  . INFLUENZA VACCINE  12/11/2015  . MAMMOGRAM  08/23/2017  . TETANUS/TDAP  11/10/2021  . COLONOSCOPY  10/11/2023  . DEXA SCAN  Completed  . ZOSTAVAX  Completed  . PNA vac Low Risk Adult  Completed      Plan:     I have personally reviewed and addressed the Medicare Annual Wellness questionnaire and have noted the following in the patient's chart:  A. Medical and social history B. Use of alcohol, tobacco or illicit drugs  C. Current medications and supplements D. Functional ability and status E.  Nutritional status F.  Physical activity G. Advance directives H. List of other physicians I.  Hospitalizations, surgeries, and ER visits in previous 12 months J.  [place] to include hearing, vision, cognitive, depression L. Referrals and appointments - none  In addition, I have reviewed and discussed with patient certain preventive protocols, quality metrics, and best practice recommendations. A written personalized care plan for preventive services as well as general preventive health recommendations were provided to patient.  See attached scanned questionnaire for additional information.   Signed,   Lindell Noe, MHA, BS, LPN Health Advisor

## 2015-10-11 NOTE — Patient Instructions (Signed)
Darlene Lawrence , Thank you for taking time to come for your Medicare Wellness Visit. I appreciate your ongoing commitment to your health goals. Please review the following plan we discussed and let me know if I can assist you in the future.   These are the goals we discussed: Goals    . Increase physical activity     Starting 10/11/2015, I will continue to go [employer] for 30 min 3 days per week in an effort to lose weight.        This is a list of the screening recommended for you and due dates:  Health Maintenance  Topic Date Due  . Flu Shot  12/11/2015  . Mammogram  08/23/2017  . Tetanus Vaccine  11/10/2021  . Colon Cancer Screening  10/11/2023  . DEXA scan (bone density measurement)  Completed  . Shingles Vaccine  Completed  . Pneumonia vaccines  Completed    Preventive Care for Adults  A healthy lifestyle and preventive care can promote health and wellness. Preventive health guidelines for adults include the following key practices.  . A routine yearly physical is a good way to check with your health care provider about your health and preventive screening. It is a chance to share any concerns and updates on your health and to receive a thorough exam.  . Visit your dentist for a routine exam and preventive care every 6 months. Brush your teeth twice a day and floss once a day. Good oral hygiene prevents tooth decay and gum disease.  . The frequency of eye exams is based on your age, health, family medical history, use  of contact lenses, and other factors. Follow your health care provider's ecommendations for frequency of eye exams.  . Eat a healthy diet. Foods like vegetables, fruits, whole grains, low-fat dairy products, and lean protein foods contain the nutrients you need without too many calories. Decrease your intake of foods high in solid fats, added sugars, and salt. Eat the right amount of calories for you. Get information about a proper diet from your health care provider,  if necessary.  . Regular physical exercise is one of the most important things you can do for your health. Most adults should get at least 150 minutes of moderate-intensity exercise (any activity that increases your heart rate and causes you to sweat) each week. In addition, most adults need muscle-strengthening exercises on 2 or more days a week.  Silver Sneakers may be a benefit available to you. To determine eligibility, you may visit the website: www.silversneakers.com or contact program at (223) 053-1108 Mon-Fri between 8AM-8PM.   . Maintain a healthy weight. The body mass index (BMI) is a screening tool to identify possible weight problems. It provides an estimate of body fat based on height and weight. Your health care provider can find your BMI and can help you achieve or maintain a healthy weight.   For adults 20 years and older: ? A BMI below 18.5 is considered underweight. ? A BMI of 18.5 to 24.9 is normal. ? A BMI of 25 to 29.9 is considered overweight. ? A BMI of 30 and above is considered obese.   . Maintain normal blood lipids and cholesterol levels by exercising and minimizing your intake of saturated fat. Eat a balanced diet with plenty of fruit and vegetables. Blood tests for lipids and cholesterol should begin at age 68 and be repeated every 5 years. If your lipid or cholesterol levels are high, you are over 50, or you  are at high risk for heart disease, you may need your cholesterol levels checked more frequently. Ongoing high lipid and cholesterol levels should be treated with medicines if diet and exercise are not working.  . If you smoke, find out from your health care provider how to quit. If you do not use tobacco, please do not start.  . If you choose to drink alcohol, please do not consume more than 2 drinks per day. One drink is considered to be 12 ounces (355 mL) of beer, 5 ounces (148 mL) of wine, or 1.5 ounces (44 mL) of liquor.  . If you are 38-23 years old, ask  your health care provider if you should take aspirin to prevent strokes.  . Use sunscreen. Apply sunscreen liberally and repeatedly throughout the day. You should seek shade when your shadow is shorter than you. Protect yourself by wearing long sleeves, pants, a wide-brimmed hat, and sunglasses year round, whenever you are outdoors.  . Once a month, do a whole body skin exam, using a mirror to look at the skin on your back. Tell your health care provider of new moles, moles that have irregular borders, moles that are larger than a pencil eraser, or moles that have changed in shape or color.

## 2015-10-11 NOTE — Progress Notes (Signed)
Pre visit review using our clinic review tool, if applicable. No additional management support is needed unless otherwise documented below in the visit note. 

## 2015-10-11 NOTE — Progress Notes (Signed)
PCP notes:  Health maintenance:   No gaps identified or addressed.  Abnormal screenings:  Hearing - failed  Patient concerns: None  Nurse concerns: None  Next PCP appt: 10/16/15 @ 1400

## 2015-10-15 ENCOUNTER — Telehealth: Payer: Self-pay | Admitting: Nurse Practitioner

## 2015-10-15 ENCOUNTER — Ambulatory Visit (HOSPITAL_BASED_OUTPATIENT_CLINIC_OR_DEPARTMENT_OTHER): Payer: Medicare Other | Admitting: Nurse Practitioner

## 2015-10-15 ENCOUNTER — Encounter: Payer: Self-pay | Admitting: Nurse Practitioner

## 2015-10-15 VITALS — BP 144/67 | HR 60 | Temp 98.9°F | Resp 18 | Wt 222.8 lb

## 2015-10-15 DIAGNOSIS — Z853 Personal history of malignant neoplasm of breast: Secondary | ICD-10-CM

## 2015-10-15 DIAGNOSIS — C50911 Malignant neoplasm of unspecified site of right female breast: Secondary | ICD-10-CM

## 2015-10-15 NOTE — Telephone Encounter (Signed)
Gave pt avs, pt says [facility] contacts her for yrly mammo

## 2015-10-15 NOTE — Progress Notes (Signed)
CLINIC:  Cancer Survivorship   REASON FOR VISIT:  Routine follow-up post-treatment for history of breast cancer.  BRIEF ONCOLOGIC HISTORY:    Malignant neoplasm of right female breast ([place])   07/14/2007 Mammogram Mass in the right breast: Ultrasound showed a spiculated mass 10:00 position 7 x 7 x 8 mm   08/18/2007 Initial Diagnosis Invasive mammary cancer; ER+ (90%), PR+ (59%), Ki67 9%, HER2/neu negative   08/26/2007 Breast MRI Right breast upper outer quadrant 9 x 8 x 8 mm mass without lymphadenopathy   08/26/2007 Clinical Stage Stage IA: T1b N0   10/06/2007 Surgery Right breast lumpectomy with SLN biopsy, grade 1 IDC 1 cm with associated DCIS no LVI, ER 90%, PR 58%, Ki-67 9%, HER-2 negative. 0/1 LN   10/06/2007 Pathologic Stage Stage IA: T1b N0   11/24/2007 - 12/24/2007 Radiation Therapy Adjuvant radiation therapy to the right breast   01/13/2008 - 01/13/2013 Anti-estrogen oral therapy Tamoxifen 20 mg daily x 5 years    INTERVAL HISTORY:  Darlene Lawrence presents to [facility] today for ongoing follow up regarding her history of breast cancer. Overall, [name] reports doing well since her last visit with Dr. Lindi Adie in May 2016.  She has not noticed any change within her breast and her last mammogram was in April 2017 and unremarkable. She denies any headache, cough, shortness of breath, or bone pain other than scattered aches in her thumbs, feet and right knee following knee replacement.  She reports a good appetite and denies any weight loss. She is scheduled to see Dr. Diona Browner for her AWV tomorrow. She continues on her calcium due to her history of osteopenia.   REVIEW OF SYSTEMS:  General: Denies fever, chills, unintentional weight loss, or generalized fatigue.  HEENT: Wears glasses. Denies visual changes, hearing loss, mouth sores, or difficulty swallowing. Cardiac: Denies palpitations and lower extremity edema.  Respiratory: Denies wheeze or dyspnea on exertion.  Breast: As  above. GI: Denies abdominal pain, constipation, diarrhea, nausea, or vomiting.  GU: Denies dysuria, hematuria, vaginal bleeding, vaginal discharge, or vaginal dryness.  Musculoskeletal: As above. Neuro: Denies recent fall or numbness / tingling in her extremities.  Skin: Denies rash, pruritis, or open wounds.  Psych: Denies depression, anxiety, insomnia, or memory loss.   A 14-point review of systems was completed and was negative, except as noted above.   ONCOLOGY TREATMENT TEAM:  1. Surgeon:  Dr. Reeves Forth (retired)  2. Medical Oncologist: Dr. Truddie Coco / Lindi Adie 3. Radiation Oncologist: Uncertain (cannot ascertain from medical record)    PAST MEDICAL/SURGICAL HISTORY:  Past Medical History  Diagnosis Date  . Hyperlipidemia   . Hypertension   . breast cancer 2009    right  . Abnormal Pap smear   . Arthritis   . High cholesterol   . Meniscus tear     right  . DDD (degenerative disc disease), lumbar   . RBBB    Past Surgical History  Procedure Laterality Date  . Hernia repair      umbilical  . Tubal ligation    . Dilation and curettage of uterus  2012    Dr. Fermin Schwab  . Breast surgery Right 2009    lumpectomy, no radiation, no chemo  . Joint replacement Left     hip  . Total knee arthroplasty Right 06/06/2013    Procedure: RIGHT TOTAL KNEE ARTHROPLASTY;  Surgeon: [name], MD;  Location: [facility];  Service: Orthopedics;  Laterality: Right;     ALLERGIES:  Allergies  Allergen Reactions  . Cefixime     REACTION: Rash  . Levaquin [Levofloxacin] Other (See Comments)    Shoulder Bursitis  . Penicillins     REACTION: Rash     CURRENT MEDICATIONS:  Current Outpatient Prescriptions on File Prior to Visit  Medication Sig Dispense Refill  . Aspirin (ASPIR-81 PO) Take 1 tablet by mouth.    Marland Kitchen b complex vitamins tablet Take 1 tablet by mouth daily.    . Calcium Citrate-Vitamin D (CALCIUM CITRATE + PO) Take 1 tablet by mouth daily.    . Cholecalciferol (VITAMIN  D3) 2000 UNITS TABS Take 1 capsule by mouth 2 (two) times daily.    . Coenzyme Q10 (COQ10) 100 MG CAPS Take 1 capsule by mouth daily.    . Magnesium 400 MG CAPS Take 1 capsule by mouth daily.    . Misc Natural Products (GLUCOSAMINE CHOND MSM FORMULA PO) Take 1,500 mg by mouth.    . Multiple Vitamin (MULTI VITAMIN DAILY PO) Take 1 tablet by mouth.    . Omega-3 Fatty Acids (FISH OIL) 1200 MG CAPS Take 1 capsule by mouth.    . rosuvastatin (CRESTOR) 10 MG tablet Take 10 mg by mouth. Take two times a week.    . triamterene-hydrochlorothiazide (MAXZIDE-25) 37.5-25 MG tablet TAKE 1/2 TABLET BY MOUTH EVERY DAY 45 tablet 0   No current facility-administered medications on file prior to visit.     ONCOLOGIC FAMILY HISTORY:  Family History  Problem Relation Age of Onset  . Cancer Mother     breast and colon  . Hypertension Father   . Heart disease Father   . Thyroid disease Father   . Down syndrome Son   . Asthma Other      GENETIC COUNSELING/TESTING: No   SOCIAL HISTORY:  Darlene Lawrence is married and lives with her spouse in [place], [place].  She has 2 children. 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  She denies any current or history of tobacco or illicit drug use.  She uses alcohol approximately 1 time / week or less   PHYSICAL EXAMINATION:  Vital Signs: Filed Vitals:   10/15/15 1300  BP: 144/67  Pulse: 60  Temp: 98.9 F (37.2 C)  Resp: 18   Weight 222.8 (increase of 7# since May 2016) ECOG performance status: 0 General: Well-nourished, well-appearing female in no acute distress.  She is unaccompanied in clinic today.   HEENT: Head is atraumatic and normocephalic.  Pupils equal and reactive to light and accomodation. Conjunctivae clear without exudate.  Sclerae anicteric. Oral mucosa is pink, moist, and intact without lesions.  Oropharynx is pink without lesions or erythema.  Lymph: No cervical,  supraclavicular, infraclavicular, or axillary lymphadenopathy noted on palpation.  Cardiovascular: Regular rate and rhythm without murmurs, rubs, or gallops. Respiratory: Clear to auscultation bilaterally. Chest expansion symmetric without accessory muscle use on inspiration or expiration.  Breast: Bilateral breast exam performed.  Right lumpectomy scar intact and without nodularity. Scattered post radiation thickening in right breast.  No mass or lesion bilaterally.   GI: Abdomen soft and round. No tenderness to palpation. Bowel sounds normoactive in 4 quadrants. No hepatosplenomegaly.   GU: Deferred.  Musculoskeletal: Muscle strength 5/5 in all extremities.   Neuro: No focal deficits. Steady gait.  Psych: Mood and affect normal and appropriate for situation.  Extremities: No edema, cyanosis, or clubbing.  Skin: Warm and dry. No open lesions noted.  LABORATORY DATA:  Recent Results (from the past 2160 hour(s))  Comprehensive metabolic panel     Status: Abnormal   Collection Time: 10/11/15  8:41 AM  Result Value Ref Range   Sodium 140 135 - 145 mEq/L   Potassium 4.2 3.5 - 5.1 mEq/L   Chloride 103 96 - 112 mEq/L   CO2 29 19 - 32 mEq/L   Glucose, Bld 97 70 - 99 mg/dL   BUN 30 (H) 6 - 23 mg/dL   Creatinine, Ser 1.08 0.40 - 1.20 mg/dL   Total Bilirubin 0.6 0.2 - 1.2 mg/dL   Alkaline Phosphatase 65 39 - 117 U/L   AST 23 0 - 37 U/L   ALT 27 0 - 35 U/L   Total Protein 7.5 6.0 - 8.3 g/dL   Albumin 3.9 3.5 - 5.2 g/dL   Calcium 9.5 8.4 - 10.5 mg/dL   GFR 52.85 (L) >60.00 mL/min  CBC with Differential/Platelet     Status: Abnormal   Collection Time: 10/11/15  8:41 AM  Result Value Ref Range   WBC 5.0 4.0 - 10.5 K/uL   RBC 3.88 3.87 - 5.11 Mil/uL   Hemoglobin 13.1 12.0 - 15.0 g/dL   HCT 38.7 36.0 - 46.0 %   MCV 99.6 78.0 - 100.0 fl   MCHC 33.8 30.0 - 36.0 g/dL   RDW 13.9 11.5 - 15.5 %   Platelets 139.0 (L) 150.0 - 400.0 K/uL   Neutrophils Relative % 38.3 (L) 43.0 - 77.0 %    Lymphocytes Relative 47.3 (H) 12.0 - 46.0 %   Monocytes Relative 8.7 3.0 - 12.0 %   Eosinophils Relative 4.8 0.0 - 5.0 %   Basophils Relative 0.9 0.0 - 3.0 %   Neutro Abs 1.9 1.4 - 7.7 K/uL   Lymphs Abs 2.3 0.7 - 4.0 K/uL   Monocytes Absolute 0.4 0.1 - 1.0 K/uL   Eosinophils Absolute 0.2 0.0 - 0.7 K/uL   Basophils Absolute 0.0 0.0 - 0.1 K/uL  Lipid panel     Status: None   Collection Time: 10/11/15  8:41 AM  Result Value Ref Range   Cholesterol 150 0 - 200 mg/dL    Comment: ATP III Classification       Desirable:  < 200 mg/dL               Borderline High:  200 - 239 mg/dL          High:  > = 240 mg/dL   Triglycerides 133.0 0.0 - 149.0 mg/dL    Comment: Normal:  <150 mg/dLBorderline High:  150 - 199 mg/dL   HDL 43.50 >39.00 mg/dL   VLDL 26.6 0.0 - 40.0 mg/dL   LDL Cholesterol 80 0 - 99 mg/dL   Total CHOL/HDL Ratio 3     Comment:                Men          Women1/2 Average Risk     3.4          3.3Average Risk          5.0          4.42X Average Risk          9.6          7.13X Average Risk          15.0          11.0  NonHDL 106.13     Comment: NOTE:  Non-HDL goal should be 30 mg/dL higher than patient's LDL goal (i.e. LDL goal of < 70 mg/dL, would have non-HDL goal of < 100 mg/dL)    DIAGNOSTIC IMAGING: Bilateral diagnostic mammogram performed 08/24/2015 reveals expected post lumpectomy changes without suspicious mass or lesion.       ASSESSMENT AND PLAN:   1. History of breast cancer: Stage IA invasive ductal carcinoma of the right breast (2009), ER positive, PR positive, HER2/neu negative, S/P lumpectomy (09/2007) followed by adjuvant radiation therapy completed 12/2007, followed by five years of adjuvant endocrine therapy with tamoxifen completed 01/2013, now followed in a program of surveillance. Labs stable from oncology perspective.  Ms. Seelig is doing well with no clinical symptoms worrisome for cancer recurrence at this time. I have reviewed the  recommendations for ongoing surveillance with her and she will follow-up with [place] in one year's time with history and physical exam per surveillance protocol. She was instructed to make [place] aware if she notes any change within her breast, any new symptoms such as pain, shortness of breath, weight loss, or fatigue.  Her next mammogram will be due in April 2018 and we have entered orders for this following today's visit.  I have asked her to let us know when she sees her gynecologist and if possible, we can move her follow up appointment with [place] so that she is alternating her annual visits between [place] and her GYN every six months.  2. Cancer screening:  Due to Ms. Rashid's history and her age, she should receive screening for skin cancers, colon cancer, and gynecologic cancers.  The information and recommendations were shared with the patient and in her written after visit summary.  3. Health maintenance and wellness promotion:Ms. Oyola and I discussed recommendations to maximize nutrition and minimize recurrence, such as increased intake of fruits, vegetables, lean proteins, and minimizing the intake of red meats and processed foods.  She was also encouraged to engage in moderate to vigorous exercise for 30 minutes per day most days of the week. She was instructed to limit her alcohol consumption and continue to abstain from tobacco use.     4. Support services/counseling: Ms. Tally was offered support today through active listening and expressive supportive counseling.  She was congratulated on her 8th year of being a cancer survivor and wished well for many years ahead!   A total of 25 minutes of face-to-face time was spent with this patient with greater than 50% of that time in counseling and care-coordination.   Sylvan Cheese, NP  Survivorship [facility] [phone]   Note: PRIMARY CARE PROVIDER Eliezer Lofts, [place] [phone]

## 2015-10-15 NOTE — Patient Instructions (Signed)
Thank you for coming in today!  As we discussed, please continue to perform your self breast exam and report any changes. If you note any new symptoms (please see below), be sure to notify us ASAP.  Your mammogram will be due in April 2018 and we will enter orders for it today.  We'll have you return in one year's time for your next appointment or sooner if you have any problems. Please be sure to stop by scheduling on your way out to make those appointment(s).  Looking forward to working with you in the future!  Let us know if you have any questions!  Symptoms to Watch for and Report to Your Provider  . Return of the cancer symptoms you had before- such as a lump or new growth where your cancer first started . New or unusual pain that seems unrelated to an injury and does not go away, including back pain or bone pain . Weight loss without trying/intending . Unexplained bleeding . A rash or allergic reaction, such as swelling, severe itching or wheezing . Chills or fevers . Persistent headaches . Shortness of breath or difficulty breathing . Bloody stools or blood in your urine . Lumps, bumps, swelling and/or nipple discharge . Nausea, vomiting, diarrhea, loss of appetite, or trouble swallowing . A cough that doesn't go away . Abdominal pain . Swelling in your arms or legs . Fractures . Hot flashes or other menopausal symptoms . Any other signs mentioned by your doctor or nurse or any unusual symptoms                 that you just can't explain   NOTE: Just because you have certain symptoms, it doesn't mean the cancer has come back or you have a new cancer. Symptoms can be due to other problems that need to be addressed.  It is important to watch for these symptoms and report them to your provider so you can be medically evaluated for any of these concerns!    Living a Life of Wellness After Cancer:  *Note: Please consult your health care provider before using any medications, supplements,  over-the-counter products, or other interventions.  Also, please consult your primary care provider before you begin any lifestyle program (diet, exercise, etc.).  Your safety is our top priority and we want to make sure you continue to live a long and healthy life!    Healthy Lifestyle Recommendations  As a cancer survivor, it is important develop a lifelong commitment to a healthy lifestyle. A healthy lifestyle can prevent cancer from returning as well as prevent other diseases like heart disease, diabetes and high blood pressure.  These are some things that you can do to have a healthy lifestyle:  Marland Kitchen Maintain a healthy weight.  . Exercise daily per your doctor's orders. . Eat a balanced diet high in fruits, vegetables, bran, and fiber. Limit intake of red meat      and processed foods.  . Limit how much alcohol you consume, if at all. Ali Lowe regular bone mineral density testing for osteoporosis.  . Talk to your doctor about cardiovascular disease or "heart disease" screening. . Stop smoking (if you smoke). . Know your family history. . Be mindful of your emotional, social, and spiritual needs. . Meet regularly with a Primary Care Provider (PCP). Find a PCP if you do not             already have one. . Talk to your doctor about  regular cancer screening including screening for colon           cancer, GYN cancers, and skin cancer.

## 2015-10-16 ENCOUNTER — Ambulatory Visit (INDEPENDENT_AMBULATORY_CARE_PROVIDER_SITE_OTHER): Payer: Medicare Other | Admitting: Family Medicine

## 2015-10-16 ENCOUNTER — Encounter: Payer: Self-pay | Admitting: Family Medicine

## 2015-10-16 VITALS — BP 149/77 | HR 58 | Temp 98.4°F | Ht 64.5 in | Wt 222.0 lb

## 2015-10-16 DIAGNOSIS — D696 Thrombocytopenia, unspecified: Secondary | ICD-10-CM

## 2015-10-16 DIAGNOSIS — N183 Chronic kidney disease, stage 3 unspecified: Secondary | ICD-10-CM | POA: Insufficient documentation

## 2015-10-16 DIAGNOSIS — I10 Essential (primary) hypertension: Secondary | ICD-10-CM

## 2015-10-16 DIAGNOSIS — E785 Hyperlipidemia, unspecified: Secondary | ICD-10-CM

## 2015-10-16 DIAGNOSIS — H9193 Unspecified hearing loss, bilateral: Secondary | ICD-10-CM

## 2015-10-16 DIAGNOSIS — R5382 Chronic fatigue, unspecified: Secondary | ICD-10-CM

## 2015-10-16 NOTE — Progress Notes (Signed)
Pre visit review using our clinic review tool, if applicable. No additional management support is needed unless otherwise documented below in the visit note. 

## 2015-10-16 NOTE — Assessment & Plan Note (Signed)
Stable since 2009, no change. Following.

## 2015-10-16 NOTE — Patient Instructions (Addendum)
Follow BP at home, call if consistently > 140/90.  Avoid advil and use tylenol. Push fluids.  Work on weight loss, increase exercise and work on [employer].  Stop at lab on way out.

## 2015-10-16 NOTE — Assessment & Plan Note (Signed)
Borderline control on maxide.. Follow at home.

## 2015-10-16 NOTE — Assessment & Plan Note (Signed)
Stable , avoid NSAIDs and nephrotoxic meds. Keep up hydration.

## 2015-10-16 NOTE — Assessment & Plan Note (Signed)
Mild. Not bothersome to patient.

## 2015-10-16 NOTE — Progress Notes (Signed)
She saw Candis Musa, LPN for medicare wellness on 10/11/2015. Note reviewed in detail.  She does feel tired all the time, ongoing for years. Gradually worsening.  No depression. No new meds. Occ low back pain if up on feet a lot. Improves with advil.  Hypertension: Well controlled on current meds.  Maxide. BP Readings from Last 3 Encounters:  10/16/15 149/77  10/15/15 144/67  10/11/15 126/80  Using medication without problems or lightheadedness: None Using medications without problems: None  No chest pain with exertion.  Occ edema in ankles. No SOB.  At home: not checking. Other complaints:   Elevated Cholesterol: LDL at goal < 130 on crestor 10 mg daily twice a week , fish oil co Q 10. Lab Results  Component Value Date   CHOL 150 10/11/2015   HDL 43.50 10/11/2015   LDLCALC 80 10/11/2015   TRIG 133.0 10/11/2015   CHOLHDL 3 10/11/2015  Using medications without problems:None Muscle aches: None Diet compliance:Moderate Exercise:Going to [place] three times a week. Occ walking. Other complaints:   CKD: stable  Breast Cancer: Going to survivors clinic. Completed tamoxifen. Breast exam was normal.  No further testing.   Left hip replacement in 2012. Dr. Maureen Ralphs.  Recovered well.  Moving better, able to sleep beter.  Right knee replacement in 05/2013  Social History /Family History/Past Medical History reviewed and updated if needed.  Review of Systems  Constitutional: Negative for fever and some Fatigue ongoing for a while.  HENT: Negative for ear pain.  Eyes: Negative for pain.  Respiratory: Negative for chest tightness and shortness of breath.  Cardiovascular: Negative for chest pain, palpitations and leg swelling.  Gastrointestinal: Negative for abdominal pain. No blood in stool.  Genitourinary: Negative for dysuria.  Objective:   Physical Exam  Constitutional:  Central OBESITYVital signs are normal. She appears well-developed and  well-nourished. She is cooperative. Non-toxic appearance. She does not appear ill. No distress.  HENT:  Head: Normocephalic.  Right Ear: Hearing, tympanic membrane, external ear and ear canal normal.  Left Ear: Hearing, tympanic membrane, external ear and ear canal normal.  Nose: Nose normal.  Eyes: Conjunctivae, EOM and lids are normal. Pupils are equal, round, and reactive to light. No foreign bodies found.  Neck: Trachea normal and normal range of motion. Neck supple. Carotid bruit is not present. No mass and no thyromegaly present.  Cardiovascular: Normal rate, regular rhythm, S1 normal, S2 normal, normal heart sounds and intact distal pulses. Exam reveals no gallop.  No murmur heard.  Pulmonary/Chest: Effort normal and breath sounds normal. No respiratory distress. She has no wheezes. She has no rhonchi. She has no rales.  Abdominal: Soft. Normal appearance and bowel sounds are normal. She exhibits no distension, no fluid wave, no abdominal bruit and no mass. There is no hepatosplenomegaly. There is no tenderness. There is no rebound, no guarding and no CVA tenderness. No hernia.  Genitourinary: .  Per Dr. Hulan Fray  Lymphadenopathy:  She has no cervical adenopathy.  She has no axillary adenopathy.  Neurological: She is alert. She has normal strength. No cranial nerve deficit or sensory deficit.  Skin: Skin is warm, dry and intact. No rash noted.  Psychiatric: Her speech is normal and behavior is normal. Judgment normal. Her mood appears not anxious. Cognition and memory are normal. She does not exhibit a depressed mood.  Assessment & Plan:   The patient's preventative maintenance and recommended screening tests for an annual wellness exam were reviewed in full today.  Brought up  to date unless services declined.  Counselled on the importance of diet, exercise, and its role in overall health and mortality.  The patient's FH and SH was reviewed, including their  tobacco status,  and drug and alcohol status.   Vaccines: Uptodate . PAP/DVE: Per Dr. Hulan Fray.  Following uterine prolapse. Decided to not move ahead with hysterectomy given SE risks. Breast exam/mammogram: per [facility] Colonoscopy: Dr. Cristina Gong 10/2013 polyps repeat in 5 years. Nonsmoker  DEXA: last normal in 2013, repeat in 5 years. Failed hearing screening. She has been aware her hearing is diminished, but not bothering much.

## 2015-10-16 NOTE — Assessment & Plan Note (Signed)
Well controlled. Continue current medication. Encouraged exercise, weight loss, healthy eating habits.  

## 2015-10-17 LAB — VITAMIN B12: VITAMIN B 12: 949 pg/mL — AB (ref 211–911)

## 2015-10-17 LAB — T3, FREE: T3, Free: 3 pg/mL (ref 2.3–4.2)

## 2015-10-17 LAB — T4, FREE: Free T4: 0.7 ng/dL (ref 0.60–1.60)

## 2015-10-17 LAB — VITAMIN D 25 HYDROXY (VIT D DEFICIENCY, FRACTURES): VITD: 45.75 ng/mL (ref 30.00–100.00)

## 2015-10-17 LAB — TSH: TSH: 3.38 u[IU]/mL (ref 0.35–4.50)

## 2015-10-23 DIAGNOSIS — M5033 Other cervical disc degeneration, cervicothoracic region: Secondary | ICD-10-CM | POA: Diagnosis not present

## 2015-10-23 DIAGNOSIS — M62830 Muscle spasm of back: Secondary | ICD-10-CM | POA: Diagnosis not present

## 2015-10-23 DIAGNOSIS — M9901 Segmental and somatic dysfunction of cervical region: Secondary | ICD-10-CM | POA: Diagnosis not present

## 2015-10-23 DIAGNOSIS — M9902 Segmental and somatic dysfunction of thoracic region: Secondary | ICD-10-CM | POA: Diagnosis not present

## 2015-12-04 DIAGNOSIS — M9901 Segmental and somatic dysfunction of cervical region: Secondary | ICD-10-CM | POA: Diagnosis not present

## 2015-12-04 DIAGNOSIS — M62830 Muscle spasm of back: Secondary | ICD-10-CM | POA: Diagnosis not present

## 2015-12-04 DIAGNOSIS — M5033 Other cervical disc degeneration, cervicothoracic region: Secondary | ICD-10-CM | POA: Diagnosis not present

## 2015-12-04 DIAGNOSIS — M9902 Segmental and somatic dysfunction of thoracic region: Secondary | ICD-10-CM | POA: Diagnosis not present

## 2015-12-07 ENCOUNTER — Other Ambulatory Visit: Payer: Self-pay | Admitting: Family Medicine

## 2015-12-27 DIAGNOSIS — H2513 Age-related nuclear cataract, bilateral: Secondary | ICD-10-CM | POA: Diagnosis not present

## 2016-01-01 DIAGNOSIS — M9902 Segmental and somatic dysfunction of thoracic region: Secondary | ICD-10-CM | POA: Diagnosis not present

## 2016-01-01 DIAGNOSIS — M9901 Segmental and somatic dysfunction of cervical region: Secondary | ICD-10-CM | POA: Diagnosis not present

## 2016-01-01 DIAGNOSIS — M5033 Other cervical disc degeneration, cervicothoracic region: Secondary | ICD-10-CM | POA: Diagnosis not present

## 2016-01-01 DIAGNOSIS — M62830 Muscle spasm of back: Secondary | ICD-10-CM | POA: Diagnosis not present

## 2016-02-05 DIAGNOSIS — Z23 Encounter for immunization: Secondary | ICD-10-CM | POA: Diagnosis not present

## 2016-02-05 DIAGNOSIS — M9901 Segmental and somatic dysfunction of cervical region: Secondary | ICD-10-CM | POA: Diagnosis not present

## 2016-02-05 DIAGNOSIS — M9902 Segmental and somatic dysfunction of thoracic region: Secondary | ICD-10-CM | POA: Diagnosis not present

## 2016-02-05 DIAGNOSIS — M5033 Other cervical disc degeneration, cervicothoracic region: Secondary | ICD-10-CM | POA: Diagnosis not present

## 2016-02-05 DIAGNOSIS — M62830 Muscle spasm of back: Secondary | ICD-10-CM | POA: Diagnosis not present

## 2016-03-11 DIAGNOSIS — M62830 Muscle spasm of back: Secondary | ICD-10-CM | POA: Diagnosis not present

## 2016-03-11 DIAGNOSIS — M9901 Segmental and somatic dysfunction of cervical region: Secondary | ICD-10-CM | POA: Diagnosis not present

## 2016-03-11 DIAGNOSIS — M5033 Other cervical disc degeneration, cervicothoracic region: Secondary | ICD-10-CM | POA: Diagnosis not present

## 2016-03-11 DIAGNOSIS — M9902 Segmental and somatic dysfunction of thoracic region: Secondary | ICD-10-CM | POA: Diagnosis not present

## 2016-03-14 ENCOUNTER — Encounter: Payer: Self-pay | Admitting: Obstetrics & Gynecology

## 2016-03-14 ENCOUNTER — Ambulatory Visit (INDEPENDENT_AMBULATORY_CARE_PROVIDER_SITE_OTHER): Payer: Medicare Other | Admitting: Obstetrics & Gynecology

## 2016-03-14 VITALS — BP 132/77 | HR 66 | Ht 66.0 in | Wt 221.0 lb

## 2016-03-14 DIAGNOSIS — Z01419 Encounter for gynecological examination (general) (routine) without abnormal findings: Secondary | ICD-10-CM | POA: Diagnosis not present

## 2016-03-14 NOTE — Progress Notes (Signed)
Subjective:    Darlene Lawrence is a 75 y.o. MW P2 (76 and 51yo kids, 1 grand) female who presents for an annual exam. The patient has no complaints today. The patient is sexually active. GYN screening history: last pap: was normal. The patient wears seatbelts: yes. The patient participates in regular exercise: yes. Has the patient ever been transfused or tattooed?: yes, transfusion with last delivery. The patient reports that there is not domestic violence in her life.   Menstrual History: OB History    Gravida Para Term Preterm AB Living   3 2 2  0 1 2   SAB TAB Ectopic Multiple Live Births   1 0 0 0 2      Menarche age: 44 No LMP recorded. Patient is postmenopausal.    The following portions of the patient's history were reviewed and updated as appropriate: allergies, current medications, past family history, past medical history, past social history, past surgical history and problem list.  Review of Systems Pertinent items are noted in HPI.   Her son has Down's syndrome and is at [facility] Married for 53 years, Denies dyspareunia Canceled her hyster with Dr. Zigmund Daniel due to concern about surgical risks. Retired from [employer], academic Mammogram 4/17 Colonoscopy about 4 years, due next year or so Dr. Diona Browner is her [facility] doc She had her flu vaccine this season.   Objective:    BP 132/77   Pulse 66   Ht 5\' 6"  (1.676 m)   Wt 221 lb (100.2 kg)   BMI 35.67 kg/m   General Appearance:    Alert, cooperative, no distress, appears stated age  Head:    Normocephalic, without obvious abnormality, atraumatic  Eyes:    PERRL, conjunctiva/corneas clear, EOM's intact, fundi    benign, both eyes  Ears:    Normal TM's and external ear canals, both ears  Nose:   Nares normal, septum midline, mucosa normal, no drainage    or sinus tenderness  Throat:   Lips, mucosa, and tongue normal; teeth and gums normal  Neck:   Supple, symmetrical, trachea midline, no adenopathy;    thyroid:  no  enlargement/tenderness/nodules; no carotid   bruit or JVD  Back:     Symmetric, no curvature, ROM normal, no CVA tenderness  Lungs:     Clear to auscultation bilaterally, respirations unlabored  Chest Wall:    No tenderness or deformity   Heart:    Regular rate and rhythm, S1 and S2 normal, no murmur, rub   or gallop  Breast Exam:    No tenderness, masses, or nipple abnormality  Abdomen:     Soft, non-tender, bowel sounds active all four quadrants,    no masses, no organomegaly  Genitalia:    Normal female without lesion, discharge or tenderness     Extremities:   Extremities normal, atraumatic, no cyanosis or edema  Pulses:   2+ and symmetric all extremities  Skin:   Skin color, texture, turgor normal, no rashes or lesions  Lymph nodes:   Cervical, supraclavicular, and axillary nodes normal  Neurologic:   CNII-XII intact, normal strength, sensation and reflexes    throughout   .    Assessment:    Healthy female exam.    Plan:     Breast self exam technique reviewed and patient encouraged to perform self-exam monthly.

## 2016-03-20 ENCOUNTER — Encounter: Payer: Self-pay | Admitting: *Deleted

## 2016-04-08 DIAGNOSIS — M9902 Segmental and somatic dysfunction of thoracic region: Secondary | ICD-10-CM | POA: Diagnosis not present

## 2016-04-08 DIAGNOSIS — M5033 Other cervical disc degeneration, cervicothoracic region: Secondary | ICD-10-CM | POA: Diagnosis not present

## 2016-04-08 DIAGNOSIS — M62830 Muscle spasm of back: Secondary | ICD-10-CM | POA: Diagnosis not present

## 2016-04-08 DIAGNOSIS — M9901 Segmental and somatic dysfunction of cervical region: Secondary | ICD-10-CM | POA: Diagnosis not present

## 2016-05-06 DIAGNOSIS — M62830 Muscle spasm of back: Secondary | ICD-10-CM | POA: Diagnosis not present

## 2016-05-06 DIAGNOSIS — M9901 Segmental and somatic dysfunction of cervical region: Secondary | ICD-10-CM | POA: Diagnosis not present

## 2016-05-06 DIAGNOSIS — M9902 Segmental and somatic dysfunction of thoracic region: Secondary | ICD-10-CM | POA: Diagnosis not present

## 2016-05-06 DIAGNOSIS — M5033 Other cervical disc degeneration, cervicothoracic region: Secondary | ICD-10-CM | POA: Diagnosis not present

## 2016-05-07 ENCOUNTER — Ambulatory Visit (INDEPENDENT_AMBULATORY_CARE_PROVIDER_SITE_OTHER): Payer: Medicare Other | Admitting: Podiatry

## 2016-05-07 ENCOUNTER — Ambulatory Visit (INDEPENDENT_AMBULATORY_CARE_PROVIDER_SITE_OTHER): Payer: Medicare Other

## 2016-05-07 ENCOUNTER — Encounter: Payer: Self-pay | Admitting: Podiatry

## 2016-05-07 DIAGNOSIS — M779 Enthesopathy, unspecified: Secondary | ICD-10-CM

## 2016-05-07 DIAGNOSIS — M79672 Pain in left foot: Secondary | ICD-10-CM

## 2016-05-07 DIAGNOSIS — M778 Other enthesopathies, not elsewhere classified: Secondary | ICD-10-CM

## 2016-05-07 DIAGNOSIS — M7752 Other enthesopathy of left foot: Secondary | ICD-10-CM

## 2016-05-07 DIAGNOSIS — M2042 Other hammer toe(s) (acquired), left foot: Secondary | ICD-10-CM | POA: Diagnosis not present

## 2016-05-07 DIAGNOSIS — D3613 Benign neoplasm of peripheral nerves and autonomic nervous system of lower limb, including hip: Secondary | ICD-10-CM

## 2016-05-07 NOTE — Progress Notes (Signed)
She presents today with chief complaint of pain to the left foot. She states that she's been sitting for a while and gets back up to walk she has a lot of pain. She states that she has pain first thing in the morning as well she states that the pain is not around the heel but rather the forefoot.  Objective: Vital signs are stable alert and oriented 3. Pulses are palpable. Neurologic sensory was intact. Reflexes are intact. Muscle strength is normal bilateral. Orthopedic evaluation was resulted was distal to the ankle for range of motion without crepitation. She has pain on palpation third interdigital space of the left foot. Mild hammertoe deformities rigid in nature at [facility] at the level of PIPJ's radiographs confirm this.  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  Plan: Injected Kenalog and local anesthetic to the third interdigital space today.

## 2016-06-03 DIAGNOSIS — M9902 Segmental and somatic dysfunction of thoracic region: Secondary | ICD-10-CM | POA: Diagnosis not present

## 2016-06-03 DIAGNOSIS — M5033 Other cervical disc degeneration, cervicothoracic region: Secondary | ICD-10-CM | POA: Diagnosis not present

## 2016-06-03 DIAGNOSIS — M62830 Muscle spasm of back: Secondary | ICD-10-CM | POA: Diagnosis not present

## 2016-06-03 DIAGNOSIS — M9901 Segmental and somatic dysfunction of cervical region: Secondary | ICD-10-CM | POA: Diagnosis not present

## 2016-06-18 ENCOUNTER — Ambulatory Visit (INDEPENDENT_AMBULATORY_CARE_PROVIDER_SITE_OTHER): Payer: Medicare Other | Admitting: Podiatry

## 2016-06-18 ENCOUNTER — Encounter: Payer: Self-pay | Admitting: Podiatry

## 2016-06-18 DIAGNOSIS — D3613 Benign neoplasm of peripheral nerves and autonomic nervous system of lower limb, including hip: Secondary | ICD-10-CM

## 2016-06-18 NOTE — Progress Notes (Signed)
She presents today for follow-up for neuroma she states that it had gotten some better but now has regressed.  Objective: Vital signs are stable O alert and oriented 3 mild tenderness on palpation third interdigital space of the left foot.  Assessment: Neuroma third interdigital space recurrent left.  Plan: Injected dehydrated alcohol today first dose interdigital space left foot. Follow up with her 3 weeks

## 2016-07-01 DIAGNOSIS — M62830 Muscle spasm of back: Secondary | ICD-10-CM | POA: Diagnosis not present

## 2016-07-01 DIAGNOSIS — M9901 Segmental and somatic dysfunction of cervical region: Secondary | ICD-10-CM | POA: Diagnosis not present

## 2016-07-01 DIAGNOSIS — M9902 Segmental and somatic dysfunction of thoracic region: Secondary | ICD-10-CM | POA: Diagnosis not present

## 2016-07-01 DIAGNOSIS — M5033 Other cervical disc degeneration, cervicothoracic region: Secondary | ICD-10-CM | POA: Diagnosis not present

## 2016-07-09 ENCOUNTER — Ambulatory Visit (INDEPENDENT_AMBULATORY_CARE_PROVIDER_SITE_OTHER): Payer: Medicare Other | Admitting: Podiatry

## 2016-07-09 DIAGNOSIS — D3613 Benign neoplasm of peripheral nerves and autonomic nervous system of lower limb, including hip: Secondary | ICD-10-CM

## 2016-07-09 NOTE — Progress Notes (Signed)
She presents today for follow-up of her neuroma. She states that it's doing much better and very happy with the outcome after the one injection.  Objective: Vital signs are stable alert and oriented 3 and has a palpable Mulder's click to the third interdigital space of the left foot.  Assessment: Pain in limb secondary with neuroma third interdigital space left.  Plan: I reinjected the third interdigital space today with her second dose of dehydrated alcohol follow-up with her in 3 weeks

## 2016-07-25 ENCOUNTER — Other Ambulatory Visit: Payer: Self-pay | Admitting: Family Medicine

## 2016-07-25 DIAGNOSIS — Z1231 Encounter for screening mammogram for malignant neoplasm of breast: Secondary | ICD-10-CM

## 2016-07-25 DIAGNOSIS — Z853 Personal history of malignant neoplasm of breast: Secondary | ICD-10-CM

## 2016-07-29 ENCOUNTER — Encounter: Payer: Self-pay | Admitting: Family Medicine

## 2016-07-29 ENCOUNTER — Ambulatory Visit (INDEPENDENT_AMBULATORY_CARE_PROVIDER_SITE_OTHER): Payer: Medicare Other | Admitting: Family Medicine

## 2016-07-29 VITALS — BP 118/80 | HR 88 | Temp 98.3°F | Ht 65.0 in | Wt 220.0 lb

## 2016-07-29 DIAGNOSIS — J069 Acute upper respiratory infection, unspecified: Secondary | ICD-10-CM | POA: Diagnosis not present

## 2016-07-29 MED ORDER — HYDROCODONE-HOMATROPINE 5-1.5 MG/5ML PO SYRP: 5.0000 mL | ORAL_SOLUTION | Freq: Three times a day (TID) | ORAL | 0 refills | Status: DC | PRN

## 2016-07-29 NOTE — Patient Instructions (Signed)
Great to see you.   Continue with mucinex, drink plenty of water.  Hycodan as needed three times daily as needed for cough- this will make you sleepy.  Please do not drive after taking it.

## 2016-07-29 NOTE — Progress Notes (Signed)
SUBJECTIVE:  Darlene Lawrence is a 76 y.o. female pt of Dr. Diona Browner, new to me, who complains of coryza, congestion, dry cough and chills for 4 days. She denies a history of anorexia and chest pain and denies a history of asthma. Patient denies smoke cigarettes.  Has been taking Mucinex without much relief.  Cough is keeping her up at night.     Current Outpatient Prescriptions on File Prior to Visit  Medication Sig Dispense Refill  . Aspirin (ASPIR-81 PO) Take 1 tablet by mouth.    Marland Kitchen b complex vitamins tablet Take 1 tablet by mouth daily.    . Calcium Citrate-Vitamin D (CALCIUM CITRATE + PO) Take 1 tablet by mouth daily.    . Cholecalciferol (VITAMIN D3) 2000 UNITS TABS Take 1 capsule by mouth 2 (two) times daily.    . Coenzyme Q10 (COQ10) 100 MG CAPS Take 1 capsule by mouth daily.    . Magnesium 400 MG CAPS Take 1 capsule by mouth daily.    . Misc Natural Products (GLUCOSAMINE CHOND MSM FORMULA PO) Take 1,500 mg by mouth.    . Multiple Vitamin (MULTI VITAMIN DAILY PO) Take 1 tablet by mouth.    . Omega-3 Fatty Acids (FISH OIL) 1200 MG CAPS Take 1 capsule by mouth.    . rosuvastatin (CRESTOR) 10 MG tablet Take 10 mg by mouth. Take two times a week.    . triamterene-hydrochlorothiazide (MAXZIDE-25) 37.5-25 MG tablet TAKE 1/2 TABLET BY MOUTH EVERY DAY 45 tablet 3   No current facility-administered medications on file prior to visit.     Allergies  Allergen Reactions  . Cefixime     REACTION: Rash  . Levaquin [Levofloxacin] Other (See Comments)    Shoulder Bursitis  . Penicillins     REACTION: Rash    Past Medical History:  Diagnosis Date  . Abnormal Pap smear   . Arthritis   . breast cancer 2009   right  . DDD (degenerative disc disease), lumbar   . High cholesterol   . Hyperlipidemia   . Hypertension   . Meniscus tear    right  . RBBB     Past Surgical History:  Procedure Laterality Date  . BREAST SURGERY Right 2009   lumpectomy, no radiation, no chemo  .  DILATION AND CURETTAGE OF UTERUS  2012   Dr. Fermin Schwab  . HERNIA REPAIR     umbilical  . JOINT REPLACEMENT Left    hip  . 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 TUBAL LIGATION      Family History  Problem Relation Age of Onset  . Cancer Mother     breast and colon  . Hypertension Father   . Heart disease Father   . Thyroid disease Father   . Down syndrome Son   . Asthma Other     Social History   Social History  . Marital status: Married    Spouse name: N/A  . Number of children: 2  . Years of education: N/A   Occupational History  . 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 Smoking status: Never Smoker  . Smokeless tobacco: Never Used  . Alcohol use Yes     Comment: once a month  . Drug use: No  . Sexual activity: Yes    Partners: Male  Birth control/ protection: Post-menopausal   Other Topics Concern  . Not on file   Social History Narrative   Regular exercise--yes, curves 3 times a week   Diet-- fruits and veggies, water, no fast food       The PMH, PSH, Social History, Family History, Medications, and allergies have been reviewed in [facility], and have been updated if relevant.  OBJECTIVE: BP 118/80   Pulse 88   Temp 98.3 F (36.8 C)   Ht 5\' 5"  (1.651 m)   Wt 220 lb (99.8 kg)   SpO2 98%   BMI 36.61 kg/m   She appears well, vital signs are as noted. Ears normal.  Throat and pharynx normal.  Neck supple. No adenopathy in the neck. Nose is congested. Sinuses non tender. The chest is clear, without wheezes or rales.  ASSESSMENT:  viral upper respiratory illness  PLAN: Symptomatic therapy suggested: push fluids, rest and return office visit prn if symptoms persist or worsen. Lack of antibiotic effectiveness discussed with her. Call or return to clinic prn if these symptoms  worsen or fail to improve as anticipated.

## 2016-07-30 ENCOUNTER — Ambulatory Visit (INDEPENDENT_AMBULATORY_CARE_PROVIDER_SITE_OTHER): Payer: Medicare Other | Admitting: Podiatry

## 2016-07-30 DIAGNOSIS — D3613 Benign neoplasm of peripheral nerves and autonomic nervous system of lower limb, including hip: Secondary | ICD-10-CM | POA: Diagnosis not present

## 2016-07-30 NOTE — Progress Notes (Signed)
She presents today for follow-up of her neuroma third interdigital space of the left foot. She states it seems to be doing better but still painful.  Objective: Vital signs are stable she is alert and oriented 3. Pulses are palpable. Palpable Mulder's click third interdigital space of the left foot.  Assessment: Pain limb secondary to neuroma third interspace left improving.  Plan: Reinjected dehydrated alcohol again today we'll follow-up with her in 3 weeks.

## 2016-08-20 ENCOUNTER — Encounter: Payer: Self-pay | Admitting: Podiatry

## 2016-08-20 ENCOUNTER — Ambulatory Visit (INDEPENDENT_AMBULATORY_CARE_PROVIDER_SITE_OTHER): Payer: Medicare Other | Admitting: Podiatry

## 2016-08-20 DIAGNOSIS — D3613 Benign neoplasm of peripheral nerves and autonomic nervous system of lower limb, including hip: Secondary | ICD-10-CM | POA: Diagnosis not present

## 2016-08-20 NOTE — Progress Notes (Signed)
She presents today 85% improved from neuroma symptoms third digit left foot.  Objective: Vital signs are stable she is alert and oriented 3. Pulses are palpable. Mild tenderness on palpation third interdigital space of the left foot with a palpable Mulder's click.  Assessment: Neuroma resolving 85% left foot.  Plan: Injected another dose of dehydrated alcohol will follow up with her in 3-4 weeks. She will be leaving for [facility] the end of May for a short vacation.

## 2016-08-26 DIAGNOSIS — M9901 Segmental and somatic dysfunction of cervical region: Secondary | ICD-10-CM | POA: Diagnosis not present

## 2016-08-26 DIAGNOSIS — M62830 Muscle spasm of back: Secondary | ICD-10-CM | POA: Diagnosis not present

## 2016-08-26 DIAGNOSIS — M5033 Other cervical disc degeneration, cervicothoracic region: Secondary | ICD-10-CM | POA: Diagnosis not present

## 2016-08-26 DIAGNOSIS — M9902 Segmental and somatic dysfunction of thoracic region: Secondary | ICD-10-CM | POA: Diagnosis not present

## 2016-08-28 ENCOUNTER — Ambulatory Visit
Admission: RE | Admit: 2016-08-28 | Discharge: 2016-08-28 | Disposition: A | Discharge status: Home / Self Care | Payer: Medicare Other | Source: Ambulatory Visit | Attending: Family Medicine | Admitting: Family Medicine

## 2016-08-28 DIAGNOSIS — Z1231 Encounter for screening mammogram for malignant neoplasm of breast: Secondary | ICD-10-CM | POA: Diagnosis not present

## 2016-08-28 DIAGNOSIS — Z853 Personal history of malignant neoplasm of breast: Secondary | ICD-10-CM

## 2016-08-28 HISTORY — DX: Personal history of irradiation: Z92.3

## 2016-09-10 ENCOUNTER — Ambulatory Visit (INDEPENDENT_AMBULATORY_CARE_PROVIDER_SITE_OTHER): Payer: Medicare Other | Admitting: Podiatry

## 2016-09-10 ENCOUNTER — Encounter: Payer: Self-pay | Admitting: Podiatry

## 2016-09-10 DIAGNOSIS — D3613 Benign neoplasm of peripheral nerves and autonomic nervous system of lower limb, including hip: Secondary | ICD-10-CM | POA: Diagnosis not present

## 2016-09-10 NOTE — Progress Notes (Signed)
She presents today for follow-up of her neuroma. She states this seems to be doing better. Last time she was in it was 85% better she states it may be 8688% at this point.  Objective: Vital signs are stable she is alert and oriented 3. Pulses are palpable. She has pain on palpation third interdigital space of the left foot. Palpation is painful.  Assessment: Pain in limb secondary to neuroma third interspace left.  Plan: Injected third dose dehydrated alcohol. Follow-up with her in 3 weeks.

## 2016-09-12 ENCOUNTER — Encounter: Payer: Self-pay | Admitting: Family Medicine

## 2016-09-12 ENCOUNTER — Encounter: Payer: Self-pay | Admitting: *Deleted

## 2016-09-12 ENCOUNTER — Inpatient Hospital Stay
Admission: EM | Admit: 2016-09-12 | Discharge: 2016-09-22 | DRG: 280 | Disposition: A | Discharge status: Home Health | Payer: Medicare Other | Attending: Internal Medicine | Admitting: Internal Medicine

## 2016-09-12 ENCOUNTER — Emergency Department: Payer: Medicare Other

## 2016-09-12 ENCOUNTER — Ambulatory Visit (INDEPENDENT_AMBULATORY_CARE_PROVIDER_SITE_OTHER): Payer: Medicare Other | Admitting: Family Medicine

## 2016-09-12 ENCOUNTER — Emergency Department
Admit: 2016-09-12 | Discharge: 2016-09-12 | Disposition: A | Discharge status: Home / Self Care | Payer: Medicare Other | Attending: Cardiology | Admitting: Cardiology

## 2016-09-12 DIAGNOSIS — J9811 Atelectasis: Secondary | ICD-10-CM | POA: Diagnosis not present

## 2016-09-12 DIAGNOSIS — E876 Hypokalemia: Secondary | ICD-10-CM | POA: Diagnosis present

## 2016-09-12 DIAGNOSIS — Z8249 Family history of ischemic heart disease and other diseases of the circulatory system: Secondary | ICD-10-CM | POA: Diagnosis not present

## 2016-09-12 DIAGNOSIS — H9193 Unspecified hearing loss, bilateral: Secondary | ICD-10-CM | POA: Diagnosis present

## 2016-09-12 DIAGNOSIS — I700 Atherosclerosis of aorta: Secondary | ICD-10-CM | POA: Diagnosis not present

## 2016-09-12 DIAGNOSIS — I5032 Chronic diastolic (congestive) heart failure: Secondary | ICD-10-CM | POA: Diagnosis not present

## 2016-09-12 DIAGNOSIS — J189 Pneumonia, unspecified organism: Secondary | ICD-10-CM | POA: Insufficient documentation

## 2016-09-12 DIAGNOSIS — Z6838 Body mass index (BMI) 38.0-38.9, adult: Secondary | ICD-10-CM | POA: Diagnosis not present

## 2016-09-12 DIAGNOSIS — E785 Hyperlipidemia, unspecified: Secondary | ICD-10-CM | POA: Diagnosis present

## 2016-09-12 DIAGNOSIS — Z853 Personal history of malignant neoplasm of breast: Secondary | ICD-10-CM

## 2016-09-12 DIAGNOSIS — E669 Obesity, unspecified: Secondary | ICD-10-CM | POA: Diagnosis present

## 2016-09-12 DIAGNOSIS — E860 Dehydration: Secondary | ICD-10-CM | POA: Diagnosis present

## 2016-09-12 DIAGNOSIS — J181 Lobar pneumonia, unspecified organism: Secondary | ICD-10-CM

## 2016-09-12 DIAGNOSIS — Z881 Allergy status to other antibiotic agents status: Secondary | ICD-10-CM | POA: Diagnosis not present

## 2016-09-12 DIAGNOSIS — Z79899 Other long term (current) drug therapy: Secondary | ICD-10-CM

## 2016-09-12 DIAGNOSIS — I214 Non-ST elevation (NSTEMI) myocardial infarction: Principal | ICD-10-CM | POA: Diagnosis present

## 2016-09-12 DIAGNOSIS — R7989 Other specified abnormal findings of blood chemistry: Secondary | ICD-10-CM

## 2016-09-12 DIAGNOSIS — J9690 Respiratory failure, unspecified, unspecified whether with hypoxia or hypercapnia: Secondary | ICD-10-CM

## 2016-09-12 DIAGNOSIS — N183 Chronic kidney disease, stage 3 unspecified: Secondary | ICD-10-CM

## 2016-09-12 DIAGNOSIS — Z923 Personal history of irradiation: Secondary | ICD-10-CM | POA: Diagnosis not present

## 2016-09-12 DIAGNOSIS — F419 Anxiety disorder, unspecified: Secondary | ICD-10-CM | POA: Diagnosis present

## 2016-09-12 DIAGNOSIS — Z96651 Presence of right artificial knee joint: Secondary | ICD-10-CM | POA: Diagnosis present

## 2016-09-12 DIAGNOSIS — E871 Hypo-osmolality and hyponatremia: Secondary | ICD-10-CM | POA: Diagnosis present

## 2016-09-12 DIAGNOSIS — J9601 Acute respiratory failure with hypoxia: Secondary | ICD-10-CM | POA: Diagnosis not present

## 2016-09-12 DIAGNOSIS — R05 Cough: Secondary | ICD-10-CM | POA: Diagnosis not present

## 2016-09-12 DIAGNOSIS — I4510 Unspecified right bundle-branch block: Secondary | ICD-10-CM | POA: Diagnosis present

## 2016-09-12 DIAGNOSIS — I10 Essential (primary) hypertension: Secondary | ICD-10-CM

## 2016-09-12 DIAGNOSIS — N179 Acute kidney failure, unspecified: Secondary | ICD-10-CM | POA: Diagnosis present

## 2016-09-12 DIAGNOSIS — R0602 Shortness of breath: Secondary | ICD-10-CM | POA: Diagnosis not present

## 2016-09-12 DIAGNOSIS — Z96642 Presence of left artificial hip joint: Secondary | ICD-10-CM | POA: Diagnosis present

## 2016-09-12 DIAGNOSIS — E7800 Pure hypercholesterolemia, unspecified: Secondary | ICD-10-CM | POA: Diagnosis present

## 2016-09-12 DIAGNOSIS — R778 Other specified abnormalities of plasma proteins: Secondary | ICD-10-CM

## 2016-09-12 DIAGNOSIS — R0600 Dyspnea, unspecified: Secondary | ICD-10-CM

## 2016-09-12 DIAGNOSIS — I2699 Other pulmonary embolism without acute cor pulmonale: Secondary | ICD-10-CM

## 2016-09-12 DIAGNOSIS — I130 Hypertensive heart and chronic kidney disease with heart failure and stage 1 through stage 4 chronic kidney disease, or unspecified chronic kidney disease: Secondary | ICD-10-CM | POA: Diagnosis present

## 2016-09-12 DIAGNOSIS — Z803 Family history of malignant neoplasm of breast: Secondary | ICD-10-CM

## 2016-09-12 DIAGNOSIS — Z880 Allergy status to penicillin: Secondary | ICD-10-CM

## 2016-09-12 DIAGNOSIS — Z7982 Long term (current) use of aspirin: Secondary | ICD-10-CM

## 2016-09-12 DIAGNOSIS — R748 Abnormal levels of other serum enzymes: Secondary | ICD-10-CM | POA: Diagnosis not present

## 2016-09-12 DIAGNOSIS — I5021 Acute systolic (congestive) heart failure: Secondary | ICD-10-CM | POA: Diagnosis present

## 2016-09-12 HISTORY — DX: Malignant (primary) neoplasm, unspecified: C80.1

## 2016-09-12 LAB — CBC WITH DIFFERENTIAL/PLATELET
BASOS PCT: 0 %
Basophils Absolute: 0 10*3/uL (ref 0–0.1)
Eosinophils Absolute: 0 10*3/uL (ref 0–0.7)
Eosinophils Relative: 0 %
HEMATOCRIT: 43 % (ref 35.0–47.0)
Hemoglobin: 14.9 g/dL (ref 12.0–16.0)
LYMPHS PCT: 11 %
Lymphs Abs: 1 10*3/uL (ref 1.0–3.6)
MCH: 34 pg (ref 26.0–34.0)
MCHC: 34.6 g/dL (ref 32.0–36.0)
MCV: 98.1 fL (ref 80.0–100.0)
MONO ABS: 0.5 10*3/uL (ref 0.2–0.9)
MONOS PCT: 5 %
NEUTROS ABS: 8 10*3/uL — AB (ref 1.4–6.5)
Neutrophils Relative %: 84 %
PLATELETS: 146 10*3/uL — AB (ref 150–440)
RBC: 4.38 MIL/uL (ref 3.80–5.20)
RDW: 14.2 % (ref 11.5–14.5)
WBC: 9.5 10*3/uL (ref 3.6–11.0)

## 2016-09-12 LAB — COMPREHENSIVE METABOLIC PANEL
ALBUMIN: 3.5 g/dL (ref 3.5–5.0)
ALK PHOS: 53 U/L (ref 38–126)
ALT: 41 U/L (ref 14–54)
ANION GAP: 14 (ref 5–15)
AST: 121 U/L — AB (ref 15–41)
BILIRUBIN TOTAL: 1 mg/dL (ref 0.3–1.2)
BUN: 33 mg/dL — AB (ref 6–20)
CALCIUM: 8.6 mg/dL — AB (ref 8.9–10.3)
CO2: 24 mmol/L (ref 22–32)
Chloride: 96 mmol/L — ABNORMAL LOW (ref 101–111)
Creatinine, Ser: 1.79 mg/dL — ABNORMAL HIGH (ref 0.44–1.00)
GFR calc Af Amer: 31 mL/min — ABNORMAL LOW (ref >60)
GFR calc non Af Amer: 27 mL/min — ABNORMAL LOW (ref >60)
GLUCOSE: 179 mg/dL — AB (ref 65–99)
Potassium: 3.6 mmol/L (ref 3.5–5.1)
SODIUM: 134 mmol/L — AB (ref 135–145)
Total Protein: 8.3 g/dL — ABNORMAL HIGH (ref 6.5–8.1)

## 2016-09-12 LAB — URINALYSIS, COMPLETE (UACMP) WITH MICROSCOPIC
Bilirubin Urine: NEGATIVE
Glucose, UA: NEGATIVE mg/dL
KETONES UR: NEGATIVE mg/dL
Nitrite: NEGATIVE
Protein, ur: 100 mg/dL — AB
Specific Gravity, Urine: 1.02 (ref 1.005–1.030)
pH: 5 (ref 5.0–8.0)

## 2016-09-12 LAB — ECHOCARDIOGRAM COMPLETE
Height: 64 in
Weight: 3408 oz

## 2016-09-12 LAB — APTT: APTT: 33 s (ref 24–36)

## 2016-09-12 LAB — TROPONIN I
TROPONIN I: 47.03 ng/mL — AB (ref <0.03)
Troponin I: 46.85 ng/mL (ref <0.03)
Troponin I: 31.61 ng/mL (ref <0.03)

## 2016-09-12 LAB — PROTIME-INR
INR: 1.08
PROTHROMBIN TIME: 14 s (ref 11.4–15.2)

## 2016-09-12 LAB — LACTIC ACID, PLASMA
LACTIC ACID, VENOUS: 2.5 mmol/L — AB (ref 0.5–1.9)
LACTIC ACID, VENOUS: 1.7 mmol/L (ref 0.5–1.9)

## 2016-09-12 LAB — HEPARIN LEVEL (UNFRACTIONATED): Heparin Unfractionated: 0.43 IU/mL (ref 0.30–0.70)

## 2016-09-12 LAB — CK: Total CK: 995 U/L — ABNORMAL HIGH (ref 38–234)

## 2016-09-12 LAB — BRAIN NATRIURETIC PEPTIDE: B Natriuretic Peptide: 1297 pg/mL — ABNORMAL HIGH (ref 0.0–100.0)

## 2016-09-12 MED ORDER — ACETAMINOPHEN 325 MG PO TABS
650.0000 mg | ORAL_TABLET | Freq: Four times a day (QID) | ORAL | Status: DC | PRN
  Administered 2016-09-13 (×2): 650 mg via ORAL
  Filled 2016-09-12 (×2): qty 2

## 2016-09-12 MED ORDER — BUDESONIDE 0.5 MG/2ML IN SUSP
0.5000 mg | Freq: Two times a day (BID) | RESPIRATORY_TRACT | Status: DC
  Administered 2016-09-12 – 2016-09-14 (×4): 0.5 mg via RESPIRATORY_TRACT
  Filled 2016-09-12 (×5): qty 2

## 2016-09-12 MED ORDER — OMEGA-3-ACID ETHYL ESTERS 1 G PO CAPS
1.0000 g | ORAL_CAPSULE | Freq: Two times a day (BID) | ORAL | Status: DC
  Administered 2016-09-12 – 2016-09-22 (×20): 1 g via ORAL
  Filled 2016-09-12 (×20): qty 1

## 2016-09-12 MED ORDER — ENOXAPARIN SODIUM 40 MG/0.4ML ~~LOC~~ SOLN: 40.0000 mg | SUBCUTANEOUS | Status: DC

## 2016-09-12 MED ORDER — VITAMIN D 1000 UNITS PO TABS
2000.0000 [IU] | ORAL_TABLET | Freq: Two times a day (BID) | ORAL | Status: DC
  Administered 2016-09-13 – 2016-09-22 (×18): 2000 [IU] via ORAL
  Filled 2016-09-12 (×18): qty 2

## 2016-09-12 MED ORDER — NITROGLYCERIN 0.4 MG SL SUBL: 0.4000 mg | SUBLINGUAL_TABLET | SUBLINGUAL | Status: DC | PRN

## 2016-09-12 MED ORDER — ADULT MULTIVITAMIN W/MINERALS CH
1.0000 | ORAL_TABLET | Freq: Every morning | ORAL | Status: DC
  Administered 2016-09-12 – 2016-09-22 (×11): 1 via ORAL
  Filled 2016-09-12 (×11): qty 1

## 2016-09-12 MED ORDER — METOPROLOL TARTRATE 25 MG PO TABS
25.0000 mg | ORAL_TABLET | Freq: Two times a day (BID) | ORAL | Status: DC
  Administered 2016-09-12 – 2016-09-22 (×16): 25 mg via ORAL
  Filled 2016-09-12 (×17): qty 1

## 2016-09-12 MED ORDER — DEXTROSE 5 % IV SOLN
2.0000 g | Freq: Once | INTRAVENOUS | Status: AC
  Administered 2016-09-12: 2 g via INTRAVENOUS
  Filled 2016-09-12: qty 2

## 2016-09-12 MED ORDER — HEPARIN (PORCINE) IN NACL 100-0.45 UNIT/ML-% IJ SOLN
1200.0000 [IU]/h | INTRAMUSCULAR | Status: DC
  Administered 2016-09-12: 950 [IU]/h via INTRAVENOUS
  Administered 2016-09-13 – 2016-09-16 (×4): 1100 [IU]/h via INTRAVENOUS
  Filled 2016-09-12 (×7): qty 250

## 2016-09-12 MED ORDER — HEPARIN BOLUS VIA INFUSION
4000.0000 [IU] | Freq: Once | INTRAVENOUS | Status: AC
  Administered 2016-09-12: 4000 [IU] via INTRAVENOUS
  Filled 2016-09-12: qty 4000

## 2016-09-12 MED ORDER — IPRATROPIUM-ALBUTEROL 0.5-2.5 (3) MG/3ML IN SOLN
3.0000 mL | Freq: Four times a day (QID) | RESPIRATORY_TRACT | Status: DC
  Administered 2016-09-12 – 2016-09-19 (×27): 3 mL via RESPIRATORY_TRACT
  Filled 2016-09-12 (×26): qty 3

## 2016-09-12 MED ORDER — DOXYCYCLINE HYCLATE 100 MG IV SOLR
100.0000 mg | Freq: Two times a day (BID) | INTRAVENOUS | Status: DC
  Administered 2016-09-12 – 2016-09-14 (×6): 100 mg via INTRAVENOUS
  Filled 2016-09-12 (×9): qty 100

## 2016-09-12 MED ORDER — SODIUM CHLORIDE 0.9 % IV SOLN
INTRAVENOUS | Status: DC
  Administered 2016-09-12 – 2016-09-15 (×4): via INTRAVENOUS

## 2016-09-12 MED ORDER — ONDANSETRON HCL 4 MG/2ML IJ SOLN
4.0000 mg | Freq: Once | INTRAMUSCULAR | Status: AC
  Administered 2016-09-12: 4 mg via INTRAVENOUS
  Filled 2016-09-12: qty 2

## 2016-09-12 MED ORDER — ASPIRIN 81 MG PO CHEW
324.0000 mg | CHEWABLE_TABLET | Freq: Once | ORAL | Status: AC
  Administered 2016-09-12: 324 mg via ORAL
  Filled 2016-09-12: qty 4

## 2016-09-12 MED ORDER — DEXTROSE 5 % IV SOLN
2.0000 g | Freq: Two times a day (BID) | INTRAVENOUS | Status: DC
  Administered 2016-09-12: 2 g via INTRAVENOUS
  Filled 2016-09-12 (×4): qty 2

## 2016-09-12 MED ORDER — VANCOMYCIN HCL IN DEXTROSE 1-5 GM/200ML-% IV SOLN
1000.0000 mg | Freq: Once | INTRAVENOUS | Status: AC
Start: 2016-09-12 — End: 2016-09-12
  Administered 2016-09-12: 1000 mg via INTRAVENOUS
  Filled 2016-09-12: qty 200

## 2016-09-12 MED ORDER — ASPIRIN 325 MG PO TABS
325.0000 mg | ORAL_TABLET | Freq: Every day | ORAL | Status: DC
  Administered 2016-09-13 – 2016-09-22 (×10): 325 mg via ORAL
  Filled 2016-09-12 (×10): qty 1

## 2016-09-12 MED ORDER — METHYLPREDNISOLONE SODIUM SUCC 40 MG IJ SOLR
40.0000 mg | Freq: Every day | INTRAMUSCULAR | Status: DC
  Administered 2016-09-12: 40 mg via INTRAVENOUS
  Filled 2016-09-12: qty 1

## 2016-09-12 MED ORDER — ROSUVASTATIN CALCIUM 10 MG PO TABS
10.0000 mg | ORAL_TABLET | ORAL | Status: DC
  Administered 2016-09-15 – 2016-09-22 (×3): 10 mg via ORAL
  Filled 2016-09-12 (×2): qty 1
  Filled 2016-09-12: qty 2

## 2016-09-12 MED ORDER — SODIUM CHLORIDE 0.9 % IV BOLUS (SEPSIS)
1000.0000 mL | Freq: Once | INTRAVENOUS | Status: AC
  Administered 2016-09-12: 1000 mL via INTRAVENOUS

## 2016-09-12 NOTE — ED Notes (Signed)
Echo tech at bedside completing echo.

## 2016-09-12 NOTE — ED Notes (Signed)
Date and time results received: 09/12/16 1038   Test: Lactic Acid Critical Value: 2.5  Name of Provider Notified: schaevitz  Orders Received? Or Actions Taken?: MD notified

## 2016-09-12 NOTE — ED Notes (Signed)
ED Provider at bedside. 

## 2016-09-12 NOTE — Assessment & Plan Note (Signed)
Evidence of dehydration on exam and vitals.  Recommend ER visit for IV fluids, electrolyte evaluation, renal function and  possible over night stay.

## 2016-09-12 NOTE — ED Notes (Signed)
Called [facility] and called code sepsis to [place]

## 2016-09-12 NOTE — Progress Notes (Signed)
Pharmacy Antibiotic Note  Darlene Lawrence is a 76 y.o. female admitted on 09/12/2016 with  pneumonia.  Pharmacy has been consulted for Aztreonam dosing. Patient is also receiving doxycycline.   Plan: Will start patient on aztreonam 2g Iv every 12 hours.   Height: 5\' 4"  (162.6 cm) Weight: 213 lb (96.6 kg) IBW/kg (Calculated) : 54.7  Temp (24hrs), Avg:100.1 F (37.8 C), Min:98 F (36.7 C), Max:102.1 F (38.9 C)   Recent Labs Lab 09/12/16 0951  WBC 9.5  CREATININE 1.79*  LATICACIDVEN 2.5*    Estimated Creatinine Clearance: 31.6 mL/min (A) (by C-G formula based on SCr of 1.79 mg/dL (H)).    Allergies  Allergen Reactions  . Cefixime     REACTION: Rash  . Levaquin [Levofloxacin] Other (See Comments)    Shoulder Bursitis  . Penicillins     REACTION: Rash    Antimicrobials this admission: 5/4 aztreonam >>  5/4 doxycycline >>   Dose adjustments this admission:  Microbiology results:  Thank you for allowing pharmacy to be a part of this patient's care.  Pernell Dupre, PharmD, BCPS Clinical Pharmacist 09/12/2016 2:08 PM

## 2016-09-12 NOTE — Progress Notes (Signed)
Pre visit review using our clinic review tool, if applicable. No additional management support is needed unless otherwise documented below in the visit note. 

## 2016-09-12 NOTE — Assessment & Plan Note (Signed)
Need eval  With CXR, but abnormal lung sounds at left lower lung. Given low BP and confusion.. Concern for sepsis.. Needs sepsis eval. Likely needs IV antibiotics (not tolerating po to be able to keep down oral antibitoics)

## 2016-09-12 NOTE — ED Notes (Addendum)
Date and time results received: 09/12/16 1030   Test: troponin Critical Value: 46.85  Name of Provider Notified: Schaevitz  Orders Received? Or Actions Taken?: MD Notified. Fluids to be stopped.

## 2016-09-12 NOTE — Consult Note (Signed)
[facility]  CARDIOLOGY CONSULT NOTE  Patient ID: Darlene Lawrence MRN: [id number] DOB/AGE: [birth year] 76 y.o.  Admit date: 09/12/2016 Referring Physician Dr. Leslye Peer Primary Physician   Primary Cardiologist   Reason for Consultation abnormal troponin  HPI: Pt is  76 yo female with no prior cardiac history who presented to her pcp today with complaints of approximately 4-5 days of feeling poorly wth weakness, nausea, diarrhea and vomiting with relative anorexia. She had no chest pian or significant sob. In the er her ekg showed nsr with st depression in the anterior leads with no reciprocal changes. She again had no chest pain and was hemodynaically stable. Troponin was drawn per protocol and was 43. Echo done urgently revealed ef 40-45% with probable antero and inferbaslar hypokinesis with the anteroapex squeezing normally. No effusion. Electrolytes were normal. CXR revealed lll pneumonia. Pt had low grade fever earlier this week. She denies tobacco abuse.   Review of Systems  Constitutional: Positive for fever, malaise/fatigue and weight loss.  HENT: Negative.   Eyes: Negative.   Respiratory: Positive for shortness of breath.   Cardiovascular: Negative.   Gastrointestinal: Negative.   Genitourinary: Negative.   Musculoskeletal: Negative.   Skin: Negative.   Neurological: Positive for weakness.  Endo/Heme/Allergies: Negative.   Psychiatric/Behavioral: Negative.     Past Medical History:  Diagnosis Date  . Abnormal Pap smear   . Arthritis   . breast cancer 2009   right  . DDD (degenerative disc disease), lumbar   . High cholesterol   . Hyperlipidemia   . Hypertension   . Meniscus tear    right  . Personal history of radiation therapy   . RBBB     Family History  Problem Relation Age of Onset  . Cancer Mother     breast and colon  . Breast cancer Mother   . Hypertension Father   . Heart disease Father   . Thyroid  disease Father   . Down syndrome Son   . Asthma Other     Social History   Social History  . Marital status: Married    Spouse name: N/A  . Number of children: 2  . Years of education: N/A   Occupational History  . 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 Smoking status: Never Smoker  . Smokeless tobacco: Never Used  . Alcohol use Yes     Comment: once a month  . Drug use: No  . Sexual activity: Yes    Partners: Male    Birth control/ protection: Post-menopausal   Other Topics Concern  . Not on file   Social History Narrative   Regular exercise--yes, curves 3 times a week   Diet-- fruits and veggies, water, no fast food        Past Surgical History:  Procedure Laterality Date  . BREAST BIOPSY Right 08/18/2007  . BREAST LUMPECTOMY Right 2009  . BREAST SURGERY Right 2009   lumpectomy, no radiation, no chemo  . DILATION AND CURETTAGE OF UTERUS  2012   Dr. Fermin Schwab  . HERNIA REPAIR     umbilical  . JOINT REPLACEMENT Left    hip  . TOTAL HIP ARTHROPLASTY Left   . 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 TUBAL LIGATION  Prescriptions Prior to Admission  Medication Sig Dispense Refill Last Dose  . Aspirin (ASPIR-81 PO) Take 1 tablet by mouth.   Past Week at Unknown time  . b complex vitamins tablet Take 1 tablet by mouth daily.   Past Week at Unknown time  . Calcium Citrate-Vitamin D (CALCIUM CITRATE + PO) Take 1 tablet by mouth daily.   Past Week at Unknown time  . Cholecalciferol (VITAMIN D3) 2000 UNITS TABS Take 1 capsule by mouth 2 (two) times daily.   Past Week at Unknown time  . Coenzyme Q10 (COQ10) 100 MG CAPS Take 1 capsule by mouth daily.   Past Week at Unknown time  . Misc Natural Products (GLUCOSAMINE CHOND MSM FORMULA PO) Take 1,500 mg by mouth.   Past Week at Unknown time   . Multiple Vitamin (MULTI VITAMIN DAILY PO) Take 1 tablet by mouth.   Past Week at Unknown time  . Omega-3 Fatty Acids (FISH OIL) 1200 MG CAPS Take 1 capsule by mouth.   Past Week at Unknown time  . rosuvastatin (CRESTOR) 10 MG tablet Take 10 mg by mouth 2 (two) times a week. Monday, Thursday.   Past Week at Unknown time  . triamterene-hydrochlorothiazide (MAXZIDE-25) 37.5-25 MG tablet TAKE 1/2 TABLET BY MOUTH EVERY DAY 45 tablet 3 Past Week at Unknown time    Physical Exam: Blood pressure (!) 98/58, pulse 70, temperature 99.5 F (37.5 C), temperature source Oral, resp. rate 16, height 5\' 4"  (1.626 m), weight 96.6 kg (213 lb), SpO2 93 %.   Wt Readings from Last 1 Encounters:  09/12/16 96.6 kg (213 lb)     General appearance: alert and cooperative Resp: rhonchi LLL Chest wall: no tenderness Cardio: regular rate and rhythm Extremities: extremities normal, atraumatic, no cyanosis or edema Pulses: 2+ and symmetric Neurologic: Grossly normal  Labs:   Lab Results  Component Value Date   WBC 9.5 09/12/2016   HGB 14.9 09/12/2016   HCT 43.0 09/12/2016   MCV 98.1 09/12/2016   PLT 146 (L) 09/12/2016    Recent Labs Lab 09/12/16 0951  NA 134*  K 3.6  CL 96*  CO2 24  BUN 33*  CREATININE 1.79*  CALCIUM 8.6*  PROT 8.3*  BILITOT 1.0  ALKPHOS 53  ALT 41  AST 121*  GLUCOSE 179*   Lab Results  Component Value Date   CKTOTAL 995 (H) 09/12/2016   TROPONINI 31.61 ([place]) 09/12/2016      Radiology: lll pneumonia EKG: nsr with st depression in the anterior leads  ASSESSMENT AND PLAN:  Pt with 4 day history of weakness, nausea, diarrhea, and anorexia who presented to the er. She denied any chest pain but has felt poorly for several days.  Has lll pneumonia on cxr but serum troponin was elevated at 43. Subsequant somewhat lower suggesting possible nstemi 2-3 days ago. Echo shows basal hypokinesis. Will treat with abx and hydrate to improve h, er acute onchronic renal insuffiency. Will  add heparin and continue with asa. Will add high intensity statin, beta blockers and proceed with cath on Monday, earlier if she becomes unstable.  Signed: Teodoro Spray MD, [facility] 09/12/2016, 9:32 PM

## 2016-09-12 NOTE — Progress Notes (Addendum)
ANTICOAGULATION CONSULT NOTE - Initial Consult  Pharmacy Consult for heparin bolus and drip Indication: chest pain/ACS  Allergies  Allergen Reactions  . Cefixime     REACTION: Rash  . Levaquin [Levofloxacin] Other (See Comments)    Shoulder Bursitis  . Penicillins     REACTION: Rash    Patient Measurements: Height: 5\' 4"  (162.6 cm) Weight: 213 lb (96.6 kg) IBW/kg (Calculated) : 54.7 Heparin Dosing Weight: 76.8 kg  Vital Signs: Temp: 98.7 F (37.1 C) (05/04 1722) Temp Source: Oral (05/04 1722) BP: 111/68 (05/04 1332) Pulse Rate: 89 (05/04 1332)  Labs:  Recent Labs  09/12/16 0951 09/12/16 1353  HGB 14.9  --   HCT 43.0  --   PLT 146*  --   APTT  --  33  LABPROT  --  14.0  INR  --  1.08  CREATININE 1.79*  --   CKTOTAL  --  995*  TROPONINI 46.85* 47.03*    Estimated Creatinine Clearance: 31.6 mL/min (A) (by C-G formula based on SCr of 1.79 mg/dL (H)).   Medical History: Past Medical History:  Diagnosis Date  . Abnormal Pap smear   . Arthritis   . breast cancer 2009   right  . DDD (degenerative disc disease), lumbar   . High cholesterol   . Hyperlipidemia   . Hypertension   . Meniscus tear    right  . Personal history of radiation therapy   . RBBB     Medications:  Scheduled:  . [START ON 09/13/2016] aspirin  325 mg Oral Daily  . budesonide (PULMICORT) nebulizer solution  0.5 mg Nebulization BID  . [START ON 09/13/2016] cholecalciferol  2,000 Units Oral BID  . ipratropium-albuterol  3 mL Nebulization Q6H  . methylPREDNISolone (SOLU-MEDROL) injection  40 mg Intravenous Daily  . metoprolol tartrate  25 mg Oral BID  . multivitamin with minerals  1 tablet Oral q morning - 10a  . omega-3 acid ethyl esters  1 g Oral BID  . [START ON 09/15/2016] rosuvastatin  10 mg Oral Once per day on Mon Thu   Infusions:  . sodium chloride 75 mL/hr at 09/12/16 1511  . aztreonam    . doxycycline (VIBRAMYCIN) IV Stopped (09/12/16 1801)  . heparin 950 Units/hr (09/12/16  1834)    Assessment: Pharmacy consulted to dose heparin bolus and drip in this 57 yoF with ACS. Per med rec, no PTA anticoag, baseline labs ordered. Troponin = 47 and 31.6  Goal of Therapy:  Heparin level 0.3-0.7 units/ml Monitor platelets by anticoagulation protocol: Yes   Plan:  Give 4000 units bolus x 1 Start heparin infusion at 950 units/hr Check anti-Xa level in 8 hours and daily while on heparin Continue to monitor H&H and platelets   5/4 22:30 heparin level 0.43. Continue current regimen, Recheck with AM labs to confirm.  5/5 AM heparin level 0.26. 1200 unit bolus and increase rate to 1100 units/hr. Recheck in 8 hours.   Sim Boast, PharmD, BCPS  09/13/16 5:13 AM

## 2016-09-12 NOTE — Assessment & Plan Note (Signed)
Hold diuretic.

## 2016-09-12 NOTE — ED Notes (Signed)
Pts O2 sats dropping into upper 80's, pt placed on 2L [place] and MD made aware.

## 2016-09-12 NOTE — ED Triage Notes (Signed)
Pt sent by MD for possible dehydration related to vomiting since Monday, pt complains of cough denies pain

## 2016-09-12 NOTE — H&P (Signed)
[facility] [place] NAME: Darlene Lawrence    MR#:  [id number]  DATE OF BIRTH:  04-24-1941  DATE OF ADMISSION:  09/12/2016  PRIMARY CARE PHYSICIAN: Darlene Lofts, MD   REQUESTING/REFERRING PHYSICIAN: Dr Darlene Lawrence  CHIEF COMPLAINT:   Chief Complaint  Patient presents with  . Emesis  . Cough    HISTORY OF PRESENT ILLNESS:  Darlene Lawrence  is a 76 y.o. female presents with cough starting on Monday. She has been more lethargic and she slept all day on Wednesday and Thursday. She had some nausea vomiting and diarrhea that occurred on Wednesday and Thursday. She's had some disorientation. She's not eating or drinking. No complaints of chest pain or shortness of breath. Her cough is nonproductive. She saw her primary care physician today and thought she had pneumonia and sent into the ER for further evaluation. In the ER she was found to have a left lower lobe pneumonia and an elevated troponin of 46.85. Patient was seen urgently by cardiology. The ER physician called Dr. Ubaldo Lawrence to see the patient. Dr. Ubaldo Lawrence ordered an echocardiogram which is underway. Then he will decide on heparin drip or not. At this point cardiac catheterization will be on Monday.  PAST MEDICAL HISTORY:   Past Medical History:  Diagnosis Date  . Abnormal Pap smear   . Arthritis   . breast cancer 2009   right  . DDD (degenerative disc disease), lumbar   . High cholesterol   . Hyperlipidemia   . Hypertension   . Meniscus tear    right  . Personal history of radiation therapy   . RBBB     PAST SURGICAL HISTORY:   Past Surgical History:  Procedure Laterality Date  . BREAST BIOPSY Right 08/18/2007  . BREAST LUMPECTOMY Right 2009  . BREAST SURGERY Right 2009   lumpectomy, no radiation, no chemo  . DILATION AND CURETTAGE OF UTERUS  2012   Dr. Fermin Lawrence  . HERNIA REPAIR     umbilical  . JOINT REPLACEMENT Left    hip  . TOTAL HIP ARTHROPLASTY Left   .  TOTAL KNEE ARTHROPLASTY Right 06/06/2013   Procedure: RIGHT TOTAL KNEE ARTHROPLASTY;  Surgeon: Darlene Alf, MD;  Location: [facility];  Service: Orthopedics;  Laterality: Right;  . TUBAL LIGATION      SOCIAL HISTORY:   Social History  Substance Use Topics  . Smoking status: Never Smoker  . Smokeless tobacco: Never Used  . Alcohol use Yes     Comment: once a month    FAMILY HISTORY:   Family History  Problem Relation Age of Onset  . Cancer Mother     breast and colon  . Breast cancer Mother   . Hypertension Father   . Heart disease Father   . Thyroid disease Father   . Down syndrome Son   . Asthma Other     DRUG ALLERGIES:   Allergies  Allergen Reactions  . Cefixime     REACTION: Rash  . Levaquin [Levofloxacin] Other (See Comments)    Shoulder Bursitis  . Penicillins     REACTION: Rash    REVIEW OF SYSTEMS:  CONSTITUTIONAL: Positive for fever and chills. Positive for fatigue and weakness. Positive for weight loss EYES: No blurred or double vision. Wears glasses. EARS, NOSE, AND THROAT: No tinnitus or ear pain. Positive for sore throat. Decreased hearing RESPIRATORY: Positive for dry cough. No shortness of breath, wheezing or hemoptysis.  CARDIOVASCULAR:  No chest pain, orthopnea, edema.  GASTROINTESTINAL: Positive for nausea, vomiting, diarrhea. No abdominal pain. No blood in bowel movements. No blood in the vomit. GENITOURINARY: No dysuria, hematuria.  ENDOCRINE: No polyuria, nocturia,  HEMATOLOGY: No anemia, easy bruising or bleeding SKIN: No rash or lesion. MUSCULOSKELETAL: No joint pain or arthritis.   NEUROLOGIC: No tingling, numbness, weakness.  PSYCHIATRY: No anxiety or depression.   MEDICATIONS AT HOME:   Prior to Admission medications   Medication Sig Start Date End Date Taking? Authorizing Provider  Aspirin (ASPIR-81 PO) Take 1 tablet by mouth.   Yes Historical Provider, MD  b complex vitamins tablet Take 1 tablet by mouth daily.   Yes Historical  Provider, MD  Calcium Citrate-Vitamin D (CALCIUM CITRATE + PO) Take 1 tablet by mouth daily.   Yes Historical Provider, MD  Cholecalciferol (VITAMIN D3) 2000 UNITS TABS Take 1 capsule by mouth 2 (two) times daily.   Yes Historical Provider, MD  Coenzyme Q10 (COQ10) 100 MG CAPS Take 1 capsule by mouth daily.   Yes Historical Provider, MD  Misc Natural Products (GLUCOSAMINE CHOND MSM FORMULA PO) Take 1,500 mg by mouth.   Yes Historical Provider, MD  Multiple Vitamin (MULTI VITAMIN DAILY PO) Take 1 tablet by mouth.   Yes Historical Provider, MD  Omega-3 Fatty Acids (FISH OIL) 1200 MG CAPS Take 1 capsule by mouth.   Yes Historical Provider, MD  rosuvastatin (CRESTOR) 10 MG tablet Take 10 mg by mouth 2 (two) times a week. Monday, Thursday. 11/05/10  Yes Darlene E Diona Browner, MD  triamterene-hydrochlorothiazide (MAXZIDE-25) 37.5-25 MG tablet TAKE 1/2 TABLET BY MOUTH EVERY DAY 12/07/15  Yes Darlene E Bedsole, MD      VITAL SIGNS:  Blood pressure (!) 128/43, pulse 87, temperature 98 F (36.7 C), temperature source Oral, resp. rate (!) 25, height 5\' 4"  (1.626 m), weight 96.6 kg (213 lb), SpO2 93 %.  PHYSICAL EXAMINATION:  GENERAL:  76 y.o.-year-old patient lying in the bed with no acute distress.  EYES: Pupils equal, round, reactive to light and accommodation. No scleral icterus. Extraocular muscles intact.  HEENT: Head atraumatic, normocephalic. Oropharynx and nasopharynx clear.  NECK:  Supple, no jugular venous distention. No thyroid enlargement, no tenderness.  LUNGS: Decreased breath sounds bilaterally, no wheezing, rales. Positive for rhonchi at bilateral bases. No use of accessory muscles of respiration.  CARDIOVASCULAR: S1, S2 normal. No murmurs, rubs, or gallops.  ABDOMEN: Soft, nontender, nondistended. Bowel sounds present. No organomegaly or mass.  EXTREMITIES: No pedal edema, cyanosis, or clubbing.  NEUROLOGIC: Cranial nerves II through XII are intact. Muscle strength 5/5 in all extremities. Sensation  intact. Gait not checked.  PSYCHIATRIC: The patient is alert and oriented x 3.  SKIN: No rash, lesion, or ulcer.   LABORATORY PANEL:   CBC  Recent Labs Lab 09/12/16 0951  WBC 9.5  HGB 14.9  HCT 43.0  PLT 146*   ------------------------------------------------------------------------------------------------------------------  Chemistries   Recent Labs Lab 09/12/16 0951  NA 134*  K 3.6  CL 96*  CO2 24  GLUCOSE 179*  BUN 33*  CREATININE 1.79*  CALCIUM 8.6*  AST 121*  ALT 41  ALKPHOS 53  BILITOT 1.0   ------------------------------------------------------------------------------------------------------------------  Cardiac Enzymes  Recent Labs Lab 09/12/16 0951  TROPONINI 46.85*   ------------------------------------------------------------------------------------------------------------------  RADIOLOGY:  Dg Chest 2 View  Addendum Date: 09/12/2016   ADDENDUM REPORT: 09/12/2016 11:00 ADDENDUM: Potentially this could represent a Hampton's hump sign which would suggest the possibility of pulmonary embolism. These results were called by telephone  at the time of interpretation on 09/12/2016 at 11:00 am to Dr. Larae Grooms , who verbally acknowledged these results. Electronically Signed   By: Marijo Conception, M.D.   On: 09/12/2016 11:00   Result Date: 09/12/2016 CLINICAL DATA:  Cough. EXAM: CHEST  2 VIEW COMPARISON:  Radiographs of May 31, 2014. FINDINGS: The heart size and mediastinal contours are within normal limits. No pneumothorax or pleural effusion is noted. Right lung is clear. Interval development of left lower lobe airspace opacity is noted consistent with pneumonia. The visualized skeletal structures are unremarkable. IMPRESSION: Left lower lobe pneumonia. Followup PA and lateral chest X-ray is recommended in 3-4 weeks following trial of antibiotic therapy to ensure resolution and exclude underlying malignancy. Electronically Signed: By: Marijo Conception, M.D.  On: 09/12/2016 10:33    EKG:   Normal sinus rhythm 88 bpm, left atrial enlargement, right bundle branch block, ST depressions laterally  IMPRESSION AND PLAN:   1. NSTEMI versus myocarditis. Troponin 46.85. Likely event happened on Wednesday or Thursday. Case discussed with Dr. Ubaldo Lawrence cardiology. Cardiac cath will be delayed until Monday. Stat echocardiogram. He will decide if heparin drip will be warranted based on the echo results. Start aspirin and metoprolol. Check a CPK. Trend troponins and monitor on telemetry. 2. Pneumonia with decreased air entry. Start DuoNeb nebulizer solution with budesonide nebulizers. + Medrol. With multiple allergies I will go with doxycycline and aztreonam for pneumonia. 3. Acute kidney injury and dehydration we'll give IV fluid hydration. 4. Essential hypertension switched Dyazide over to metoprolol 5. Hyperlipidemia unspecified on low-dose Crestor. Check a lipid profile in the a.m. 6. History of breast cancer status post radiation therapy and lumpectomy in the past.  All the records are reviewed and case discussed with ED provider. Management plans discussed with the patient, family and they are in agreement.  CODE STATUS: Full code  TOTAL TIME TAKING CARE OF THIS PATIENT: 50 minutes.    Loletha Grayer M.D on 09/12/2016 at 11:34 AM  Between 7am to 6pm - Pager - [phone]  After 6pm call admission pager [phone]  [facility]  [phone]  CC: Primary care physician; Darlene Lofts, MD

## 2016-09-12 NOTE — Patient Instructions (Signed)
We have let [facility] ER know you are coming.  Go directly to ER for likely IVF, eval and possible overnight admission for observation.

## 2016-09-12 NOTE — Progress Notes (Signed)
   Subjective:    Patient ID: Darlene Lawrence, female    DOB: February 20, 1941, 77 y.o.   MRN: [id number]  HPI    76 year old female pt with history of  CKD and  HTN presents with new onset  N/V and diarrhea x 2 days  She began with 5 day ago, strong cough, dry.  She had been very fatigued.   2 days ago began with nausea resulting in emesis. Diarrhea began ( Bm 2 times a day, softer) No blood in emesis , no blood in stool.  No abdominal pain  She has only been able to keep down a small amount of gingerale, pepsi and chicken noodle soup, ice.  No emesis in lastr 24 hours. Fever 102.8 F on 5/3 Husband felt she has been very sedated and weak and slow to answer. Tried to take tylenol, may have thrown this up.  No SOB, no wheeze.  No sick contacts.  Has not taken maxide today.   Blood pressure 100/70, pulse 93, temperature 100.1 F (37.8 C), temperature source Oral, height 5' 4.5" (1.638 m), weight 213 lb 8 oz (96.8 kg). BP Readings from Last 3 Encounters:  09/12/16 100/70  07/29/16 118/80  03/14/16 132/77    Review of Systems  Constitutional: Positive for fatigue and fever.  HENT: Negative for ear pain.   Eyes: Negative for pain.  Respiratory: Negative for shortness of breath and wheezing.   Cardiovascular: Negative for chest pain.       Objective:   Physical Exam  Constitutional: Vital signs are normal. She appears well-developed and well-nourished. She is cooperative.  Non-toxic appearance. She does not appear ill. No distress.  HENT:  Head: Normocephalic.  Right Ear: Hearing, tympanic membrane, external ear and ear canal normal. Tympanic membrane is not erythematous, not retracted and not bulging.  Left Ear: Hearing, tympanic membrane, external ear and ear canal normal. Tympanic membrane is not erythematous, not retracted and not bulging.  Nose: No mucosal edema or rhinorrhea. Right sinus exhibits no maxillary sinus tenderness and no frontal sinus tenderness. Left sinus  exhibits no maxillary sinus tenderness and no frontal sinus tenderness.  Mouth/Throat: Uvula is midline, oropharynx is clear and moist and mucous membranes are normal.  Eyes: Conjunctivae, EOM and lids are normal. Pupils are equal, round, and reactive to light. Lids are everted and swept, no foreign bodies found. Right eye exhibits no discharge. Left eye exhibits no discharge.  Neck: Trachea normal and normal range of motion. Neck supple. Carotid bruit is not present. No thyroid mass and no thyromegaly present.  Cardiovascular: Normal rate, regular rhythm, S1 normal, S2 normal, normal heart sounds, intact distal pulses and normal pulses.  Exam reveals no gallop and no friction rub.   No murmur heard. Pulmonary/Chest: Effort normal. No tachypnea. No respiratory distress. She has no decreased breath sounds. She has no wheezes. She has rhonchi in the left middle field and the left lower field. She has no rales.  Abdominal: Soft. Normal appearance and bowel sounds are normal. There is no tenderness.  Neurological: She is alert.  Skin: Skin is warm, dry and intact. No rash noted.  Psychiatric: Her speech is normal and behavior is normal. Judgment and thought content normal. Her mood appears not anxious. Cognition and memory are normal. She does not exhibit a depressed mood.          Assessment & Plan:

## 2016-09-12 NOTE — Progress Notes (Signed)
*  PRELIMINARY RESULTS* Echocardiogram 2D Echocardiogram has been performed.  Sherrie Sport 09/12/2016, 12:06 PM

## 2016-09-12 NOTE — ED Provider Notes (Signed)
[facility] Emergency Department Provider Note  ____________________________________________   First MD Initiated Contact with Patient 09/12/16 [phone]     (approximate)  I have reviewed the triage vital signs and the nursing notes.   HISTORY  Chief Complaint Emesis and Cough   HPI Darlene Lawrence is a 76 y.o. female with a history of hypertension and a right bundle-branch block was present to the emergency department today with 1 weeks with cough and fever. She was seen by her primary care doctor this morning who heard rhonchi to the left lower field and was concerned for pneumonia as well as kidney failure because of vomiting. The patient is denying any chest pain. No chest pain with deep breathing. Not reporting any shortness of breath. Patient reports a fever to 103 yesterday but no fever today. Has not taken Tylenol today. Last vomiting was last night at midnight. Has not tolerated by mouth intake since.   Past Medical History:  Diagnosis Date  . Abnormal Pap smear   . Arthritis   . breast cancer 2009   right  . DDD (degenerative disc disease), lumbar   . High cholesterol   . Hyperlipidemia   . Hypertension   . Meniscus tear    right  . Personal history of radiation therapy   . RBBB     Patient Active Problem List   Diagnosis Date Noted  . Community acquired pneumonia 09/12/2016  . Dehydration 09/12/2016  . CKD (chronic kidney disease) stage 3, GFR 30-59 ml/min 10/16/2015  . Bilateral hearing loss 10/16/2015  . Arthritis 11/10/2014  . History of breast cancer 11/10/2014  . Cystocele 10/11/2014  . Counseling regarding end of life decision making 07/04/2014  . Postmenopausal 01/13/2014  . OA (osteoarthritis) of knee 06/06/2013  . Thrombocytopenia, unspecified ([place]) 11/11/2012  . Cystocele or rectocele with uterine prolapse 06/09/2012  . Uterine prolapse 09/11/2010  . Malignant neoplasm of right female breast ([place]) 06/28/2007  .  Hyperlipidemia 06/28/2007  . Essential hypertension 06/28/2007  . BUNDLE BRANCH BLOCK, RIGHT 06/28/2007    Past Surgical History:  Procedure Laterality Date  . BREAST BIOPSY Right 08/18/2007  . BREAST LUMPECTOMY Right 2009  . BREAST SURGERY Right 2009   lumpectomy, no radiation, no chemo  . DILATION AND CURETTAGE OF UTERUS  2012   Dr. Fermin Schwab  . HERNIA REPAIR     umbilical  . JOINT REPLACEMENT Left    hip  . 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 TUBAL LIGATION      Prior to Admission medications   Medication Sig Start Date End Date Taking? Authorizing Provider  Aspirin (ASPIR-81 PO) Take 1 tablet by mouth.   Yes Historical Provider, MD  b complex vitamins tablet Take 1 tablet by mouth daily.   Yes Historical Provider, MD  Calcium Citrate-Vitamin D (CALCIUM CITRATE + PO) Take 1 tablet by mouth daily.   Yes Historical Provider, MD  Cholecalciferol (VITAMIN D3) 2000 UNITS TABS Take 1 capsule by mouth 2 (two) times daily.   Yes Historical Provider, MD  Coenzyme Q10 (COQ10) 100 MG CAPS Take 1 capsule by mouth daily.   Yes Historical Provider, MD  Misc Natural Products (GLUCOSAMINE CHOND MSM FORMULA PO) Take 1,500 mg by mouth.   Yes Historical Provider, MD  Multiple Vitamin (MULTI VITAMIN DAILY PO) Take 1 tablet by mouth.  Yes Historical Provider, MD  Omega-3 Fatty Acids (FISH OIL) 1200 MG CAPS Take 1 capsule by mouth.   Yes Historical Provider, MD  rosuvastatin (CRESTOR) 10 MG tablet Take 10 mg by mouth 2 (two) times a week. Monday, Thursday. 11/05/10  Yes Amy E Diona Browner, MD  triamterene-hydrochlorothiazide (MAXZIDE-25) 37.5-25 MG tablet TAKE 1/2 TABLET BY MOUTH EVERY DAY 12/07/15  Yes Amy E Bedsole, MD  HYDROcodone-homatropine (HYCODAN) 5-1.5 MG/5ML syrup Take 5 mLs by mouth every 8 (eight) hours as needed for cough. Patient not taking:  Reported on 09/12/2016 07/29/16   Lucille Passy, MD    Allergies Cefixime; Levaquin [levofloxacin]; and Penicillins  Family History  Problem Relation Age of Onset  . Cancer Mother     breast and colon  . Breast cancer Mother   . Hypertension Father   . Heart disease Father   . Thyroid disease Father   . Down syndrome Son   . Asthma Other     Social History Social History  Substance Use Topics  . Smoking status: Never Smoker  . Smokeless tobacco: Never Used  . Alcohol use Yes     Comment: once a month    Review of Systems  Constitutional: No fever/chills Eyes: No visual changes. ENT: No sore throat. Cardiovascular: Denies chest pain. Respiratory: cough Gastrointestinal: No abdominal pain.  No nausea, no vomiting.  No diarrhea.  No constipation. Genitourinary: Negative for dysuria. Musculoskeletal: Negative for back pain. Skin: Negative for rash. Neurological: Negative for headaches, focal weakness or numbness.   ____________________________________________   PHYSICAL EXAM:  VITAL SIGNS: ED Triage Vitals  Enc Vitals Group     BP 09/12/16 0936 93/81     Pulse Rate 09/12/16 0936 91     Resp 09/12/16 0936 20     Temp 09/12/16 0936 98 F (36.7 C)     Temp Source 09/12/16 0936 Oral     SpO2 09/12/16 0936 98 %     Weight 09/12/16 0930 213 lb (96.6 kg)     Height 09/12/16 0930 5\' 4"  (1.626 m)     Head Circumference --      Peak Flow --      Pain Score 09/12/16 0935 0     Pain Loc --      Pain Edu? --      Excl. in [place]? --     Constitutional: Alert and oriented. Well appearing and in no acute distress. Eyes: Conjunctivae are normal. PERRL. EOMI. Head: Atraumatic. Nose: No congestion/rhinnorhea. Mouth/Throat: Mucous membranes are moist.   Neck: No stridor.   Cardiovascular: Normal rate, regular rhythm. Grossly normal heart sounds.  Respiratory: Normal respiratory effort.  No retractions. Rales throughout which are worse to the left lower  field. Gastrointestinal: Soft and nontender. No distention.  Musculoskeletal: No lower extremity tenderness nor edema.  No joint effusions. Neurologic:  Normal speech and language. No gross focal neurologic deficits are appreciated. No gait instability. Skin:  Skin is warm, dry and intact. No rash noted. Psychiatric: Mood and affect are normal. Speech and behavior are normal.  ____________________________________________   LABS (all labs ordered are listed, but only abnormal results are displayed)  Labs Reviewed  COMPREHENSIVE METABOLIC PANEL - Abnormal; Notable for the following:       Result Value   Sodium 134 (*)    Chloride 96 (*)    Glucose, Bld 179 (*)    BUN 33 (*)    Creatinine, Ser 1.79 (*)    Calcium 8.6 (*)  Total Protein 8.3 (*)    AST 121 (*)    GFR calc non Af Amer 27 (*)    GFR calc Af Amer 31 (*)    All other components within normal limits  CBC WITH DIFFERENTIAL/PLATELET - Abnormal; Notable for the following:    Platelets 146 (*)    Neutro Abs 8.0 (*)    All other components within normal limits  LACTIC ACID, PLASMA - Abnormal; Notable for the following:    Lactic Acid, Venous 2.5 (*)    All other components within normal limits  TROPONIN I - Abnormal; Notable for the following:    Troponin I 46.85 (*)    All other components within normal limits  CULTURE, BLOOD (ROUTINE X 2)  CULTURE, BLOOD (ROUTINE X 2)  URINE CULTURE  LACTIC ACID, PLASMA  URINALYSIS, COMPLETE (UACMP) WITH MICROSCOPIC   ____________________________________________  EKG  ED ECG REPORT I, Doran Stabler, the attending physician, personally viewed and interpreted this ECG.   Date: 09/12/2016  EKG Time: 0944  Rate: 88  Rhythm: normal sinus rhythm  Axis: normal  Intervals:right bundle branch block  ST&T Change: ST depressions in V2 and V3 and V4. No ST elevations.  ____________________________________________  RADIOLOGY  DG Chest 2 View (Accession [id number]) (Order  [id number])  Imaging  Date: 09/12/2016 Department: [facility] EMERGENCY DEPARTMENT Released By/Authorizing: Orbie Pyo, MD (auto-released)  Exam Information   Status Exam Begun  Exam Ended   Final [99] 09/12/2016 10:19 AM 09/12/2016 10:29 AM  PACS Images   Show images for DG Chest 2 View  Addendum   ADDENDUM REPORT: 09/12/2016 11:00  ADDENDUM: Potentially this could represent a Hampton's hump sign which would suggest the possibility of pulmonary embolism. These results were called by telephone at the time of interpretation on 09/12/2016 at 11:00 am to Dr. Larae Grooms , who verbally acknowledged these results.   Electronically Signed   By: Marijo Conception, M.D.   On: 09/12/2016 11:00   Addended by Sabino Dick, MD on 09/12/2016 11:03 AM    Study Result   CLINICAL DATA:  Cough.  EXAM: CHEST  2 VIEW  COMPARISON:  Radiographs of May 31, 2014.  FINDINGS: The heart size and mediastinal contours are within normal limits. No pneumothorax or pleural effusion is noted. Right lung is clear. Interval development of left lower lobe airspace opacity is noted consistent with pneumonia. The visualized skeletal structures are unremarkable.  IMPRESSION: Left lower lobe pneumonia. Followup PA and lateral chest X-ray is recommended in 3-4 weeks following trial of antibiotic therapy to ensure resolution and exclude underlying malignancy.  Electronically Signed: By: Marijo Conception, M.D. On: 09/12/2016 10:33       ____________________________________________   PROCEDURES  Procedure(s) performed:   Procedures  Critical Care performed:  CRITICAL CARE Performed by: Doran Stabler   Total critical care time: 35 minutes  Critical care time was exclusive of separately billable procedures and treating other patients.  Critical care was necessary to treat or prevent imminent or life-threatening deterioration.  Critical care  was time spent personally by me on the following activities: development of treatment plan with patient and/or surrogate as well as nursing, discussions with consultants, evaluation of patient's response to treatment, examination of patient, obtaining history from patient or surrogate, ordering and performing treatments and interventions, ordering and review of laboratory studies, ordering and review of radiographic studies, pulse oximetry and re-evaluation of patient's condition.  ____________________________________________   INITIAL  IMPRESSION / ASSESSMENT AND PLAN / ED COURSE  Pertinent labs & imaging results that were available during my care of the patient were reviewed by me and considered in my medical decision making (see chart for details).  ----------------------------------------- 11:04 AM on 09/12/2016 -----------------------------------------  At this point, the patient has been evaluated by Dr. Lilly Cove who has reviewed the patient's EKGs. Because the patient denies any symptoms of chest pain or shortness of breath at this time Dr. Satira Mccallum says that he will not be taking her to the catheter lab emergently. He is also considering myocarditis as a possibility the patient's high troponin and will be getting an echocardiogram for further evaluation. Pulmonary embolus is also on the differential. However, the patient's GFR is slightly low to be tolerating contrast at this time. Discussed the case with Dr. Earleen Newport of the hospitalist service he says that he will hydrate the patient and defer CAT scan of the chest until her renal function may tolerate the contrast. Patient without tachycardia at this time. Blood pressure persistently in the 90s. Was borderline hypoxic in the low 90s on room air was placed on nasal cannula oxygen and is now in the high 90s. Also discussed heparin with Dr. Ubaldo Glassing who does not want [place] to give heparin at this time until the echo is obtained. I updated the family as well as  the patient about the suspected diagnoses and the need for permission to the hospital. They're understanding and willing to comply.      ____________________________________________   FINAL CLINICAL IMPRESSION(S) / ED DIAGNOSES  Community acquired pneumonia. Elevated troponin.    NEW MEDICATIONS STARTED DURING THIS VISIT:  New Prescriptions   No medications on file     Note:  This document was prepared using Dragon voice recognition software and may include unintentional dictation errors.     Orbie Pyo, MD 09/12/16 [phone]

## 2016-09-12 NOTE — Assessment & Plan Note (Signed)
Concern for ARF in setting of dehydration. Need eval or Cr and GFR.

## 2016-09-13 ENCOUNTER — Inpatient Hospital Stay: Payer: Medicare Other

## 2016-09-13 ENCOUNTER — Encounter: Payer: Self-pay | Admitting: Radiology

## 2016-09-13 LAB — BLOOD GAS, ARTERIAL
ACID-BASE EXCESS: 3.8 mmol/L — AB (ref 0.0–2.0)
BICARBONATE: 25.9 mmol/L (ref 20.0–28.0)
Delivery systems: POSITIVE
Expiratory PAP: 5
FIO2: 0.6
Inspiratory PAP: 10
O2 SAT: 98.2 %
PO2 ART: 95 mmHg (ref 83.0–108.0)
Patient temperature: 37
pCO2 arterial: 31 mmHg — ABNORMAL LOW (ref 32.0–48.0)
pH, Arterial: 7.53 — ABNORMAL HIGH (ref 7.350–7.450)

## 2016-09-13 LAB — CBC
HEMATOCRIT: 38 % (ref 35.0–47.0)
HEMOGLOBIN: 12.7 g/dL (ref 12.0–16.0)
MCH: 32.9 pg (ref 26.0–34.0)
MCHC: 33.3 g/dL (ref 32.0–36.0)
MCV: 98.8 fL (ref 80.0–100.0)
Platelets: 110 10*3/uL — ABNORMAL LOW (ref 150–440)
RBC: 3.85 MIL/uL (ref 3.80–5.20)
RDW: 14.1 % (ref 11.5–14.5)
WBC: 7.1 10*3/uL (ref 3.6–11.0)

## 2016-09-13 LAB — BASIC METABOLIC PANEL
ANION GAP: 9 (ref 5–15)
BUN: 40 mg/dL — ABNORMAL HIGH (ref 6–20)
CO2: 24 mmol/L (ref 22–32)
Calcium: 7.6 mg/dL — ABNORMAL LOW (ref 8.9–10.3)
Chloride: 97 mmol/L — ABNORMAL LOW (ref 101–111)
Creatinine, Ser: 1.22 mg/dL — ABNORMAL HIGH (ref 0.44–1.00)
GFR calc Af Amer: 50 mL/min — ABNORMAL LOW (ref >60)
GFR calc non Af Amer: 43 mL/min — ABNORMAL LOW (ref >60)
GLUCOSE: 127 mg/dL — AB (ref 65–99)
POTASSIUM: 3.2 mmol/L — AB (ref 3.5–5.1)
Sodium: 130 mmol/L — ABNORMAL LOW (ref 135–145)

## 2016-09-13 LAB — LIPID PANEL
Cholesterol: 114 mg/dL (ref 0–200)
HDL: 26 mg/dL — ABNORMAL LOW (ref >40)
LDL CALC: 62 mg/dL (ref 0–99)
Total CHOL/HDL Ratio: 4.4 RATIO
Triglycerides: 129 mg/dL (ref <150)
VLDL: 26 mg/dL (ref 0–40)

## 2016-09-13 LAB — HEPARIN LEVEL (UNFRACTIONATED)
HEPARIN UNFRACTIONATED: 0.26 [IU]/mL — AB (ref 0.30–0.70)
HEPARIN UNFRACTIONATED: 0.3 [IU]/mL (ref 0.30–0.70)
Heparin Unfractionated: 0.44 IU/mL (ref 0.30–0.70)

## 2016-09-13 LAB — MRSA PCR SCREENING: MRSA BY PCR: NEGATIVE

## 2016-09-13 LAB — BRAIN NATRIURETIC PEPTIDE: B NATRIURETIC PEPTIDE 5: 981 pg/mL — AB (ref 0.0–100.0)

## 2016-09-13 MED ORDER — FUROSEMIDE 10 MG/ML IJ SOLN
40.0000 mg | Freq: Once | INTRAMUSCULAR | Status: AC
  Administered 2016-09-13: 40 mg via INTRAVENOUS
  Filled 2016-09-13: qty 4

## 2016-09-13 MED ORDER — ONDANSETRON HCL 4 MG/2ML IJ SOLN
4.0000 mg | Freq: Four times a day (QID) | INTRAMUSCULAR | Status: DC | PRN
  Administered 2016-09-13 – 2016-09-21 (×2): 4 mg via INTRAVENOUS
  Filled 2016-09-13 (×2): qty 2

## 2016-09-13 MED ORDER — POTASSIUM CHLORIDE CRYS ER 20 MEQ PO TBCR
20.0000 meq | EXTENDED_RELEASE_TABLET | Freq: Once | ORAL | Status: AC
  Administered 2016-09-13: 20 meq via ORAL
  Filled 2016-09-13: qty 1

## 2016-09-13 MED ORDER — IOPAMIDOL (ISOVUE-370) INJECTION 76%
60.0000 mL | Freq: Once | INTRAVENOUS | Status: AC | PRN
  Administered 2016-09-13: 60 mL via INTRAVENOUS

## 2016-09-13 MED ORDER — PROMETHAZINE HCL 25 MG/ML IJ SOLN
12.5000 mg | Freq: Three times a day (TID) | INTRAMUSCULAR | Status: DC | PRN
Start: 2016-09-13 — End: 2016-09-22
  Administered 2016-09-13 (×2): 12.5 mg via INTRAVENOUS
  Filled 2016-09-13 (×2): qty 1

## 2016-09-13 MED ORDER — ALPRAZOLAM 0.25 MG PO TABS
0.2500 mg | ORAL_TABLET | Freq: Once | ORAL | Status: AC
  Administered 2016-09-13: 0.25 mg via ORAL
  Filled 2016-09-13: qty 1

## 2016-09-13 MED ORDER — ONDANSETRON HCL 4 MG/2ML IJ SOLN: 4.0000 mg | Freq: Four times a day (QID) | INTRAMUSCULAR | Status: DC | PRN

## 2016-09-13 MED ORDER — HEPARIN BOLUS VIA INFUSION
1200.0000 [IU] | Freq: Once | INTRAVENOUS | Status: AC
Start: 2016-09-13 — End: 2016-09-13
  Administered 2016-09-13: 1200 [IU] via INTRAVENOUS
  Filled 2016-09-13: qty 1200

## 2016-09-13 MED ORDER — NITROGLYCERIN IN D5W 200-5 MCG/ML-% IV SOLN
0.0000 ug/min | INTRAVENOUS | Status: DC
  Administered 2016-09-13: 5 ug/min via INTRAVENOUS
  Filled 2016-09-13: qty 250

## 2016-09-13 MED ORDER — DEXTROSE 5 % IV SOLN
2.0000 g | Freq: Three times a day (TID) | INTRAVENOUS | Status: DC
  Administered 2016-09-13 – 2016-09-14 (×4): 2 g via INTRAVENOUS
  Filled 2016-09-13 (×6): qty 2

## 2016-09-13 NOTE — Progress Notes (Signed)
Pharmacy Antibiotic Note  Darlene Lawrence is a 76 y.o. female admitted on 09/12/2016 with  pneumonia.  Pharmacy has been consulted for Aztreonam dosing. Patient is also receiving doxycycline.   Plan: Will change to Aztreonam 2g Iv every 8 hours.   Height: 5\' 4"  (162.6 cm) Weight: 213 lb (96.6 kg) IBW/kg (Calculated) : 54.7  Temp (24hrs), Avg:99.7 F (37.6 C), Min:98.7 F (37.1 C), Max:102.1 F (38.9 C)   Recent Labs Lab 09/12/16 0951 09/12/16 1353 09/13/16 0412  WBC 9.5  --  7.1  CREATININE 1.79*  --  1.22*  LATICACIDVEN 2.5* 1.7  --     Estimated Creatinine Clearance: 46.4 mL/min (A) (by C-G formula based on SCr of 1.22 mg/dL (H)).    Allergies  Allergen Reactions  . Cefixime     REACTION: Rash  . Levaquin [Levofloxacin] Other (See Comments)    Shoulder Bursitis  . Penicillins     REACTION: Rash    Antimicrobials this admission: 5/4 aztreonam >>  5/4 doxycycline >>   Dose adjustments this admission:  Microbiology results:  Thank you for allowing pharmacy to be a part of this patient's care.  Larene Beach, PharmD, BCPS Clinical Pharmacist 09/13/2016 9:38 AM

## 2016-09-13 NOTE — Progress Notes (Signed)
Called [facility] regarding patient elevated blood pressure after rapid response transfer from the telemetry unit at change of shift. Dr. Oletta Darter ordered nitro gtt and Lasix 40 IV push x one. Also advised to contact cardiology. Dr. Ubaldo Glassing responded to page and informed him about the transfer to ICU, and the order received from [facility]. Dr. Ubaldo Glassing concur with Dr. Levada Schilling plan of treatment. Pt also received phenergan for nausea, IVF rate changed to 10 ml/hr and continues on heparin gtt. Pt visible less anxious, and less work of breathing noted at this time. Family at bedside and discussed treatment plan and updated condition. Continue to monitor.

## 2016-09-13 NOTE — Progress Notes (Addendum)
[place] [facility] [place] NAME: Darlene Lawrence    MR#:  [id number]  DATE OF BIRTH:  09/08/1940  SUBJECTIVE:  Came inw with sob, nausea and weakness  REVIEW OF SYSTEMS:   Review of Systems  Constitutional: Negative for chills, fever and weight loss.  HENT: Negative for ear discharge, ear pain and nosebleeds.   Eyes: Negative for blurred vision, pain and discharge.  Respiratory: Positive for cough and shortness of breath. Negative for sputum production, wheezing and stridor.   Cardiovascular: Negative for chest pain, palpitations, orthopnea and PND.  Gastrointestinal: Negative for abdominal pain, diarrhea, nausea and vomiting.  Genitourinary: Negative for frequency and urgency.  Musculoskeletal: Negative for back pain and joint pain.  Neurological: Positive for weakness. Negative for sensory change, speech change and focal weakness.  Psychiatric/Behavioral: Negative for depression and hallucinations. The patient is not nervous/anxious.    Tolerating Diet:some Tolerating PT: pending  DRUG ALLERGIES:   Allergies  Allergen Reactions  . Cefixime     REACTION: Rash  . Levaquin [Levofloxacin] Other (See Comments)    Shoulder Bursitis  . Penicillins     REACTION: Rash    VITALS:  Blood pressure 118/72, pulse 76, temperature 100 F (37.8 C), temperature source Oral, resp. rate 16, height 5\' 4"  (1.626 m), weight 96.6 kg (213 lb), SpO2 96 %.  PHYSICAL EXAMINATION:   Physical Exam  GENERAL:  76 y.o.-year-old patient lying in the bed with no acute distress.  EYES: Pupils equal, round, reactive to light and accommodation. No scleral icterus. Extraocular muscles intact.  HEENT: Head atraumatic, normocephalic. Oropharynx and nasopharynx clear.  NECK:  Supple, no jugular venous distention. No thyroid enlargement, no tenderness.  LUNGS: Normal breath sounds bilaterally, no wheezing, rales, rhonchi. No use of accessory muscles of respiration.   CARDIOVASCULAR: S1, S2 normal. No murmurs, rubs, or gallops.  ABDOMEN: Soft, nontender, nondistended. Bowel sounds present. No organomegaly or mass.  EXTREMITIES: No cyanosis, clubbing or edema b/l.    NEUROLOGIC: Cranial nerves II through XII are intact. No focal Motor or sensory deficits b/l.   PSYCHIATRIC:  patient is alert and oriented x 3.  SKIN: No obvious rash, lesion, or ulcer.   LABORATORY PANEL:  CBC  Recent Labs Lab 09/13/16 0412  WBC 7.1  HGB 12.7  HCT 38.0  PLT 110*    Chemistries   Recent Labs Lab 09/12/16 0951 09/13/16 0412  NA 134* 130*  K 3.6 3.2*  CL 96* 97*  CO2 24 24  GLUCOSE 179* 127*  BUN 33* 40*  CREATININE 1.79* 1.22*  CALCIUM 8.6* 7.6*  AST 121*  --   ALT 41  --   ALKPHOS 53  --   BILITOT 1.0  --    Cardiac Enzymes  Recent Labs Lab 09/12/16 1828  TROPONINI 31.61*   RADIOLOGY:  Dg Chest 2 View  Addendum Date: 09/12/2016   ADDENDUM REPORT: 09/12/2016 11:00 ADDENDUM: Potentially this could represent a Hampton's hump sign which would suggest the possibility of pulmonary embolism. These results were called by telephone at the time of interpretation on 09/12/2016 at 11:00 am to Dr. Larae Grooms , who verbally acknowledged these results. Electronically Signed   By: Marijo Conception, M.D.   On: 09/12/2016 11:00   Result Date: 09/12/2016 CLINICAL DATA:  Cough. EXAM: CHEST  2 VIEW COMPARISON:  Radiographs of May 31, 2014. FINDINGS: The heart size and mediastinal contours are within normal limits. No pneumothorax or pleural effusion is noted.  Right lung is clear. Interval development of left lower lobe airspace opacity is noted consistent with pneumonia. The visualized skeletal structures are unremarkable. IMPRESSION: Left lower lobe pneumonia. Followup PA and lateral chest X-ray is recommended in 3-4 weeks following trial of antibiotic therapy to ensure resolution and exclude underlying malignancy. Electronically Signed: By: Marijo Conception, M.D.  On: 09/12/2016 10:33   Ct Angio Chest Pe W Or Wo Contrast  Result Date: 09/13/2016 CLINICAL DATA:  76 year old female with NSTEMI. Abnormal left lung on chest x-ray yesterday. EXAM: CT ANGIOGRAPHY CHEST WITH CONTRAST TECHNIQUE: Multidetector CT imaging of the chest was performed using the standard protocol during bolus administration of intravenous contrast. Multiplanar CT image reconstructions and MIPs were obtained to evaluate the vascular anatomy. CONTRAST:  60 mL Isovue 370 COMPARISON:  Chest radiographs 09/12/2016 and earlier. FINDINGS: Cardiovascular: Good contrast bolus timing in the pulmonary arterial tree. No focal filling defect identified in the pulmonary arteries to suggest acute pulmonary embolism. No pericardial effusion.  Mild Calcified aortic atherosclerosis. Mediastinum/Nodes: No mediastinal or hilar lymphadenopathy. Lungs/Pleura: Subtotal consolidation of the left lower lobe. Small superimposed left pleural effusion, partially tracking in the left major fissure. Multifocal peripheral ground-glass and confluent opacity in the left upper lobe. Mild peribronchial opacity in the lingula. Right lung mostly peribronchial multifocal opacity, but there is some peripheral opacity in the right upper lobe and middle lobe. Superimposed small right pleural effusion, mostly within the right major fissure. There is mild paraseptal emphysema in the right costophrenic angle (series 6, image 65). Upper Abdomen: Large simple fluid density exophytic left renal cyst is partially visible. Hepatic steatosis. Cholelithiasis. Negative visualized spleen, pancreas, adrenal glands, and bowel in the upper abdomen. Musculoskeletal: Mild mid thoracic vertebral body wedging appears to be chronic. Degenerative thoracic endplate spurring. Osteopenia. No acute osseous abnormality identified. Review of the MIP images confirms the above findings. IMPRESSION: 1.  No evidence of acute pulmonary embolus. 2. Bilateral multilobar  pneumonia with severe left lower lobe consolidation. Small bilateral pleural effusions. 3. Fatty liver disease.  Cholelithiasis. 4. Mild Calcified aortic atherosclerosis. Electronically Signed   By: Genevie Ann M.D.   On: 09/13/2016 11:03   ASSESSMENT AND PLAN:   Darlene Lawrence  is a 76 y.o. female presents with cough starting on Monday. She has been more lethargic and she slept all day on Wednesday and Thursday. She had some nausea vomiting and diarrhea that occurred on Wednesday and Thursday. She's had some disorientation. She's not eating or drinking. No complaints of chest pain or shortness of breath. Her cough is nonproductive  1. Acute NSTEMI  - Troponin 46.85---47.03--31.61 -Likely event happened on Wednesday or Thursday. Case discussed with Dr. Ubaldo Glassing cardiology. - Cardiac cath will be delayed until Monday.  -IV heparin gtt, aspirin and metoprolol. C  2. LL Pneumonia with decreased air entry.  -Start DuoNeb nebulizer solution with budesonide nebulizers.  -With multiple allergies I will go with doxycycline and aztreonam for pneumonia.  3. Acute kidney injury and dehydration we'll give IV fluid hydration.  4. Essential hypertension switched Dyazide over to metoprolol  5. Hyperlipidemia unspecified on low-dose Crestor.   6. History of breast cancer status post radiation therapy and lumpectomy in the past.   Case discussed with Care Management/Social Worker. Management plans discussed with the patient, family and they are in agreement.  CODE STATUS: Full  DVT Prophylaxis: on heparin gtt  TOTAL TIME TAKING CARE OF THIS PATIENT: *40* minutes.  >50% time spent on counselling and coordination of care  POSSIBLE D/C IN 1-2 DAYS, DEPENDING ON CLINICAL CONDITION.  Note: This dictation was prepared with Dragon dictation along with smaller phrase technology. Any transcriptional errors that result from this process are unintentional.  Sondra Blixt M.D on 09/13/2016 at 12:34 PM  Between 7am  to 6pm - Pager - [phone]  After 6pm go to [email or web address] - password [place] [place] [facility]  Office  [phone]  CC: Primary care physician; Jinny Sanders, MD

## 2016-09-13 NOTE — Progress Notes (Signed)
Called by nursing staff that pt's O2 requirements are going up.  Pt. Was on 2 L [place] earlier and now O2 sats 88 % on 6L.   Pt. Is restless and anxious and hypoxic.  Advised Nurse to get ABG, CXR Stat and place pt. On HiFlo [place].   Pt. Is being treated for NSTEMi with planned cath for Monday and also multi-focal pneumonia with IV abx. Due to High O2 requirements and pt. Requiring Bipap pt. Was transferred to ICU.

## 2016-09-13 NOTE — Progress Notes (Signed)
Called [facility] to request something for anxiety, has been restless since phenergan for off, and has been incontinent of urine since receiving Lasix.

## 2016-09-13 NOTE — Progress Notes (Signed)
[facility] Physician-Brief Progress Note Patient Name: Darlene Lawrence DOB: Nov 03, 1940 MRN: [id number]   Date of Service  09/13/2016  HPI/Events of Note  76 yo female admitted with NSTEMI,  LL Pneumonia and AKI. Cardiac cath planned for Monday. Developed acute respiratory distress on medical floor and moved to ICU. Now on BiPAP. Sat = 96%.  CXR looks like CHF to my read. BP = 157/140 with HR = 100. NSR. Troponin already being cycled. Patient is already on a Heparin IV infusion, B-Blocker and ASA.   eICU Interventions  Will order: 1. Lasix 40 mg IV now. 2. Nitroglycerin IV infusion.  3. Asked nurse to notify Cardiology of events in the event that they want to consider cardiac cath before Monday.      Intervention Category Evaluation Type: New Patient Evaluation  Lysle Dingwall 09/13/2016, 7:43 PM

## 2016-09-13 NOTE — Progress Notes (Signed)
[facility] Physician-Brief Progress Note Patient Name: Darlene Lawrence DOB: 06-Dec-1940 MRN: [id number]   Date of Service  09/13/2016  HPI/Events of Note  Nurse request medication for anxiety.   eICU Interventions  Will order: 1. Xanax 0.25 mg PO X 1. 2. ABG now.      Intervention Category Minor Interventions: Agitation / anxiety - evaluation and management  Sommer,Steven Eugene 09/13/2016, 10:30 PM

## 2016-09-13 NOTE — Progress Notes (Signed)
       [facility]  SUBJECTIVE: Still with nausea and elevated troponin.   Vitals:   09/13/16 0211 09/13/16 0424 09/13/16 0743 09/13/16 0744  BP:  103/63  118/72  Pulse:  69  76  Resp:  16  16  Temp:  98.9 F (37.2 C)  99.1 F (37.3 C)  TempSrc:  Oral  Oral  SpO2: 91% 92% 96% 96%  Weight:      Height:        Intake/Output Summary (Last 24 hours) at 09/13/16 1010 Last data filed at 09/13/16 0700  Gross per 24 hour  Intake          2306.75 ml  Output              300 ml  Net          2006.75 ml    LABS: Basic Metabolic Panel:  Recent Labs  09/12/16 0951 09/13/16 0412  NA 134* 130*  K 3.6 3.2*  CL 96* 97*  CO2 24 24  GLUCOSE 179* 127*  BUN 33* 40*  CREATININE 1.79* 1.22*  CALCIUM 8.6* 7.6*   Liver Function Tests:  Recent Labs  09/12/16 0951  AST 121*  ALT 41  ALKPHOS 53  BILITOT 1.0  PROT 8.3*  ALBUMIN 3.5   No results for input(s): LIPASE, AMYLASE in the last 72 hours. CBC:  Recent Labs  09/12/16 0951 09/13/16 0412  WBC 9.5 7.1  NEUTROABS 8.0*  --   HGB 14.9 12.7  HCT 43.0 38.0  MCV 98.1 98.8  PLT 146* 110*   Cardiac Enzymes:  Recent Labs  09/12/16 0951 09/12/16 1353 09/12/16 1828  CKTOTAL  --  995*  --   TROPONINI 46.85* 47.03* 31.61*   BNP: Invalid input(s): POCBNP D-Dimer: No results for input(s): DDIMER in the last 72 hours. Hemoglobin A1C: No results for input(s): HGBA1C in the last 72 hours. Fasting Lipid Panel:  Recent Labs  09/13/16 0412  CHOL 114  HDL 26*  LDLCALC 62  TRIG 129  CHOLHDL 4.4   Thyroid Function Tests: No results for input(s): TSH, T4TOTAL, T3FREE, THYROIDAB in the last 72 hours.  Invalid input(s): FREET3 Anemia Panel: No results for input(s): VITAMINB12, FOLATE, FERRITIN, TIBC, IRON, RETICCTPCT in the last 72 hours.   Physical Exam: Blood pressure 118/72, pulse 76, temperature 99.1 F (37.3 C), temperature source Oral, resp. rate 16, height 5\' 4"   (1.626 m), weight 96.6 kg (213 lb), SpO2 96 %.   Wt Readings from Last 1 Encounters:  09/12/16 96.6 kg (213 lb)     General appearance: alert and cooperative Resp: clear to auscultation bilaterally Cardio: regular rate and rhythm GI: abnormal findings:  Nausea Extremities: extremities normal, atraumatic, no cyanosis or edema Neurologic: Grossly normal  TELEMETRY: Reviewed telemetry pt in normal sinus rhythm:  ASSESSMENT AND PLAN:  Active Problems:   NSTEMI (non-ST elevated myocardial infarction) (HCC)-patient with elevated cardiac markers consistent with possible non-ST elevation myocardial infarction. Echo showed basilar hypokinesis with moderate dilated right ventricle. Patient has a Hampton's hump on chest x-ray versus pneumonia. Consider chest CT to evaluate for possible pulmonary embolus. Patient continues to have nausea and anorexia. May be secondary to her airspace disease versus ischemia. We'll continue with empiric antibiotics. We'll give her Zofran for symptom relief. Serum potassium 3.2. We'll need to replete.    Teodoro Spray, MD, [facility] 09/13/2016 10:10 AM

## 2016-09-13 NOTE — Progress Notes (Signed)
MD wants a repeat ABG, called RT to perform before giving medication for anxiety.

## 2016-09-13 NOTE — Progress Notes (Addendum)
Darlene Lawrence has suffered from severe nausea since mid morning.  Zofran 4 mg. IV did not improve it.  Promethazine 12.5 mg IV made a huge improvement.

## 2016-09-13 NOTE — Progress Notes (Signed)
Patient has been moving around in the bed every few minutes.  She pulled out her left antecubital IV.  O2 sats on 2L [place] was 84%  on 4L [place] 85%. On 6L[place] 92%.  Respiratory called to give a breathing treatment and assist in the evaluation. Continued to show symptoms of hypoxia. Rapid response called, and she was transferred to the ICU to start BiPAP.

## 2016-09-13 NOTE — Progress Notes (Signed)
ANTICOAGULATION CONSULT NOTE - Initial Consult  Pharmacy Consult for heparin bolus and drip Indication: chest pain/ACS  Allergies  Allergen Reactions  . Cefixime     REACTION: Rash  . Levaquin [Levofloxacin] Other (See Comments)    Shoulder Bursitis  . Penicillins     REACTION: Rash    Patient Measurements: Height: 5\' 4"  (162.6 cm) Weight: 213 lb (96.6 kg) IBW/kg (Calculated) : 54.7 Heparin Dosing Weight: 76.8 kg  Vital Signs: Temp: 98.3 F (36.8 C) (05/05 1242) Temp Source: Oral (05/05 1107) BP: 104/60 (05/05 1242) Pulse Rate: 70 (05/05 1242)  Labs:  Recent Labs  09/12/16 0951 09/12/16 1353 09/12/16 1828 09/12/16 2223 09/13/16 0412 09/13/16 1319  HGB 14.9  --   --   --  12.7  --   HCT 43.0  --   --   --  38.0  --   PLT 146*  --   --   --  110*  --   APTT  --  33  --   --   --   --   LABPROT  --  14.0  --   --   --   --   INR  --  1.08  --   --   --   --   HEPARINUNFRC  --   --   --  0.43 0.26* 0.30  CREATININE 1.79*  --   --   --  1.22*  --   CKTOTAL  --  995*  --   --   --   --   TROPONINI 46.85* 47.03* 31.61*  --   --   --     Estimated Creatinine Clearance: 46.4 mL/min (A) (by C-G formula based on SCr of 1.22 mg/dL (H)).   Medical History: Past Medical History:  Diagnosis Date  . Abnormal Pap smear   . Arthritis   . breast cancer 2009   right  . Cancer ([place])   . DDD (degenerative disc disease), lumbar   . High cholesterol   . Hyperlipidemia   . Hypertension   . Meniscus tear    right  . Personal history of radiation therapy   . RBBB     Medications:  Scheduled:  . aspirin  325 mg Oral Daily  . budesonide (PULMICORT) nebulizer solution  0.5 mg Nebulization BID  . cholecalciferol  2,000 Units Oral BID  . ipratropium-albuterol  3 mL Nebulization Q6H  . metoprolol tartrate  25 mg Oral BID  . multivitamin with minerals  1 tablet Oral q morning - 10a  . omega-3 acid ethyl esters  1 g Oral BID  . [START ON 09/15/2016] rosuvastatin  10 mg Oral  Once per day on Mon Thu   Infusions:  . sodium chloride 75 mL/hr at 09/13/16 0650  . aztreonam Stopped (09/13/16 1135)  . doxycycline (VIBRAMYCIN) IV Stopped (09/13/16 1358)  . heparin 1,100 Units/hr (09/13/16 0959)    Assessment: Pharmacy consulted to dose heparin bolus and drip in this 27 yoF with ACS. Per med rec, no PTA anticoag, baseline labs ordered. Troponin = 47 and 31.6  Goal of Therapy:  Heparin level 0.3-0.7 units/ml Monitor platelets by anticoagulation protocol: Yes   Plan:  Give 4000 units bolus x 1 Start heparin infusion at 950 units/hr Check anti-Xa level in 8 hours and daily while on heparin Continue to monitor H&H and platelets   5/4 22:30 heparin level 0.43. Continue current regimen, Recheck with AM labs to confirm.  5/5 AM heparin level  0.26. 1200 unit bolus and increase rate to 1100 units/hr. Recheck in 8 hours.  5/5: Heparin level resulted @ 0.30 @ 13:19. Will recheck Heparin level @ 21:30.   Larene Beach, PharmD  09/13/16 2:50 PM

## 2016-09-13 NOTE — Progress Notes (Signed)
[place] responded to a RR page for a Pt in [place]. Nurse stated that Pt's oxygen level was low. Pt was having problem breathing. Pt transferred to [place] for critical care. [place] escorted Pt's husband to ICU waiting Rm. [place] provided support and presence.   09/13/16 2000  Clinical Encounter Type  Visited With Patient;Patient and family together  Visit Type Initial;Follow-up;Code;Trauma;Other (Comment)  Referral From Nurse  Consult/Referral To Chaplain  Spiritual Encounters  Spiritual Needs Prayer;Emotional

## 2016-09-14 ENCOUNTER — Inpatient Hospital Stay: Payer: Medicare Other

## 2016-09-14 DIAGNOSIS — I214 Non-ST elevation (NSTEMI) myocardial infarction: Principal | ICD-10-CM

## 2016-09-14 DIAGNOSIS — J9601 Acute respiratory failure with hypoxia: Secondary | ICD-10-CM

## 2016-09-14 DIAGNOSIS — J181 Lobar pneumonia, unspecified organism: Secondary | ICD-10-CM

## 2016-09-14 LAB — URINE CULTURE: Culture: <10000 — AB

## 2016-09-14 LAB — CBC
HEMATOCRIT: 37 % (ref 35.0–47.0)
Hemoglobin: 12.9 g/dL (ref 12.0–16.0)
MCH: 33.7 pg (ref 26.0–34.0)
MCHC: 34.8 g/dL (ref 32.0–36.0)
MCV: 97 fL (ref 80.0–100.0)
Platelets: 105 10*3/uL — ABNORMAL LOW (ref 150–440)
RBC: 3.81 MIL/uL (ref 3.80–5.20)
RDW: 14.2 % (ref 11.5–14.5)
WBC: 5.8 10*3/uL (ref 3.6–11.0)

## 2016-09-14 LAB — COMPREHENSIVE METABOLIC PANEL
ALBUMIN: 2.5 g/dL — AB (ref 3.5–5.0)
ALT: 60 U/L — ABNORMAL HIGH (ref 14–54)
ANION GAP: 11 (ref 5–15)
AST: 94 U/L — ABNORMAL HIGH (ref 15–41)
Alkaline Phosphatase: 42 U/L (ref 38–126)
BILIRUBIN TOTAL: 0.8 mg/dL (ref 0.3–1.2)
BUN: 35 mg/dL — ABNORMAL HIGH (ref 6–20)
CO2: 28 mmol/L (ref 22–32)
Calcium: 7.9 mg/dL — ABNORMAL LOW (ref 8.9–10.3)
Chloride: 95 mmol/L — ABNORMAL LOW (ref 101–111)
Creatinine, Ser: 1.6 mg/dL — ABNORMAL HIGH (ref 0.44–1.00)
GFR calc non Af Amer: 31 mL/min — ABNORMAL LOW (ref >60)
GFR, EST AFRICAN AMERICAN: 36 mL/min — AB (ref >60)
GLUCOSE: 120 mg/dL — AB (ref 65–99)
POTASSIUM: 3.4 mmol/L — AB (ref 3.5–5.1)
Sodium: 134 mmol/L — ABNORMAL LOW (ref 135–145)
TOTAL PROTEIN: 6.7 g/dL (ref 6.5–8.1)

## 2016-09-14 LAB — BLOOD GAS, ARTERIAL
Acid-Base Excess: 2.1 mmol/L — ABNORMAL HIGH (ref 0.0–2.0)
BICARBONATE: 25.3 mmol/L (ref 20.0–28.0)
FIO2: 0.55
O2 Saturation: 87.9 %
PCO2 ART: 34 mmHg (ref 32.0–48.0)
PH ART: 7.48 — AB (ref 7.350–7.450)
Patient temperature: 37
pO2, Arterial: 50 mmHg — ABNORMAL LOW (ref 83.0–108.0)

## 2016-09-14 LAB — PROCALCITONIN: PROCALCITONIN: 14.92 ng/mL

## 2016-09-14 LAB — TROPONIN I: TROPONIN I: 12.13 ng/mL — AB (ref <0.03)

## 2016-09-14 LAB — HEPARIN LEVEL (UNFRACTIONATED): HEPARIN UNFRACTIONATED: 0.46 [IU]/mL (ref 0.30–0.70)

## 2016-09-14 MED ORDER — CHLORHEXIDINE GLUCONATE 0.12 % MT SOLN
15.0000 mL | Freq: Two times a day (BID) | OROMUCOSAL | Status: DC
  Administered 2016-09-14 – 2016-09-22 (×12): 15 mL via OROMUCOSAL
  Filled 2016-09-14 (×11): qty 15

## 2016-09-14 MED ORDER — LORAZEPAM 2 MG/ML IJ SOLN
0.5000 mg | INTRAMUSCULAR | Status: DC | PRN
  Administered 2016-09-14 – 2016-09-16 (×4): 0.5 mg via INTRAVENOUS
  Administered 2016-09-19: 1 mg via INTRAVENOUS
  Filled 2016-09-14 (×5): qty 1

## 2016-09-14 MED ORDER — SODIUM CHLORIDE 0.9 % IV BOLUS (SEPSIS)
500.0000 mL | Freq: Once | INTRAVENOUS | Status: AC
  Administered 2016-09-14: 500 mL via INTRAVENOUS

## 2016-09-14 MED ORDER — DEXMEDETOMIDINE HCL IN NACL 400 MCG/100ML IV SOLN
0.2000 ug/kg/h | INTRAVENOUS | Status: DC
  Administered 2016-09-14: 0.2 ug/kg/h via INTRAVENOUS
  Filled 2016-09-14: qty 100

## 2016-09-14 MED ORDER — AZTREONAM 1 G IJ SOLR
1.0000 g | Freq: Three times a day (TID) | INTRAMUSCULAR | Status: AC
Start: 2016-09-14 — End: 2016-09-19
  Administered 2016-09-14 – 2016-09-19 (×16): 1 g via INTRAVENOUS
  Filled 2016-09-14 (×18): qty 1

## 2016-09-14 MED ORDER — ORAL CARE MOUTH RINSE
15.0000 mL | Freq: Two times a day (BID) | OROMUCOSAL | Status: DC
  Administered 2016-09-14 – 2016-09-22 (×9): 15 mL via OROMUCOSAL

## 2016-09-14 NOTE — Progress Notes (Signed)
eLink Physician-Brief Progress Note Patient Name: Darlene Lawrence DOB: [birth year] MRN: [id number]   Date of Service  09/14/2016  HPI/Events of Note  Call from bedside nurse reporting that patient is a daily ETOH user and is concerned about withdrawal.  Requesting CIWA protocol.  Patient is HD stable but tachy with BP of 123/75 currently on NTG gtt.  On BiPAP  eICU Interventions  Due to concern over patient's resp status reluctant to give benzos due to risk of resp depression.  Will start precedex.  Will need to monitor for hypotension/bradycardia.  May need to stop NTG gtt.  Continue to monitor     Intervention Category Major Interventions: Other:  [name] 09/14/2016, 2:14 AM

## 2016-09-14 NOTE — Progress Notes (Signed)
Nitro gtt stopped at this time.

## 2016-09-14 NOTE — Consult Note (Signed)
PULMONARY / CRITICAL CARE MEDICINE   Name: Darlene Lawrence MRN: [id number] DOB: 1940/12/25    ADMISSION DATE:  09/12/2016 CONSULTATION DATE:  09/14/16  REFERRING MD:  Fritzi Mandes  PT PROFILE:  76 y.o. F never smoker adm 05/04 with cough, dyspnea, fever, anorexia, pulmonary infiltrate on CXR and elevated trop I  HISTORY OF PRESENT ILLNESS:   As above. Admitted by Hospitalist service with diagnoses of NSTEMI, PNA and AKI. She was transferred to the ICU/SDU with worsening respiratory failure and hypoxemia and was started on BiPAP. At the time of my evaluation today, she is comfortable on high flow nasal cannula oxygen and denies chest pain. She has cough which is largely nonproductive.Denies hemoptysis, LE edema and calf tenderness. She does report increasing fatigue and decreasing exercise tolerance over the last weeks to months.    PAST MEDICAL HISTORY :  She  has a past medical history of Abnormal Pap smear; Arthritis; breast cancer (2009); Cancer ([place]); DDD (degenerative disc disease), lumbar; High cholesterol; Hyperlipidemia; Hypertension; Meniscus tear; Personal history of radiation therapy; and RBBB.  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  Allergies  Allergen Reactions  . Cefixime     REACTION: Rash  . Levaquin [Levofloxacin] Other (See Comments)    Shoulder Bursitis  . Penicillins     REACTION: Rash    No current facility-administered medications on file prior to encounter.    Current Outpatient Prescriptions on File Prior to Encounter  Medication Sig  . Aspirin (ASPIR-81 PO) Take 1 tablet by mouth.  Marland Kitchen b complex vitamins tablet Take 1 tablet by mouth daily.  . Calcium  Citrate-Vitamin D (CALCIUM CITRATE + PO) Take 1 tablet by mouth daily.  . Cholecalciferol (VITAMIN D3) 2000 UNITS TABS Take 1 capsule by mouth 2 (two) times daily.  . Coenzyme Q10 (COQ10) 100 MG CAPS Take 1 capsule by mouth daily.  . Misc Natural Products (GLUCOSAMINE CHOND MSM FORMULA PO) Take 1,500 mg by mouth.  . Multiple Vitamin (MULTI VITAMIN DAILY PO) Take 1 tablet by mouth.  . Omega-3 Fatty Acids (FISH OIL) 1200 MG CAPS Take 1 capsule by mouth.  . rosuvastatin (CRESTOR) 10 MG tablet Take 10 mg by mouth 2 (two) times a week. Monday, Thursday.  . triamterene-hydrochlorothiazide (MAXZIDE-25) 37.5-25 MG tablet TAKE 1/2 TABLET BY MOUTH EVERY DAY    FAMILY HISTORY:  Her indicated that her mother is deceased. She indicated that her father is deceased. She indicated that the status of her son is unknown. She indicated that the status of her other is unknown.    SOCIAL HISTORY: She  reports that she has never smoked. She has never used smokeless tobacco. She reports that she drinks alcohol. She reports that she does not use drugs.  REVIEW OF SYSTEMS:   As per the history of present illness, otherwise noncontributory  SUBJECTIVE:    VITAL SIGNS: BP (!) 88/56   Pulse 78   Temp 99.8 F (37.7 C) (Oral)   Resp (!) 21   Ht 5\' 4"  (1.626 m)   Wt 221 lb 12.5 oz (100.6 kg)   SpO2 96%   BMI 38.07 kg/m   HEMODYNAMICS:    VENTILATOR SETTINGS: FiO2 (%):  [55 %-67 %] 67 %  INTAKE / OUTPUT: I/O last  3 completed shifts: In: 3213.2 [I.V.:2263.2; IV Piggyback:950] Out: 1400 [Urine:1400]  PHYSICAL EXAMINATION: General: Well-developed well-nourished, no acute distress, HFNC Neuro: Cranial nerves intact, motor and sensory intact, DTRs symmetric HEENT: NCAT, sclerae white Cardiovascular: Regular, no murmurs noted Lungs: Left basilar rhonchi and crackles, right basilar crackles, no wheezes Abdomen: Obese, soft, nontender, bowel sounds present Extremities: Warm, no edema Skin: No lesions  noted  LABS:  BMET  Recent Labs Lab 09/12/16 0951 09/13/16 0412 09/14/16 0456  NA 134* 130* 134*  K 3.6 3.2* 3.4*  CL 96* 97* 95*  CO2 24 24 28   BUN 33* 40* 35*  CREATININE 1.79* 1.22* 1.60*  GLUCOSE 179* 127* 120*    Electrolytes  Recent Labs Lab 09/12/16 0951 09/13/16 0412 09/14/16 0456  CALCIUM 8.6* 7.6* 7.9*    CBC  Recent Labs Lab 09/12/16 0951 09/13/16 0412 09/14/16 0456  WBC 9.5 7.1 5.8  HGB 14.9 12.7 12.9  HCT 43.0 38.0 37.0  PLT 146* 110* 105*    Coag's  Recent Labs Lab 09/12/16 1353  APTT 33  INR 1.08    Sepsis Markers  Recent Labs Lab 09/12/16 0951 09/12/16 1353 09/14/16 0456  LATICACIDVEN 2.5* 1.7  --   PROCALCITON  --   --  14.92    ABG  Recent Labs Lab 09/13/16 1845 09/13/16 2249  PHART 7.48* 7.53*  PCO2ART 34 31*  PO2ART 50* 95    Liver Enzymes  Recent Labs Lab 09/12/16 0951 09/14/16 0456  AST 121* 94*  ALT 41 60*  ALKPHOS 53 42  BILITOT 1.0 0.8  ALBUMIN 3.5 2.5*    Cardiac Enzymes  Recent Labs Lab 09/12/16 1353 09/12/16 1828 09/14/16 0456  TROPONINI 47.03* 31.61* 12.13*    Glucose No results for input(s): GLUCAP in the last 168 hours.  Imaging Dg Chest Port 1 View  Result Date: 09/14/2016 CLINICAL DATA:  Respiratory failure EXAM: PORTABLE CHEST 1 VIEW COMPARISON:  09/13/2016 FINDINGS: Normal heart size. There is partial improvement in pulmonary edema pattern. There has been slightly improved aeration to the left midlung and left base. IMPRESSION: Decrease in pulmonary edema with mild improvement in aeration to the left midlung and left base. Electronically Signed   By: Kerby Moors M.D.   On: 09/14/2016 07:14   Dg Chest Port 1 View  Result Date: 09/13/2016 CLINICAL DATA:  Worsening shortness of breath EXAM: PORTABLE CHEST 1 VIEW COMPARISON:  Sep 12, 2016 FINDINGS: Focal infiltrate in the left lower lung is mildly worsened. No pneumothorax. Increased diffuse patchy opacity on the right. No other  change. IMPRESSION: The focal infiltrate in the left lower lobe is mildly worsened. There is new patchy opacity on the right with an interstitial component suspected. Whether this is developing multifocal pneumonia or asymmetric edema is unclear. The patient has no cardiomegaly however. Electronically Signed   By: Dorise Bullion III M.D   On: 09/13/2016 19:20    ASSESSMENT / PLAN: Acute hypoxemic respiratory failure Community-acquired pneumonia, no organism identified NSTEMI AKI Multiple antibiotic allergies Reported history of daily alcohol use  Continue supplemental oxygen to maintain SPO2 greater than 90% Continue current antibiotics-aztreonam and doxycycline (in light of multiple antibiotic allergies) Continue aspirin, heparin and current cardiac medications Cardiac catheterization planned for 05/07 (assuming that AKI improves) Low dose lorazepam as needed  Merton Border, MD PCCM service Mobile [phone] Pager [phone] 09/14/2016 4:59 PM

## 2016-09-14 NOTE — Progress Notes (Addendum)
Pt's BP is low (83/45 recheck: 98/61). Dr. Ubaldo Glassing at bedside making rounds. He stated to give pt a 560ml bolus of NS and to hold metoprolol at this time.

## 2016-09-14 NOTE — Progress Notes (Signed)
Pharmacy Antibiotic Note  Darlene Lawrence is a 76 y.o. female admitted on 09/12/2016 with  pneumonia.  Pharmacy has been consulted for Aztreonam dosing. Patient is also receiving doxycycline.   Plan: Will change to Azactam 1 g IV q8 hours due to reduction of renal function.   Height: 5\' 4"  (162.6 cm) Weight: 221 lb 12.5 oz (100.6 kg) IBW/kg (Calculated) : 54.7  Temp (24hrs), Avg:99.1 F (37.3 C), Min:97.3 F (36.3 C), Max:100.3 F (37.9 C)   Recent Labs Lab 09/12/16 0951 09/12/16 1353 09/13/16 0412 09/14/16 0456  WBC 9.5  --  7.1 5.8  CREATININE 1.79*  --  1.22* 1.60*  LATICACIDVEN 2.5* 1.7  --   --     Estimated Creatinine Clearance: 36.1 mL/min (A) (by C-G formula based on SCr of 1.6 mg/dL (H)).    Allergies  Allergen Reactions  . Cefixime     REACTION: Rash  . Levaquin [Levofloxacin] Other (See Comments)    Shoulder Bursitis  . Penicillins     REACTION: Rash    Antimicrobials this admission: 5/4 aztreonam >>  5/4 doxycycline >>   Dose adjustments this admission:  Microbiology results:  Thank you for allowing pharmacy to be a part of this patient's care.  Larene Beach, PharmD, BCPS Clinical Pharmacist 09/14/2016 9:49 AM

## 2016-09-14 NOTE — Progress Notes (Addendum)
ANTICOAGULATION CONSULT NOTE - Initial Consult  Pharmacy Consult for heparin bolus and drip Indication: chest pain/ACS  Allergies  Allergen Reactions  . Cefixime     REACTION: Rash  . Levaquin [Levofloxacin] Other (See Comments)    Shoulder Bursitis  . Penicillins     REACTION: Rash    Patient Measurements: Height: 5\' 4"  (162.6 cm) Weight: 221 lb 12.5 oz (100.6 kg) IBW/kg (Calculated) : 54.7 Heparin Dosing Weight: 76.8 kg  Vital Signs: Temp: 99.6 F (37.6 C) (05/05 2000) Temp Source: Axillary (05/05 2000) BP: 130/89 (05/06 0030) Pulse Rate: 96 (05/06 0030)  Labs:  Recent Labs  09/12/16 0951 09/12/16 1353 09/12/16 1828  09/13/16 0412 09/13/16 1319 09/13/16 2150  HGB 14.9  --   --   --  12.7  --   --   HCT 43.0  --   --   --  38.0  --   --   PLT 146*  --   --   --  110*  --   --   APTT  --  33  --   --   --   --   --   LABPROT  --  14.0  --   --   --   --   --   INR  --  1.08  --   --   --   --   --   HEPARINUNFRC  --   --   --   < > 0.26* 0.30 0.44  CREATININE 1.79*  --   --   --  1.22*  --   --   CKTOTAL  --  995*  --   --   --   --   --   TROPONINI 46.85* 47.03* 31.61*  --   --   --   --   < > = values in this interval not displayed.  Estimated Creatinine Clearance: 47.4 mL/min (A) (by C-G formula based on SCr of 1.22 mg/dL (H)).   Medical History: Past Medical History:  Diagnosis Date  . Abnormal Pap smear   . Arthritis   . breast cancer 2009   right  . Cancer ([place])   . DDD (degenerative disc disease), lumbar   . High cholesterol   . Hyperlipidemia   . Hypertension   . Meniscus tear    right  . Personal history of radiation therapy   . RBBB     Medications:  Scheduled:  . aspirin  325 mg Oral Daily  . budesonide (PULMICORT) nebulizer solution  0.5 mg Nebulization BID  . cholecalciferol  2,000 Units Oral BID  . ipratropium-albuterol  3 mL Nebulization Q6H  . metoprolol tartrate  25 mg Oral BID  . multivitamin with minerals  1 tablet Oral  q morning - 10a  . omega-3 acid ethyl esters  1 g Oral BID  . [START ON 09/15/2016] rosuvastatin  10 mg Oral Once per day on Mon Thu   Infusions:  . sodium chloride 10 mL/hr at 09/13/16 1956  . aztreonam Stopped (09/13/16 1830)  . doxycycline (VIBRAMYCIN) IV 100 mg (09/14/16 0015)  . heparin 1,100 Units/hr (09/13/16 0959)  . nitroGLYCERIN 5 mcg/min (09/13/16 1949)    Assessment: Pharmacy consulted to dose heparin bolus and drip in this 68 yoF with ACS. Per med rec, no PTA anticoag, baseline labs ordered. Troponin = 47 and 31.6  Goal of Therapy:  Heparin level 0.3-0.7 units/ml Monitor platelets by anticoagulation protocol: Yes   Plan:  Give  4000 units bolus x 1 Start heparin infusion at 950 units/hr Check anti-Xa level in 8 hours and daily while on heparin Continue to monitor H&H and platelets   5/4 22:30 heparin level 0.43. Continue current regimen, Recheck with AM labs to confirm.  5/5 AM heparin level 0.26. 1200 unit bolus and increase rate to 1100 units/hr. Recheck in 8 hours.  5/5: Heparin level resulted @ 0.30 @ 13:19. Will recheck Heparin level @ 21:30.   5/5 22:00 heparin level 0.44. Continue current regimen. Recheck heparin level and CBC with tomorrow AM labs.   Sim Boast, PharmD, BCPS  09/14/16 12:45 AM

## 2016-09-14 NOTE — Progress Notes (Signed)
[facility] [place] NAME: Darlene Lawrence    MR#:  [id number]  DATE OF BIRTH:  1940-09-26  SUBJECTIVE:  Came inw with sob, nausea and weakness. Decompensated last pm and now requring HFNC to maintain sats>92% Worsening penumonia  REVIEW OF SYSTEMS:   Review of Systems  Constitutional: Negative for chills, fever and weight loss.  HENT: Negative for ear discharge, ear pain and nosebleeds.   Eyes: Negative for blurred vision, pain and discharge.  Respiratory: Positive for cough and shortness of breath. Negative for sputum production, wheezing and stridor.   Cardiovascular: Negative for chest pain, palpitations, orthopnea and PND.  Gastrointestinal: Negative for abdominal pain, diarrhea, nausea and vomiting.  Genitourinary: Negative for frequency and urgency.  Musculoskeletal: Negative for back pain and joint pain.  Neurological: Positive for weakness. Negative for sensory change, speech change and focal weakness.  Psychiatric/Behavioral: Negative for depression and hallucinations. The patient is not nervous/anxious.    Tolerating Diet:some Tolerating PT: pending  DRUG ALLERGIES:   Allergies  Allergen Reactions  . Cefixime     REACTION: Rash  . Levaquin [Levofloxacin] Other (See Comments)    Shoulder Bursitis  . Penicillins     REACTION: Rash    VITALS:  Blood pressure 98/67, pulse 81, temperature 99.7 F (37.6 C), temperature source Oral, resp. rate (!) 24, height 5\' 4"  (1.626 m), weight 100.6 kg (221 lb 12.5 oz), SpO2 95 %.  PHYSICAL EXAMINATION:   Physical Exam  GENERAL:  76 y.o.-year-old patient lying in the bed with no acute distress. Appears ill EYES: Pupils equal, round, reactive to light and accommodation. No scleral icterus. Extraocular muscles intact.  HEENT: Head atraumatic, normocephalic. Oropharynx and nasopharynx clear.  NECK:  Supple, no jugular venous distention. No thyroid enlargement, no tenderness.   LUNGS decreased breath sounds bilaterally, no wheezing, rales, rhonchi. No use of accessory muscles of respiration.  CARDIOVASCULAR: S1, S2 normal. No murmurs, rubs, or gallops.  ABDOMEN: Soft, nontender, nondistended. Bowel sounds present. No organomegaly or mass.  EXTREMITIES: No cyanosis, clubbing or edema b/l.    NEUROLOGIC: Cranial nerves II through XII are intact. No focal Motor or sensory deficits b/l.   PSYCHIATRIC:  patient is alert but weak and sleepy  SKIN: No obvious rash, lesion, or ulcer.   LABORATORY PANEL:  CBC  Recent Labs Lab 09/14/16 0456  WBC 5.8  HGB 12.9  HCT 37.0  PLT 105*    Chemistries   Recent Labs Lab 09/14/16 0456  NA 134*  K 3.4*  CL 95*  CO2 28  GLUCOSE 120*  BUN 35*  CREATININE 1.60*  CALCIUM 7.9*  AST 94*  ALT 60*  ALKPHOS 42  BILITOT 0.8   Cardiac Enzymes  Recent Labs Lab 09/14/16 0456  TROPONINI 12.13*   RADIOLOGY:  Ct Angio Chest Pe W Or Wo Contrast  Result Date: 09/13/2016 CLINICAL DATA:  76 year old female with NSTEMI. Abnormal left lung on chest x-ray yesterday. EXAM: CT ANGIOGRAPHY CHEST WITH CONTRAST TECHNIQUE: Multidetector CT imaging of the chest was performed using the standard protocol during bolus administration of intravenous contrast. Multiplanar CT image reconstructions and MIPs were obtained to evaluate the vascular anatomy. CONTRAST:  60 mL Isovue 370 COMPARISON:  Chest radiographs 09/12/2016 and earlier. FINDINGS: Cardiovascular: Good contrast bolus timing in the pulmonary arterial tree. No focal filling defect identified in the pulmonary arteries to suggest acute pulmonary embolism. No pericardial effusion.  Mild Calcified aortic atherosclerosis. Mediastinum/Nodes: No mediastinal or hilar lymphadenopathy. Lungs/Pleura:  Subtotal consolidation of the left lower lobe. Small superimposed left pleural effusion, partially tracking in the left major fissure. Multifocal peripheral ground-glass and confluent opacity in the  left upper lobe. Mild peribronchial opacity in the lingula. Right lung mostly peribronchial multifocal opacity, but there is some peripheral opacity in the right upper lobe and middle lobe. Superimposed small right pleural effusion, mostly within the right major fissure. There is mild paraseptal emphysema in the right costophrenic angle (series 6, image 65). Upper Abdomen: Large simple fluid density exophytic left renal cyst is partially visible. Hepatic steatosis. Cholelithiasis. Negative visualized spleen, pancreas, adrenal glands, and bowel in the upper abdomen. Musculoskeletal: Mild mid thoracic vertebral body wedging appears to be chronic. Degenerative thoracic endplate spurring. Osteopenia. No acute osseous abnormality identified. Review of the MIP images confirms the above findings. IMPRESSION: 1.  No evidence of acute pulmonary embolus. 2. Bilateral multilobar pneumonia with severe left lower lobe consolidation. Small bilateral pleural effusions. 3. Fatty liver disease.  Cholelithiasis. 4. Mild Calcified aortic atherosclerosis. Electronically Signed   By: Genevie Ann M.D.   On: 09/13/2016 11:03   Dg Chest Port 1 View  Result Date: 09/14/2016 CLINICAL DATA:  Respiratory failure EXAM: PORTABLE CHEST 1 VIEW COMPARISON:  09/13/2016 FINDINGS: Normal heart size. There is partial improvement in pulmonary edema pattern. There has been slightly improved aeration to the left midlung and left base. IMPRESSION: Decrease in pulmonary edema with mild improvement in aeration to the left midlung and left base. Electronically Signed   By: Kerby Moors M.D.   On: 09/14/2016 07:14   Dg Chest Port 1 View  Result Date: 09/13/2016 CLINICAL DATA:  Worsening shortness of breath EXAM: PORTABLE CHEST 1 VIEW COMPARISON:  Sep 12, 2016 FINDINGS: Focal infiltrate in the left lower lung is mildly worsened. No pneumothorax. Increased diffuse patchy opacity on the right. No other change. IMPRESSION: The focal infiltrate in the left lower  lobe is mildly worsened. There is new patchy opacity on the right with an interstitial component suspected. Whether this is developing multifocal pneumonia or asymmetric edema is unclear. The patient has no cardiomegaly however. Electronically Signed   By: Dorise Bullion III M.D   On: 09/13/2016 19:20   ASSESSMENT AND PLAN:   Darlene Lawrence  is a 76 y.o. female presents with cough starting on Monday. She has been more lethargic and she slept all day on Wednesday and Thursday. She had some nausea vomiting and diarrhea that occurred on Wednesday and Thursday. She's had some disorientation. She's not eating or drinking. No complaints of chest pain or shortness of breath. Her cough is nonproductive  1. Acute NSTEMI  - Troponin 46.85---47.03--31.61 -Likely event happened on Wednesday or Thursday. Case discussed with Dr. Ubaldo Glassing cardiology. - Cardiac cath will be delayed until pt improves with respiratory condition  -IV heparin gtt, aspirin and metoprolol.   2.Acute Hypoxic respiraotry failure with LL Pneumonia with decreased air entry and acute systolic CHF -Start DuoNeb nebulizer solution with budesonide nebulizers.  -With multiple allergies cont with doxycycline and aztreonam for pneumonia. -Lasix prn  3. Acute kidney injury and dehydration we'll give IV fluid hydration.  4. Essential hypertension switched Dyazide over to metoprolol  5. Hyperlipidemia unspecified on low-dose Crestor.   6. History of breast cancer status post radiation therapy and lumpectomy in the past.   Case discussed with Care Management/Social Worker. Management plans discussed with the patient, family and they are in agreement.  CODE STATUS: Full  DVT Prophylaxis: on heparin gtt  TOTAL  TIME TAKING CARE OF THIS PATIENT: *30* minutes.  >50% time spent on counselling and coordination of care Dr Ubaldo Glassing, pt's husband  POSSIBLE D/C IN 1-2 DAYS, DEPENDING ON CLINICAL CONDITION.  Note: This dictation was prepared with  Dragon dictation along with smaller phrase technology. Any transcriptional errors that result from this process are unintentional.  Violett Hobbs M.D on 09/14/2016 at 2:18 PM  Between 7am to 6pm - Pager - [phone]  After 6pm go to [email or web address] - password [email or web address]  Office  [phone]  CC: Primary care physician; Jinny Sanders, MD

## 2016-09-14 NOTE — Progress Notes (Signed)
[facility]  SUBJECTIVE: dyspnaic on bipap   Vitals:   09/14/16 0900 09/14/16 0902 09/14/16 1000 09/14/16 1100  BP: (!) 83/45 98/61 100/67 (!) 95/57  Pulse: 79 84 81 81  Resp: (!) 23 (!) 22 (!) 22 (!) 22  Temp:      TempSrc:      SpO2: 93% 96% 97% 91%  Weight:      Height:        Intake/Output Summary (Last 24 hours) at 09/14/16 1343 Last data filed at 09/14/16 1341  Gross per 24 hour  Intake          2482.19 ml  Output             1675 ml  Net           807.19 ml    LABS: Basic Metabolic Panel:  Recent Labs  09/13/16 0412 09/14/16 0456  NA 130* 134*  K 3.2* 3.4*  CL 97* 95*  CO2 24 28  GLUCOSE 127* 120*  BUN 40* 35*  CREATININE 1.22* 1.60*  CALCIUM 7.6* 7.9*   Liver Function Tests:  Recent Labs  09/12/16 0951 09/14/16 0456  AST 121* 94*  ALT 41 60*  ALKPHOS 53 42  BILITOT 1.0 0.8  PROT 8.3* 6.7  ALBUMIN 3.5 2.5*   No results for input(s): LIPASE, AMYLASE in the last 72 hours. CBC:  Recent Labs  09/12/16 0951 09/13/16 0412 09/14/16 0456  WBC 9.5 7.1 5.8  NEUTROABS 8.0*  --   --   HGB 14.9 12.7 12.9  HCT 43.0 38.0 37.0  MCV 98.1 98.8 97.0  PLT 146* 110* 105*   Cardiac Enzymes:  Recent Labs  09/12/16 1353 09/12/16 1828 09/14/16 0456  CKTOTAL 995*  --   --   TROPONINI 47.03* 31.61* 12.13*   BNP: Invalid input(s): POCBNP D-Dimer: No results for input(s): DDIMER in the last 72 hours. Hemoglobin A1C: No results for input(s): HGBA1C in the last 72 hours. Fasting Lipid Panel:  Recent Labs  09/13/16 0412  CHOL 114  HDL 26*  LDLCALC 62  TRIG 129  CHOLHDL 4.4   Thyroid Function Tests: No results for input(s): TSH, T4TOTAL, T3FREE, THYROIDAB in the last 72 hours.  Invalid input(s): FREET3 Anemia Panel: No results for input(s): VITAMINB12, FOLATE, FERRITIN, TIBC, IRON, RETICCTPCT in the last 72 hours.   Physical Exam: Blood pressure (!) 95/57, pulse 81, temperature 97.3 F  (36.3 C), temperature source Axillary, resp. rate (!) 22, height 5\' 4"  (1.626 m), weight 100.6 kg (221 lb 12.5 oz), SpO2 91 %.   Wt Readings from Last 1 Encounters:  09/13/16 100.6 kg (221 lb 12.5 oz)     General appearance: alert, cooperative and mild distress Resp: diminished breath sounds bibasilar Cardio: regular rate and rhythm GI: soft, non-tender; bowel sounds normal; no masses,  no organomegaly Extremities: extremities normal, atraumatic, no cyanosis or edema Neurologic: Grossly normal  TELEMETRY: Reviewed telemetry pt in nsr  ASSESSMENT AND PLAN:  Active Problems:   NSTEMI (non-ST elevated myocardial infarction) (HCC)-troponin trending down. Suggests nstemi occurred several days prior to admission. On heparin, asa, metoprolol as blood pressure allows, statin. Will consider left heart cath when renal and pulmonary status allow. Will defer cardiac cath for tomorrow and reschedule when comorbid status improves.   CAP-on abx. Chest ct yesterday revealed no PE. Has lll pna with appears of bilataeral multilobar PNA. Continue abx. Was significantly hypoxic yesteraday.   CKD-creatinine 1.6  this am , 1.22 yesteray.    Teodoro Spray, MD, [facility] 09/14/2016 1:43 PM

## 2016-09-14 NOTE — Progress Notes (Signed)
Precedex paused for low blood pressure. Pt asleep at this time.

## 2016-09-14 NOTE — Progress Notes (Addendum)
Dr. Posey Pronto is rounding on pts. I asked her if the Q1 hour neuro checks could be discontinued as pt is less agitated and is now oriented x 4. Per Dr. Posey Pronto, Q1 hour neuro checks may be discontinued.

## 2016-09-14 NOTE — Progress Notes (Signed)
Initial Nutrition Assessment  DOCUMENTATION CODES:   Obesity unspecified  INTERVENTION:  1. Ordered pudding for patient 2. Continue to monitor for ONS needs  NUTRITION DIAGNOSIS:   Inadequate oral intake related to poor appetite, acute illness as evidenced by per patient/family report.  GOAL:   Patient will meet greater than or equal to 90% of their needs  MONITOR:   PO intake, I & O's, Labs, Weight trends  REASON FOR ASSESSMENT:   Malnutrition Screening Tool    ASSESSMENT:   Darlene Lawrence  is a 76 y.o. female presents with cough starting on Monday. She has been more lethargic and she slept all day on Wednesday and Thursday. She had some nausea vomiting and diarrhea that occurred on Wednesday and Thursday  Spoke with family at bedside. Patient has not eaten anything since Wednesday. Family claims she likely has lost weight but no evidence in chart, they could not provide an exact amount Prior to onset of illness - she was eating 3 meals a day, no complaints, concerns.  Patient lethargic, sleepy, on HFNC, but requested chocolate pudding.  No issues chewing/swallowing/choking. No nausea/vomiting at this time. Nutrition-Focused physical exam completed. Findings are no fat depletion, no muscle depletion, and no edema.  Labs and medications reviewed: Na 134, K 3.4 Vitamin D, MVI w/ Minerals, Omega 3s NS @ 72mL/hr  Diet Order:  Diet 2 gram sodium Room service appropriate? Yes; Fluid consistency: Thin  Skin:  Reviewed, no issues  Last BM:  09/11/2016  Height:   Ht Readings from Last 1 Encounters:  09/13/16 5\' 4"  (1.626 m)    Weight:   Wt Readings from Last 1 Encounters:  09/13/16 221 lb 12.5 oz (100.6 kg)    Ideal Body Weight:  54.54 kg  BMI:  Body mass index is 38.07 kg/m.  Estimated Nutritional Needs:   Kcal:  1650-1950 calories  Protein:  100-120 gm  Fluid:  >/= 1.7L  EDUCATION NEEDS:   No education needs identified at this time  Satira Anis.  Koral Thaden, MS, RD LDN Inpatient Clinical Dietitian Pager [phone]

## 2016-09-14 NOTE — Progress Notes (Signed)
Pt started on Precedex gtt after calling [facility] to request something for anxiety and agitation. Pulled out second iv and safety mitts. Stated earlier that she drinks wine every day. Xanax was ineffective.

## 2016-09-14 NOTE — Progress Notes (Signed)
ANTICOAGULATION CONSULT NOTE - Initial Consult  Pharmacy Consult for heparin bolus and drip Indication: chest pain/ACS  Allergies  Allergen Reactions  . Cefixime     REACTION: Rash  . Levaquin [Levofloxacin] Other (See Comments)    Shoulder Bursitis  . Penicillins     REACTION: Rash    Patient Measurements: Height: 5\' 4"  (162.6 cm) Weight: 221 lb 12.5 oz (100.6 kg) IBW/kg (Calculated) : 54.7 Heparin Dosing Weight: 76.8 kg  Vital Signs: Temp: 98.4 F (36.9 C) (05/06 0200) Temp Source: Axillary (05/06 0200) BP: 93/57 (05/06 0500) Pulse Rate: 72 (05/06 0500)  Labs:  Recent Labs  09/12/16 0951 09/12/16 1353 09/12/16 1828  09/13/16 0412 09/13/16 1319 09/13/16 2150 09/14/16 0456  HGB 14.9  --   --   --  12.7  --   --  12.9  HCT 43.0  --   --   --  38.0  --   --  37.0  PLT 146*  --   --   --  110*  --   --  105*  APTT  --  33  --   --   --   --   --   --   LABPROT  --  14.0  --   --   --   --   --   --   INR  --  1.08  --   --   --   --   --   --   HEPARINUNFRC  --   --   --   < > 0.26* 0.30 0.44 0.46  CREATININE 1.79*  --   --   --  1.22*  --   --  1.60*  CKTOTAL  --  995*  --   --   --   --   --   --   TROPONINI 46.85* 47.03* 31.61*  --   --   --   --  12.13*  < > = values in this interval not displayed.  Estimated Creatinine Clearance: 36.1 mL/min (A) (by C-G formula based on SCr of 1.6 mg/dL (H)).   Medical History: Past Medical History:  Diagnosis Date  . Abnormal Pap smear   . Arthritis   . breast cancer 2009   right  . Cancer ([place])   . DDD (degenerative disc disease), lumbar   . High cholesterol   . Hyperlipidemia   . Hypertension   . Meniscus tear    right  . Personal history of radiation therapy   . RBBB     Medications:  Scheduled:  . aspirin  325 mg Oral Daily  . budesonide (PULMICORT) nebulizer solution  0.5 mg Nebulization BID  . cholecalciferol  2,000 Units Oral BID  . ipratropium-albuterol  3 mL Nebulization Q6H  . metoprolol  tartrate  25 mg Oral BID  . multivitamin with minerals  1 tablet Oral q morning - 10a  . omega-3 acid ethyl esters  1 g Oral BID  . [START ON 09/15/2016] rosuvastatin  10 mg Oral Once per day on Mon Thu   Infusions:  . sodium chloride 10 mL/hr at 09/14/16 0305  . aztreonam Stopped (09/14/16 0329)  . dexmedetomidine (PRECEDEX) IV infusion Stopped (09/14/16 0420)  . doxycycline (VIBRAMYCIN) IV Stopped (09/14/16 0215)  . heparin 1,100 Units/hr (09/13/16 0959)  . nitroGLYCERIN Stopped (09/14/16 0323)    Assessment: Pharmacy consulted to dose heparin bolus and drip in this 33 yoF with ACS. Per med rec, no PTA anticoag, baseline  labs ordered. Troponin = 47 and 31.6  Goal of Therapy:  Heparin level 0.3-0.7 units/ml Monitor platelets by anticoagulation protocol: Yes   Plan:  Give 4000 units bolus x 1 Start heparin infusion at 950 units/hr Check anti-Xa level in 8 hours and daily while on heparin Continue to monitor H&H and platelets   5/4 22:30 heparin level 0.43. Continue current regimen, Recheck with AM labs to confirm.  5/5 AM heparin level 0.26. 1200 unit bolus and increase rate to 1100 units/hr. Recheck in 8 hours.  5/5: Heparin level resulted @ 0.30 @ 13:19. Will recheck Heparin level @ 21:30.   5/5 22:00 heparin level 0.44. Continue current regimen. Recheck heparin level and CBC with tomorrow AM labs.  5/6 AM heparin level 0.46. Continue current regimen. Recheck heparin level and CBC with tomorrow AM labs.   Sim Boast, PharmD, BCPS  09/14/16 5:56 AM

## 2016-09-15 ENCOUNTER — Encounter
Admission: EM | Disposition: A | Discharge status: Home Health | Payer: Self-pay | Source: Home / Self Care | Attending: Internal Medicine

## 2016-09-15 ENCOUNTER — Inpatient Hospital Stay: Payer: Medicare Other

## 2016-09-15 DIAGNOSIS — J189 Pneumonia, unspecified organism: Secondary | ICD-10-CM

## 2016-09-15 LAB — COMPREHENSIVE METABOLIC PANEL
ALK PHOS: 36 U/L — AB (ref 38–126)
ALT: 68 U/L — AB (ref 14–54)
AST: 89 U/L — AB (ref 15–41)
Albumin: 2.3 g/dL — ABNORMAL LOW (ref 3.5–5.0)
Anion gap: 9 (ref 5–15)
BUN: 38 mg/dL — AB (ref 6–20)
CALCIUM: 7.9 mg/dL — AB (ref 8.9–10.3)
CHLORIDE: 95 mmol/L — AB (ref 101–111)
CO2: 26 mmol/L (ref 22–32)
CREATININE: 1.42 mg/dL — AB (ref 0.44–1.00)
GFR calc non Af Amer: 36 mL/min — ABNORMAL LOW (ref >60)
GFR, EST AFRICAN AMERICAN: 41 mL/min — AB (ref >60)
Glucose, Bld: 114 mg/dL — ABNORMAL HIGH (ref 65–99)
Potassium: 3.3 mmol/L — ABNORMAL LOW (ref 3.5–5.1)
Sodium: 130 mmol/L — ABNORMAL LOW (ref 135–145)
Total Bilirubin: 0.8 mg/dL (ref 0.3–1.2)
Total Protein: 6.2 g/dL — ABNORMAL LOW (ref 6.5–8.1)

## 2016-09-15 LAB — CBC
HEMATOCRIT: 35.5 % (ref 35.0–47.0)
HEMOGLOBIN: 12.1 g/dL (ref 12.0–16.0)
MCH: 33 pg (ref 26.0–34.0)
MCHC: 34 g/dL (ref 32.0–36.0)
MCV: 97.2 fL (ref 80.0–100.0)
Platelets: 100 10*3/uL — ABNORMAL LOW (ref 150–440)
RBC: 3.65 MIL/uL — ABNORMAL LOW (ref 3.80–5.20)
RDW: 14.4 % (ref 11.5–14.5)
WBC: 5.6 10*3/uL (ref 3.6–11.0)

## 2016-09-15 LAB — POTASSIUM
POTASSIUM: 4.4 mmol/L (ref 3.5–5.1)
Potassium: 2.4 mmol/L — CL (ref 3.5–5.1)

## 2016-09-15 LAB — BRAIN NATRIURETIC PEPTIDE: B Natriuretic Peptide: 658 pg/mL — ABNORMAL HIGH (ref 0.0–100.0)

## 2016-09-15 LAB — C DIFFICILE QUICK SCREEN W PCR REFLEX
C DIFFICILE (CDIFF) TOXIN: NEGATIVE
C DIFFICLE (CDIFF) ANTIGEN: NEGATIVE
C Diff interpretation: NOT DETECTED

## 2016-09-15 LAB — PHOSPHORUS: Phosphorus: 1.4 mg/dL — ABNORMAL LOW (ref 2.5–4.6)

## 2016-09-15 LAB — MAGNESIUM: MAGNESIUM: 1.3 mg/dL — AB (ref 1.7–2.4)

## 2016-09-15 LAB — HEPARIN LEVEL (UNFRACTIONATED): Heparin Unfractionated: 0.52 IU/mL (ref 0.30–0.70)

## 2016-09-15 LAB — TROPONIN I: Troponin I: 3.28 ng/mL (ref <0.03)

## 2016-09-15 LAB — GLUCOSE, CAPILLARY: Glucose-Capillary: 103 mg/dL — ABNORMAL HIGH (ref 65–99)

## 2016-09-15 SURGERY — LEFT HEART CATH AND CORONARY ANGIOGRAPHY
Anesthesia: Moderate Sedation

## 2016-09-15 MED ORDER — ENSURE ENLIVE PO LIQD
237.0000 mL | ORAL | Status: DC
  Administered 2016-09-15 – 2016-09-22 (×11): 237 mL via ORAL

## 2016-09-15 MED ORDER — POTASSIUM PHOSPHATES 15 MMOLE/5ML IV SOLN
30.0000 mmol | Freq: Once | INTRAVENOUS | Status: AC
  Administered 2016-09-15: 30 mmol via INTRAVENOUS
  Filled 2016-09-15 (×2): qty 10

## 2016-09-15 MED ORDER — POTASSIUM CHLORIDE CRYS ER 20 MEQ PO TBCR
40.0000 meq | EXTENDED_RELEASE_TABLET | Freq: Once | ORAL | Status: AC
  Administered 2016-09-15: 40 meq via ORAL
  Filled 2016-09-15: qty 2

## 2016-09-15 MED ORDER — POTASSIUM CHLORIDE 10 MEQ/100ML IV SOLN
10.0000 meq | INTRAVENOUS | Status: AC
  Administered 2016-09-15 (×4): 10 meq via INTRAVENOUS
  Filled 2016-09-15 (×4): qty 100

## 2016-09-15 MED ORDER — MAGNESIUM SULFATE 4 GM/100ML IV SOLN
4.0000 g | Freq: Once | INTRAVENOUS | Status: AC
  Administered 2016-09-15: 4 g via INTRAVENOUS
  Filled 2016-09-15: qty 100

## 2016-09-15 MED ORDER — DOXYCYCLINE HYCLATE 100 MG PO TABS
100.0000 mg | ORAL_TABLET | Freq: Two times a day (BID) | ORAL | Status: AC
  Administered 2016-09-15 – 2016-09-19 (×10): 100 mg via ORAL
  Filled 2016-09-15 (×10): qty 1

## 2016-09-15 MED ORDER — ENSURE ENLIVE PO LIQD: 237.0000 mL | Freq: Two times a day (BID) | ORAL | Status: DC

## 2016-09-15 MED ORDER — POTASSIUM CHLORIDE 20 MEQ PO PACK
40.0000 meq | PACK | Freq: Once | ORAL | Status: AC
  Administered 2016-09-15: 40 meq via ORAL
  Filled 2016-09-15: qty 2

## 2016-09-15 NOTE — Progress Notes (Signed)
Call received from main lab re: critical potassium (2.4) and low magnesium and phosphorus. Dr. Mortimer Fries paged, on way to unit now to write new replacement orders.

## 2016-09-15 NOTE — Progress Notes (Signed)
Around shift of  assessment patient noted to have increased WOB, labored with accessory muscle use spO2 fluctuating in the 80s FiO2 increased more than once. Patient with increased agitation requested ativan. Brief changed after incontinent episodes and patient repositioned with some relief. Bipap in place at this time and patient resting quietly with unlabored breathing spo2 100% on 55% FiO2 with a rate of 10 on the bipap. Will continue to monitor.

## 2016-09-15 NOTE — Consult Note (Signed)
PULMONARY / CRITICAL CARE MEDICINE   Name: JAIELLE DLOUHY MRN: [id number] DOB: Jan 20, 1941    ADMISSION DATE:  09/12/2016 CONSULTATION DATE:  09/14/16  REFERRING MD:  Fritzi Mandes  PT PROFILE:  76 y.o. F never smoker adm 05/04 with cough, dyspnea, fever, anorexia, pulmonary infiltrate on CXR and elevated trop I  HISTORY OF PRESENT ILLNESS:   As above. Admitted by Hospitalist service with diagnoses of NSTEMI, PNA and AKI. She was transferred to the ICU/SDU with worsening respiratory failure and hypoxemia and was started on BiPAP. At the time of my evaluation today, she is comfortable on high flow nasal cannula oxygen and denies chest pain. She has cough which is largely nonproductive.Denies hemoptysis, LE edema and calf tenderness. She does report increasing fatigue and decreasing exercise tolerance over the last weeks to months.    SUBJECTIVE Patient with increased WOB and SOB Unable to complete sentences high isk for intubation Remains critically ill  Allergies  Allergen Reactions  . Cefixime     REACTION: Rash  . Levaquin [Levofloxacin] Other (See Comments)    Shoulder Bursitis  . Penicillins     REACTION: Rash    No current facility-administered medications on file prior to encounter.    Current Outpatient Prescriptions on File Prior to Encounter  Medication Sig  . Aspirin (ASPIR-81 PO) Take 1 tablet by mouth.  Marland Kitchen b complex vitamins tablet Take 1 tablet by mouth daily.  . Calcium Citrate-Vitamin D (CALCIUM CITRATE + PO) Take 1 tablet by mouth daily.  . Cholecalciferol (VITAMIN D3) 2000 UNITS TABS Take 1 capsule by mouth 2 (two) times daily.  . Coenzyme Q10 (COQ10) 100 MG CAPS Take 1 capsule by mouth daily.  . Misc Natural Products (GLUCOSAMINE CHOND MSM FORMULA PO) Take 1,500 mg by mouth.  . Multiple Vitamin (MULTI VITAMIN DAILY PO) Take 1 tablet by mouth.  . Omega-3 Fatty Acids (FISH OIL) 1200 MG CAPS Take 1 capsule by mouth.  . rosuvastatin (CRESTOR) 10 MG tablet Take  10 mg by mouth 2 (two) times a week. Monday, Thursday.  . triamterene-hydrochlorothiazide (MAXZIDE-25) 37.5-25 MG tablet TAKE 1/2 TABLET BY MOUTH EVERY DAY    REVIEW OF SYSTEMS:   Minimal due to critical illness  VITAL SIGNS: BP (!) 106/59   Pulse 83   Temp 99 F (37.2 C) (Oral)   Resp 20   Ht 5\' 4"  (1.626 m)   Wt 221 lb 12.5 oz (100.6 kg)   SpO2 93%   BMI 38.07 kg/m   HEMODYNAMICS:    VENTILATOR SETTINGS: FiO2 (%):  [55 %-67 %] 60 %  INTAKE / OUTPUT: I/O last 3 completed shifts: In: 2163.7 [I.V.:763.7; IV Piggyback:1400] Out: 1175 [PRFFM:3846]  PHYSICAL EXAMINATION: General: +distress Neuro: Cranial nerves intact, motor and sensory intact, DTRs symmetric HEENT: NCAT, sclerae white Cardiovascular: Regular, no murmurs noted Lungs: Left basilar rhonchi and crackles, right basilar crackles, no wheezes Abdomen: Obese, soft, nontender, bowel sounds present Extremities: Warm, no edema Skin: No lesions noted  LABS:  BMET  Recent Labs Lab 09/13/16 0412 09/14/16 0456 09/15/16 0320  NA 130* 134* 130*  K 3.2* 3.4* 3.3*  CL 97* 95* 95*  CO2 24 28 26   BUN 40* 35* 38*  CREATININE 1.22* 1.60* 1.42*  GLUCOSE 127* 120* 114*    Electrolytes  Recent Labs Lab 09/13/16 0412 09/14/16 0456 09/15/16 0320  CALCIUM 7.6* 7.9* 7.9*    CBC  Recent Labs Lab 09/13/16 0412 09/14/16 0456 09/15/16 0320  WBC 7.1 5.8 5.6  HGB  12.7 12.9 12.1  HCT 38.0 37.0 35.5  PLT 110* 105* 100*    Coag's  Recent Labs Lab 09/12/16 1353  APTT 33  INR 1.08    Sepsis Markers  Recent Labs Lab 09/12/16 0951 09/12/16 1353 09/14/16 0456  LATICACIDVEN 2.5* 1.7  --   PROCALCITON  --   --  14.92    ABG  Recent Labs Lab 09/13/16 1845 09/13/16 2249  PHART 7.48* 7.53*  PCO2ART 34 31*  PO2ART 50* 95    Liver Enzymes  Recent Labs Lab 09/12/16 0951 09/14/16 0456 09/15/16 0320  AST 121* 94* 89*  ALT 41 60* 68*  ALKPHOS 53 42 36*  BILITOT 1.0 0.8 0.8  ALBUMIN 3.5  2.5* 2.3*    Cardiac Enzymes  Recent Labs Lab 09/12/16 1828 09/14/16 0456 09/15/16 0320  TROPONINI 31.61* 12.13* 3.28*    Glucose No results for input(s): GLUCAP in the last 168 hours.  Imaging Dg Chest Port 1 View  Result Date: 09/15/2016 CLINICAL DATA:  Respiratory failure EXAM: PORTABLE CHEST 1 VIEW COMPARISON:  Sep 14, 2016 FINDINGS: There is patchy airspace consolidation throughout the left mid lower lung zones, essentially stable. There is slight atelectasis in the right mid lung and right base. Right lung is otherwise clear. Heart is upper normal in size with pulmonary venous hypertension. There is aortic atherosclerosis. No adenopathy. No bone lesions. IMPRESSION: Evidence of pulmonary vascular congestion. Probable pneumonia left mid lower lung zones, essentially stable. Mild atelectasis right mid lower lung zones. Aortic atherosclerosis. Electronically Signed   By: Lowella Grip III M.D.   On: 09/15/2016 07:17    ASSESSMENT / PLAN: Acute hypoxemic respiratory failure Community-acquired pneumonia, no organism identified NSTEMI AKI Multiple antibiotic allergies Reported history of daily alcohol use  High risk for intubation, remains on high amounts of oxygen fio2 at 65%  Continue supplemental oxygen to maintain SPO2 greater than 90% Continue current antibiotics-aztreonam and doxycycline (in light of multiple antibiotic allergies) Continue aspirin, heparin and current cardiac medications Cardiac catheterization planned for 05/07 (assuming that AKI improves) Low dose lorazepam as needed    Critical Care Time devoted to patient care services described in this note is 35 minutes.   Overall, patient is critically ill, prognosis is guarded.  Patient with Multiorgan failure and at high risk for cardiac arrest and death.    Corrin Parker, M.D.  [facility]  Medical Director [place] Director [facility] Cardio-Pulmonary  Department

## 2016-09-15 NOTE — Progress Notes (Signed)
[facility] for Electrolyte Monitoring and Replacement Indication: Hypokalemia, hypomagnesemia, hypophosphatemia  Allergies  Allergen Reactions  . Cefixime     REACTION: Rash  . Levaquin [Levofloxacin] Other (See Comments)    Shoulder Bursitis  . Penicillins     REACTION: Rash    Patient Measurements: Height: 5\' 4"  (162.6 cm) Weight: 221 lb 12.5 oz (100.6 kg) IBW/kg (Calculated) : 54.7  Vital Signs: Temp: 98.9 F (37.2 C) (05/07 0800) Temp Source: Oral (05/07 0800) BP: 99/69 (05/07 1300) Pulse Rate: 84 (05/07 1300) Intake/Output from previous day: 05/06 0701 - 05/07 0700 In: 1604 [I.V.:504; IV Piggyback:1100] Out: 925 [Urine:925] Intake/Output from this shift: Total I/O In: 325 [P.O.:220; I.V.:105] Out: -   Labs:  Recent Labs  09/12/16 1353 09/13/16 0412 09/14/16 0456 09/15/16 0320  WBC  --  7.1 5.8 5.6  HGB  --  12.7 12.9 12.1  HCT  --  38.0 37.0 35.5  PLT  --  110* 105* 100*  APTT 33  --   --   --   CREATININE  --  1.22* 1.60* 1.42*  ALBUMIN  --   --  2.5* 2.3*  PROT  --   --  6.7 6.2*  AST  --   --  94* 89*  ALT  --   --  60* 68*  ALKPHOS  --   --  42 36*  BILITOT  --   --  0.8 0.8   Estimated Creatinine Clearance: 40.7 mL/min (A) (by C-G formula based on SCr of 1.42 mg/dL (H)).  Potassium  Date Value Ref Range Status  09/15/2016 2.4 (LL) 3.5 - 5.1 mmol/L Final    Comment:    CRITICAL RESULT CALLED TO, READ BACK BY AND VERIFIED WITH: BRAD CHRISMON AT 1317 ON 09/15/16 [place].   09/18/2014 3.9 3.5 - 5.1 mEq/L Final   Phosphorus  Date Value Ref Range Status  09/15/2016 1.4 (L) 2.5 - 4.6 mg/dL Final   Magnesium  Date Value Ref Range Status  09/15/2016 1.3 (L) 1.7 - 2.4 mg/dL Final   Calcium  Date Value Ref Range Status  09/15/2016 7.9 (L) 8.9 - 10.3 mg/dL Final  09/18/2014 9.7 8.4 - 10.4 mg/dL Final      Medical History: Past Medical History:  Diagnosis Date  . Abnormal Pap smear   . Arthritis   .  breast cancer 2009   right  . Cancer ([place])   . DDD (degenerative disc disease), lumbar   . High cholesterol   . Hyperlipidemia   . Hypertension   . Meniscus tear    right  . Personal history of radiation therapy   . RBBB     Assessment: 76 y/o F admitted with NSTEMI, PNA, and AKI. Patient transferred to ICU For BiPAP.   Plan:  Magnesium sulfate 4 g iv once, potassium chloride 40 meq once and 4 runs of 10 meq IV. Kphos 30 mmol iv once. Will recheck K at 1800. Kphos scheduled for 1830. Will hold off on rechecking phos until AM due to need to replace K first with critically low level.   Ulice Dash D 09/15/2016,1:34 PM

## 2016-09-15 NOTE — Progress Notes (Signed)
ANTICOAGULATION CONSULT NOTE - Initial Consult  Pharmacy Consult for heparin bolus and drip Indication: chest pain/ACS  Allergies  Allergen Reactions  . Cefixime     REACTION: Rash  . Levaquin [Levofloxacin] Other (See Comments)    Shoulder Bursitis  . Penicillins     REACTION: Rash    Patient Measurements: Height: 5\' 4"  (162.6 cm) Weight: 221 lb 12.5 oz (100.6 kg) IBW/kg (Calculated) : 54.7 Heparin Dosing Weight: 76.8 kg  Vital Signs: Temp: 99 F (37.2 C) (05/07 0200) Temp Source: Oral (05/07 0200) BP: 97/72 (05/07 0400) Pulse Rate: 85 (05/07 0400)  Labs:  Recent Labs  09/12/16 1353 09/12/16 1828  09/13/16 0412  09/13/16 2150 09/14/16 0456 09/15/16 0320  HGB  --   --   --  12.7  --   --  12.9 12.1  HCT  --   --   --  38.0  --   --  37.0 35.5  PLT  --   --   --  110*  --   --  105* 100*  APTT 33  --   --   --   --   --   --   --   LABPROT 14.0  --   --   --   --   --   --   --   INR 1.08  --   --   --   --   --   --   --   HEPARINUNFRC  --   --   < > 0.26*  < > 0.44 0.46 0.52  CREATININE  --   --   --  1.22*  --   --  1.60* 1.42*  CKTOTAL 995*  --   --   --   --   --   --   --   TROPONINI 47.03* 31.61*  --   --   --   --  12.13* 3.28*  < > = values in this interval not displayed.  Estimated Creatinine Clearance: 40.7 mL/min (A) (by C-G formula based on SCr of 1.42 mg/dL (H)).   Medical History: Past Medical History:  Diagnosis Date  . Abnormal Pap smear   . Arthritis   . breast cancer 2009   right  . Cancer ([place])   . DDD (degenerative disc disease), lumbar   . High cholesterol   . Hyperlipidemia   . Hypertension   . Meniscus tear    right  . Personal history of radiation therapy   . RBBB     Medications:  Scheduled:  . aspirin  325 mg Oral Daily  . chlorhexidine  15 mL Mouth Rinse BID  . cholecalciferol  2,000 Units Oral BID  . ipratropium-albuterol  3 mL Nebulization Q6H  . mouth rinse  15 mL Mouth Rinse q12n4p  . metoprolol tartrate  25  mg Oral BID  . multivitamin with minerals  1 tablet Oral q morning - 10a  . omega-3 acid ethyl esters  1 g Oral BID  . rosuvastatin  10 mg Oral Once per day on Mon Thu   Infusions:  . sodium chloride 10 mL/hr at 09/15/16 0400  . aztreonam Stopped (09/15/16 0330)  . doxycycline (VIBRAMYCIN) IV Stopped (09/15/16 0147)  . heparin 1,100 Units/hr (09/15/16 0400)  . nitroGLYCERIN Stopped (09/14/16 0323)    Assessment: Pharmacy consulted to dose heparin bolus and drip in this 52 yoF with ACS. Per med rec, no PTA anticoag, baseline labs ordered. Troponin = 47  and 31.6  Goal of Therapy:  Heparin level 0.3-0.7 units/ml Monitor platelets by anticoagulation protocol: Yes   Plan:  Give 4000 units bolus x 1 Start heparin infusion at 950 units/hr Check anti-Xa level in 8 hours and daily while on heparin Continue to monitor H&H and platelets   5/4 22:30 heparin level 0.43. Continue current regimen, Recheck with AM labs to confirm.  5/5 AM heparin level 0.26. 1200 unit bolus and increase rate to 1100 units/hr. Recheck in 8 hours.  5/5: Heparin level resulted @ 0.30 @ 13:19. Will recheck Heparin level @ 21:30.   5/5 22:00 heparin level 0.44. Continue current regimen. Recheck heparin level and CBC with tomorrow AM labs.  5/6 AM heparin level 0.46. Continue current regimen. Recheck heparin level and CBC with tomorrow AM labs.  5/7 AM heparin level 0.52. Continue current regimen. Recheck heparin level and CBC with tomorrow AM labs.    Sim Boast, PharmD, BCPS  09/15/16 4:57 AM

## 2016-09-15 NOTE — Progress Notes (Signed)
Pharmacy Antibiotic Note  Darlene Lawrence is a 76 y.o. female admitted on 09/12/2016 with  pneumonia.  Pharmacy has been consulted for Aztreonam dosing. Patient is also receiving doxycycline.   Plan: Continue aztreonam 1 g iv q 8 hours.   Height: 5\' 4"  (162.6 cm) Weight: 221 lb 12.5 oz (100.6 kg) IBW/kg (Calculated) : 54.7  Temp (24hrs), Avg:99.6 F (37.6 C), Min:99 F (37.2 C), Max:99.9 F (37.7 C)   Recent Labs Lab 09/12/16 0951 09/12/16 1353 09/13/16 0412 09/14/16 0456 09/15/16 0320  WBC 9.5  --  7.1 5.8 5.6  CREATININE 1.79*  --  1.22* 1.60* 1.42*  LATICACIDVEN 2.5* 1.7  --   --   --     Estimated Creatinine Clearance: 40.7 mL/min (A) (by C-G formula based on SCr of 1.42 mg/dL (H)).    Allergies  Allergen Reactions  . Cefixime     REACTION: Rash  . Levaquin [Levofloxacin] Other (See Comments)    Shoulder Bursitis  . Penicillins     REACTION: Rash    Antimicrobials this admission: 5/4 aztreonam >>  5/4 doxycycline >>   Dose adjustments this admission:  Microbiology results:  Thank you for allowing pharmacy to be a part of this patient's care.  Napoleon Form, PharmD, BCPS Clinical Pharmacist 09/15/2016 11:19 AM

## 2016-09-15 NOTE — Progress Notes (Signed)
PHARMACIST - PHYSICIAN COMMUNICATION DR:   Mortimer Fries CONCERNING: Antibiotic IV to Oral Route Change Policy  RECOMMENDATION: This patient is receiving doxycycline by the intravenous route.  Based on criteria approved by the Pharmacy and Therapeutics Committee, the antibiotic(s) is/are being converted to the equivalent oral dose form(s).   DESCRIPTION: These criteria include:  Patient being treated for a respiratory tract infection, urinary tract infection, cellulitis or clostridium difficile associated diarrhea if on metronidazole  The patient is not neutropenic and does not exhibit a GI malabsorption state  The patient is eating (either orally or via tube) and/or has been taking other orally administered medications for a least 24 hours  The patient is improving clinically and has a Tmax < 100.5  If you have questions about this conversion, please contact the Pharmacy Department  []   [phone] )  [facility] [x]   [phone] )  [facility] []   [phone] )  [facility] []   [phone] )  [facility] []   [phone] )  [place], PharmD Clinical Pharmacist

## 2016-09-15 NOTE — Progress Notes (Signed)
Darlene Lawrence at [place] NAME: Darlene Lawrence    MR#:  [id number]  DATE OF BIRTH:  [birth year]  SUBJECTIVE:  Came inw with sob, nausea and weakness. Decompensated last pm and now requring 67 % HFNC to maintain sats>92% Worsening pneumonia Son in the room  REVIEW OF SYSTEMS:   Review of Systems  Constitutional: Negative for chills, fever and weight loss.  HENT: Negative for ear discharge, ear pain and nosebleeds.   Eyes: Negative for blurred vision, pain and discharge.  Respiratory: Positive for cough and shortness of breath. Negative for sputum production, wheezing and stridor.   Cardiovascular: Negative for chest pain, palpitations, orthopnea and PND.  Gastrointestinal: Negative for abdominal pain, diarrhea, nausea and vomiting.  Genitourinary: Negative for frequency and urgency.  Musculoskeletal: Negative for back pain and joint pain.  Neurological: Positive for weakness. Negative for sensory change, speech change and focal weakness.  Psychiatric/Behavioral: Negative for depression and hallucinations. The patient is not nervous/anxious.    Tolerating Diet:some Tolerating PT: pending  DRUG ALLERGIES:   Allergies  Allergen Reactions  . Cefixime     REACTION: Rash  . Levaquin [Levofloxacin] Other (See Comments)    Shoulder Bursitis  . Penicillins     REACTION: Rash    VITALS:  Blood pressure (!) 81/54, pulse 69, temperature 98.9 F (37.2 C), temperature source Oral, resp. rate (!) 27, height 5\' 4"  (1.626 m), weight 100.6 kg (221 lb 12.5 oz), SpO2 95 %.  PHYSICAL EXAMINATION:   Physical Exam  GENERAL:  76 y.o.-year-old patient lying in the bed with no acute distress. Appears ill EYES: Pupils equal, round, reactive to light and accommodation. No scleral icterus. Extraocular muscles intact.  HEENT: Head atraumatic, normocephalic. Oropharynx and nasopharynx clear.  NECK:  Supple, no jugular venous distention. No thyroid  enlargement, no tenderness.  LUNGS decreased breath sounds bilaterally, no wheezing, rales, rhonchi. No use of accessory muscles of respiration.  CARDIOVASCULAR: S1, S2 normal. No murmurs, rubs, or gallops.  ABDOMEN: Soft, nontender, nondistended. Bowel sounds present. No organomegaly or mass.  EXTREMITIES: No cyanosis, clubbing or edema b/l.    NEUROLOGIC: Cranial nerves II through XII are intact. No focal Motor or sensory deficits b/l.   PSYCHIATRIC:  patient is alert but weak and sleepy  SKIN: No obvious rash, lesion, or ulcer.   LABORATORY PANEL:  CBC  Recent Labs Lab 09/15/16 0320  WBC 5.6  HGB 12.1  HCT 35.5  PLT 100*    Chemistries   Recent Labs Lab 09/15/16 0320 09/15/16 1244  NA 130*  --   K 3.3* 2.4*  CL 95*  --   CO2 26  --   GLUCOSE 114*  --   BUN 38*  --   CREATININE 1.42*  --   CALCIUM 7.9*  --   MG  --  1.3*  AST 89*  --   ALT 68*  --   ALKPHOS 36*  --   BILITOT 0.8  --    Cardiac Enzymes  Recent Labs Lab 09/15/16 0320  TROPONINI 3.28*   RADIOLOGY:  Dg Chest Port 1 View  Result Date: 09/15/2016 CLINICAL DATA:  Respiratory failure EXAM: PORTABLE CHEST 1 VIEW COMPARISON:  Sep 14, 2016 FINDINGS: There is patchy airspace consolidation throughout the left mid lower lung zones, essentially stable. There is slight atelectasis in the right mid lung and right base. Right lung is otherwise clear. Heart is upper normal in size with pulmonary venous hypertension. There is  aortic atherosclerosis. No adenopathy. No bone lesions. IMPRESSION: Evidence of pulmonary vascular congestion. Probable pneumonia left mid lower lung zones, essentially stable. Mild atelectasis right mid lower lung zones. Aortic atherosclerosis. Electronically Signed   By: Lowella Grip III M.D.   On: 09/15/2016 07:17   Dg Chest Port 1 View  Result Date: 09/14/2016 CLINICAL DATA:  Respiratory failure EXAM: PORTABLE CHEST 1 VIEW COMPARISON:  09/13/2016 FINDINGS: Normal heart size. There is  partial improvement in pulmonary edema pattern. There has been slightly improved aeration to the left midlung and left base. IMPRESSION: Decrease in pulmonary edema with mild improvement in aeration to the left midlung and left base. Electronically Signed   By: Kerby Moors M.D.   On: 09/14/2016 07:14   Dg Chest Port 1 View  Result Date: 09/13/2016 CLINICAL DATA:  Worsening shortness of breath EXAM: PORTABLE CHEST 1 VIEW COMPARISON:  Sep 12, 2016 FINDINGS: Focal infiltrate in the left lower lung is mildly worsened. No pneumothorax. Increased diffuse patchy opacity on the right. No other change. IMPRESSION: The focal infiltrate in the left lower lobe is mildly worsened. There is new patchy opacity on the right with an interstitial component suspected. Whether this is developing multifocal pneumonia or asymmetric edema is unclear. The patient has no cardiomegaly however. Electronically Signed   By: Dorise Bullion III M.D   On: 09/13/2016 19:20   ASSESSMENT AND PLAN:   Darlene Lawrence  is a 76 y.o. female presents with cough starting on Monday. She has been more lethargic and she slept all day on Wednesday and Thursday. She had some nausea vomiting and diarrhea that occurred on Wednesday and Thursday. She's had some disorientation. She's not eating or drinking. No complaints of chest pain or shortness of breath. Her cough is nonproductive  1. Acute NSTEMI  - Troponin 46.85---47.03--31.61 -Likely event happened on Wednesday or Thursday. Case discussed with Dr. Ubaldo Glassing cardiology. - Cardiac cath will be delayed until pt improves with respiratory condition  -IV heparin gtt, aspirin and metoprolol.   2.Acute Hypoxic respiraotry failure with LL Pneumonia with decreased air entry and acute systolic CHF -Start DuoNeb nebulizer solution with budesonide nebulizers.  -With multiple allergies  Will cont with IV doxycycline and aztreonam for pneumonia. -Lasix 20 mg daily  3. Acute kidney injury  -initially  received IVF -Avoid nephrotoxins -Came in with creatinine to 1.6--- 1.42  4. Essential hypertension switched Dyazide over to metoprolol  5. Hyperlipidemia unspecified on low-dose Crestor.   6. History of breast cancer status post radiation therapy and lumpectomy in the past.  7. Electrolyte abnormality with hypokalemia hypophosphatemia hypomagnesemia -Pharmacy consult to repeat electrolytes  Discussed with son in the room   Case discussed with Care Management/Social Worker. Management plans discussed with the patient, family and they are in agreement.  CODE STATUS: Full  DVT Prophylaxis: on heparin gtt  TOTAL TIME TAKING CARE OF THIS PATIENT: *30* minutes.  >50% time spent on counselling and coordination of care Dr Ubaldo Glassing, pt's husband  POSSIBLE D/C IN 1-2 DAYS, DEPENDING ON CLINICAL CONDITION.  Note: This dictation was prepared with Dragon dictation along with smaller phrase technology. Any transcriptional errors that result from this process are unintentional.  Elfreida Heggs M.D on 09/15/2016 at 3:56 PM  Between 7am to 6pm - Pager - [phone]  After 6pm go to [email or web address] - password [facility]  Office  [phone]  CC: Primary care physician; Jinny Sanders, MD

## 2016-09-16 LAB — BASIC METABOLIC PANEL
Anion gap: 9 (ref 5–15)
BUN: 34 mg/dL — ABNORMAL HIGH (ref 6–20)
CHLORIDE: 96 mmol/L — AB (ref 101–111)
CO2: 25 mmol/L (ref 22–32)
Calcium: 7.8 mg/dL — ABNORMAL LOW (ref 8.9–10.3)
Creatinine, Ser: 1.1 mg/dL — ABNORMAL HIGH (ref 0.44–1.00)
GFR, EST AFRICAN AMERICAN: 56 mL/min — AB (ref >60)
GFR, EST NON AFRICAN AMERICAN: 49 mL/min — AB (ref >60)
Glucose, Bld: 116 mg/dL — ABNORMAL HIGH (ref 65–99)
POTASSIUM: 4.9 mmol/L (ref 3.5–5.1)
SODIUM: 130 mmol/L — AB (ref 135–145)

## 2016-09-16 LAB — CBC
HCT: 33.4 % — ABNORMAL LOW (ref 35.0–47.0)
HEMOGLOBIN: 11.6 g/dL — AB (ref 12.0–16.0)
MCH: 33.6 pg (ref 26.0–34.0)
MCHC: 34.7 g/dL (ref 32.0–36.0)
MCV: 96.8 fL (ref 80.0–100.0)
PLATELETS: 108 10*3/uL — AB (ref 150–440)
RBC: 3.45 MIL/uL — AB (ref 3.80–5.20)
RDW: 14.1 % (ref 11.5–14.5)
WBC: 5.5 10*3/uL (ref 3.6–11.0)

## 2016-09-16 LAB — PHOSPHORUS: PHOSPHORUS: 2.6 mg/dL (ref 2.5–4.6)

## 2016-09-16 LAB — MAGNESIUM: MAGNESIUM: 2.5 mg/dL — AB (ref 1.7–2.4)

## 2016-09-16 LAB — HEPARIN LEVEL (UNFRACTIONATED): HEPARIN UNFRACTIONATED: 0.39 [IU]/mL (ref 0.30–0.70)

## 2016-09-16 MED ORDER — ACETAMINOPHEN 650 MG RE SUPP
650.0000 mg | Freq: Once | RECTAL | Status: AC
  Administered 2016-09-16: 650 mg via RECTAL
  Filled 2016-09-16: qty 1

## 2016-09-16 NOTE — Progress Notes (Signed)
Patient states that she is feeling better today, O2 sats remained mostly within target range for the shift and patient also remained afebrile. Patient's appetite increased as patient was able to feed herself and  perform self-oral care today in addition to taking her oral medications without much exertion. Patient rested on and off throughout the shift and denied any pain this shift. Repositioned as needed for comfort and patient able to move herself some for comfort without too much exertion. Patient's spouse at bedside intermittently visiting with patient and providing support. Patient still incontinent, but able to vocalize the need for peri-care; incontinence care performed as needed.  IV abx given as ordered and Heparin drip still infusing at 11 units/hr per order. Patient and Spouse aware that patient is expected to be going to cardiac cath tomorrow.

## 2016-09-16 NOTE — Progress Notes (Signed)
[facility] [place] NAME: Darlene Lawrence    MR#:  [id number]  DATE OF BIRTH:  [birth year]  SUBJECTIVE:  Still cough and shortness of breath, on HF. REVIEW OF SYSTEMS:   Review of Systems  Constitutional: Negative for chills, fever and weight loss.  HENT: Negative for ear discharge, ear pain and nosebleeds.   Eyes: Negative for blurred vision, pain and discharge.  Respiratory: Positive for cough and shortness of breath. Negative for sputum production, wheezing and stridor.   Cardiovascular: Negative for chest pain, palpitations, orthopnea and PND.  Gastrointestinal: Negative for abdominal pain, diarrhea, nausea and vomiting.  Genitourinary: Negative for frequency and urgency.  Musculoskeletal: Negative for back pain and joint pain.  Neurological: Positive for weakness. Negative for sensory change, speech change and focal weakness.  Psychiatric/Behavioral: Negative for depression and hallucinations. The patient is not nervous/anxious.    Tolerating Diet:some Tolerating PT: pending  DRUG ALLERGIES:   Allergies  Allergen Reactions  . Cefixime     REACTION: Rash  . Levaquin [Levofloxacin] Other (See Comments)    Shoulder Bursitis  . Penicillins     REACTION: Rash    VITALS:  Blood pressure 127/67, pulse 86, temperature 98.4 F (36.9 C), temperature source Axillary, resp. rate (!) 24, height 5\' 4"  (1.626 m), weight 221 lb 12.5 oz (100.6 kg), SpO2 96 %.  PHYSICAL EXAMINATION:   Physical Exam  GENERAL:  76 y.o.-year-old patient lying in the bed with no acute distress. Appears ill EYES: Pupils equal, round, reactive to light and accommodation. No scleral icterus. Extraocular muscles intact.  HEENT: Head atraumatic, normocephalic. Oropharynx and nasopharynx clear.  NECK:  Supple, no jugular venous distention. No thyroid enlargement, no tenderness.  LUNGS decreased breath sounds bilaterally, mild wheezing, no rales, rhonchi. No  use of accessory muscles of respiration.  CARDIOVASCULAR: S1, S2 normal. No murmurs, rubs, or gallops.  ABDOMEN: Soft, nontender, nondistended. Bowel sounds present. No organomegaly or mass.  EXTREMITIES: No cyanosis, clubbing or edema b/l.    NEUROLOGIC: Cranial nerves II through XII are intact. No focal Motor or sensory deficits b/l.   PSYCHIATRIC:  patient is alert but weak. SKIN: No obvious rash, lesion, or ulcer.   LABORATORY PANEL:  CBC  Recent Labs Lab 09/16/16 0250  WBC 5.5  HGB 11.6*  HCT 33.4*  PLT 108*    Chemistries   Recent Labs Lab 09/15/16 0320  09/16/16 0250  NA 130*  --  130*  K 3.3*  < > 4.9  CL 95*  --  96*  CO2 26  --  25  GLUCOSE 114*  --  116*  BUN 38*  --  34*  CREATININE 1.42*  --  1.10*  CALCIUM 7.9*  --  7.8*  MG  --   < > 2.5*  AST 89*  --   --   ALT 68*  --   --   ALKPHOS 36*  --   --   BILITOT 0.8  --   --   < > = values in this interval not displayed. Cardiac Enzymes  Recent Labs Lab 09/15/16 0320  TROPONINI 3.28*   RADIOLOGY:  Dg Chest Port 1 View  Result Date: 09/15/2016 CLINICAL DATA:  Respiratory failure EXAM: PORTABLE CHEST 1 VIEW COMPARISON:  Sep 14, 2016 FINDINGS: There is patchy airspace consolidation throughout the left mid lower lung zones, essentially stable. There is slight atelectasis in the right mid lung and right base. Right lung is otherwise  clear. Heart is upper normal in size with pulmonary venous hypertension. There is aortic atherosclerosis. No adenopathy. No bone lesions. IMPRESSION: Evidence of pulmonary vascular congestion. Probable pneumonia left mid lower lung zones, essentially stable. Mild atelectasis right mid lower lung zones. Aortic atherosclerosis. Electronically Signed   By: Lowella Grip III M.D.   On: 09/15/2016 07:17   ASSESSMENT AND PLAN:   Darlene Lawrence  is a 76 y.o. female presents with cough starting on Monday. She has been more lethargic and she slept all day on Wednesday and Thursday. She  had some nausea vomiting and diarrhea that occurred on Wednesday and Thursday. She's had some disorientation. She's not eating or drinking. No complaints of chest pain or shortness of breath. Her cough is nonproductive  1. Acute NSTEMI  - Troponin 46.85---47.03--31.61-12.13-3.28. Per Dr. Ubaldo Glassing, Conintue with heparin. Defer cardiac cath until pulmonary status improved. Continue IV heparin gtt, aspirin and metoprolol.   2.Acute Hypoxic respiraotry failure with LL Pneumonia with decreased air entry and acute systolic CHF -Started DuoNeb nebulizer solution with budesonide nebulizers.  -With multiple allergies  Will cont with IV doxycycline and aztreonam for pneumonia. -Lasix 20 mg daily  3. Acute kidney injury  -initially received IVF -Avoid nephrotoxins -Came in with creatinine to 1.6--- 1.42 Improved.  * hyponatremia. Follow-up sodium level.  4. Essential hypertension switched Dyazide over to metoprolol  5. Hyperlipidemia unspecified on low-dose Crestor.   6. History of breast cancer status post radiation therapy and lumpectomy in the past.  7. Electrolyte abnormality with hypokalemia hypophosphatemia hypomagnesemia Improved.  Case discussed with Care Management/Social Worker. Management plans discussed with the patient, family and they are in agreement.  CODE STATUS: Full  DVT Prophylaxis: on heparin gtt  TOTAL TIME TAKING CARE OF THIS PATIENT: 38 minutes.  >50% time spent on counselling and coordination of care Dr Ubaldo Glassing, pt's husband  POSSIBLE D/C IN >3 DAYS, DEPENDING ON CLINICAL CONDITION.  Note: This dictation was prepared with Dragon dictation along with smaller phrase technology. Any transcriptional errors that result from this process are unintentional.  Demetrios Loll M.D on 09/16/2016 at 6:06 PM  Between 7am to 6pm - Pager - [phone]  After 6pm go to [email or web address] - password [facility]  Office  [phone]  CC: Primary care  physician; Jinny Sanders, MD

## 2016-09-16 NOTE — Consult Note (Signed)
PULMONARY / CRITICAL CARE MEDICINE   Name: Darlene Lawrence MRN: [id number] DOB: 1941-03-23    ADMISSION DATE:  09/12/2016 CONSULTATION DATE:  09/14/16  REFERRING MD:  Fritzi Mandes  PT PROFILE:  76 y.o. F never smoker adm 05/04 with cough, dyspnea, fever, anorexia, pulmonary infiltrate on CXR and elevated trop I  HISTORY OF PRESENT ILLNESS:   As above. Admitted by Hospitalist service with diagnoses of NSTEMI, PNA and AKI. She was transferred to the ICU/SDU with worsening respiratory failure and hypoxemia and was started on BiPAP. At the time of my evaluation today, she is comfortable on high flow nasal cannula oxygen and denies chest pain. She has cough which is largely nonproductive.Denies hemoptysis, LE edema and calf tenderness. She does report increasing fatigue and decreasing exercise tolerance over the last weeks to months.    SUBJECTIVE Feels better this AM Remains on high flow [place] 50% Plan for cardiac cath tomorrow   REVIEW OF SYSTEMS:    Review of Systems:  Gen:  Denies  fever, sweats, chills weigh loss   HEENT: Denies blurred vision, double vision, ear pain, eye pain, hearing loss, nose bleeds, sore throat  Cardiac:  No dizziness, chest pain or heaviness, chest tightness,edema  Resp:   + shortness of breath   Other:  All other systems negative   VITAL SIGNS: BP (!) 101/53 (BP Location: Left Arm)   Pulse 74   Temp 98.2 F (36.8 C) (Axillary)   Resp (!) 23   Ht 5\' 4"  (1.626 m)   Wt 221 lb 12.5 oz (100.6 kg)   SpO2 96%   BMI 38.07 kg/m   HEMODYNAMICS:    VENTILATOR SETTINGS: FiO2 (%):  [55 %-70 %] 55 %  INTAKE / OUTPUT: I/O last 3 completed shifts: In: 2174.5 [P.O.:577; I.V.:735; IV Piggyback:862.5] Out: 700 [Urine:700]  PHYSICAL EXAMINATION: General: no distress Neuro: Cranial nerves intact, motor and sensory intact, DTRs symmetric HEENT: NCAT, sclerae white Cardiovascular: Regular, no murmurs noted Lungs: Left basilar rhonchi and crackles, right  basilar crackles, no wheezes Abdomen: Obese, soft, nontender, bowel sounds present Extremities: Warm, no edema Skin: No lesions noted  LABS:  BMET  Recent Labs Lab 09/14/16 0456 09/15/16 0320 09/15/16 1244 09/15/16 1820 09/16/16 0250  NA 134* 130*  --   --  130*  K 3.4* 3.3* 2.4* 4.4 4.9  CL 95* 95*  --   --  96*  CO2 28 26  --   --  25  BUN 35* 38*  --   --  34*  CREATININE 1.60* 1.42*  --   --  1.10*  GLUCOSE 120* 114*  --   --  116*    Electrolytes  Recent Labs Lab 09/14/16 0456 09/15/16 0320 09/15/16 1244 09/16/16 0250  CALCIUM 7.9* 7.9*  --  7.8*  MG  --   --  1.3* 2.5*  PHOS  --   --  1.4* 2.6    CBC  Recent Labs Lab 09/14/16 0456 09/15/16 0320 09/16/16 0250  WBC 5.8 5.6 5.5  HGB 12.9 12.1 11.6*  HCT 37.0 35.5 33.4*  PLT 105* 100* 108*    Coag's  Recent Labs Lab 09/12/16 1353  APTT 33  INR 1.08    Sepsis Markers  Recent Labs Lab 09/12/16 0951 09/12/16 1353 09/14/16 0456  LATICACIDVEN 2.5* 1.7  --   PROCALCITON  --   --  14.92    ABG  Recent Labs Lab 09/13/16 1845 09/13/16 2249  PHART 7.48* 7.53*  PCO2ART 34  31*  PO2ART 50* 95    Liver Enzymes  Recent Labs Lab 09/12/16 0951 09/14/16 0456 09/15/16 0320  AST 121* 94* 89*  ALT 41 60* 68*  ALKPHOS 53 42 36*  BILITOT 1.0 0.8 0.8  ALBUMIN 3.5 2.5* 2.3*    Cardiac Enzymes  Recent Labs Lab 09/12/16 1828 09/14/16 0456 09/15/16 0320  TROPONINI 31.61* 12.13* 3.28*    Glucose  Recent Labs Lab 09/13/16 1903  GLUCAP 103*    Imaging No results found.  ASSESSMENT / PLAN: Acute hypoxemic respiratory failure Community-acquired pneumonia, no organism identified NSTEMI AKI Multiple antibiotic allergies Reported history of daily alcohol use   Continue supplemental oxygen to maintain SPO2 greater than 90%-wean off high flow [place] Continue current antibiotics-aztreonam and doxycycline (in light of multiple antibiotic allergies)-8 days total  Continue aspirin,  heparin and current cardiac medications Cardiac catheterization planned for 05/9 (assuming that AKI improves) Low dose lorazepam as needed   Corrin Parker, M.D.  [facility]  Medical Director [place] Director [place] Department

## 2016-09-16 NOTE — Plan of Care (Signed)
Problem: Safety: Goal: Ability to remain free from injury will improve Outcome: Progressing Patient Remained free from injury this shift  Problem: Skin Integrity: Goal: Risk for impaired skin integrity will decrease Non new skin breakdown noted. Patient still incontinent but able to vocalized episodes of incontinence, peri-care performed as needed  Problem: Activity: Goal: Risk for activity intolerance will decrease Outcome: Progressing Patient stating that she feels better today, O2 sats remaining with target range  Problem: Physical Regulation: Goal: Diagnostic test results will improve Outcome: Progressing Labs improving, Patient tentatively scheduled for Cardiac Cath tomorrow Goal: Signs and symptoms of infection will decrease Outcome: Progressing Patient afebrile this shift, no signs or symptoms of infection noted this shift

## 2016-09-16 NOTE — Progress Notes (Signed)
ANTICOAGULATION CONSULT NOTE - Initial Consult  Pharmacy Consult for heparin bolus and drip Indication: chest pain/ACS       Allergies  Allergen Reactions  . Cefixime     REACTION: Rash  . Levaquin [Levofloxacin] Other (See Comments)    Shoulder Bursitis  . Penicillins     REACTION: Rash    Patient Measurements: Height: 5\' 4"  (162.6 cm) Weight: 221 lb 12.5 oz (100.6 kg) IBW/kg (Calculated) : 54.7 Heparin Dosing Weight: 76.8 kg  Vital Signs: Temp: 99 F (37.2 C) (05/07 0200) Temp Source: Oral (05/07 0200) BP: 97/72 (05/07 0400) Pulse Rate: 85 (05/07 0400)  Labs:  Recent Labs (last 2 labs)    Recent Labs  09/12/16 1353 09/12/16 1828  09/13/16 0412  09/13/16 2150 09/14/16 0456 09/15/16 0320  HGB  --   --   --  12.7  --   --  12.9 12.1  HCT  --   --   --  38.0  --   --  37.0 35.5  PLT  --   --   --  110*  --   --  105* 100*  APTT 33  --   --   --   --   --   --   --   LABPROT 14.0  --   --   --   --   --   --   --   INR 1.08  --   --   --   --   --   --   --   HEPARINUNFRC  --   --   < > 0.26*  < > 0.44 0.46 0.52  CREATININE  --   --   --  1.22*  --   --  1.60* 1.42*  CKTOTAL 995*  --   --   --   --   --   --   --   TROPONINI 47.03* 31.61*  --   --   --   --  12.13* 3.28*  < > = values in this interval not displayed.    Estimated Creatinine Clearance: 40.7 mL/min (A) (by C-G formula based on SCr of 1.42 mg/dL (H)).   Medical History:     Past Medical History:  Diagnosis Date  . Abnormal Pap smear   . Arthritis   . breast cancer 2009   right  . Cancer ([place])   . DDD (degenerative disc disease), lumbar   . High cholesterol   . Hyperlipidemia   . Hypertension   . Meniscus tear    right  . Personal history of radiation therapy   . RBBB     Medications:  Scheduled:  . aspirin  325 mg Oral Daily  . chlorhexidine  15 mL Mouth Rinse BID  . cholecalciferol  2,000 Units Oral BID  . ipratropium-albuterol  3 mL  Nebulization Q6H  . mouth rinse  15 mL Mouth Rinse q12n4p  . metoprolol tartrate  25 mg Oral BID  . multivitamin with minerals  1 tablet Oral q morning - 10a  . omega-3 acid ethyl esters  1 g Oral BID  . rosuvastatin  10 mg Oral Once per day on Mon Thu   Infusions:  . sodium chloride 10 mL/hr at 09/15/16 0400  . aztreonam Stopped (09/15/16 0330)  . doxycycline (VIBRAMYCIN) IV Stopped (09/15/16 0147)  . heparin 1,100 Units/hr (09/15/16 0400)  . nitroGLYCERIN Stopped (09/14/16 0323)    Assessment: Pharmacy consulted to dose heparin bolus and  drip in this 4 yoF with ACS. Per med rec, no PTA anticoag, baseline labs ordered. Troponin = 47 and 31.6  Goal of Therapy:  Heparin level 0.3-0.7 units/ml Monitor platelets by anticoagulation protocol: Yes   Plan:  Give 4000 units bolus x 1 Start heparin infusion at 950 units/hr Check anti-Xa level in 8 hours and daily while on heparin Continue to monitor H&H and platelets   5/4 22:30 heparin level 0.43. Continue current regimen, Recheck with AM labs to confirm.  5/5 AM heparin level 0.26. 1200 unit bolus and increase rate to 1100 units/hr. Recheck in 8 hours.  5/5: Heparin level resulted @ 0.30 @ 13:19. Will recheck Heparin level @ 21:30.   5/5 22:00 heparin level 0.44. Continue current regimen. Recheck heparin level and CBC with tomorrow AM labs.  5/6 AM heparin level 0.46. Continue current regimen. Recheck heparin level and CBC with tomorrow AM labs.  5/7 AM heparin level 0.52. Continue current regimen. Recheck heparin level and CBC with tomorrow AM labs.  5/8 @ 0300 HL 0.39 therapeutic. Will continue current regimen and will recheck next HL w/ am labs.  Thank you for this consult.  Tobie Lords, PharmD, BCPS Clinical Pharmacist 09/16/2016

## 2016-09-16 NOTE — Progress Notes (Signed)
[facility]  SUBJECTIVE: lethargic this am after ativan   Vitals:   09/16/16 0300 09/16/16 0400 09/16/16 0500 09/16/16 0600  BP: 102/61 (!) 99/46 98/61 (!) 105/56  Pulse: 79 78 71 66  Resp: (!) 25 (!) 21 (!) 24 17  Temp: (!) 101 F (38.3 C)  98.7 F (37.1 C)   TempSrc: Axillary  Axillary   SpO2: 97% 98% 97% 97%  Weight:      Height:        Intake/Output Summary (Last 24 hours) at 09/16/16 0719 Last data filed at 09/16/16 0600  Gross per 24 hour  Intake           1622.5 ml  Output              350 ml  Net           1272.5 ml    LABS: Basic Metabolic Panel:  Recent Labs  09/15/16 0320 09/15/16 1244 09/15/16 1820 09/16/16 0250  NA 130*  --   --  130*  K 3.3* 2.4* 4.4 4.9  CL 95*  --   --  96*  CO2 26  --   --  25  GLUCOSE 114*  --   --  116*  BUN 38*  --   --  34*  CREATININE 1.42*  --   --  1.10*  CALCIUM 7.9*  --   --  7.8*  MG  --  1.3*  --  2.5*  PHOS  --  1.4*  --  2.6   Liver Function Tests:  Recent Labs  09/14/16 0456 09/15/16 0320  AST 94* 89*  ALT 60* 68*  ALKPHOS 42 36*  BILITOT 0.8 0.8  PROT 6.7 6.2*  ALBUMIN 2.5* 2.3*   No results for input(s): LIPASE, AMYLASE in the last 72 hours. CBC:  Recent Labs  09/15/16 0320 09/16/16 0250  WBC 5.6 5.5  HGB 12.1 11.6*  HCT 35.5 33.4*  MCV 97.2 96.8  PLT 100* 108*   Cardiac Enzymes:  Recent Labs  09/14/16 0456 09/15/16 0320  TROPONINI 12.13* 3.28*   BNP: Invalid input(s): POCBNP D-Dimer: No results for input(s): DDIMER in the last 72 hours. Hemoglobin A1C: No results for input(s): HGBA1C in the last 72 hours. Fasting Lipid Panel: No results for input(s): CHOL, HDL, LDLCALC, TRIG, CHOLHDL, LDLDIRECT in the last 72 hours. Thyroid Function Tests: No results for input(s): TSH, T4TOTAL, T3FREE, THYROIDAB in the last 72 hours.  Invalid input(s): FREET3 Anemia Panel: No results for input(s): VITAMINB12, FOLATE, FERRITIN, TIBC, IRON,  RETICCTPCT in the last 72 hours.   Physical Exam: Blood pressure (!) 105/56, pulse 66, temperature 98.7 F (37.1 C), temperature source Axillary, resp. rate 17, height 5\' 4"  (1.626 m), weight 100.6 kg (221 lb 12.5 oz), SpO2 97 %.   Wt Readings from Last 1 Encounters:  09/13/16 100.6 kg (221 lb 12.5 oz)     General appearance: lethargic Resp: dullness to percussion LLL and rhonchi bilaterally Cardio: regular rate and rhythm Neurologic: Mental status: alertness: lethargic  TELEMETRY: Reviewed telemetry pt in nsr:  ASSESSMENT AND PLAN:  Principal Problem:   Community acquired pneumonia-events of last pm noted. Pt dropped sats and work of breathing increased. Some improvement with anxiolytics and bipap. Current on high flow oxygen   Hemodynamics stable. Spiked temp last pm. Continue with abx. Likely will continue to defer cardiac cath until pulmonary and infectious status improved.  Active Problems:  NSTEMI (non-ST elevated myocardial infarction) (HCC)-as per above. Conintue with heparin. Defer cardiac cath until pulmonary status improved.  CKD-renal function improved.     Teodoro Spray, MD, [facility] 09/16/2016 7:19 AM

## 2016-09-16 NOTE — Progress Notes (Signed)
[place] for Electrolyte Monitoring and Replacement Indication: Hypokalemia, hypomagnesemia, hypophosphatemia  Allergies  Allergen Reactions  . Cefixime     REACTION: Rash  . Levaquin [Levofloxacin] Other (See Comments)    Shoulder Bursitis  . Penicillins     REACTION: Rash    Patient Measurements: Height: 5\' 4"  (162.6 cm) Weight: 221 lb 12.5 oz (100.6 kg) IBW/kg (Calculated) : 54.7  Vital Signs: Temp: 98.1 F (36.7 C) (05/08 1300) Temp Source: Oral (05/08 1300) BP: 96/65 (05/08 1400) Pulse Rate: 78 (05/08 1400) Intake/Output from previous day: 05/07 0701 - 05/08 0700 In: 1622.5 [P.O.:577; I.V.:483; IV Piggyback:562.5] Out: 350 [Urine:350] Intake/Output from this shift: Total I/O In: 916.8 [P.O.:837; I.V.:79.8] Out: -   Labs:  Recent Labs  09/14/16 0456 09/15/16 0320 09/15/16 1244 09/16/16 0250  WBC 5.8 5.6  --  5.5  HGB 12.9 12.1  --  11.6*  HCT 37.0 35.5  --  33.4*  PLT 105* 100*  --  108*  CREATININE 1.60* 1.42*  --  1.10*  MG  --   --  1.3* 2.5*  PHOS  --   --  1.4* 2.6  ALBUMIN 2.5* 2.3*  --   --   PROT 6.7 6.2*  --   --   AST 94* 89*  --   --   ALT 60* 68*  --   --   ALKPHOS 42 36*  --   --   BILITOT 0.8 0.8  --   --    Estimated Creatinine Clearance: 52.6 mL/min (A) (by C-G formula based on SCr of 1.1 mg/dL (H)).  Potassium  Date Value Ref Range Status  09/16/2016 4.9 3.5 - 5.1 mmol/L Final  09/18/2014 3.9 3.5 - 5.1 mEq/L Final   Phosphorus  Date Value Ref Range Status  09/16/2016 2.6 2.5 - 4.6 mg/dL Final   Magnesium  Date Value Ref Range Status  09/16/2016 2.5 (H) 1.7 - 2.4 mg/dL Final   Calcium  Date Value Ref Range Status  09/16/2016 7.8 (L) 8.9 - 10.3 mg/dL Final  09/18/2014 9.7 8.4 - 10.4 mg/dL Final      Medical History: Past Medical History:  Diagnosis Date  . Abnormal Pap smear   . Arthritis   . breast cancer 2009   right  . Cancer ([place])   . DDD (degenerative disc disease),  lumbar   . High cholesterol   . Hyperlipidemia   . Hypertension   . Meniscus tear    right  . Personal history of radiation therapy   . RBBB     Assessment: 75 y/o F admitted with NSTEMI, PNA, and AKI. Patient transferred to ICU For BiPAP.   Plan:  Will f/u AM labs.   Ulice Dash D 09/16/2016,3:26 PM

## 2016-09-16 NOTE — Progress Notes (Signed)
[facility]  SUBJECTIVE: lethargic   Vitals:   09/16/16 1200 09/16/16 1300 09/16/16 1333 09/16/16 1400  BP: 102/63 109/63  96/65  Pulse: 76 73  78  Resp: (!) 28 19  (!) 22  Temp:  98.1 F (36.7 C)    TempSrc:  Oral    SpO2: 94% 96% 93% 95%  Weight:      Height:        Intake/Output Summary (Last 24 hours) at 09/16/16 1535 Last data filed at 09/16/16 1116  Gross per 24 hour  Intake           1914.3 ml  Output              100 ml  Net           1814.3 ml    LABS: Basic Metabolic Panel:  Recent Labs  09/15/16 0320 09/15/16 1244 09/15/16 1820 09/16/16 0250  NA 130*  --   --  130*  K 3.3* 2.4* 4.4 4.9  CL 95*  --   --  96*  CO2 26  --   --  25  GLUCOSE 114*  --   --  116*  BUN 38*  --   --  34*  CREATININE 1.42*  --   --  1.10*  CALCIUM 7.9*  --   --  7.8*  MG  --  1.3*  --  2.5*  PHOS  --  1.4*  --  2.6   Liver Function Tests:  Recent Labs  09/14/16 0456 09/15/16 0320  AST 94* 89*  ALT 60* 68*  ALKPHOS 42 36*  BILITOT 0.8 0.8  PROT 6.7 6.2*  ALBUMIN 2.5* 2.3*   No results for input(s): LIPASE, AMYLASE in the last 72 hours. CBC:  Recent Labs  09/15/16 0320 09/16/16 0250  WBC 5.6 5.5  HGB 12.1 11.6*  HCT 35.5 33.4*  MCV 97.2 96.8  PLT 100* 108*   Cardiac Enzymes:  Recent Labs  09/14/16 0456 09/15/16 0320  TROPONINI 12.13* 3.28*   BNP: Invalid input(s): POCBNP D-Dimer: No results for input(s): DDIMER in the last 72 hours. Hemoglobin A1C: No results for input(s): HGBA1C in the last 72 hours. Fasting Lipid Panel: No results for input(s): CHOL, HDL, LDLCALC, TRIG, CHOLHDL, LDLDIRECT in the last 72 hours. Thyroid Function Tests: No results for input(s): TSH, T4TOTAL, T3FREE, THYROIDAB in the last 72 hours.  Invalid input(s): FREET3 Anemia Panel: No results for input(s): VITAMINB12, FOLATE, FERRITIN, TIBC, IRON, RETICCTPCT in the last 72 hours.   Physical Exam: Blood pressure  96/65, pulse 78, temperature 98.1 F (36.7 C), temperature source Oral, resp. rate (!) 22, height 5\' 4"  (1.626 m), weight 100.6 kg (221 lb 12.5 oz), SpO2 95 %.   Wt Readings from Last 1 Encounters:  09/13/16 100.6 kg (221 lb 12.5 oz)     General appearance: cooperative Resp: rhonchi bibasilar Cardio: regular rate and rhythm GI: soft, non-tender; bowel sounds normal; no masses,  no organomegaly Extremities: extremities normal, atraumatic, no cyanosis or edema Neurologic: Mental status: alertness: lethargic  TELEMETRY: Reviewed telemetry pt in nsr:  ASSESSMENT AND PLAN:  Principal Problem:   Community acquired pneumonia-spiked fever last pm. Afebrile at present. Will continue abx and follow. If increased temp before am, will defer cardiac cath until later.  Active Problems:   NSTEMI (non-ST elevated myocardial infarction) (HCC)-elevated troponin. On heparin. Will cath when afebrile and renal function stable.  Teodoro Spray, MD, [facility] 09/16/2016 3:35 PM

## 2016-09-16 NOTE — Progress Notes (Signed)
Pharmacy Antibiotic Note  Darlene Lawrence is a 76 y.o. female admitted on 09/12/2016 with  pneumonia.  Pharmacy has been consulted for Aztreonam dosing. Patient is also receiving doxycycline.   Plan: After discussion with Dr. Mortimer Fries, will continue abx for a total of 8 days of therapy.   Continue aztreonam 1 g iv q 8 hours.   Height: 5\' 4"  (162.6 cm) Weight: 221 lb 12.5 oz (100.6 kg) IBW/kg (Calculated) : 54.7  Temp (24hrs), Avg:99.2 F (37.3 C), Min:97.7 F (36.5 C), Max:101.2 F (38.4 C)   Recent Labs Lab 09/12/16 0951 09/12/16 1353 09/13/16 0412 09/14/16 0456 09/15/16 0320 09/16/16 0250  WBC 9.5  --  7.1 5.8 5.6 5.5  CREATININE 1.79*  --  1.22* 1.60* 1.42* 1.10*  LATICACIDVEN 2.5* 1.7  --   --   --   --     Estimated Creatinine Clearance: 52.6 mL/min (A) (by C-G formula based on SCr of 1.1 mg/dL (H)).    Allergies  Allergen Reactions  . Cefixime     REACTION: Rash  . Levaquin [Levofloxacin] Other (See Comments)    Shoulder Bursitis  . Penicillins     REACTION: Rash    Antimicrobials this admission: 5/4 aztreonam >>  5/4 doxycycline >>   Dose adjustments this admission:  Microbiology results: 5/5 MRSA PCR: negative 5/4 UCx: insignificant growth 5/4: BCx: NGTD 5/7 C diff: negative 5/8 BCx: NGTD 5/8 UCx: sent  Thank you for allowing pharmacy to be a part of this patient's care.  Napoleon Form, PharmD, BCPS Clinical Pharmacist 09/16/2016 3:32 PM

## 2016-09-16 NOTE — Care Management (Signed)
Met with patient to verify that patient is acutely on O2.

## 2016-09-17 ENCOUNTER — Encounter: Payer: Self-pay | Admitting: *Deleted

## 2016-09-17 ENCOUNTER — Encounter
Admission: EM | Disposition: A | Discharge status: Home Health | Payer: Self-pay | Source: Home / Self Care | Attending: Internal Medicine

## 2016-09-17 HISTORY — PX: LEFT HEART CATH AND CORONARY ANGIOGRAPHY: CATH118249

## 2016-09-17 LAB — HEPARIN LEVEL (UNFRACTIONATED): Heparin Unfractionated: 0.23 IU/mL — ABNORMAL LOW (ref 0.30–0.70)

## 2016-09-17 LAB — BASIC METABOLIC PANEL
Anion gap: 6 (ref 5–15)
BUN: 28 mg/dL — ABNORMAL HIGH (ref 6–20)
CHLORIDE: 102 mmol/L (ref 101–111)
CO2: 28 mmol/L (ref 22–32)
CREATININE: 0.82 mg/dL (ref 0.44–1.00)
Calcium: 8.1 mg/dL — ABNORMAL LOW (ref 8.9–10.3)
GFR calc non Af Amer: >60 mL/min (ref >60)
Glucose, Bld: 104 mg/dL — ABNORMAL HIGH (ref 65–99)
POTASSIUM: 4.3 mmol/L (ref 3.5–5.1)
SODIUM: 136 mmol/L (ref 135–145)

## 2016-09-17 LAB — CULTURE, BLOOD (ROUTINE X 2)
CULTURE: NO GROWTH
Culture: NO GROWTH
SPECIAL REQUESTS: ADEQUATE
Special Requests: ADEQUATE

## 2016-09-17 LAB — URINE CULTURE: CULTURE: NO GROWTH

## 2016-09-17 LAB — CBC
HCT: 31.1 % — ABNORMAL LOW (ref 35.0–47.0)
HEMOGLOBIN: 10.6 g/dL — AB (ref 12.0–16.0)
MCH: 33.4 pg (ref 26.0–34.0)
MCHC: 34.3 g/dL (ref 32.0–36.0)
MCV: 97.3 fL (ref 80.0–100.0)
PLATELETS: 110 10*3/uL — AB (ref 150–440)
RBC: 3.19 MIL/uL — AB (ref 3.80–5.20)
RDW: 14.3 % (ref 11.5–14.5)
WBC: 5.4 10*3/uL (ref 3.6–11.0)

## 2016-09-17 SURGERY — LEFT HEART CATH AND CORONARY ANGIOGRAPHY
Anesthesia: Moderate Sedation

## 2016-09-17 MED ORDER — ENOXAPARIN SODIUM 40 MG/0.4ML ~~LOC~~ SOLN
40.0000 mg | SUBCUTANEOUS | Status: DC
  Administered 2016-09-17 – 2016-09-21 (×5): 40 mg via SUBCUTANEOUS
  Filled 2016-09-17 (×5): qty 0.4

## 2016-09-17 MED ORDER — SODIUM CHLORIDE 0.9% FLUSH
3.0000 mL | Freq: Two times a day (BID) | INTRAVENOUS | Status: DC
  Administered 2016-09-17 – 2016-09-22 (×6): 3 mL via INTRAVENOUS

## 2016-09-17 MED ORDER — ONDANSETRON HCL 4 MG/2ML IJ SOLN: 4.0000 mg | Freq: Four times a day (QID) | INTRAMUSCULAR | Status: DC | PRN

## 2016-09-17 MED ORDER — SODIUM CHLORIDE 0.9% FLUSH: 3.0000 mL | INTRAVENOUS | Status: DC | PRN

## 2016-09-17 MED ORDER — SODIUM CHLORIDE 0.9% FLUSH: 3.0000 mL | Freq: Two times a day (BID) | INTRAVENOUS | Status: DC

## 2016-09-17 MED ORDER — SODIUM CHLORIDE 0.9% FLUSH
3.0000 mL | Freq: Two times a day (BID) | INTRAVENOUS | Status: DC
  Administered 2016-09-17 – 2016-09-22 (×8): 3 mL via INTRAVENOUS

## 2016-09-17 MED ORDER — HEPARIN BOLUS VIA INFUSION
1500.0000 [IU] | Freq: Once | INTRAVENOUS | Status: AC
  Administered 2016-09-17: 1500 [IU] via INTRAVENOUS
  Filled 2016-09-17: qty 1500

## 2016-09-17 MED ORDER — SODIUM CHLORIDE 0.9% FLUSH
3.0000 mL | INTRAVENOUS | Status: DC | PRN
  Administered 2016-09-19: 3 mL via INTRAVENOUS
  Filled 2016-09-17: qty 3

## 2016-09-17 MED ORDER — SODIUM CHLORIDE 0.9% FLUSH
3.0000 mL | INTRAVENOUS | Status: DC | PRN
  Administered 2016-09-21: 3 mL via INTRAVENOUS
  Filled 2016-09-17: qty 3

## 2016-09-17 MED ORDER — SODIUM CHLORIDE 0.9 % WEIGHT BASED INFUSION: 3.0000 mL/kg/h | INTRAVENOUS | Status: DC

## 2016-09-17 MED ORDER — ASPIRIN 81 MG PO CHEW: 81.0000 mg | CHEWABLE_TABLET | ORAL | Status: DC

## 2016-09-17 MED ORDER — SODIUM CHLORIDE 0.9 % IV SOLN: 250.0000 mL | INTRAVENOUS | Status: DC | PRN

## 2016-09-17 MED ORDER — IOPAMIDOL (ISOVUE-300) INJECTION 61%
INTRAVENOUS | Status: DC | PRN
  Administered 2016-09-17: 50 mL via INTRA_ARTERIAL

## 2016-09-17 MED ORDER — SODIUM CHLORIDE 0.9 % WEIGHT BASED INFUSION: 1.0000 mL/kg/h | INTRAVENOUS | Status: DC

## 2016-09-17 MED ORDER — ACETAMINOPHEN 325 MG PO TABS: 650.0000 mg | ORAL_TABLET | ORAL | Status: DC | PRN

## 2016-09-17 SURGICAL SUPPLY — 9 items
CATH 5FR JL4 DIAGNOSTIC (CATHETERS) ×3 IMPLANT
CATH INFINITI 5FR ANG PIGTAIL (CATHETERS) ×3 IMPLANT
CATH INFINITI JR4 5F (CATHETERS) ×3 IMPLANT
DEVICE CLOSURE MYNXGRIP 5F (Vascular Products) ×3 IMPLANT
GUIDEWIRE 3MM J TIP .035 145 (WIRE) ×3 IMPLANT
KIT MANI 3VAL PERCEP (MISCELLANEOUS) ×3 IMPLANT
NEEDLE PERC 18GX7CM (NEEDLE) ×3 IMPLANT
PACK CARDIAC CATH (CUSTOM PROCEDURE TRAY) ×3 IMPLANT
SHEATH AVANTI 5FR X 11CM (SHEATH) ×3 IMPLANT

## 2016-09-17 NOTE — Progress Notes (Signed)
Cardiac cath completed this am. Coronary arteries were essentially normal. LVEDP 16-70mmHg. Would discontinue heparin and continue to gently diurese and treat pneumonia.

## 2016-09-17 NOTE — Progress Notes (Signed)
Pt is not in distress. Bipap is not in room and pt doesn't wish to use the Bipap

## 2016-09-17 NOTE — Progress Notes (Signed)
Pt being transferred to room 253. Report called to Caryl Pina, Therapist, sports. Pt and belongings transferred to room 253 without incident.

## 2016-09-17 NOTE — Progress Notes (Signed)
Pt to cath lab.

## 2016-09-17 NOTE — Progress Notes (Signed)
Pharmacy Consult for heparin bolus and drip Indication: chest pain/ACS       Allergies  Allergen Reactions  . Cefixime     REACTION: Rash  . Levaquin [Levofloxacin] Other (See Comments)    Shoulder Bursitis  . Penicillins     REACTION: Rash    Patient Measurements: Height: 5\' 4"  (162.6 cm) Weight: 221 lb 12.5 oz (100.6 kg) IBW/kg (Calculated) : 54.7 Heparin Dosing Weight: 76.8 kg  Vital Signs: Temp: 99 F (37.2 C) (05/07 0200) Temp Source: Oral (05/07 0200) BP: 97/72 (05/07 0400) Pulse Rate: 85 (05/07 0400)  Labs:  Recent Labs (last 2 labs)    Recent Labs  09/12/16 1353 09/12/16 1828  09/13/16 0412  09/13/16 2150 09/14/16 0456 09/15/16 0320  HGB --  --  --  12.7 --  --  12.9 12.1  HCT --  --  --  38.0 --  --  37.0 35.5  PLT --  --  --  110* --  --  105* 100*  APTT 33 --  --  --  --  --  --  --   LABPROT 14.0 --  --  --  --  --  --  --   INR 1.08 --  --  --  --  --  --  --   HEPARINUNFRC --  --  <> 0.26* <> 0.44 0.46 0.52  CREATININE --  --  --  1.22* --  --  1.60* 1.42*  CKTOTAL 995* --  --  --  --  --  --  --   TROPONINI 47.03* 31.61* --  --  --  --  12.13* 3.28*  < > = values in this interval not displayed.    Estimated Creatinine Clearance: 40.7 mL/min (A) (by C-G formula based on SCr of 1.42 mg/dL (H)).   Medical History:     Past Medical History:  Diagnosis Date  . Abnormal Pap smear   . Arthritis   . breast cancer 2009   right  . Cancer ([place])   . DDD (degenerative disc disease), lumbar   . High cholesterol   . Hyperlipidemia   . Hypertension   . Meniscus tear    right  . Personal history of radiation therapy   . RBBB     Medications:  Scheduled:  . aspirin 325 mg Oral Daily  . chlorhexidine 15 mL Mouth Rinse BID  . cholecalciferol 2,000 Units Oral BID  . ipratropium-albuterol 3 mL Nebulization Q6H  . mouth rinse 15 mL Mouth  Rinse q12n4p  . metoprolol tartrate 25 mg Oral BID  . multivitamin with minerals 1 tablet Oral q morning - 10a  . omega-3 acid ethyl esters 1 g Oral BID  . rosuvastatin 10 mg Oral Once per day on Mon Thu   Infusions:  . sodium chloride 10 mL/hr at 09/15/16 0400  . aztreonam Stopped (09/15/16 0330)  . doxycycline (VIBRAMYCIN) IV Stopped (09/15/16 0147)  . heparin 1,100 Units/hr (09/15/16 0400)  . nitroGLYCERIN Stopped (09/14/16 0323)    Assessment: Pharmacy consulted to dose heparin bolus and drip in this 17 yoF with ACS. Per med rec, no PTA anticoag, baseline labs ordered. Troponin = 47 and 31.6  Goal of Therapy: Heparin level 0.3-0.7 units/ml Monitor platelets by anticoagulation protocol: Yes  Plan: Give 4000units bolus x 1 Start heparin infusion at 950units/hr Check anti-Xa level in 8hours and daily while on heparin Continue to monitor H&H and platelets  5/4 22:30 heparin level 0.43. Continue current regimen, Recheck with  AM labs to confirm.  5/5 AM heparin level 0.26. 1200 unit bolus and increase rate to 1100 units/hr. Recheck in 8 hours.  5/5: Heparin level resulted @ 0.30 @ 13:19. Will recheck Heparin level @ 21:30.   5/5 22:00 heparin level 0.44. Continue current regimen. Recheck heparin level and CBC with tomorrow AM labs.  5/6 AM heparin level 0.46. Continue current regimen. Recheck heparin level and CBC with tomorrow AM labs.  5/7AM heparin level 0.52. Continue current regimen. Recheck heparin level and CBC with tomorrow AM labs.  5/8 @ 0300 HL 0.39 therapeutic. Will continue current regimen and will recheck next HL w/ am labs.  5/9 @ 0424 HL 0.23 subtherapeutic. Will rebolus w/ heparin 1500 units IV x 1 and increase rate to 1200 units/hr and recheck HL @ 1300.   Thank you for this consult.  Tobie Lords, PharmD, BCPS Clinical Pharmacist 09/17/2016

## 2016-09-17 NOTE — Progress Notes (Signed)
Pt returned from cath lab.

## 2016-09-17 NOTE — Progress Notes (Addendum)
[facility] [place] NAME: Darlene Lawrence    MR#:  [id number]  DATE OF BIRTH:  Jan 12, 1941  SUBJECTIVE:  Better cough and shortness of breath, off HF, on O2 [place] 4L. s/p cardiac cath today. REVIEW OF SYSTEMS:   Review of Systems  Constitutional: Negative for chills, fever and weight loss.  HENT: Negative for ear discharge, ear pain and nosebleeds.   Eyes: Negative for blurred vision, pain and discharge.  Respiratory: Positive for cough and shortness of breath. Negative for sputum production, wheezing and stridor.   Cardiovascular: Negative for chest pain, palpitations, orthopnea and PND.  Gastrointestinal: Negative for abdominal pain, diarrhea, nausea and vomiting.  Genitourinary: Negative for frequency and urgency.  Musculoskeletal: Negative for back pain and joint pain.  Neurological: Positive for weakness. Negative for sensory change, speech change and focal weakness.  Psychiatric/Behavioral: Negative for depression and hallucinations. The patient is not nervous/anxious.    Tolerating Diet:some Tolerating PT: pending  DRUG ALLERGIES:   Allergies  Allergen Reactions  . Cefixime     REACTION: Rash  . Levaquin [Levofloxacin] Other (See Comments)    Shoulder Bursitis  . Penicillins     REACTION: Rash    VITALS:  Blood pressure (!) 104/56, pulse 72, temperature 98.2 F (36.8 C), temperature source Axillary, resp. rate (!) 21, height 5\' 4"  (1.626 m), weight 221 lb 12.5 oz (100.6 kg), SpO2 95 %.  PHYSICAL EXAMINATION:   Physical Exam  GENERAL:  76 y.o.-year-old patient lying in the bed with no acute distress. Obese and drowsy. EYES: Pupils equal, round, reactive to light and accommodation. No scleral icterus. Extraocular muscles intact.  HEENT: Head atraumatic, normocephalic. Oropharynx and nasopharynx clear.  NECK:  Supple, no jugular venous distention. No thyroid enlargement, no tenderness.  LUNGS decreased breath sounds  bilaterally, mild wheezing, no rales, rhonchi. No use of accessory muscles of respiration.  CARDIOVASCULAR: S1, S2 normal. No murmurs, rubs, or gallops.  ABDOMEN: Soft, nontender, nondistended. Bowel sounds present. No organomegaly or mass.  EXTREMITIES: No cyanosis, clubbing or edema b/l.    NEUROLOGIC: Cranial nerves II through XII are intact. No focal Motor or sensory deficits b/l.   PSYCHIATRIC:  patient is awake but drowsy. SKIN: No obvious rash, lesion, or ulcer.   LABORATORY PANEL:  CBC  Recent Labs Lab 09/17/16 0424  WBC 5.4  HGB 10.6*  HCT 31.1*  PLT 110*    Chemistries   Recent Labs Lab 09/15/16 0320  09/16/16 0250 09/17/16 0424  NA 130*  --  130* 136  K 3.3*  < > 4.9 4.3  CL 95*  --  96* 102  CO2 26  --  25 28  GLUCOSE 114*  --  116* 104*  BUN 38*  --  34* 28*  CREATININE 1.42*  --  1.10* 0.82  CALCIUM 7.9*  --  7.8* 8.1*  MG  --   < > 2.5*  --   AST 89*  --   --   --   ALT 68*  --   --   --   ALKPHOS 36*  --   --   --   BILITOT 0.8  --   --   --   < > = values in this interval not displayed. Cardiac Enzymes  Recent Labs Lab 09/15/16 0320  TROPONINI 3.28*   RADIOLOGY:  No results found. ASSESSMENT AND PLAN:   Darlene Lawrence  is a 76 y.o. female presents with cough starting  on Monday. She has been more lethargic and she slept all day on Wednesday and Thursday. She had some nausea vomiting and diarrhea that occurred on Wednesday and Thursday. She's had some disorientation. She's not eating or drinking. No complaints of chest pain or shortness of breath. Her cough is nonproductive  1. Acute NSTEMI  - Troponin 46.85---47.03--31.61-12.13-3.28. Per Dr. Ubaldo Glassing, Coronary arteries were essentially normal during cardiac cath, disconintue heparin.  continue aspirin and metoprolol.   2.Acute Hypoxic respiraotry failure with LL Pneumonia with decreased air entry and acute systolic CHF LV EF: 53% -   45%. -Started DuoNeb nebulizer solution with budesonide  nebulizers.  -With multiple allergies   cont with IV doxycycline and aztreonam for pneumonia. -Lasix 20 mg daily  3. Acute kidney injury  -initially received IVF -Avoid nephrotoxins -Came in with creatinine to 1.6--- 1.42 Improved.  * hyponatremia. Improved.  4. Essential hypertension switched Dyazide over to metoprolol  5. Hyperlipidemia unspecified on low-dose Crestor.   6. History of breast cancer status post radiation therapy and lumpectomy in the past.  7. Electrolyte abnormality with hypokalemia hypophosphatemia hypomagnesemia Improved.  PT evaluation tomorrow. Case discussed with Care Management/Social Worker. Management plans discussed with the patient, family and they are in agreement.  CODE STATUS: Full  DVT Prophylaxis: on lovenox SQ TOTAL TIME TAKING CARE OF THIS PATIENT: 37 minutes.  >50% time spent on counselling and coordination of care Dr Ubaldo Glassing, pt's husband  POSSIBLE D/C IN 2-3 DAYS, DEPENDING ON CLINICAL CONDITION.  Note: This dictation was prepared with Dragon dictation along with smaller phrase technology. Any transcriptional errors that result from this process are unintentional.  Demetrios Loll M.D on 09/17/2016 at 1:58 PM  Between 7am to 6pm - Pager - [phone]  After 6pm go to [email or web address] - password [email or web address]  Office  [phone]  CC: Primary care physician; Jinny Sanders, MD

## 2016-09-17 NOTE — Progress Notes (Signed)
[place]  SUBJECTIVE: feels better this am. Still on high flow oxygen per [place]   Vitals:   09/17/16 0300 09/17/16 0400 09/17/16 0500 09/17/16 0600  BP: (!) 89/55 118/66 107/64 114/67  Pulse: 83 82 82 82  Resp: (!) 25 19 18  (!) 27  Temp:      TempSrc:      SpO2: 96% 94% 92% 93%  Weight:      Height:        Intake/Output Summary (Last 24 hours) at 09/17/16 0654 Last data filed at 09/17/16 0600  Gross per 24 hour  Intake          1851.13 ml  Output                1 ml  Net          1850.13 ml    LABS: Basic Metabolic Panel:  Recent Labs  09/15/16 1244  09/16/16 0250 09/17/16 0424  NA  --   --  130* 136  K 2.4*  < > 4.9 4.3  CL  --   --  96* 102  CO2  --   --  25 28  GLUCOSE  --   --  116* 104*  BUN  --   --  34* 28*  CREATININE  --   --  1.10* 0.82  CALCIUM  --   --  7.8* 8.1*  MG 1.3*  --  2.5*  --   PHOS 1.4*  --  2.6  --   < > = values in this interval not displayed. Liver Function Tests:  Recent Labs  09/15/16 0320  AST 89*  ALT 68*  ALKPHOS 36*  BILITOT 0.8  PROT 6.2*  ALBUMIN 2.3*   No results for input(s): LIPASE, AMYLASE in the last 72 hours. CBC:  Recent Labs  09/16/16 0250 09/17/16 0424  WBC 5.5 5.4  HGB 11.6* 10.6*  HCT 33.4* 31.1*  MCV 96.8 97.3  PLT 108* 110*   Cardiac Enzymes:  Recent Labs  09/15/16 0320  TROPONINI 3.28*   BNP: Invalid input(s): POCBNP D-Dimer: No results for input(s): DDIMER in the last 72 hours. Hemoglobin A1C: No results for input(s): HGBA1C in the last 72 hours. Fasting Lipid Panel: No results for input(s): CHOL, HDL, LDLCALC, TRIG, CHOLHDL, LDLDIRECT in the last 72 hours. Thyroid Function Tests: No results for input(s): TSH, T4TOTAL, T3FREE, THYROIDAB in the last 72 hours.  Invalid input(s): FREET3 Anemia Panel: No results for input(s): VITAMINB12, FOLATE, FERRITIN, TIBC, IRON, RETICCTPCT in the last 72 hours.   Physical Exam: Blood  pressure 114/67, pulse 82, temperature 98.8 F (37.1 C), temperature source Oral, resp. rate (!) 27, height 5\' 4"  (1.626 m), weight 100.6 kg (221 lb 12.5 oz), SpO2 93 %.   Wt Readings from Last 1 Encounters:  09/13/16 100.6 kg (221 lb 12.5 oz)     General appearance: alert and cooperative Resp: diminished breath sounds LLL Cardio: regular rate and rhythm Extremities: extremities normal, atraumatic, no cyanosis or edema Neurologic: Grossly normal  TELEMETRY: Reviewed telemetry pt in nsr:  ASSESSMENT AND PLAN:  Principal Problem:   Community acquired pneumonia-slowly improving with abx. Still on 45% high flow per [place]/ Active Problems:   NSTEMI (non-ST elevated myocardial infarction) (HCC)-troponin 47 on admission. Will proceed with cath today  With lmited contrast if possible to evaluate anatomy to guide further therapy.     Teodoro Spray, MD, [facility] 09/17/2016  6:54 AM

## 2016-09-17 NOTE — Progress Notes (Signed)
Chaplain received an order to visit with pt in room IC20. Chaplain provided information for an Advanced Directive.    09/17/16 1320  Clinical Encounter Type  Visited With Patient  Visit Type Initial  Referral From Nurse  Consult/Referral To Chaplain  Spiritual Encounters  Spiritual Needs Other (Comment)

## 2016-09-17 NOTE — Care Management Note (Signed)
Case Management Note  Patient Details  Name: Darlene Lawrence MRN: [id number] Date of Birth: [birth year]  Subjective/Objective:                 Patient ruled in for nstemi and pneumonia with dehydration . Cardiac cath negative.   Current 02 requirements are acute.  has required HFNC at 45% within the last 24 hours.  Now on nasal cannula 4 liters. Unable to find a "chronic" diagnosis to qualify patient for home 02  covered by medicare.  PT consult pending. Independent in all adls, denies issues accessing medical care, obtaining medications or with transportation.  Current with her PCP.   Action/Plan:   Expected Discharge Date:                  Expected Discharge Plan:     In-House Referral:     Discharge planning Services     Post Acute Care Choice:    Choice offered to:     DME Arranged:    DME Agency:     HH Arranged:    HH Agency:     Status of Service:     If discussed at [employer] of Stay Meetings, dates discussed:    Additional Comments:  Katrina Stack, RN 09/17/2016, 3:29 PM

## 2016-09-18 LAB — BASIC METABOLIC PANEL
ANION GAP: 6 (ref 5–15)
BUN: 20 mg/dL (ref 6–20)
CALCIUM: 8.2 mg/dL — AB (ref 8.9–10.3)
CO2: 31 mmol/L (ref 22–32)
Chloride: 102 mmol/L (ref 101–111)
Creatinine, Ser: 0.7 mg/dL (ref 0.44–1.00)
GFR calc Af Amer: >60 mL/min (ref >60)
GLUCOSE: 93 mg/dL (ref 65–99)
Potassium: 4 mmol/L (ref 3.5–5.1)
Sodium: 139 mmol/L (ref 135–145)

## 2016-09-18 NOTE — Evaluation (Signed)
Physical Therapy Evaluation Patient Details Name: Darlene Lawrence MRN: [id number] DOB: 1941/02/28 Today's Date: 09/18/2016   History of Present Illness  76 y/o female here with weakness, nausea, vomiting.  She was found to have hard heart attack and pneumonia.  She was in the CCU for a few days and is now on the floor.  Pt with history of hip and knee replacements.   Clinical Impression  Pt showed good effort with PT exam despite not having done very much for the last week.  She was able to ambulate around the foot of the bed with a walker and 4 liters O2 (both of which she does not use at baseline).  Overall pt did well with mobility and did not need any assist, though she struggled much more than normal and was fatigued with the effort. On 4 liters O2 here sats were 96% at rest, 92% after getting up to sitting EOB and 90% after brief walk in room.  Pt showed good effort and desire to go home, she will need HHPT and more assist than her baseline until she regains some strength.     Follow Up Recommendations Home health PT    Equipment Recommendations       Recommendations for Other Services       Precautions / Restrictions Precautions Precautions: Fall Restrictions Weight Bearing Restrictions: No      Mobility  Bed Mobility Overal bed mobility: Modified Independent             General bed mobility comments: Pt needed heavy UE use to get to EOB, but was able to rise to sitting w/o assist  Transfers Overall transfer level: Modified independent Equipment used: Rolling walker (2 wheeled)             General transfer comment: Pt typically does not need an AD, but is feeling very weak and requested to use it.  She did seem reliant on it to maintain balance  Ambulation/Gait Ambulation/Gait assistance: Min guard Ambulation Distance (Feet): 15 Feet Assistive device: Rolling walker (2 wheeled)       General Gait Details: Pt fatigued with minimal ambulation on 4 liters  O2 and using AD.  She did not have any LOBs, but is not near her baseline at all.   Stairs            Wheelchair Mobility    Modified Rankin (Stroke Patients Only)       Balance Overall balance assessment: Modified Independent (needed AD in standing, typically independent w/o AD)                                           Pertinent Vitals/Pain Pain Assessment:  (minimal vague soreness)    Home Living Family/patient expects to be discharged to:: Private residence Living Arrangements: Spouse/significant other Available Help at Discharge: Family;Friend(s)   Home Access: Stairs to enter Entrance Stairs-Rails:  (has bar to hold) Entrance Stairs-Number of Steps: 2 Home Layout: Two level;Able to live on main level with bedroom/bathroom Home Equipment: Walker - 2 wheels      Prior Function Level of Independence: Independent         Comments: Pt normally able to be active, drive, run errands, etc     Hand Dominance        Extremity/Trunk Assessment   Upper Extremity Assessment Upper Extremity Assessment: Generalized weakness;Overall [facility] for  tasks assessed    Lower Extremity Assessment Lower Extremity Assessment: Overall WFL for tasks assessed;Generalized weakness       Communication   Communication: No difficulties  Cognition Arousal/Alertness: Awake/alert Behavior During Therapy: WFL for tasks assessed/performed Overall Cognitive Status: Within Functional Limits for tasks assessed                                        General Comments      Exercises     Assessment/Plan    PT Assessment Patient needs continued PT services  PT Problem List Decreased activity tolerance;Decreased strength;Decreased balance;Decreased mobility;Decreased safety awareness;Decreased knowledge of use of DME;Cardiopulmonary status limiting activity       PT Treatment Interventions DME instruction;Gait training;Functional mobility  training;Stair training;Therapeutic activities;Therapeutic exercise;Balance training;Patient/family education    PT Goals (Current goals can be found in the Care Plan section)  Acute Rehab PT Goals Patient Stated Goal: get stronger PT Goal Formulation: With patient Time For Goal Achievement: 10/02/16 Potential to Achieve Goals: Fair    Frequency Min 2X/week   Barriers to discharge        Co-evaluation               AM-PAC PT "6 Clicks" Daily Activity  Outcome Measure Difficulty turning over in bed (including adjusting bedclothes, sheets and blankets)?: A Little Difficulty moving from lying on back to sitting on the side of the bed? : A Little Difficulty sitting down on and standing up from a chair with arms (e.g., wheelchair, bedside commode, etc,.)?: A Little Help needed moving to and from a bed to chair (including a wheelchair)?: A Little Help needed walking in hospital room?: A Lot Help needed climbing 3-5 steps with a railing? : A Lot 6 Click Score: 16    End of Session Equipment Utilized During Treatment: Gait belt;Oxygen (4 liters, sats 96% at rest, 92% with light bed acts, 90% amb) Activity Tolerance: Patient limited by fatigue;Patient tolerated treatment well Patient left: with call bell/phone within reach;in chair;with family/visitor present   PT Visit Diagnosis: Muscle weakness (generalized) (M62.81);Difficulty in walking, not elsewhere classified (R26.2)    Time: 2122-4825 PT Time Calculation (min) (ACUTE ONLY): 21 min   Charges:   PT Evaluation $PT Eval Low Complexity: 1 Procedure     PT G Codes:        Kreg Shropshire, DPT 09/18/2016, 11:56 AM

## 2016-09-18 NOTE — Care Management (Signed)
Patient is in agreement to accept home health services- SN PT and maybe an aide.  Agency preference is [employer] [facility]  Discussed the lack of a "chronic" diagnosis documentation and possibility of having to pay for home oxygen if it is needed.

## 2016-09-18 NOTE — Progress Notes (Signed)
[facility] at [place] NAME: Darlene Lawrence    MR#:  [id number]  DATE OF BIRTH:  02/09/1941  SUBJECTIVE:  Better cough and shortness of breath, on O2 [place] 4L. REVIEW OF SYSTEMS:   Review of Systems  Constitutional: Negative for chills, fever and weight loss.  HENT: Negative for ear discharge, ear pain and nosebleeds.   Eyes: Negative for blurred vision, pain and discharge.  Respiratory: Positive for cough and shortness of breath. Negative for sputum production, wheezing and stridor.   Cardiovascular: Negative for chest pain, palpitations, orthopnea and PND.  Gastrointestinal: Negative for abdominal pain, diarrhea, nausea and vomiting.  Genitourinary: Negative for frequency and urgency.  Musculoskeletal: Negative for back pain and joint pain.  Neurological: Positive for weakness. Negative for sensory change, speech change and focal weakness.  Psychiatric/Behavioral: Negative for depression and hallucinations. The patient is not nervous/anxious.    Tolerating Diet:some Tolerating PT: pending  DRUG ALLERGIES:   Allergies  Allergen Reactions  . Cefixime     REACTION: Rash  . Levaquin [Levofloxacin] Other (See Comments)    Shoulder Bursitis  . Penicillins     REACTION: Rash    VITALS:  Blood pressure 115/61, pulse 63, temperature 97.7 F (36.5 C), temperature source Oral, resp. rate 12, height 5\' 4"  (1.626 m), weight 221 lb 12.5 oz (100.6 kg), SpO2 95 %.  PHYSICAL EXAMINATION:   Physical Exam  GENERAL:  76 y.o.-year-old patient lying in the bed with no acute distress. Obese and drowsy. EYES: Pupils equal, round, reactive to light and accommodation. No scleral icterus. Extraocular muscles intact.  HEENT: Head atraumatic, normocephalic. Oropharynx and nasopharynx clear.  NECK:  Supple, no jugular venous distention. No thyroid enlargement, no tenderness.  LUNGS decreased breath sounds bilaterally, no wheezing, no rales, rhonchi.  No use of accessory muscles of respiration.  CARDIOVASCULAR: S1, S2 normal. No murmurs, rubs, or gallops.  ABDOMEN: Soft, nontender, nondistended. Bowel sounds present. No organomegaly or mass.  EXTREMITIES: No cyanosis, clubbing or edema b/l.    NEUROLOGIC: Cranial nerves II through XII are intact. No focal Motor or sensory deficits b/l.   PSYCHIATRIC:  patient is awake but drowsy. SKIN: No obvious rash, lesion, or ulcer.   LABORATORY PANEL:  CBC  Recent Labs Lab 09/17/16 0424  WBC 5.4  HGB 10.6*  HCT 31.1*  PLT 110*    Chemistries   Recent Labs Lab 09/15/16 0320  09/16/16 0250  09/18/16 0744  NA 130*  --  130*  < > 139  K 3.3*  < > 4.9  < > 4.0  CL 95*  --  96*  < > 102  CO2 26  --  25  < > 31  GLUCOSE 114*  --  116*  < > 93  BUN 38*  --  34*  < > 20  CREATININE 1.42*  --  1.10*  < > 0.70  CALCIUM 7.9*  --  7.8*  < > 8.2*  MG  --   < > 2.5*  --   --   AST 89*  --   --   --   --   ALT 68*  --   --   --   --   ALKPHOS 36*  --   --   --   --   BILITOT 0.8  --   --   --   --   < > = values in this interval not displayed. Cardiac Enzymes  Recent Labs Lab 09/15/16 0320  TROPONINI 3.28*   RADIOLOGY:  No results found. ASSESSMENT AND PLAN:   Darlene Lawrence  is a 76 y.o. female presents with cough starting on Monday. She has been more lethargic and she slept all day on Wednesday and Thursday. She had some nausea vomiting and diarrhea that occurred on Wednesday and Thursday. She's had some disorientation. She's not eating or drinking. No complaints of chest pain or shortness of breath. Her cough is nonproductive  1. Acute NSTEMI  - Troponin 46.85---47.03--31.61-12.13-3.28. Per Dr. Ubaldo Glassing, Coronary arteries were essentially normal during cardiac cath, disconintue heparin.  continue aspirin and metoprolol.   2.Acute Hypoxic respiraotry failure with LL Pneumonia with decreased air entry and acute systolic CHF LV EF: 14% -   45%. -Started DuoNeb nebulizer solution with  budesonide nebulizers.  -With multiple allergies   cont with IV doxycycline and aztreonam for pneumonia. -Lasix 20 mg daily  3. Acute kidney injury  -initially received IVF -Avoid nephrotoxins Improved.  * hyponatremia. Improved.  4. Essential hypertension switched Dyazide over to metoprolol  5. Hyperlipidemia unspecified on low-dose Crestor.   6. History of breast cancer status post radiation therapy and lumpectomy in the past.  7. Electrolyte abnormality with hypokalemia hypophosphatemia hypomagnesemia Improved.  Generalized weakness. PT evaluation: HHPT. Case discussed with Care Management/Social Worker. Management plans discussed with the patient, family and they are in agreement.  CODE STATUS: Full  DVT Prophylaxis: on lovenox SQ TOTAL TIME TAKING CARE OF THIS PATIENT: 36 minutes.  >50% time spent on counselling and coordination of care Dr Ubaldo Glassing, pt's husband  POSSIBLE D/C IN 2 DAYS, DEPENDING ON CLINICAL CONDITION.  Note: This dictation was prepared with Dragon dictation along with smaller phrase technology. Any transcriptional errors that result from this process are unintentional.  Demetrios Loll M.D on 09/18/2016 at 3:44 PM  Between 7am to 6pm - Pager - [phone]  After 6pm go to [email or web address] - password [facility]  Office  [phone]  CC: Primary care physician; Jinny Sanders, MD

## 2016-09-19 ENCOUNTER — Telehealth: Payer: Self-pay

## 2016-09-19 LAB — CREATININE, SERUM
Creatinine, Ser: 0.78 mg/dL (ref 0.44–1.00)
GFR calc Af Amer: >60 mL/min (ref >60)
GFR calc non Af Amer: >60 mL/min (ref >60)

## 2016-09-19 MED ORDER — IPRATROPIUM-ALBUTEROL 0.5-2.5 (3) MG/3ML IN SOLN: 3.0000 mL | Freq: Four times a day (QID) | RESPIRATORY_TRACT | Status: DC | PRN

## 2016-09-19 MED ORDER — GUAIFENESIN 100 MG/5ML PO SOLN
5.0000 mL | ORAL | Status: DC | PRN
  Administered 2016-09-22: 100 mg via ORAL
  Filled 2016-09-19 (×2): qty 5

## 2016-09-19 NOTE — Care Management Important Message (Signed)
Important Message  Patient Details  Name: Darlene Lawrence MRN: [id number] Date of Birth: 20-Dec-1940   Medicare Important Message Given:  Yes Signed IM notice given    Katrina Stack, RN 09/19/2016, 8:25 AM

## 2016-09-19 NOTE — Care Management (Signed)
Referral to [place] for [place].

## 2016-09-19 NOTE — Telephone Encounter (Signed)
Jessi.Kub nurse with [facility] left v/m; there will be a delay for starting [facility] services; Physical Therapist can see pt on 09/25/16. Keisa request cb to verify this is OK by Dr Diona Browner.

## 2016-09-19 NOTE — Progress Notes (Signed)
Physical Therapy Treatment Patient Details Name: Darlene Lawrence MRN: [id number] DOB: 1941-03-05 Today's Date: 09/19/2016    History of Present Illness 76 y/o female here with 2-3 weeks of weakness, nausea, vomiting.  She was found to have had a heart attack and pneumonia.  She was in the CCU for a few days and is now on the floor.  Pt with history of hip and knee replacements.     PT Comments    Pt showed good effort with PT session but was generally sleepy and tired t/o the time.  Pt was able to ambulate ~110 ft with walker and 2 liters of O2 (sats stayed steady 91-93% and HR near 80 the entire time).  She is still not at all near her baseline with confident, community appropriate speed and no AD, but she did display ability to do in-home distances and general mobility w/o need of direct assist with AD and O2.  Pt's husband had many questions/concerns regarding expected stay, possible discharge options and generally showed some concern given her not being near baseline, prolonged weakness at home prior to hospitalization, CCU stay and continued lethargy and need for O2.  Answered all his questions, wife present and listening, but did not have a lot of input apart from stating she wants to avoid rehab and go home.  Pt showed good improvement from yesterday with decreased O2 demand and ability to do a more prolonged walk.  Overall pt is improving, though  likely not medically ready to go home she is making gains to this end.  HHPT still seems to be the appropriate discharge disposition assuming projected/expected recovery here at the hospital.  Follow Up Recommendations  Home health PT     Equipment Recommendations   (pt has walker at home)    Recommendations for Other Services       Precautions / Restrictions Precautions Precautions: Fall Restrictions Weight Bearing Restrictions: No    Mobility  Bed Mobility Overal bed mobility: Modified Independent             General bed  mobility comments: Pt able to get herself in/out of bed and maintain sitting balance w/o direct assist.   Transfers Overall transfer level: Modified independent Equipment used: Rolling walker (2 wheeled)             General transfer comment: Pt needed heavy UE use to rise to standing, but did not need any direct assist or excessive cuing. While trying to get up w/o AD she did need to use some momentum (w/ 1-2-3 type rocking)  Ambulation/Gait Ambulation/Gait assistance: Min guard Ambulation Distance (Feet): 110 Feet Assistive device: Rolling walker (2 wheeled)       General Gait Details: Pt on 2 liters of O2 this session and though she was globally tired she did not have excessive fatigue with the effort.  Her O2 stayed 91-93% and HR was 78-84 bpm t/o the effort (checked every ~30 ft)   Stairs            Wheelchair Mobility    Modified Rankin (Stroke Patients Only)       Balance Overall balance assessment: Modified Independent (again pt still needed walker to maintain balance)                                          Cognition Arousal/Alertness: Lethargic Behavior During Therapy: [facility] for tasks  assessed/performed Overall Cognitive Status: Within Functional Limits for tasks assessed                                        Exercises General Exercises - Lower Extremity Long Arc Quad: Strengthening;10 reps;Both Heel Slides: Strengthening;10 reps;Both Hip Flexion/Marching: Standing;10 reps;Both;AROM (faded hand held assist, pt with )    General Comments        Pertinent Vitals/Pain Pain Assessment: No/denies pain    Home Living                      Prior Function            PT Goals (current goals can now be found in the care plan section) Progress towards PT goals: Progressing toward goals    Frequency    Min 2X/week      PT Plan Current plan remains appropriate    Co-evaluation               AM-PAC PT "6 Clicks" Daily Activity  Outcome Measure  Difficulty turning over in bed (including adjusting bedclothes, sheets and blankets)?: A Little Difficulty moving from lying on back to sitting on the side of the bed? : A Little Difficulty sitting down on and standing up from a chair with arms (e.g., wheelchair, bedside commode, etc,.)?: A Little Help needed moving to and from a bed to chair (including a wheelchair)?: A Little Help needed walking in hospital room?: A Little Help needed climbing 3-5 steps with a railing? : A Little 6 Click Score: 18    End of Session Equipment Utilized During Treatment: Gait belt;Oxygen (88-90% on RA at rest on arrival, 2 liters t/o amb and ex) Activity Tolerance: Patient limited by fatigue;Patient tolerated treatment well Patient left: with nursing/sitter in room;with bed alarm set;with call bell/phone within reach;with family/visitor present   PT Visit Diagnosis: Muscle weakness (generalized) (M62.81);Difficulty in walking, not elsewhere classified (R26.2)     Time: 1540-0867 PT Time Calculation (min) (ACUTE ONLY): 43 min  Charges:  $Gait Training: 8-22 mins $Therapeutic Exercise: 8-22 mins $Therapeutic Activity: 8-22 mins                    G Codes:       Kreg Shropshire, DPT 09/19/2016, 5:35 PM

## 2016-09-19 NOTE — Care Management (Addendum)
Reviewed discharge plan with patient and husband and home health services. Patient's husband verbalizes concerns about patient going home with home health.  Feels she should go to skilled nursing facility.   Says he has concerns about patient going home if he should have to physically support her in any way.  He has balance issues.  Patient verbalizes in his presence that there is no reason for him to have to do anything.  Discussed medicare criteria for skilled nursing and physical therapy recommendations. Discussed that cm could provide resources for in home care.    At present she is room air- not requiring supplemental 02.Marland Kitchen Spoke with physical therapy and requested reassessment prior to discharge.  Anticipate discharge within next 24-48 hours. Will reassess today.  Husband says that he is not available for any discussion tomorrow between  9 - 12:30.  Has to take his special needs son to a job interview.  UPdated CSW.

## 2016-09-19 NOTE — Progress Notes (Signed)
REsp status much improved Pneumonia imrpofing, down to 2l [place] Assess for oxygen upon discharge when stable  No further Pulmonary Interventions at this time.    Please call us if needed @ [phone], will sign off at this time.

## 2016-09-19 NOTE — Clinical Social Work Note (Signed)
CSW was informed that patient may need SNF, however, PT reevaluated and are still recommending home health.  Case discussed with case manager and plan is to discharge home with home health.  CSW to sign off please re-consult if social work needs arise.  Jones Broom. [place], MSW, [place]

## 2016-09-19 NOTE — Progress Notes (Signed)
[place] at [place] NAME: Darlene Lawrence    MR#:  [id number]  DATE OF BIRTH:  1941/01/11  SUBJECTIVE:  Better cough and shortness of breath, on O2 [place] 2 L this am. Sleepy. REVIEW OF SYSTEMS:   Review of Systems  Constitutional: Negative for chills, fever and weight loss.  HENT: Negative for ear discharge, ear pain and nosebleeds.   Eyes: Negative for blurred vision, pain and discharge.  Respiratory: Positive for cough and shortness of breath. Negative for sputum production, wheezing and stridor.   Cardiovascular: Negative for chest pain, palpitations, orthopnea and PND.  Gastrointestinal: Negative for abdominal pain, diarrhea, nausea and vomiting.  Genitourinary: Negative for frequency and urgency.  Musculoskeletal: Negative for back pain and joint pain.  Neurological: Positive for weakness. Negative for sensory change, speech change and focal weakness.  Psychiatric/Behavioral: Negative for depression and hallucinations. The patient is not nervous/anxious.    Tolerating Diet:some Tolerating PT: pending  DRUG ALLERGIES:   Allergies  Allergen Reactions  . Cefixime     REACTION: Rash  . Levaquin [Levofloxacin] Other (See Comments)    Shoulder Bursitis  . Penicillins     REACTION: Rash    VITALS:  Blood pressure (!) 123/59, pulse 69, temperature 98.4 F (36.9 C), temperature source Oral, resp. rate 18, height 5\' 4"  (1.626 m), weight 221 lb 12.5 oz (100.6 kg), SpO2 92 %.  PHYSICAL EXAMINATION:   Physical Exam  GENERAL:  76 y.o.-year-old patient lying in the bed with no acute distress. Obese and drowsy. EYES: Pupils equal, round, reactive to light and accommodation. No scleral icterus. Extraocular muscles intact.  HEENT: Head atraumatic, normocephalic. Oropharynx and nasopharynx clear.  NECK:  Supple, no jugular venous distention. No thyroid enlargement, no tenderness.  LUNGS decreased breath sounds bilaterally, no wheezing,  some rales. No use of accessory muscles of respiration.  CARDIOVASCULAR: S1, S2 normal. No murmurs, rubs, or gallops.  ABDOMEN: Soft, nontender, nondistended. Bowel sounds present. No organomegaly or mass.  EXTREMITIES: No cyanosis, clubbing or edema b/l.    NEUROLOGIC: Cranial nerves II through XII are intact. No focal Motor or sensory deficits b/l.   PSYCHIATRIC:  patient is awake but drowsy. SKIN: No obvious rash, lesion, or ulcer.   LABORATORY PANEL:  CBC  Recent Labs Lab 09/17/16 0424  WBC 5.4  HGB 10.6*  HCT 31.1*  PLT 110*    Chemistries   Recent Labs Lab 09/15/16 0320  09/16/16 0250  09/18/16 0744 09/19/16 0520  NA 130*  --  130*  < > 139  --   K 3.3*  < > 4.9  < > 4.0  --   CL 95*  --  96*  < > 102  --   CO2 26  --  25  < > 31  --   GLUCOSE 114*  --  116*  < > 93  --   BUN 38*  --  34*  < > 20  --   CREATININE 1.42*  --  1.10*  < > 0.70 0.78  CALCIUM 7.9*  --  7.8*  < > 8.2*  --   MG  --   < > 2.5*  --   --   --   AST 89*  --   --   --   --   --   ALT 68*  --   --   --   --   --   ALKPHOS 36*  --   --   --   --   --  BILITOT 0.8  --   --   --   --   --   < > = values in this interval not displayed. Cardiac Enzymes  Recent Labs Lab 09/15/16 0320  TROPONINI 3.28*   RADIOLOGY:  No results found. ASSESSMENT AND PLAN:   Darlene Lawrence  is a 76 y.o. female presents with cough starting on Monday. She has been more lethargic and she slept all day on Wednesday and Thursday. She had some nausea vomiting and diarrhea that occurred on Wednesday and Thursday. She's had some disorientation. She's not eating or drinking. No complaints of chest pain or shortness of breath. Her cough is nonproductive  1. Acute NSTEMI  - Troponin 46.85---47.03--31.61-12.13-3.28. Per Dr. Ubaldo Glassing, Coronary arteries were essentially normal during cardiac cath, disconintue heparin.  continue aspirin and metoprolol.   2.Acute Hypoxic respiraotry failure with LL Pneumonia with decreased air  entry and acute systolic CHF LV EF: 44% -   45%. -Started DuoNeb nebulizer solution with budesonide nebulizers.  -With multiple allergies   cont doxycycline and aztreonam for pneumonia. -Lasix 20 mg daily  3. Acute kidney injury  -initially received IVF -Avoid nephrotoxins Improved.  * hyponatremia. Improved.  4. Essential hypertension switched Dyazide over to metoprolol  5. Hyperlipidemia unspecified on low-dose Crestor.   6. History of breast cancer status post radiation therapy and lumpectomy in the past.  7. Electrolyte abnormality with hypokalemia hypophosphatemia hypomagnesemia Improved.  Generalized weakness. PT evaluation: HHPT. Case discussed with Care Management/Social Worker. Management plans discussed with the patient, her husband and they are in agreement.  CODE STATUS: Full  DVT Prophylaxis: on lovenox SQ TOTAL TIME TAKING CARE OF THIS PATIENT: 36 minutes.  >50% time spent on counselling and coordination of care Dr Ubaldo Glassing, pt's husband  POSSIBLE D/C IN 1-2 DAYS, DEPENDING ON CLINICAL CONDITION.  Note: This dictation was prepared with Dragon dictation along with smaller phrase technology. Any transcriptional errors that result from this process are unintentional.  Demetrios Loll M.D on 09/19/2016 at 2:32 PM  Between 7am to 6pm - Pager - [phone]  After 6pm go to [email or web address] - password [email or web address]  Office  [phone]  CC: Primary care physician; Jinny Sanders, MD

## 2016-09-20 NOTE — Progress Notes (Signed)
[place] at [place] NAME: Darlene Lawrence    MR#:  [id number]  DATE OF BIRTH:  [birth year]  SUBJECTIVE:  Better cough and shortness of breath, on O2 [place] 2 L this am.  REVIEW OF SYSTEMS:   Review of Systems  Constitutional: Negative for chills, fever and weight loss.  HENT: Negative for ear discharge, ear pain and nosebleeds.   Eyes: Negative for blurred vision, pain and discharge.  Respiratory: Positive for cough and shortness of breath. Negative for sputum production, wheezing and stridor.   Cardiovascular: Negative for chest pain, palpitations, orthopnea and PND.  Gastrointestinal: Negative for abdominal pain, diarrhea, nausea and vomiting.  Genitourinary: Negative for frequency and urgency.  Musculoskeletal: Negative for back pain and joint pain.  Neurological: Positive for weakness. Negative for sensory change, speech change and focal weakness.  Psychiatric/Behavioral: Negative for depression and hallucinations. The patient is not nervous/anxious.    Tolerating Diet:some Tolerating PT: pending  DRUG ALLERGIES:   Allergies  Allergen Reactions  . Cefixime     REACTION: Rash  . Levaquin [Levofloxacin] Other (See Comments)    Shoulder Bursitis  . Penicillins     REACTION: Rash    VITALS:  Blood pressure 136/68, pulse 65, temperature 98.3 F (36.8 C), temperature source Oral, resp. rate 16, height 5\' 4"  (1.626 m), weight 221 lb 12.5 oz (100.6 kg), SpO2 95 %.  PHYSICAL EXAMINATION:   Physical Exam  GENERAL:  76 y.o.-year-old patient lying in the bed with no acute distress. Obese and drowsy. EYES: Pupils equal, round, reactive to light and accommodation. No scleral icterus. Extraocular muscles intact.  HEENT: Head atraumatic, normocephalic. Oropharynx and nasopharynx clear.  NECK:  Supple, no jugular venous distention. No thyroid enlargement, no tenderness.  LUNGS decreased breath sounds bilaterally, no wheezing, some  basilar rales. No use of accessory muscles of respiration.  CARDIOVASCULAR: S1, S2 normal. No murmurs, rubs, or gallops.  ABDOMEN: Soft, nontender, nondistended. Bowel sounds present. No organomegaly or mass.  EXTREMITIES: No cyanosis, clubbing or edema b/l.    NEUROLOGIC: Cranial nerves II through XII are intact. No focal Motor or sensory deficits b/l.   PSYCHIATRIC:  patient is awake but drowsy. SKIN: No obvious rash, lesion, or ulcer.   LABORATORY PANEL:  CBC  Recent Labs Lab 09/17/16 0424  WBC 5.4  HGB 10.6*  HCT 31.1*  PLT 110*    Chemistries   Recent Labs Lab 09/15/16 0320  09/16/16 0250  09/18/16 0744 09/19/16 0520  NA 130*  --  130*  < > 139  --   K 3.3*  < > 4.9  < > 4.0  --   CL 95*  --  96*  < > 102  --   CO2 26  --  25  < > 31  --   GLUCOSE 114*  --  116*  < > 93  --   BUN 38*  --  34*  < > 20  --   CREATININE 1.42*  --  1.10*  < > 0.70 0.78  CALCIUM 7.9*  --  7.8*  < > 8.2*  --   MG  --   < > 2.5*  --   --   --   AST 89*  --   --   --   --   --   ALT 68*  --   --   --   --   --   ALKPHOS 36*  --   --   --   --   --  BILITOT 0.8  --   --   --   --   --   < > = values in this interval not displayed. Cardiac Enzymes  Recent Labs Lab 09/15/16 0320  TROPONINI 3.28*   RADIOLOGY:  No results found. ASSESSMENT AND PLAN:   Darlene Lawrence  is a 76 y.o. female presents with cough starting on Monday. She has been more lethargic and she slept all day on Wednesday and Thursday. She had some nausea vomiting and diarrhea that occurred on Wednesday and Thursday. She's had some disorientation. She's not eating or drinking. No complaints of chest pain or shortness of breath. Her cough is nonproductive  1. Acute NSTEMI  - Troponin 46.85---47.03--31.61-12.13-3.28. Per Dr. Ubaldo Glassing, Coronary arteries were essentially normal during cardiac cath, disconintue heparin.  continue aspirin and metoprolol.   2.Acute Hypoxic respiraotry failure with LL Pneumonia with decreased  air entry and acute systolic CHF LV EF: 11% -   45%. Continue DuoNeb nebulizer solution with budesonide nebulizers. Robitussin when necessary. -With multiple allergies   cont doxycycline and aztreonam for pneumonia. -Lasix 20 mg daily  3. Acute kidney injury  -initially received IVF -Avoid nephrotoxins Improved.  * hyponatremia. Improved.  4. Essential hypertension switched Dyazide over to metoprolol  5. Hyperlipidemia unspecified on low-dose Crestor.   6. History of breast cancer status post radiation therapy and lumpectomy in the past.  7. Electrolyte abnormality with hypokalemia hypophosphatemia hypomagnesemia Improved.  Generalized weakness. PT evaluation: HHPT. Case discussed with Care Management/Social Worker. Management plans discussed with the patient, her husband and they are in agreement.  CODE STATUS: Full  DVT Prophylaxis: on lovenox SQ TOTAL TIME TAKING CARE OF THIS PATIENT: 33 minutes.  >50% time spent on counselling and coordination of care Dr Ubaldo Glassing, pt's husband  POSSIBLE D/C IN 1-2 DAYS, DEPENDING ON CLINICAL CONDITION.  Note: This dictation was prepared with Dragon dictation along with smaller phrase technology. Any transcriptional errors that result from this process are unintentional.  Demetrios Loll M.D on 09/20/2016 at 11:41 AM  Between 7am to 6pm - Pager - [phone]  After 6pm go to [email or web address] - password [email or web address]  Office  [phone]  CC: Primary care physician; Jinny Sanders, MD

## 2016-09-20 NOTE — Progress Notes (Signed)
SATURATION QUALIFICATIONS: (This note is used to comply with regulatory documentation for home oxygen)  Patient Saturations on Room Air at Rest = 93%  Patient Saturations on Room Air while Ambulating = 87%  Patient Saturations on 2 Liters of oxygen while Ambulating = 90%  Please briefly explain why patient needs home oxygen: 

## 2016-09-21 LAB — CULTURE, BLOOD (ROUTINE X 2)
CULTURE: NO GROWTH
Culture: NO GROWTH
SPECIAL REQUESTS: ADEQUATE
SPECIAL REQUESTS: ADEQUATE

## 2016-09-21 NOTE — Progress Notes (Signed)
[place] at [place] NAME: Darlene Lawrence    MR#:  [id number]  DATE OF BIRTH:  01-31-1941  SUBJECTIVE:  still cough and shortness of breath, on O2 [place] 2 L. Weakness. REVIEW OF SYSTEMS:   Review of Systems  Constitutional: Negative for chills, fever and weight loss.  HENT: Negative for ear discharge, ear pain and nosebleeds.   Eyes: Negative for blurred vision, pain and discharge.  Respiratory: Positive for cough and shortness of breath. Negative for sputum production, wheezing and stridor.   Cardiovascular: Negative for chest pain, palpitations, orthopnea and PND.  Gastrointestinal: Negative for abdominal pain, diarrhea, nausea and vomiting.  Genitourinary: Negative for frequency and urgency.  Musculoskeletal: Negative for back pain and joint pain.  Neurological: Positive for weakness. Negative for sensory change, speech change and focal weakness.  Psychiatric/Behavioral: Negative for depression and hallucinations. The patient is not nervous/anxious.    Tolerating Diet:some Tolerating PT: pending  DRUG ALLERGIES:   Allergies  Allergen Reactions  . Cefixime     REACTION: Rash  . Levaquin [Levofloxacin] Other (See Comments)    Shoulder Bursitis  . Penicillins     REACTION: Rash    VITALS:  Blood pressure 117/78, pulse (!) 57, temperature 98.5 F (36.9 C), temperature source Oral, resp. rate 16, height 5\' 4"  (1.626 m), weight 221 lb 12.5 oz (100.6 kg), SpO2 94 %.  PHYSICAL EXAMINATION:   Physical Exam  GENERAL:  76 y.o.-year-old patient lying in the bed with no acute distress. Obese and drowsy. EYES: Pupils equal, round, reactive to light and accommodation. No scleral icterus. Extraocular muscles intact.  HEENT: Head atraumatic, normocephalic. Oropharynx and nasopharynx clear.  NECK:  Supple, no jugular venous distention. No thyroid enlargement, no tenderness.  LUNGS decreased breath sounds bilaterally, no wheezing, some  basilar rales. No use of accessory muscles of respiration.  CARDIOVASCULAR: S1, S2 normal. No murmurs, rubs, or gallops.  ABDOMEN: Soft, nontender, nondistended. Bowel sounds present. No organomegaly or mass.  EXTREMITIES: No cyanosis, clubbing or edema b/l.    NEUROLOGIC: Cranial nerves II through XII are intact. No focal Motor or sensory deficits b/l.   PSYCHIATRIC:  patient is awake but drowsy. SKIN: No obvious rash, lesion, or ulcer.   LABORATORY PANEL:  CBC  Recent Labs Lab 09/17/16 0424  WBC 5.4  HGB 10.6*  HCT 31.1*  PLT 110*    Chemistries   Recent Labs Lab 09/15/16 0320  09/16/16 0250  09/18/16 0744 09/19/16 0520  NA 130*  --  130*  < > 139  --   K 3.3*  < > 4.9  < > 4.0  --   CL 95*  --  96*  < > 102  --   CO2 26  --  25  < > 31  --   GLUCOSE 114*  --  116*  < > 93  --   BUN 38*  --  34*  < > 20  --   CREATININE 1.42*  --  1.10*  < > 0.70 0.78  CALCIUM 7.9*  --  7.8*  < > 8.2*  --   MG  --   < > 2.5*  --   --   --   AST 89*  --   --   --   --   --   ALT 68*  --   --   --   --   --   ALKPHOS 36*  --   --   --   --   --  BILITOT 0.8  --   --   --   --   --   < > = values in this interval not displayed. Cardiac Enzymes  Recent Labs Lab 09/15/16 0320  TROPONINI 3.28*   RADIOLOGY:  No results found. ASSESSMENT AND PLAN:   Darlene Lawrence  is a 76 y.o. female presents with cough starting on Monday. She has been more lethargic and she slept all day on Wednesday and Thursday. She had some nausea vomiting and diarrhea that occurred on Wednesday and Thursday. She's had some disorientation. She's not eating or drinking. No complaints of chest pain or shortness of breath. Her cough is nonproductive  1. Acute NSTEMI  - Troponin 46.85---47.03--31.61-12.13-3.28. Per Dr. Ubaldo Glassing, Coronary arteries were essentially normal during cardiac cath, disconintue heparin.  continue aspirin and metoprolol.   2.Acute Hypoxic respiraotry failure with LL Pneumonia with decreased  air entry and acute systolic CHF LV EF: 03% -   45%. Continue DuoNeb nebulizer solution with budesonide nebulizers. Robitussin when necessary. -With multiple allergies   cont doxycycline and aztreonam for pneumonia. -Lasix 20 mg daily Patient Saturations on Room Air while Ambulating = 87% She needs home O2 [place] 2 L.  3. Acute kidney injury  -initially received IVF -Avoid nephrotoxins Improved.  * hyponatremia. Improved.  4. Essential hypertension switched Dyazide over to metoprolol  5. Hyperlipidemia unspecified on low-dose Crestor.   6. History of breast cancer status post radiation therapy and lumpectomy in the past.  7. Electrolyte abnormality with hypokalemia hypophosphatemia hypomagnesemia Improved.  Generalized weakness. PT evaluation: HHPT. Case discussed with Care Management/Social Worker. Management plans discussed with the patient, her husband and they are in agreement.  CODE STATUS: Full  DVT Prophylaxis: on lovenox SQ TOTAL TIME TAKING CARE OF THIS PATIENT: 33 minutes.  >50% time spent on counselling and coordination of care Dr Ubaldo Glassing, pt's husband  POSSIBLE D/C IN 1-2 DAYS, DEPENDING ON CLINICAL CONDITION.  Note: This dictation was prepared with Dragon dictation along with smaller phrase technology. Any transcriptional errors that result from this process are unintentional.  Demetrios Loll M.D on 09/21/2016 at 12:45 PM  Between 7am to 6pm - Pager - [phone]  After 6pm go to [email or web address] - password [email or web address]  Office  [phone]  CC: Primary care physician; Jinny Sanders, MD

## 2016-09-22 MED ORDER — ENSURE ENLIVE PO LIQD: 237.0000 mL | ORAL | 12 refills | Status: DC

## 2016-09-22 MED ORDER — FUROSEMIDE 20 MG PO TABS: 20.0000 mg | ORAL_TABLET | Freq: Every day | ORAL | 0 refills | Status: DC

## 2016-09-22 MED ORDER — METOPROLOL TARTRATE 25 MG PO TABS
25.0000 mg | ORAL_TABLET | Freq: Two times a day (BID) | ORAL | 0 refills | Status: DC
Start: 2016-09-22 — End: 2017-11-06

## 2016-09-22 NOTE — NC FL2 (Signed)
[place] LEVEL OF CARE SCREENING TOOL     IDENTIFICATION  Patient Name: Darlene Lawrence Birthdate: 1940-08-13 Sex: female Admission Date (Current Location): 09/12/2016  [place] and [place] Number:  Engineering geologist and Address:  [facility], [address], [place], [place] [address]      Provider Number: [id number]  Attending Physician Name and Address:  Bettey Costa, MD  Relative Name and Phone Number:       Current Level of Care: Hospital Recommended Level of Care: [place] Prior Approval Number:    Date Approved/Denied:   PASRR Number:    Discharge Plan: SNF    Current Diagnoses: Patient Active Problem List   Diagnosis Date Noted  . Community acquired pneumonia 09/12/2016  . Dehydration 09/12/2016  . NSTEMI (non-ST elevated myocardial infarction) ([place]) 09/12/2016  . CKD (chronic kidney disease) stage 3, GFR 30-59 ml/min 10/16/2015  . Bilateral hearing loss 10/16/2015  . Arthritis 11/10/2014  . History of breast cancer 11/10/2014  . Cystocele 10/11/2014  . Counseling regarding end of life decision making 07/04/2014  . Postmenopausal 01/13/2014  . OA (osteoarthritis) of knee 06/06/2013  . Thrombocytopenia, unspecified ([place]) 11/11/2012  . Cystocele or rectocele with uterine prolapse 06/09/2012  . Uterine prolapse 09/11/2010  . Malignant neoplasm of right female breast ([place]) 06/28/2007  . Hyperlipidemia 06/28/2007  . Essential hypertension 06/28/2007  . BUNDLE BRANCH BLOCK, RIGHT 06/28/2007    Orientation RESPIRATION BLADDER Height & Weight     Self, Time, Situation, Place  Normal Continent Weight: 213 lb 11.2 oz (96.9 kg) Height:  5\' 4"  (162.6 cm)  BEHAVIORAL SYMPTOMS/MOOD NEUROLOGICAL BOWEL NUTRITION STATUS   (none)  (none) Continent Diet (cardiac diet)  AMBULATORY STATUS COMMUNICATION OF NEEDS Skin   Supervision (patient ambulated 220 feet with PT and PT recommended Home Health) Verbally Normal                       Personal Care Assistance Level of Assistance  Bathing, Dressing Bathing Assistance: Limited assistance   Dressing Assistance: Limited assistance     Functional Limitations Info   (none)          SPECIAL CARE FACTORS FREQUENCY  PT (By licensed PT)                    Contractures Contractures Info: Not present    Additional Factors Info  Code Status, Allergies Code Status Info: full Allergies Info: cefixime, levaquin, pcns           Current Medications (09/22/2016):  This is the current hospital active medication list Current Facility-Administered Medications  Medication Dose Route Frequency Provider Last Rate Last Dose  . 0.9 %  sodium chloride infusion  250 mL Intravenous PRN Teodoro Spray, MD      . 0.9 %  sodium chloride infusion  250 mL Intravenous PRN Teodoro Spray, MD      . acetaminophen (TYLENOL) tablet 650 mg  650 mg Oral Q6H PRN Loletha Grayer, MD   650 mg at 09/13/16 1135  . aspirin tablet 325 mg  325 mg Oral Daily Loletha Grayer, MD   325 mg at 09/22/16 0931  . chlorhexidine (PERIDEX) 0.12 % solution 15 mL  15 mL Mouth Rinse BID Fritzi Mandes, MD   15 mL at 09/22/16 0931  . cholecalciferol (VITAMIN D) tablet 2,000 Units  2,000 Units Oral BID Loletha Grayer, MD   2,000 Units at 09/22/16 0931  .  enoxaparin (LOVENOX) injection 40 mg  40 mg Subcutaneous Q24H Demetrios Loll, MD   40 mg at 09/21/16 2040  . feeding supplement (ENSURE ENLIVE) (ENSURE ENLIVE) liquid 237 mL  237 mL Oral 2 times per day Napoleon Form, RPH   237 mL at 09/22/16 1257  . guaiFENesin (ROBITUSSIN) 100 MG/5ML solution 100 mg  5 mL Oral Q4H PRN Demetrios Loll, MD   100 mg at 09/22/16 0932  . ipratropium-albuterol (DUONEB) 0.5-2.5 (3) MG/3ML nebulizer solution 3 mL  3 mL Nebulization Q6H PRN Demetrios Loll, MD      . LORazepam (ATIVAN) injection 0.5-1 mg  0.5-1 mg Intravenous Q4H PRN Wilhelmina Mcardle, MD   1 mg at 09/19/16 0041  . MEDLINE mouth rinse  15 mL Mouth  Rinse q12n4p Fritzi Mandes, MD   15 mL at 09/20/16 1647  . metoprolol tartrate (LOPRESSOR) tablet 25 mg  25 mg Oral BID Loletha Grayer, MD   25 mg at 09/22/16 0931  . multivitamin with minerals tablet 1 tablet  1 tablet Oral q morning - 10a Loletha Grayer, MD   1 tablet at 09/22/16 0931  . nitroGLYCERIN (NITROSTAT) SL tablet 0.4 mg  0.4 mg Sublingual Q5 min PRN Wieting, Richard, MD      . nitroGLYCERIN 50 mg in dextrose 5 % 250 mL (0.2 mg/mL) infusion  0-200 mcg/min Intravenous Titrated Anders Simmonds, MD   Stopped at 09/14/16 [phone]  . omega-3 acid ethyl esters (LOVAZA) capsule 1 g  1 g Oral BID Loletha Grayer, MD   1 g at 09/22/16 0931  . ondansetron (ZOFRAN) injection 4 mg  4 mg Intravenous Q6H PRN Fritzi Mandes, MD   4 mg at 09/21/16 0747  . promethazine (PHENERGAN) injection 12.5 mg  12.5 mg Intravenous Q8H PRN Fritzi Mandes, MD   12.5 mg at 09/13/16 2004  . rosuvastatin (CRESTOR) tablet 10 mg  10 mg Oral Once per day on Mon Thu Wieting, Richard, MD   10 mg at 09/22/16 0931  . sodium chloride flush (NS) 0.9 % injection 3 mL  3 mL Intravenous Q12H Teodoro Spray, MD   3 mL at 09/22/16 1000  . sodium chloride flush (NS) 0.9 % injection 3 mL  3 mL Intravenous PRN Teodoro Spray, MD   3 mL at 09/19/16 1810  . sodium chloride flush (NS) 0.9 % injection 3 mL  3 mL Intravenous Q12H Teodoro Spray, MD   3 mL at 09/22/16 1257  . sodium chloride flush (NS) 0.9 % injection 3 mL  3 mL Intravenous PRN Teodoro Spray, MD   3 mL at 09/21/16 0747     Discharge Medications: Please see discharge summary for a list of discharge medications.  Relevant Imaging Results:  Relevant Lab Results:   Additional Information ss: [id number]  Shela Leff, LCSW

## 2016-09-22 NOTE — Care Management (Signed)
Notified Dr Benjie Karvonen of physical therapy recommendations and CM and attending will speak with patient and husband together

## 2016-09-22 NOTE — Discharge Summary (Addendum)
[facility] [place] NAME: Darlene Lawrence    MR#:  [id number]  DATE OF BIRTH:  1941/01/05  DATE OF ADMISSION:  09/12/2016 ADMITTING PHYSICIAN: Loletha Grayer, MD  DATE OF DISCHARGE: 09/22/2016  PRIMARY CARE PHYSICIAN: Jinny Sanders, MD    ADMISSION DIAGNOSIS:  Elevated troponin [R74.8] Community acquired pneumonia of left lower lobe of lung ([place]) [J18.1]  DISCHARGE DIAGNOSIS:  Principal Problem:   Community acquired pneumonia Active Problems: NSTEMI CHF, EF 40-45%  SECONDARY DIAGNOSIS:   Past Medical History:  Diagnosis Date  . Abnormal Pap smear   . Arthritis   . breast cancer 2009   right  . Cancer ([place])   . DDD (degenerative disc disease), lumbar   . High cholesterol   . Hyperlipidemia   . Hypertension   . Meniscus tear    right  . Personal history of radiation therapy   . RBBB     HOSPITAL COURSE:   76 year old female with a history of hypertension who presented with shortness of breath and found to have elevated troponins.   1. Acute hypoxic respiratory failure in the setting of community acquired pneumonia and CHF Patient was on supplemental oxygen Due to multiple allergy she was on aztreonam and doxycycline. She has completed treatment for pneumonia and no longer needs antibiotics. She is Euvolemic at this time, however still is requiring O2 at discharge. She will require Lasix at discharge with follow up with Cardiology and CHF clinic.   2. NSTEMI: Troponins were initially greater than 40. Patient underwent cardiac catheterization and interestingly cardiac catheterization showed normal coronary arteries.  She underwent echocardiogram which showed  Hypokinesis of the basal anteroseptal and basal anterolateral myocardium however ejection fraction of 40-45%. She will be discharged on aspirin, statin and metoprolol.  She will be referred to cardiac rehabilitation  3. Essential hypertension: Patient will be  discharged on Metoprolol. Dyazide has been discontinued  4.AKI: This is resolved with holding outpatient diuretic.  5. Hyponatremia: This is resolved with holding outpatient diuretic. 6. Electrolyte abnormalities: These were all repleted.  DISCHARGE CONDITIONS AND DIET:   Stable  Cardiac diet is recommended CONSULTS OBTAINED:  Treatment Team:  Teodoro Spray, MD  DRUG ALLERGIES:   Allergies  Allergen Reactions  . Cefixime     REACTION: Rash  . Levaquin [Levofloxacin] Other (See Comments)    Shoulder Bursitis  . Penicillins     REACTION: Rash    DISCHARGE MEDICATIONS:   Current Discharge Medication List    START taking these medications   Details  feeding supplement, ENSURE ENLIVE, (ENSURE ENLIVE) LIQD Take 237 mLs by mouth daily. Qty: 237 mL, Refills: 12    furosemide (LASIX) 20 MG tablet Take 1 tablet (20 mg total) by mouth daily. Qty: 30 tablet, Refills: 0    metoprolol tartrate (LOPRESSOR) 25 MG tablet Take 1 tablet (25 mg total) by mouth 2 (two) times daily. Qty: 60 tablet, Refills: 0      CONTINUE these medications which have NOT CHANGED   Details  Aspirin (ASPIR-81 PO) Take 1 tablet by mouth.    b complex vitamins tablet Take 1 tablet by mouth daily.    Calcium Citrate-Vitamin D (CALCIUM CITRATE + PO) Take 1 tablet by mouth daily.    Cholecalciferol (VITAMIN D3) 2000 UNITS TABS Take 1 capsule by mouth 2 (two) times daily.    Coenzyme Q10 (COQ10) 100 MG CAPS Take 1 capsule by mouth daily.    Misc Natural Products (  GLUCOSAMINE CHOND MSM FORMULA PO) Take 1,500 mg by mouth.    Multiple Vitamin (MULTI VITAMIN DAILY PO) Take 1 tablet by mouth.    Omega-3 Fatty Acids (FISH OIL) 1200 MG CAPS Take 1 capsule by mouth.    rosuvastatin (CRESTOR) 10 MG tablet Take 10 mg by mouth 2 (two) times a week. Monday, Thursday.      STOP taking these medications     triamterene-hydrochlorothiazide (MAXZIDE-25) 37.5-25 MG tablet           Today   CHIEF  COMPLAINT:   Patient wants to go to skilled nursing facility. Shortness of breath is improved. Patient denies chest pain. She has feels weak.   VITAL SIGNS:  Blood pressure (!) 100/57, pulse (!) 58, temperature 98.1 F (36.7 C), temperature source Oral, resp. rate 18, height 5\' 4"  (1.626 m), weight 96.9 kg (213 lb 11.2 oz), SpO2 94 %.   REVIEW OF SYSTEMS:  Review of Systems  Constitutional: Positive for malaise/fatigue. Negative for chills and fever.  HENT: Negative.  Negative for ear discharge, ear pain, hearing loss, nosebleeds and sore throat.   Eyes: Negative.  Negative for blurred vision and pain.  Respiratory: Negative.  Negative for cough, hemoptysis, shortness of breath and wheezing.   Cardiovascular: Negative.  Negative for chest pain, palpitations and leg swelling.  Gastrointestinal: Negative.  Negative for abdominal pain, blood in stool, diarrhea, nausea and vomiting.  Genitourinary: Negative.  Negative for dysuria.  Musculoskeletal: Negative.  Negative for back pain.  Skin: Negative.   Neurological: Positive for weakness. Negative for dizziness, tremors, speech change, focal weakness, seizures and headaches.  Endo/Heme/Allergies: Negative.  Does not bruise/bleed easily.  Psychiatric/Behavioral: Negative.  Negative for depression, hallucinations and suicidal ideas.     PHYSICAL EXAMINATION:  GENERAL:  76 y.o.-year-old patient lying in the bed with no acute distress.  NECK:  Supple, no jugular venous distention. No thyroid enlargement, no tenderness.  LUNGS: Normal breath sounds bilaterally, no wheezing, rales,rhonchi  No use of accessory muscles of respiration.  CARDIOVASCULAR: S1, S2 normal. No murmurs, rubs, or gallops.  ABDOMEN: Soft, non-tender, non-distended. Bowel sounds present. No organomegaly or mass.  EXTREMITIES: No pedal edema, cyanosis, or clubbing.  PSYCHIATRIC: The patient is alert and oriented x 3.  SKIN: No obvious rash, lesion, or ulcer.   DATA  REVIEW:   CBC  Recent Labs Lab 09/17/16 0424  WBC 5.4  HGB 10.6*  HCT 31.1*  PLT 110*    Chemistries   Recent Labs Lab 09/16/16 0250  09/18/16 0744 09/19/16 0520  NA 130*  < > 139  --   K 4.9  < > 4.0  --   CL 96*  < > 102  --   CO2 25  < > 31  --   GLUCOSE 116*  < > 93  --   BUN 34*  < > 20  --   CREATININE 1.10*  < > 0.70 0.78  CALCIUM 7.8*  < > 8.2*  --   MG 2.5*  --   --   --   < > = values in this interval not displayed.  Cardiac Enzymes No results for input(s): TROPONINI in the last 168 hours.  Microbiology Results  @MICRORSLT48 @  RADIOLOGY:  No results found.    Current Discharge Medication List    START taking these medications   Details  feeding supplement, ENSURE ENLIVE, (ENSURE ENLIVE) LIQD Take 237 mLs by mouth daily. Qty: 237 mL, Refills: 12    furosemide (  LASIX) 20 MG tablet Take 1 tablet (20 mg total) by mouth daily. Qty: 30 tablet, Refills: 0    metoprolol tartrate (LOPRESSOR) 25 MG tablet Take 1 tablet (25 mg total) by mouth 2 (two) times daily. Qty: 60 tablet, Refills: 0      CONTINUE these medications which have NOT CHANGED   Details  Aspirin (ASPIR-81 PO) Take 1 tablet by mouth.    b complex vitamins tablet Take 1 tablet by mouth daily.    Calcium Citrate-Vitamin D (CALCIUM CITRATE + PO) Take 1 tablet by mouth daily.    Cholecalciferol (VITAMIN D3) 2000 UNITS TABS Take 1 capsule by mouth 2 (two) times daily.    Coenzyme Q10 (COQ10) 100 MG CAPS Take 1 capsule by mouth daily.    Misc Natural Products (GLUCOSAMINE CHOND MSM FORMULA PO) Take 1,500 mg by mouth.    Multiple Vitamin (MULTI VITAMIN DAILY PO) Take 1 tablet by mouth.    Omega-3 Fatty Acids (FISH OIL) 1200 MG CAPS Take 1 capsule by mouth.    rosuvastatin (CRESTOR) 10 MG tablet Take 10 mg by mouth 2 (two) times a week. Monday, Thursday.      STOP taking these medications     triamterene-hydrochlorothiazide (MAXZIDE-25) 37.5-25 MG tablet             Management plans discussed with the patient and she is in agreement. Stable for discharge   Patient should follow up with pcp  CODE STATUS:     Code Status Orders        Start     Ordered   09/12/16 1131  Full code  Continuous     09/12/16 1131    Code Status History    Date Active Date Inactive Code Status Order ID Comments User Context   06/06/2013 10:50 AM 06/08/2013  4:55 PM Full Code [id number]  [name], MD Inpatient    Advance Directive Documentation     Most Recent Value  Type of Advance Directive  Healthcare Power of Attorney  Pre-existing out of facility DNR order (yellow form or pink MOST form)  -  "MOST" Form in Place?  -      TOTAL TIME TAKING CARE OF THIS PATIENT: 378 minutes.    Note: This dictation was prepared with Dragon dictation along with smaller phrase technology. Any transcriptional errors that result from this process are unintentional.  Deserae Jennings M.D on 09/22/2016 at 3:19 PM  Between 7am to 6pm - Pager - [phone] After 6pm go to [email or web address] - password [email or web address]  Office  [phone]  CC: Primary care physician; Jinny Sanders, MD

## 2016-09-22 NOTE — Telephone Encounter (Signed)
This is not ideal... Id there another vendor that would be able to see her sooner?

## 2016-09-22 NOTE — Care Management Important Message (Signed)
Important Message  Patient Details  Name: Darlene Lawrence MRN: [id number] Date of Birth: 1940-07-20   Medicare Important Message Given:  Yes    Katrina Stack, RN 09/22/2016, 11:17 AM

## 2016-09-22 NOTE — Care Management (Signed)
CM and attending met with patient and her husband.  He continues to challenge CM and attending regarding physical therapy recommendations.  Says that patient is usually very active and has concerns about her mind being slow.  After much discussion and repetition of previous conversations regarding home health services and desire to go to skilled nursing will initiate a bed search.  Discussed that the physical therapy notes will be sent with the home health recommendations and if facility made a bed offer, then he could accept.  He wishes to have guarantee that the skilled nursing stay would be covered.  Informed there is no guarantee.  He does not want to go to [facility].  Updated CSW. Discussed today patient is medically stable for discharge and if did not accept bed offer, then would discharge home.  Also again discussed IM notices and appeal.  Stated would rather not "have to do that."  Husband initiated discussion that this CM has not informed him of role of CM.  CM replied that role was explained in detail to patient and role and was explained to him last Friday when we met.  CM had also left a business card in patient room last week.  Provided another today

## 2016-09-22 NOTE — Discharge Instructions (Signed)
Heart Failure Clinic appointment on Sep 29, 2016 at 9:20am with Darylene Price, [place]. Please call [phone] to reschedule.

## 2016-09-22 NOTE — Progress Notes (Signed)
Discharge: Pt d/c from room via wheelchair, Family member with the pt. Discharge instructions given to the patient and family members.  No questions from pt, reintegrated to the pt to call or go to the ED for chest discomfort. Pt dressed in street clothes and left with discharge papers and prescriptions in hand. IV d/ced, tele removed and no complaints of pain or discomfort. 

## 2016-09-22 NOTE — Telephone Encounter (Signed)
Darlene Lawrence notified as instructed by telephone. She states she got a note this morning that Mrs. Kogler still has not been discharged from the hospital.

## 2016-09-22 NOTE — Clinical Social Work Note (Signed)
CSW was reconsulted per family request of going to a SNF. CSW met with pt and husband to address consult for New SNF. CSW introduced herself and explained role of social work. CSW also explained that the recommendation of PT is HHPT and pt does not meet the criteria for SNF for STR. Therefore Medicare will not cover going to SNF. CSW offered [facility] and [facility]. Pt and husband accepted the list. Pt chose [facility]. CSW updated RNCM and RN. CSW is signing off as no further needs identified.   Darden Dates, MSW, LCSW  Clinical Social Worker  [phone]

## 2016-09-22 NOTE — Progress Notes (Signed)
Pt alert and oriented x4, no complaints of pain or discomfort.  Bed in low position, call bell within reach.  Bed alarms on and functioning.  Assessment done and charted.  Will continue to monitor and do hourly rounding throughout the shift 

## 2016-09-22 NOTE — Clinical Social Work Note (Signed)
CSW consult for New SNF. PT is still recommending HHPT and will not qualify for SNF placement for STR. RNCM is following for discharge planning needs. CSW is signing off as pt is ready for discharge today and will report home with home health services.   Darden Dates, MSW, LCSW Clinical Social Worker  [phone]

## 2016-09-22 NOTE — Care Management (Signed)
CM spoke with patient and her husband regarding discharge today. Husband and now patient desire to go to skilled nursing facility.  Discussed that physical therapy will reassess again today and if  the recommendation is again home health may proceed to skilled nursing but medicare would not cover.  Husband says that "that is not what she told me yesterday."  Asked for the name or discipline of the person he had spoken with and was unable to give specifics.  After further discussion, it sounds that this was advice from an acquainence  over the phone that is not connected to patient's plan of care.   Husband then asks for this CM name and is taking notes.  Discussed the disciplines that would be seeing patient through home health and husband says"they said she would only be seen twice a week."  Discussed disciplines- SN PT OT Aide, how visits are front loaded with increased frequency initially then decrease as plan of care progresses.  Discussed the need for home oxygen and would not necessarily be permanent.  Husband states that the need of oxygen shows that patient's lungs are still weak and asks if CM ever sent patients home on oxygen.  Reminded patient and husband of IM notice rights if he feels patient is not medical stable of right to appeal discharge.

## 2016-09-22 NOTE — Progress Notes (Signed)
Physical Therapy Treatment Patient Details Name: Darlene Lawrence MRN: [id number] DOB: [birth year] Today's Date: 09/22/2016    History of Present Illness 76 y/o female here with 2-3 weeks of weakness, nausea, vomiting.  She was found to have had a heart attack and pneumonia.  She was in the CCU for a few days and is now on the floor.  Pt with history of hip and knee replacements.     PT Comments    Patient able to further increase gait distance this session, completing full lap around nursing station with RW, no greater than close sup for safety.  Does maintain slow cadence and overall gait speed, but demonstrates no buckling or LOB.  Able to complete sit/stand with RW from various surface heights (BSC, recliner, edge of bed) without change in performance or level of assist required.  Continues to demonstrate ability to negotiate household distances/environments, appropriate for HHPT upon discharge.  Do recommend continued use of RW for optimal safety and energy conservation.  Patient and husband voice understanding; has RW in home already.  SaO2 on room air at rest = 85% SaO2 on 2L at rest = 92% SaO2 on 2 liters of O2 while ambulating = 90%    Follow Up Recommendations  Home health PT     Equipment Recommendations  Rolling walker with 5" wheels (patient has RW in home already)    Recommendations for Other Services       Precautions / Restrictions Precautions Precautions: Fall Restrictions Weight Bearing Restrictions: No    Mobility  Bed Mobility               General bed mobility comments: seated on BSC upon arrival to session; in recliner end of session  Transfers Overall transfer level: Needs assistance Equipment used: Rolling walker (2 wheeled) Transfers: Sit to/from Stand Sit to Stand: Supervision;Modified independent (Device/Increase time)         General transfer comment: improving LE strength/power with movement transitions; less use of bilat UEs required.   Able to complete from both [facility] and recliner wtihout change in performance or level of assist required.  Ambulation/Gait Ambulation/Gait assistance: Supervision Ambulation Distance (Feet): 220 Feet Assistive device: Rolling walker (2 wheeled)   Gait velocity: 10' walk time, 14-15 seconds   General Gait Details: reciprocal stepping pattern with slow, but steady, gait pattern.  Mod WBing on RW for external support, but no buckling or LOB.  Tolerating marked improvement in gait distance; do recommend continued use of RW at all times.   Stairs            Wheelchair Mobility    Modified Rankin (Stroke Patients Only)       Balance Overall balance assessment: Needs assistance Sitting-balance support: No upper extremity supported;Feet supported Sitting balance-Leahy Scale: Normal     Standing balance support: Bilateral upper extremity supported Standing balance-Leahy Scale: Fair                              Cognition Arousal/Alertness: Awake/alert Behavior During Therapy: WFL for tasks assessed/performed Overall Cognitive Status: Within Functional Limits for tasks assessed                                        Exercises Other Exercises Other Exercises: Toilet transfer, SPT without assist device, cga/close sup-does reach for bedrails, furniture for external stabilization;  static standing balance for hygiene, cga/close sup. Other Exercises: Sit/stand x5 with RW, sup, for LE strength/endurance.  Good hand placement and control with movement transitions Other Exercises: Standing LE therex, 1x10, AROM for muscular strength/endurance with RW: heel raises, hip flex/ext/abduct/adduct.  Reviewed techniques for progression (increased reps, decreased UE support-while maintaining walker with/near) and increased challenge to balance as recovery progresses (when using for HEP); patient voiced understanding.    General Comments        Pertinent Vitals/Pain  Pain Assessment: No/denies pain    Home Living                      Prior Function            PT Goals (current goals can now be found in the care plan section) Acute Rehab PT Goals Patient Stated Goal: get stronger PT Goal Formulation: With patient Time For Goal Achievement: 10/02/16 Potential to Achieve Goals: Good Progress towards PT goals: Progressing toward goals    Frequency    Min 2X/week      PT Plan Current plan remains appropriate    Co-evaluation              AM-PAC PT "6 Clicks" Daily Activity  Outcome Measure  Difficulty turning over in bed (including adjusting bedclothes, sheets and blankets)?: None Difficulty moving from lying on back to sitting on the side of the bed? : None Difficulty sitting down on and standing up from a chair with arms (e.g., wheelchair, bedside commode, etc,.)?: None Help needed moving to and from a bed to chair (including a wheelchair)?: A Little Help needed walking in hospital room?: A Little Help needed climbing 3-5 steps with a railing? : A Little 6 Click Score: 21    End of Session Equipment Utilized During Treatment: Gait belt;Oxygen Activity Tolerance: Patient tolerated treatment well Patient left: in chair;with call bell/phone within reach;with family/visitor present Nurse Communication: Mobility status PT Visit Diagnosis: Muscle weakness (generalized) (M62.81);Difficulty in walking, not elsewhere classified (R26.2)     Time: 1010-1037 PT Time Calculation (min) (ACUTE ONLY): 27 min  Charges:  $Gait Training: 8-22 mins $Therapeutic Activity: 8-22 mins                    G Codes:      Ammarie Matsuura H. Owens Shark, PT, DPT, NCS 09/22/16, 12:57 PM [phone]

## 2016-09-22 NOTE — Care Management Note (Signed)
Case Management Note  Patient Details  Name: Darlene Lawrence MRN: [id number] Date of Birth: 23-Oct-1940  Subjective/Objective:                 Husband has decided to discharge home with home health rather than skilled nursing.  Provided with list of dme agencies that can provide oxygen.   [facility]ose [facility]   Action/Plan:  Notified [facility] of concerns that have been dealt with today regarding discharge disposition.  Order for SN PT OT Aide and SW present.  [facility] referral screen made. Oxygen referral called to [facility].     Expected Discharge Date:  09/22/16               Expected Discharge Plan:     In-House Referral:     Discharge planning Services     Post Acute Care Choice:    Choice offered to:     DME Arranged:    DME Agency:     HH Arranged:    HH Agency:     Status of Service:     If discussed at [employer], dates discussed:    Additional Comments:  Katrina Stack, RN 09/22/2016, 2:25 PM

## 2016-09-22 NOTE — Care Management (Signed)
There is a social work consult placed yesterday late pm for short term placement because "husband does not feel he can cope with patient at home."  CM had discussion with patient and husband 5/11 regarding same. Physical therapy reassessed patient 5/11 and recommended home health. Attending has requested physical therapy to reassess patient today for snf.  Home health referral has been made and if discharges home, patient will required home 02.

## 2016-09-23 ENCOUNTER — Telehealth: Payer: Self-pay | Admitting: *Deleted

## 2016-09-23 NOTE — Telephone Encounter (Signed)
Attempted to contact pt to complete TCM. Phone line busy

## 2016-09-24 ENCOUNTER — Other Ambulatory Visit: Payer: Self-pay | Admitting: *Deleted

## 2016-09-24 ENCOUNTER — Telehealth: Payer: Self-pay | Admitting: *Deleted

## 2016-09-24 ENCOUNTER — Encounter: Payer: Self-pay | Admitting: *Deleted

## 2016-09-24 DIAGNOSIS — I130 Hypertensive heart and chronic kidney disease with heart failure and stage 1 through stage 4 chronic kidney disease, or unspecified chronic kidney disease: Secondary | ICD-10-CM | POA: Diagnosis not present

## 2016-09-24 DIAGNOSIS — Z9981 Dependence on supplemental oxygen: Secondary | ICD-10-CM | POA: Diagnosis not present

## 2016-09-24 DIAGNOSIS — I5021 Acute systolic (congestive) heart failure: Secondary | ICD-10-CM | POA: Diagnosis not present

## 2016-09-24 DIAGNOSIS — N183 Chronic kidney disease, stage 3 (moderate): Secondary | ICD-10-CM | POA: Diagnosis not present

## 2016-09-24 DIAGNOSIS — I214 Non-ST elevation (NSTEMI) myocardial infarction: Secondary | ICD-10-CM | POA: Diagnosis not present

## 2016-09-24 DIAGNOSIS — Z8701 Personal history of pneumonia (recurrent): Secondary | ICD-10-CM | POA: Diagnosis not present

## 2016-09-24 DIAGNOSIS — I4510 Unspecified right bundle-branch block: Secondary | ICD-10-CM | POA: Diagnosis not present

## 2016-09-24 DIAGNOSIS — M1710 Unilateral primary osteoarthritis, unspecified knee: Secondary | ICD-10-CM | POA: Diagnosis not present

## 2016-09-24 NOTE — Patient Outreach (Signed)
[place] [facility]) Care Management [place] Telephone Outreach, Transition of Care day 1  09/24/2016  Darlene Lawrence 1940-10-16 [id number]  Successful telephone outreach to Darlene Lawrence, 76 y/o female referred to [place] for transition of care after hospitalization May 4-14, 2018 for CAP/ CHF exacerbation and NSTEMI.  Patient was discharged home with home health [facility]) services through [place].  Patient has history including, but not limited to, breast cancer, HTN, HLD, (R) BBB, and CKD stage III.  HIPAA/ identity verified with patient during phone call today.  Discussed [facility] CM services with patient, explained difference between [facility] CM and home health services; patient provided verbal consent for [facility] CM involvement in her care today.  Today, patient states that she "is glad to be home, but is still feeling kind of lousy."  Patient sounds to be in no obvious distress throughout entirety of 25 minute phone call today.   -- Verified that [facility] services have started through [place]; reports "intake nurse was here today."  Reports services for PT, OT, RN, and bath aide in place.  -- Has all medications and takes as prescribed; patient verbalizes an accurate general understanding of purpose, dosing, and scheduling of prescribed medications.  Independently manages medications using weekly pill planner box; denies problems with swallowing medications.  Accurately reports changes in medications at time of hospital discharge.  Denies questions about current medications. Patient was recently discharged from hospital and all medications were thoroughly reviewed today.  -- Advanced Directive Planning:  Confirms that she has active HCPOA and Living Will; declines need to make changes at present.  -- [employer] Needs:  Currently, denies need for community resources, stating that she has supportive family actively involved/ available for care needs.  Reports  husband will transport to all provider appointments post-hospital discharge.  Has 2 living sons, both live locally.  -- Provider appointments:  Provides accurate report of upcoming provider appointments as noted in EMR, verbalizes plans to attend all scheduled appointments.  -- Safety/ Mobility/ Falls:  Denies falls in past year; currently using walker for ambulation, "until I get stronger after being sick."  General fall risks/ prevention education discussed with patient today.  -- Self-health management of chronic disease state of CHF:  Reports currently has not been monitoring/ recording daily weights, verbalizes plans to "start tomorrow."  Verbalizes accurate understanding to contact provider for weight gain of 2 pounds overnight, 5 pounds in one week.  Was started on O2 at home at time of hospital discharge 09/22/16, using "all the time" at 2 L/min via [place] with home oxygenator.  Denies questions about use of O2.  Denies issues with shortness of breath, LE swelling.  CHF zones briefly discussed with patient today, as she explained that she is tired and would like to rest.  Patient denies further issues, concerns, or problems today.  I provided patient with my direct phone number, the main [place] office phone number, and the [facility] CM 24-hour nurse advice phone number should issues arise prior to next scheduled [place] outreach by phone next week.  Patient declines scheduling [facility] RN CM initial home visit at this time, stating that she wishes to wait until "things calm down" after being discharged from hospital.  Plan:  Patient will take medications as prescribed and will attend all scheduled provider appointments.  Patient will actively participate in home health [facility]) services as ordered post-hospital discharge.  Patient will monitor and record daily weights,  and will report weight gain (>2 lbs overnight/ 5 lbs in one week) to providers.  Patient will promptly notify providers of any new  concerns, problems, or issues post- hospital discharge.  I will make patient's PCP aware of [facility] CM involvement in patient's care post-hospital discharge.  [place] outreach for ongoing transition of care to continue with scheduled telephone call next week.  Oneta Rack, RN, BSN, [employer] Coordinator [facility] Care Management  [phone]              **Incomplete note/ documentation in progress/ LMT**

## 2016-09-24 NOTE — Telephone Encounter (Signed)
Verbal order given to [facility] for nursing 2 x a week for 8 weeks and 2 prn visits for CHF education.

## 2016-09-24 NOTE — Telephone Encounter (Signed)
Transition Care Management Follow-up Telephone Call   Date discharged? 09/22/2016    How have you been since you were released from the hospital? "terrible"   Do you understand why you were in the hospital? yes   Do you understand the discharge instructions? yes   Where were you discharged to? home   Items Reviewed:  Medications reviewed: yes  Allergies reviewed: yes  Dietary changes reviewed: n/a   Referrals reviewed: no   Functional Questionnaire:   Activities of Daily Living (ADLs):    States they require assistance with the following: pt is needing assistance withADLs but states someone was at her home this am to make arrangements for her   Any transportation issues/concerns?: no   Any patient concerns? no   Confirmed importance and date/time of follow-up visits scheduled yes  Provider Appointment booked with Dr Diona Browner 09/30/16 @ 1430  Confirmed with patient if condition begins to worsen call PCP or go to the ER.  Patient was given the office number and encouraged to call back with question or concerns.  : yes

## 2016-09-24 NOTE — Telephone Encounter (Signed)
Darlene Lawrence nurse with [facility] left a voicemail requesting orders for new patient for them.  Darlene Lawrence is requesting nursing 2 x a week for 8 weeks and 2 prn visits for CHF education. Darlene Lawrence is requesting a call back with verbal order.

## 2016-09-25 DIAGNOSIS — Z8701 Personal history of pneumonia (recurrent): Secondary | ICD-10-CM | POA: Diagnosis not present

## 2016-09-25 DIAGNOSIS — I5021 Acute systolic (congestive) heart failure: Secondary | ICD-10-CM | POA: Diagnosis not present

## 2016-09-25 DIAGNOSIS — I130 Hypertensive heart and chronic kidney disease with heart failure and stage 1 through stage 4 chronic kidney disease, or unspecified chronic kidney disease: Secondary | ICD-10-CM | POA: Diagnosis not present

## 2016-09-25 DIAGNOSIS — M1710 Unilateral primary osteoarthritis, unspecified knee: Secondary | ICD-10-CM | POA: Diagnosis not present

## 2016-09-25 DIAGNOSIS — I214 Non-ST elevation (NSTEMI) myocardial infarction: Secondary | ICD-10-CM | POA: Diagnosis not present

## 2016-09-25 DIAGNOSIS — N183 Chronic kidney disease, stage 3 (moderate): Secondary | ICD-10-CM | POA: Diagnosis not present

## 2016-09-26 ENCOUNTER — Encounter: Payer: Self-pay | Admitting: *Deleted

## 2016-09-26 ENCOUNTER — Other Ambulatory Visit: Payer: Self-pay | Admitting: *Deleted

## 2016-09-26 DIAGNOSIS — Z8701 Personal history of pneumonia (recurrent): Secondary | ICD-10-CM | POA: Diagnosis not present

## 2016-09-26 DIAGNOSIS — I130 Hypertensive heart and chronic kidney disease with heart failure and stage 1 through stage 4 chronic kidney disease, or unspecified chronic kidney disease: Secondary | ICD-10-CM | POA: Diagnosis not present

## 2016-09-26 DIAGNOSIS — N183 Chronic kidney disease, stage 3 (moderate): Secondary | ICD-10-CM | POA: Diagnosis not present

## 2016-09-26 DIAGNOSIS — I5021 Acute systolic (congestive) heart failure: Secondary | ICD-10-CM | POA: Diagnosis not present

## 2016-09-26 DIAGNOSIS — M1710 Unilateral primary osteoarthritis, unspecified knee: Secondary | ICD-10-CM | POA: Diagnosis not present

## 2016-09-26 DIAGNOSIS — I214 Non-ST elevation (NSTEMI) myocardial infarction: Secondary | ICD-10-CM | POA: Diagnosis not present

## 2016-09-26 NOTE — Patient Outreach (Signed)
[place] [facility]) Care Management [place], Transition of Care day 3 Follow up from [place] from 09/25/16 09/26/2016  Darlene Lawrence March 28, 1941 [id number]  Successful telephone outreach to Darlene Lawrence, husband of Darlene Lawrence, 76 y/o female referred to [place] for transition of care after hospitalization May 4-14, 2018 for CAP/ CHF exacerbation and NSTEMI.  Patient was discharged home with home health [facility]) services through [place].  Patient has history including, but not limited to, breast cancer, HTN, HLD, (R) BBB, and CKD stage III.  HIPAA/ identity verified with patient's husband during phone call today.  Explained that I was calling to follow up on RED EMMI notification from [place] call placed to patient yesterday; patient's husband reported that patient is currently resting, and doing "overall well," and denies concerns, issues, or problems.  Patient's husband asked that all EMMI automated calls be stopped, as he said that patient "is very annoyed with the calls, and responded to the last automated call by saying, 'STOP CALLING ME,' as she did not know how to make the calls stop coming in.  Explained the role of [facility] CM RN for transition of care to husband, who verbalizes understanding.  Provided my direct phone number to husband, and shared that I would attempt to contact patient for ongoing follow up/ to schedule home visit next week, and he was agreeable to this plan.    Plan:  [facility] CMA notified by secure messaging through EMR to stop automated EMMI calls for patient immediately, at patient's request.  Patient will take medications as prescribed and will attend all scheduled provider appointments.  Patient will actively participate in home health [facility]) services as ordered post-hospital discharge.  Patient will monitor and record daily weights, and will report weight gain (>2 lbs overnight/ 5 lbs in one week) to  providers.  Patient will promptly notify providers of any new concerns, problems, or issues post- hospital discharge.  [place] outreach for ongoing transition of care to continue with scheduled telephone call next week.  Oneta Rack, RN, BSN, [employer] [facility] Care Management  [phone]

## 2016-09-26 NOTE — Patient Outreach (Signed)
[place] [facility]) Care Management  09/26/2016  Darlene Lawrence Apr 20, 1941 [id number]   Patient triggered Red on EMMI general discharge dashboard, notification sent to Zettie Pho, RN

## 2016-09-29 ENCOUNTER — Encounter: Payer: Self-pay | Admitting: Family

## 2016-09-29 ENCOUNTER — Other Ambulatory Visit: Payer: Self-pay | Admitting: Cardiology

## 2016-09-29 ENCOUNTER — Ambulatory Visit: Payer: Medicare Other | Attending: Family | Admitting: Family

## 2016-09-29 VITALS — BP 108/50 | HR 51 | Resp 20 | Ht 66.0 in | Wt 210.0 lb

## 2016-09-29 DIAGNOSIS — I252 Old myocardial infarction: Secondary | ICD-10-CM | POA: Diagnosis not present

## 2016-09-29 DIAGNOSIS — E785 Hyperlipidemia, unspecified: Secondary | ICD-10-CM | POA: Insufficient documentation

## 2016-09-29 DIAGNOSIS — Z880 Allergy status to penicillin: Secondary | ICD-10-CM | POA: Insufficient documentation

## 2016-09-29 DIAGNOSIS — I10 Essential (primary) hypertension: Secondary | ICD-10-CM | POA: Diagnosis not present

## 2016-09-29 DIAGNOSIS — E7800 Pure hypercholesterolemia, unspecified: Secondary | ICD-10-CM | POA: Insufficient documentation

## 2016-09-29 DIAGNOSIS — Z79899 Other long term (current) drug therapy: Secondary | ICD-10-CM | POA: Insufficient documentation

## 2016-09-29 DIAGNOSIS — Z7982 Long term (current) use of aspirin: Secondary | ICD-10-CM | POA: Diagnosis not present

## 2016-09-29 DIAGNOSIS — Z923 Personal history of irradiation: Secondary | ICD-10-CM | POA: Insufficient documentation

## 2016-09-29 DIAGNOSIS — N183 Chronic kidney disease, stage 3 (moderate): Secondary | ICD-10-CM | POA: Diagnosis not present

## 2016-09-29 DIAGNOSIS — I214 Non-ST elevation (NSTEMI) myocardial infarction: Secondary | ICD-10-CM

## 2016-09-29 DIAGNOSIS — I110 Hypertensive heart disease with heart failure: Secondary | ICD-10-CM | POA: Diagnosis not present

## 2016-09-29 DIAGNOSIS — I5032 Chronic diastolic (congestive) heart failure: Secondary | ICD-10-CM | POA: Insufficient documentation

## 2016-09-29 NOTE — Patient Instructions (Signed)
Continue weighing daily and call for an overnight weight gain of > 2 pounds or a weekly weight gain of >5 pounds. 

## 2016-09-29 NOTE — Progress Notes (Signed)
Patient ID: Darlene Lawrence, female    DOB: 1941/02/16, 75 y.o.   MRN: [id number]  HPI  Darlene Lawrence is a 76 y/o female with a history of arthritis, breast cancer, hyperlipidemia, HTN, radiation therapy and chronic heart failure.   Reviewed last echo report done on 09/12/16 which showed an EF of 40-45%. Cardiac catheterization was done 09/17/16 which showed no coronary artery disease.   Admitted 09/12/16 with pneumonia and NSTEMI. Given IV antibiotics and oxygen which were still required at discharge. Cardiology consult obtained and catheterization was done showing normal coronary arteries. Referral made to cardiac rehab. Discharged to home.   She presents today for her initial visit with a chief complaint of mild shortness of breath with moderate exertion such as when walking upstairs. She describes this as chronic in nature over the last few month but is improving. She has associated fatigue and cough along with this. Wearing oxygen and resting helps improve her shortness of breath.   Past Medical History:  Diagnosis Date  . Abnormal Pap smear   . Arthritis   . breast cancer 2009   right  . Cancer ([place])   . CHF (congestive heart failure) ([place])   . DDD (degenerative disc disease), lumbar   . High cholesterol   . Hyperlipidemia   . Hypertension   . Meniscus tear    right  . Personal history of radiation therapy   . RBBB    Past Surgical History:  Procedure Laterality Date  . BREAST BIOPSY Right 08/18/2007  . BREAST LUMPECTOMY Right 2009  . BREAST SURGERY Right 2009   lumpectomy, no radiation, no chemo  . DILATION AND CURETTAGE OF UTERUS  2012   Dr. Fermin [facility]  . HERNIA REPAIR     umbilical  . JOINT REPLACEMENT Left    hip  . LEFT HEART CATH AND CORONARY ANGIOGRAPHY N/A 09/17/2016   Procedure: Left Heart Cath and Coronary Angiography;  Surgeon: Teodoro Spray, MD;  Location: [facility];  Service: Cardiovascular;  Laterality: N/A;  . TOTAL HIP ARTHROPLASTY Left   .  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 TUBAL LIGATION     Family History  Problem Relation Age of Onset  . Cancer Mother        breast and colon  . Breast cancer Mother   . Hypertension Father   . Heart disease Father   . Thyroid disease Father   . Down syndrome Son   . Asthma Other    Social History  Substance Use Topics  . Smoking status: Never Smoker  . Smokeless tobacco: Never Used  . Alcohol use Yes     Comment: once a month   Allergies  Allergen Reactions  . Cefixime     REACTION: Rash  . Levaquin [Levofloxacin] Other (See Comments)    Shoulder Bursitis  . Penicillins     REACTION: Rash   Prior to Admission medications   Medication Sig Start Date End Date Taking? Authorizing Provider  Aspirin (ASPIR-81 PO) Take 1 tablet by mouth.   Yes [provider]  b complex vitamins tablet Take 1 tablet by mouth daily.   Yes [provider]  Calcium Citrate-Vitamin D (CALCIUM CITRATE + PO) Take 1 tablet by mouth daily.   Yes [provider]  Cholecalciferol (VITAMIN D3) 2000 UNITS TABS Take 1 capsule  by mouth 2 (two) times daily.   Yes [provider]  Coenzyme Q10 (COQ10) 100 MG CAPS Take 1 capsule by mouth daily.   Yes [provider]  feeding supplement, ENSURE ENLIVE, (ENSURE ENLIVE) LIQD Take 237 mLs by mouth daily. 09/22/16 10/23/16 Yes Mody, Ulice Bold, MD  furosemide (LASIX) 20 MG tablet Take 1 tablet (20 mg total) by mouth daily. 09/22/16  Yes Bettey Costa, MD  metoprolol tartrate (LOPRESSOR) 25 MG tablet Take 1 tablet (25 mg total) by mouth 2 (two) times daily. 09/22/16  Yes Mody, Ulice Bold, MD  Misc Natural Products (GLUCOSAMINE CHOND MSM FORMULA PO) Take 1,500 mg by mouth.   Yes [provider]  Multiple Vitamin (MULTI VITAMIN DAILY PO) Take 1 tablet by mouth.   Yes [provider]   Omega-3 Fatty Acids (FISH OIL) 1200 MG CAPS Take 1 capsule by mouth.   Yes [provider]  rosuvastatin (CRESTOR) 10 MG tablet Take 10 mg by mouth 2 (two) times a week. Monday, Thursday. 11/05/10  Yes Bedsole, Amy E, MD     Review of Systems  Constitutional: Positive for fatigue (improving). Negative for appetite change.  HENT: Negative for congestion, postnasal drip and sore throat.   Eyes: Negative.   Respiratory: Positive for cough and shortness of breath (with walking upstairs). Negative for chest tightness.   Cardiovascular: Negative for chest pain, palpitations and leg swelling.  Gastrointestinal: Negative for abdominal distention and abdominal pain.  Endocrine: Negative.   Genitourinary: Negative.   Musculoskeletal: Negative for back pain and neck pain.  Skin: Negative.   Allergic/Immunologic: Negative.   Neurological: Negative for dizziness and light-headedness.  Hematological: Negative for adenopathy. Bruises/bleeds easily.  Psychiatric/Behavioral: Positive for sleep disturbance (sleeping on 1 pillow; didn't sleep well last night). Negative for dysphoric mood. The patient is not nervous/anxious.    Vitals:   09/29/16 0924  BP: (!) 108/50  Pulse: (!) 51  Resp: 20  SpO2: 96%  Weight: 210 lb (95.3 kg)  Height: 5\' 6"  (1.676 m)   Wt Readings from Last 3 Encounters:  09/29/16 210 lb (95.3 kg)  09/22/16 213 lb 11.2 oz (96.9 kg)  09/12/16 213 lb 8 oz (96.8 kg)   Lab Results  Component Value Date   CREATININE 0.78 09/19/2016   CREATININE 0.70 09/18/2016   CREATININE 0.82 09/17/2016    Physical Exam  Constitutional: She is oriented to person, place, and time. She appears well-developed and well-nourished.  HENT:  Head: Normocephalic and atraumatic.  Neck: Normal range of motion. Neck supple. No JVD present.  Cardiovascular: Regular rhythm.  Bradycardia present.   Pulmonary/Chest: Effort normal. She has no wheezes. She has no rales.  Abdominal: Soft. She  exhibits no distension. There is no tenderness.  Musculoskeletal: She exhibits no edema or tenderness.  Neurological: She is alert and oriented to person, place, and time.  Skin: Skin is warm and dry.  Psychiatric: She has a normal mood and affect. Her behavior is normal. Thought content normal.  Nursing note and vitals reviewed.    Assessment & Plan:  1: Chronic heart failure with preserved ejection fraction- - NYHA class II - euvolemic - already weighing daily. Discussed calling for an overnight weight gain of >2 pounds or a weekly weight gain of >5 pounds - not adding salt and reading food labels "some". Discussed the importance of closely following a 2000mg  sodium diet and written dietary information was given to her about this - sees her cardiologist Ubaldo Glassing) later today - continues to  wear her oxygen at 2L "almost" around the clock - reviewed BMP drawn on 09/18/16; potassium 4.0 and GFR >60  2: HTN- - BP looks good today - continue medications - sees PCP Diona Browner) on 09/30/16   3: NSTEMI- - pamphlet on cardiac rehab given to patient to discuss with her cardiologist  Medication list was reviewed.  Return here in 1 month or sooner for any questions/problems before then.

## 2016-09-30 ENCOUNTER — Encounter: Payer: Self-pay | Admitting: Family Medicine

## 2016-09-30 ENCOUNTER — Ambulatory Visit (INDEPENDENT_AMBULATORY_CARE_PROVIDER_SITE_OTHER): Payer: Medicare Other | Admitting: Family Medicine

## 2016-09-30 VITALS — BP 116/80 | HR 60 | Temp 98.6°F | Ht 64.5 in | Wt 212.2 lb

## 2016-09-30 DIAGNOSIS — I10 Essential (primary) hypertension: Secondary | ICD-10-CM

## 2016-09-30 DIAGNOSIS — J189 Pneumonia, unspecified organism: Secondary | ICD-10-CM

## 2016-09-30 DIAGNOSIS — Z8701 Personal history of pneumonia (recurrent): Secondary | ICD-10-CM | POA: Diagnosis not present

## 2016-09-30 DIAGNOSIS — M1710 Unilateral primary osteoarthritis, unspecified knee: Secondary | ICD-10-CM | POA: Diagnosis not present

## 2016-09-30 DIAGNOSIS — I5021 Acute systolic (congestive) heart failure: Secondary | ICD-10-CM | POA: Diagnosis not present

## 2016-09-30 DIAGNOSIS — N183 Chronic kidney disease, stage 3 unspecified: Secondary | ICD-10-CM

## 2016-09-30 DIAGNOSIS — I214 Non-ST elevation (NSTEMI) myocardial infarction: Secondary | ICD-10-CM

## 2016-09-30 DIAGNOSIS — I130 Hypertensive heart and chronic kidney disease with heart failure and stage 1 through stage 4 chronic kidney disease, or unspecified chronic kidney disease: Secondary | ICD-10-CM | POA: Diagnosis not present

## 2016-09-30 DIAGNOSIS — I5023 Acute on chronic systolic (congestive) heart failure: Secondary | ICD-10-CM | POA: Diagnosis not present

## 2016-09-30 DIAGNOSIS — I5030 Unspecified diastolic (congestive) heart failure: Secondary | ICD-10-CM | POA: Insufficient documentation

## 2016-09-30 NOTE — Assessment & Plan Note (Signed)
Followed at [facility].. Currently euvolemic.  Continue bblocker, diuretic, asa and statin

## 2016-09-30 NOTE — Progress Notes (Signed)
Subjective:    Patient ID: Darlene Lawrence, female    DOB: 1941/01/09, 76 y.o.   MRN: [id number]  HPI   76 year old female presents for follow up hospitalization. Admitted  On 5/4 from the office for SOB fever Discharged on 5/14 Dx with CA PNA NSTEMI  CHF EF 40-45%  Hospital course as follows copied for information only: 1. Acute hypoxic respiratory failure in the setting of community acquired pneumonia and CHF Patient was on supplemental oxygen Due to multiple allergy she was on aztreonam and doxycycline. She has completed treatment for pneumonia and no longer needs antibiotics. She is Euvolemic at this time, however still is requiring O2 at discharge. She will require Lasix at discharge with follow up with Cardiology and CHF clinic.  2. NSTEMI: Troponins were initially greater than 40. Patient underwent cardiac catheterization and interestingly cardiac catheterization showed normal coronary arteries.  She underwent echocardiogram which showed  Hypokinesis of the basal anteroseptal and basal anterolateral myocardium however ejection fraction of 40-45%. She will be discharged on aspirin, statin and metoprolol.  She will be referred to cardiac rehabilitation  3. Essential hypertension: Patient will be discharged on Metoprolol. Dyazide has been discontinued.  4.AKI: This is resolved with holding outpatient diuretic.  5. Hyponatremia: This is resolved with holding outpatient diuretic. 6. Electrolyte abnormalities: These were all repleted.    09/29/2016 Followed up with cardiology in hear failure clinic and with Dr. Ubaldo Glassing.  TODAY 09/30/16  She reports  Some improvement in SOB and fatigue. Using oxygen somewhat continuously.  Renal function at nml range at discharge.  She has completed antibiotics.  She has had no further fever. She was started on ASA, statin (crestor 10 mg daily) and metoprolol  Using lasix for fluid control,   Has home health PT  Now 3 times a  week. Follow up with cardiology in 6 weeks.  BP Readings from Last 3 Encounters:  09/30/16 116/80  09/29/16 (!) 108/50  09/22/16 (!) 100/57   Wt Readings from Last 3 Encounters:  09/30/16 212 lb 4 oz (96.3 kg)  09/29/16 210 lb (95.3 kg)  09/22/16 213 lb 11.2 oz (96.9 kg)     Review of Systems  Constitutional: Positive for fatigue. Negative for fever.  HENT: Negative for congestion.   Eyes: Negative for pain.  Respiratory: Positive for shortness of breath. Negative for cough.   Cardiovascular: Negative for chest pain, palpitations and leg swelling.  Gastrointestinal: Negative for abdominal pain.  Genitourinary: Negative for dysuria and vaginal bleeding.  Musculoskeletal: Negative for back pain.  Neurological: Negative for syncope, light-headedness and headaches.  Psychiatric/Behavioral: Negative for dysphoric mood.       Objective:   Physical Exam  Constitutional: Vital signs are normal. She appears well-developed and well-nourished. She is cooperative.  Non-toxic appearance. She does not appear ill. No distress.  wearing oxygen in office  HENT:  Head: Normocephalic.  Right Ear: Hearing, tympanic membrane, external ear and ear canal normal. Tympanic membrane is not erythematous, not retracted and not bulging.  Left Ear: Hearing, tympanic membrane, external ear and ear canal normal. Tympanic membrane is not erythematous, not retracted and not bulging.  Nose: No mucosal edema or rhinorrhea. Right sinus exhibits no maxillary sinus tenderness and no frontal sinus tenderness. Left sinus exhibits no maxillary sinus tenderness and no frontal sinus tenderness.  Mouth/Throat: Uvula is midline, oropharynx is clear and moist and mucous membranes are normal.  Eyes: Conjunctivae, EOM and lids are normal. Pupils are equal, round, and  reactive to light. Lids are everted and swept, no foreign bodies found.  Neck: Trachea normal and normal range of motion. Neck supple. Carotid bruit is not  present. No thyroid mass and no thyromegaly present.  Cardiovascular: Normal rate, regular rhythm, S1 normal, S2 normal, normal heart sounds, intact distal pulses and normal pulses.  Exam reveals no gallop and no friction rub.   No murmur heard. Pulmonary/Chest: Effort normal and breath sounds normal. No tachypnea. No respiratory distress. She has no decreased breath sounds. She has no wheezes. She has no rhonchi. She has no rales.  Abdominal: Soft. Normal appearance and bowel sounds are normal. There is no tenderness.  Neurological: She is alert.  Skin: Skin is warm, dry and intact. No rash noted.  Psychiatric: Her speech is normal and behavior is normal. Judgment and thought content normal. Her mood appears not anxious. Cognition and memory are normal. She does not exhibit a depressed mood.          Assessment & Plan:

## 2016-09-30 NOTE — Assessment & Plan Note (Signed)
BAck to baseline at discharge. Will re-eval in 1 month on current dose lasix.

## 2016-09-30 NOTE — Assessment & Plan Note (Signed)
nml coronaries on cath 09/2016 Followed by Dr. Ubaldo Glassing

## 2016-09-30 NOTE — Assessment & Plan Note (Signed)
Resolved. Remains oxygen dependant.Rich Brave as able.

## 2016-09-30 NOTE — Assessment & Plan Note (Signed)
Well controlled. Continue current medication.  

## 2016-09-30 NOTE — Addendum Note (Signed)
Addended by: Eliezer Lofts E on: 09/30/2016 04:53 PM   Modules accepted: Level of Service

## 2016-10-01 ENCOUNTER — Ambulatory Visit: Payer: Medicare Other | Admitting: *Deleted

## 2016-10-01 ENCOUNTER — Other Ambulatory Visit: Payer: Self-pay | Admitting: *Deleted

## 2016-10-01 ENCOUNTER — Encounter: Payer: Self-pay | Admitting: *Deleted

## 2016-10-01 DIAGNOSIS — I130 Hypertensive heart and chronic kidney disease with heart failure and stage 1 through stage 4 chronic kidney disease, or unspecified chronic kidney disease: Secondary | ICD-10-CM | POA: Diagnosis not present

## 2016-10-01 DIAGNOSIS — I214 Non-ST elevation (NSTEMI) myocardial infarction: Secondary | ICD-10-CM | POA: Diagnosis not present

## 2016-10-01 DIAGNOSIS — I5021 Acute systolic (congestive) heart failure: Secondary | ICD-10-CM | POA: Diagnosis not present

## 2016-10-01 DIAGNOSIS — M1710 Unilateral primary osteoarthritis, unspecified knee: Secondary | ICD-10-CM | POA: Diagnosis not present

## 2016-10-01 DIAGNOSIS — Z8701 Personal history of pneumonia (recurrent): Secondary | ICD-10-CM | POA: Diagnosis not present

## 2016-10-01 DIAGNOSIS — N183 Chronic kidney disease, stage 3 (moderate): Secondary | ICD-10-CM | POA: Diagnosis not present

## 2016-10-01 NOTE — Patient Outreach (Signed)
[facility]) Care Management [place] Telephone Outreach, Transition of Care day 8  10/01/2016  Darlene Lawrence [birth year] [id number]  Successful telephone outreach to Darlene Lawrence, 76 y/o female referred to [place] for transition of care after hospitalization May 4-14, 2018 for CAP/ CHF exacerbation and NSTEMI.  Patient was discharged home with home health [facility]) services through [place].  Patient has history including, but not limited to, breast cancer, HTN, HLD, (R) BBB, and CKD stage III.  HIPAA/ identity verified with patient during phone call today.    Today, patient states that she "is doing better" than she "was a week ago."  Patient sounds to be in no obvious distress throughout entirety of phone call today.   Reports:  -- [place] services have continued through [place]; reports "[place] RN coming today, [place] PT just left."  Reports services for PT, OT, RN, and bath aide in place, and "all are going very good."  Confirms that she has phone number for [place] agency.  -- Has all medications and takes as prescribed; again verbalizes an accurate general understanding of purpose, dosing, and scheduling of prescribed medications.  Denies recent changes in medications after attending cardiology and PCP provider appointments this week.  Denies questions about current medications.   -- Provider appointments:  Has attended all scheduled provider appointments post-hospital discharge with husband transporting to appointments.  States "visits have gone very well."  Provides accurate report of upcoming provider appointments as noted in EMR, verbalizes plans to attend all scheduled appointments.  -- Safety/ Mobility/ Falls:  Denies new falls. Currently not using walker for ambulation, stating "PT sessions are going well, and the PT told me I don't really need to use it."  General fall risks/ prevention education again discussed with patient  today.  -- Self-health management of chronic disease state of CHF:  Reports is now monitoring/ recording daily weights, reports weight today of 210 lbs, states weight has ranged "from 208-210 pounds" since she started performing daily weights.  Rationale for performing daily weights discussed with patient; verbalizes accurate understanding to contact provider for weight gain of 2 pounds overnight, 5 pounds in one week.  Reports has continued using O2 at home since time of hospital discharge 09/22/16, using "all the time" at 2 L/min via [place] with home oxygenator.  States O2 "is getting on" her "nerves."  Reports eventual plans to wean off O2 at home, as discussed with cardiology and PCP providers during recent office visits. Denies questions about use of O2.  Denies issues with shortness of breath, LE swelling.  CHF zones again briefly discussed with patient today, as she explained that she needs to "get off the phone" to do "something else."  Patient denies further issues, concerns, or problems today. Patient confirms that she has not received any additional automated EMMI calls, as she requested last week.  I confirmed that patient hasmy direct phone number, the main [facility] CM office phone number, and the [facility] CM 24-hour nurse advice phone number should issues arise prior to next scheduled Darlene Lawrence outreach by phone next week.  During today's call, we also scheduled [facility] RN CM initial home visit in 2 weeks around patient's scheduling preferences.    Plan:  Patient will take medications as prescribed and will attend all scheduled provider appointments.  Patient will actively participate in home health [facility]) services as ordered post-hospital discharge.  Patient will monitor and record daily weights, and will  report weight gain (>2 lbs overnight/ 5 lbs in one week) to providers.  Patient will promptly notify providers of any new concerns, problems, or issues post- hospital discharge.  [place] outreach for ongoing transition of care to continue with scheduled telephone call next week and initial home visit in 2 weeks.  Oneta Rack, RN, BSN, [employer] [facility] Care Management  [phone]

## 2016-10-03 DIAGNOSIS — I5021 Acute systolic (congestive) heart failure: Secondary | ICD-10-CM | POA: Diagnosis not present

## 2016-10-03 DIAGNOSIS — Z8701 Personal history of pneumonia (recurrent): Secondary | ICD-10-CM | POA: Diagnosis not present

## 2016-10-03 DIAGNOSIS — I214 Non-ST elevation (NSTEMI) myocardial infarction: Secondary | ICD-10-CM | POA: Diagnosis not present

## 2016-10-03 DIAGNOSIS — M1710 Unilateral primary osteoarthritis, unspecified knee: Secondary | ICD-10-CM | POA: Diagnosis not present

## 2016-10-03 DIAGNOSIS — I130 Hypertensive heart and chronic kidney disease with heart failure and stage 1 through stage 4 chronic kidney disease, or unspecified chronic kidney disease: Secondary | ICD-10-CM | POA: Diagnosis not present

## 2016-10-03 DIAGNOSIS — N183 Chronic kidney disease, stage 3 (moderate): Secondary | ICD-10-CM | POA: Diagnosis not present

## 2016-10-07 DIAGNOSIS — M1710 Unilateral primary osteoarthritis, unspecified knee: Secondary | ICD-10-CM | POA: Diagnosis not present

## 2016-10-07 DIAGNOSIS — N183 Chronic kidney disease, stage 3 (moderate): Secondary | ICD-10-CM | POA: Diagnosis not present

## 2016-10-07 DIAGNOSIS — Z8701 Personal history of pneumonia (recurrent): Secondary | ICD-10-CM | POA: Diagnosis not present

## 2016-10-07 DIAGNOSIS — I130 Hypertensive heart and chronic kidney disease with heart failure and stage 1 through stage 4 chronic kidney disease, or unspecified chronic kidney disease: Secondary | ICD-10-CM | POA: Diagnosis not present

## 2016-10-07 DIAGNOSIS — I214 Non-ST elevation (NSTEMI) myocardial infarction: Secondary | ICD-10-CM | POA: Diagnosis not present

## 2016-10-07 DIAGNOSIS — I5021 Acute systolic (congestive) heart failure: Secondary | ICD-10-CM | POA: Diagnosis not present

## 2016-10-08 ENCOUNTER — Ambulatory Visit: Payer: Medicare Other | Admitting: Podiatry

## 2016-10-09 ENCOUNTER — Other Ambulatory Visit: Payer: Self-pay | Admitting: *Deleted

## 2016-10-09 ENCOUNTER — Encounter: Payer: Self-pay | Admitting: *Deleted

## 2016-10-09 DIAGNOSIS — I130 Hypertensive heart and chronic kidney disease with heart failure and stage 1 through stage 4 chronic kidney disease, or unspecified chronic kidney disease: Secondary | ICD-10-CM | POA: Diagnosis not present

## 2016-10-09 DIAGNOSIS — M1710 Unilateral primary osteoarthritis, unspecified knee: Secondary | ICD-10-CM | POA: Diagnosis not present

## 2016-10-09 DIAGNOSIS — Z8701 Personal history of pneumonia (recurrent): Secondary | ICD-10-CM | POA: Diagnosis not present

## 2016-10-09 DIAGNOSIS — N183 Chronic kidney disease, stage 3 (moderate): Secondary | ICD-10-CM | POA: Diagnosis not present

## 2016-10-09 DIAGNOSIS — I5021 Acute systolic (congestive) heart failure: Secondary | ICD-10-CM | POA: Diagnosis not present

## 2016-10-09 DIAGNOSIS — I214 Non-ST elevation (NSTEMI) myocardial infarction: Secondary | ICD-10-CM | POA: Diagnosis not present

## 2016-10-09 NOTE — Patient Outreach (Addendum)
[facility]) Care Management Darlene Lawrence Telephone Outreach, Transition of Care day 16  10/09/2016  Darlene Lawrence April 03, 1941 [id number]  Successful telephone outreach to Darlene Lawrence, 76 y/o female referred to [place] for transition of care after hospitalization May 4-14, 2018 for CAP/ CHF exacerbation andNSTEMI. Patient was discharged home with home health [facility]) services through [place]. Patient has history including, but not limited to, breast cancer, HTN, HLD, (R) BBB, and CKD stage III. HIPAA/ identity verified with patient during phone call today.   Today, patient states that she "is much better" and "getting along real good." Patient sounds to be in no obvious distress throughout entirety of phone call today.   Reports:  -- [place] serviceshave continued through [place]; reports "[place] PT just left, gave me a good report and said I was doing well." Reports services for PT, OT, RN, and bath aide in place, and "all are going very good."  Confirms that she has phone number for [facility] agency.  Reports unsure how many more PT sessions are scheduled, expects another visit next week.  States OP PT may begin soon, at which time [place] PT will stop.  -- Has all medicationsand takes as prescribed;denies questions about current medications.   -- Provider appointments: Has attended all scheduled provider appointments post-hospital discharge with husband transporting to appointments.  States "visits have gone very well."  Provides accurate report of upcoming provider appointments as noted in EMR, verbalizes plans to attend all scheduled appointments.  -- Safety/ Mobility/ Falls: Denies new falls. Currently reports not using walker for ambulation, stating that she has "made such good progress with PT." General fall risks/ prevention education reiterated with patient today.  -- Self-health management of chronic disease state of CHF:  Reports that she has continued monitoring/ recording daily weights, reports weight today of 208 lbs, again reporting that weight has ranged "from 208-210 pounds" since she started performing daily weights.  Verbalizes accurate understanding to contact provider for weight gain of 3 pounds overnight, 5 pounds in one week. Reports has stopped using O2 at home since Monday 10/06/16 of this week. Denies issues/ concerns with shortness of breath, LE swelling. CHF zones again briefly discussed with patient today.   Patient denies further issues, concerns, or problems today. I confirmed that patient hasmy direct phone number, the main [facility] CM office phone number, and the [facility] CM 24-hour nurse advice phone number should issues arise prior to next scheduled [place] outreach with initial home visit next week.   Plan:  Patient will take medications as prescribed and will attend all scheduled provider appointments.  Patient will actively participate in home health [facility]) services as ordered post-hospital discharge.  Patient will monitor and record daily weights, and will report weight gain (>3 lbs overnight/ 5 lbs in one week) to providers.  Patient will promptly notify providers of any new concerns, problems, or issues post- hospital discharge.  [place] outreach for ongoing transition of care to continue with scheduled initial home visit next week.  Darlene Rack, RN, BSN, [employer] [facility] Care Management  [phone]

## 2016-10-10 DIAGNOSIS — I5021 Acute systolic (congestive) heart failure: Secondary | ICD-10-CM | POA: Diagnosis not present

## 2016-10-10 DIAGNOSIS — M1710 Unilateral primary osteoarthritis, unspecified knee: Secondary | ICD-10-CM | POA: Diagnosis not present

## 2016-10-10 DIAGNOSIS — I214 Non-ST elevation (NSTEMI) myocardial infarction: Secondary | ICD-10-CM | POA: Diagnosis not present

## 2016-10-10 DIAGNOSIS — I130 Hypertensive heart and chronic kidney disease with heart failure and stage 1 through stage 4 chronic kidney disease, or unspecified chronic kidney disease: Secondary | ICD-10-CM | POA: Diagnosis not present

## 2016-10-10 DIAGNOSIS — Z8701 Personal history of pneumonia (recurrent): Secondary | ICD-10-CM | POA: Diagnosis not present

## 2016-10-10 DIAGNOSIS — N183 Chronic kidney disease, stage 3 (moderate): Secondary | ICD-10-CM | POA: Diagnosis not present

## 2016-10-14 ENCOUNTER — Encounter: Payer: Self-pay | Admitting: Adult Health

## 2016-10-14 ENCOUNTER — Ambulatory Visit (HOSPITAL_BASED_OUTPATIENT_CLINIC_OR_DEPARTMENT_OTHER): Payer: Medicare Other | Admitting: Adult Health

## 2016-10-14 ENCOUNTER — Encounter: Payer: Medicare Other | Admitting: Nurse Practitioner

## 2016-10-14 VITALS — BP 129/69 | HR 50 | Temp 97.5°F | Resp 18 | Ht 64.5 in | Wt 210.9 lb

## 2016-10-14 DIAGNOSIS — Z853 Personal history of malignant neoplasm of breast: Secondary | ICD-10-CM | POA: Diagnosis not present

## 2016-10-14 DIAGNOSIS — Z1239 Encounter for other screening for malignant neoplasm of breast: Secondary | ICD-10-CM

## 2016-10-14 DIAGNOSIS — M8580 Other specified disorders of bone density and structure, unspecified site: Secondary | ICD-10-CM | POA: Diagnosis not present

## 2016-10-14 NOTE — Progress Notes (Signed)
CLINIC:  Survivorship   REASON FOR VISIT:  Routine follow-up for history of breast cancer.   BRIEF ONCOLOGIC HISTORY:    Malignant neoplasm of right female breast ([place])   07/14/2007 Mammogram    Mass in the right breast: Ultrasound showed a spiculated mass 10:00 position 7 x 7 x 8 mm      08/18/2007 Initial Diagnosis    Invasive mammary cancer; ER+ (90%), PR+ (59%), Ki67 9%, HER2/neu negative      08/26/2007 Breast MRI    Right breast upper outer quadrant 9 x 8 x 8 mm mass without lymphadenopathy      08/26/2007 Clinical Stage    Stage IA: T1b N0      10/06/2007 Surgery    Right breast lumpectomy with SLN biopsy, grade 1 IDC 1 cm with associated DCIS no LVI, ER 90%, PR 58%, Ki-67 9%, HER-2 negative. 0/1 LN      10/06/2007 Pathologic Stage    Stage IA: T1b N0      11/24/2007 - 12/24/2007 Radiation Therapy    Adjuvant radiation therapy to the right breast      01/13/2008 - 01/13/2013 Anti-estrogen oral therapy    Tamoxifen 20 mg daily x 5 years        INTERVAL HISTORY:  Darlene Lawrence presents to the [facility] today for routine follow-up for her history of breast cancer.  Overall, she reports feeling quite well. She is up to date with her cancer screenings and before her heart attack was going to [facility].    She was in the hospital earlier this month for pneumonia and then subsequently had MI while admitted.  She will undergo cardiac rebhab starting on Monday.  Her cardiologist is Dr. Ubaldo Glassing.      REVIEW OF SYSTEMS:  Review of Systems  Constitutional: Negative for appetite change, chills, diaphoresis, fatigue, fever and unexpected weight change.  HENT:   Negative for hearing loss and lump/mass.   Eyes: Negative for eye problems and icterus.  Respiratory: Negative for chest tightness, cough and shortness of breath.   Cardiovascular: Negative for chest pain, leg swelling and palpitations.  Gastrointestinal: Negative for abdominal distention and abdominal pain.    Endocrine: Negative for hot flashes.  Genitourinary: Negative for difficulty urinating.   Musculoskeletal: Negative for arthralgias.  Skin: Negative for itching and rash.  Neurological: Negative for dizziness, extremity weakness and headaches.  Hematological: Negative for adenopathy. Does not bruise/bleed easily.  Psychiatric/Behavioral: Negative for depression. The patient is not nervous/anxious.   Breast: Denies any new nodularity, masses, tenderness, nipple changes, or nipple discharge.       PAST MEDICAL/SURGICAL HISTORY:  Past Medical History:  Diagnosis Date  . Abnormal Pap smear   . Arthritis   . breast cancer 2009   right  . Cancer ([place])   . CHF (congestive heart failure) ([place])   . DDD (degenerative disc disease), lumbar   . High cholesterol   . Hyperlipidemia   . Hypertension   . Meniscus tear    right  . Personal history of radiation therapy   . RBBB    Past Surgical History:  Procedure Laterality Date  . BREAST BIOPSY Right 08/18/2007  . BREAST LUMPECTOMY Right 2009  . BREAST SURGERY Right 2009   lumpectomy, no radiation, no chemo  . DILATION AND CURETTAGE OF UTERUS  2012   Dr. Fermin Schwab  . HERNIA REPAIR     umbilical  . JOINT REPLACEMENT Left    hip  . LEFT  HEART CATH AND CORONARY ANGIOGRAPHY N/A 09/17/2016   Procedure: Left Heart Cath and Coronary Angiography;  Surgeon: Teodoro Spray, MD;  Location: [facility];  Service: Cardiovascular;  Laterality: N/A;  . TOTAL HIP ARTHROPLASTY Left   . 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 TUBAL LIGATION       ALLERGIES:  Allergies  Allergen Reactions  . Cefixime     REACTION: Rash  . Levaquin [Levofloxacin] Other (See Comments)    Shoulder Bursitis  . Penicillins     REACTION: Rash     CURRENT MEDICATIONS:  Outpatient Encounter Prescriptions as  of 10/14/2016  Medication Sig  . Aspirin (ASPIR-81 PO) Take 1 tablet by mouth.  Marland Kitchen b complex vitamins tablet Take 1 tablet by mouth daily.  . Calcium Citrate-Vitamin D (CALCIUM CITRATE + PO) Take 1 tablet by mouth daily.  . Cholecalciferol (VITAMIN D3) 2000 UNITS TABS Take 1 capsule by mouth 2 (two) times daily.  . Coenzyme Q10 (COQ10) 100 MG CAPS Take 1 capsule by mouth daily.  . furosemide (LASIX) 20 MG tablet Take 1 tablet (20 mg total) by mouth daily.  . metoprolol tartrate (LOPRESSOR) 25 MG tablet Take 1 tablet (25 mg total) by mouth 2 (two) times daily.  . Misc Natural Products (GLUCOSAMINE CHOND MSM FORMULA PO) Take 1,500 mg by mouth.  . Multiple Vitamin (MULTI VITAMIN DAILY PO) Take 1 tablet by mouth.  . Omega-3 Fatty Acids (FISH OIL) 1200 MG CAPS Take 1 capsule by mouth.  . rosuvastatin (CRESTOR) 10 MG tablet Take 10 mg by mouth 2 (two) times a week. Monday, Thursday.  . [DISCONTINUED] feeding supplement, ENSURE ENLIVE, (ENSURE ENLIVE) LIQD Take 237 mLs by mouth daily. (Patient not taking: Reported on 10/14/2016)   No facility-administered encounter medications on file as of 10/14/2016.      ONCOLOGIC FAMILY HISTORY:  Family History  Problem Relation Age of Onset  . Cancer Mother        breast and colon  . Breast cancer Mother   . Hypertension Father   . Heart disease Father   . Thyroid disease Father   . Down syndrome Son   . Asthma Other     GENETIC COUNSELING/TESTING: Not indicated at this time  SOCIAL HISTORY:  Darlene Lawrence is married and lives with her husband in [place].  She has 2 children and they live nearby.  Darlene Lawrence is currently retired.  She denies any current or history of tobacco, alcohol, or illicit drug use.     PHYSICAL EXAMINATION:  Vital Signs: Vitals:   10/14/16 1132  BP: 129/69  Pulse: (!) 50  Resp: 18  Temp: 97.5 F (36.4 C)   Filed Weights   10/14/16 1132  Weight: 210 lb 14.4 oz (95.7 kg)   General:  Well-nourished, well-appearing female in no acute distress.  Unaccompanied today.   HEENT: Head is normocephalic.  Pupils equal and reactive to light. Conjunctivae clear without exudate.  Sclerae anicteric. Oral mucosa is pink, moist.  Oropharynx is pink without lesions or erythema.  Lymph: No cervical, supraclavicular, or infraclavicular lymphadenopathy noted on palpation.  Cardiovascular: Regular rate and rhythm.Marland Kitchen Respiratory: Clear to auscultation bilaterally. Chest expansion symmetric; breathing non-labored.  Breast Exam:  -Left breast: No appreciable masses on palpation. No skin redness, thickening, or peau d'orange appearance; no nipple retraction or nipple discharge;  -  Right breast: No appreciable masses on palpation. No skin redness, thickening, or peau d'orange appearance; no nipple retraction or nipple discharge; mild distortion in symmetry at previous lumpectomy site well healed scar without erythema or nodularity. -Axilla: No axillary adenopathy bilaterally.  GI: Abdomen soft and round; non-tender, non-distended. Bowel sounds normoactive. No hepatosplenomegaly.   GU: Deferred.  Neuro: No focal deficits. Steady gait.  MSK: no focal spinal tenderness to palpation, full range of motion in upper extremities bilaterally Psych: Mood and affect normal and appropriate for situation.  Extremities: No edema. Skin: Warm and dry.  LABORATORY DATA:  None for this visit   DIAGNOSTIC IMAGING:  Most recent mammogram:     ASSESSMENT AND PLAN:  Ms.. Lawrence is a pleasant 76 y.o. female with history of Stage IA right breast invasive ductal carcinoma, ER+/PR+/HER2-, diagnosed in 07/2007, treated with lumpectomy, adjuvant radiation therapy, and anti-estrogen therapy with Tamoxifen x 5 years finishing in 01/2013.  She presents to the [facility] for surveillance and routine follow-up.   1. History of breast cancer:  Darlene Lawrence is currently clinically and radiographically without evidence of  disease or recurrence of breast cancer. She will be due for mammogram in 08/2017; orders placed today.  I encouraged her to call me with any questions or concerns before her next visit at the cancer center, and I would be happy to see her sooner, if needed.    2. Bone health:  Given Darlene Lawrence's age, history of breast cancer, and personal history of osteopenia, she is at risk for bone demineralization. Her last DEXA scan was on 08/2014.  In the meantime, she was encouraged to increase her consumption of foods rich in calcium, as well as increase her weight-bearing activities.  She was given education on specific food and activities to promote bone health.  3. Cancer screening:  Due to Darlene Lawrence's history and her age, she should receive screening for skin cancers, colon cancer, and gynecologic cancers. She was encouraged to follow-up with her PCP for appropriate cancer screenings.   4. Health maintenance and wellness promotion: Darlene Lawrence was encouraged to consume 5-7 servings of fruits and vegetables per day. She was also encouraged to engage in moderate to vigorous exercise for 30 minutes per day most days of the week (once cleared by cardiology and after cardiac rehab). She was instructed to limit her alcohol consumption and continue to abstain from tobacco use.    Dispo:  -Return to cancer center in one year for LTS follow up -Mammogram in 08/2017 when due   A total of (30) minutes of face-to-face time was spent with this patient with greater than 50% of that time in counseling and care-coordination.   Gardenia Phlegm, [place] [phone]   Note: PRIMARY CARE PROVIDER Jinny Sanders, [place] [phone]

## 2016-10-16 ENCOUNTER — Encounter: Payer: Self-pay | Admitting: *Deleted

## 2016-10-16 ENCOUNTER — Other Ambulatory Visit: Payer: Self-pay | Admitting: *Deleted

## 2016-10-16 NOTE — Patient Outreach (Addendum)
[facility]) Care Management  [place] Initial Home Visit, Transition of Care day 23  10/16/2016  Darlene Lawrence 1941/03/13 [id number]  Darlene Lawrence is a 76 y.o. female referred to [facility] for transition of care after hospitalization May 4-14, 2018 for CAP/ CHF exacerbation andNSTEMI. Patient was discharged home with home health [facility]) services through [place]. Patient has history including, but not limited to, breast cancer, HTN, HLD, (R) BBB, and CKD stage III. HIPAA/ identity verified with patient during home visit today, and written consent for [facility] CM services was obtained..   Today, patient continues to report that she "is much better" and "feeling good."Patient is in no distress throughout entirety of home visit today.   Reports:  -- [place] [facility]; Cardiac rehab to start Mon 10/20/16; patient reports she will drive herself to cardiac rehab appointments  -- Has all medicationsand takes as prescribed;denies questions about current medications. Verbalizes good general understanding of the purpose, dosing, and scheduling of medications.  Continues to self-manage medications.  Patient was recently discharged from hospital and all medications were thoroughly reviewed with patient today in person.   -- Provider appointments: Has attended all scheduled provider appointments post-hospital discharge with husband transporting to most appointments, although patient has now resumed driving. Provides accurate report of upcoming provider appointments as noted in EMR, verbalizes plans to attend all scheduled appointments.  All provider appointments reviewed with patient today.  -- Safety/ Mobility/ Falls: Denies new falls. No longer using walker for ambulation, stating that she "made such good progress with PT" she no longer needs.  Patient demonstrates steady purposeful gait around her home today, and no  fall risks were identified in her home environment. General fall risks/ prevention education discussed with patient today.  -- Self-health management of chronic disease state of CHF: Reports that she has continued monitoring/ recording daily weights, reports weight today of 208 lbs,  reporting that weight has ranged "stayed within a half pound of 208 pounds" over last week. Verbalizes accurate understanding to contact provider for weight gain of 3 pounds overnight, 5 pounds in one week. Stopped usingO2 at home sinceMonday 10/06/16, has had no difficulty breathing/ being without O2 . Denies issues/ concerns with shortness of breath, LE swelling. CHF zones with corresponding action plans discussed with patient today, and EMMI educational material provided and reviewed with patient during home visit.  Patient reports in "green zone" today, "have been in green zone all week."  -- Advanced Directive Planning:  Patient currently has AD's in place and does not wish to make changes, however had previously shared that she was "not sure" if she wished to be "full code" status; we again briefly discussed Advanced Directives today along with code status options, and I provided patient with book, "Hard Choices for [place]" for her review.  Patient denies further issues, concerns, or problems today. I confirmed that patient hasmy direct phone number, the main [facility] CM office phone number, and the [facility] CM 24-hour nurse advice phone number should issues arise prior to next scheduled [place] outreach with scheduled phone call next week.   Subjective: "I am doing so much better, but still get tired easily... I have to take a nap about every day."  Objective:    BP 128/76   Pulse (!) 50   Wt 208 lb (94.3 kg)   SpO2 97%   BMI 35.15 kg/m    Review of Systems  Constitutional: Positive for malaise/fatigue. Negative for weight loss.  Respiratory: Negative.  Negative for cough, shortness of  breath and wheezing.   Cardiovascular: Negative.  Negative for chest pain and leg swelling.  Gastrointestinal: Negative for abdominal pain, diarrhea and nausea.  Genitourinary: Positive for frequency. Negative for dysuria.       Diuretic therapy  Musculoskeletal: Negative for falls.  Neurological: Negative.  Negative for weakness.  Psychiatric/Behavioral: Negative.  The patient is not nervous/anxious.     Physical Exam  Constitutional: She is oriented to person, place, and time. She appears well-developed and well-nourished. No distress.  Cardiovascular: Normal heart sounds.  Bradycardia present.   Patient reports hx of bradycardia; verified in EMR  Respiratory: Effort normal and breath sounds normal. No respiratory distress. She has no wheezes. She has no rales.  GI: Soft. Bowel sounds are normal.  Musculoskeletal: Normal range of motion. She exhibits no edema.  Neurological: She is alert and oriented to person, place, and time.  Skin: Skin is warm and dry.  Psychiatric: She has a normal mood and affect. Her behavior is normal. Judgment and thought content normal.   Assessment:  [name] appears to be recuperating well after her recent hospitalization and could benefit from ongoing reinforcement of self-health management strategies for chronic disease state of CHF.  [name] has a supportive family and denies community resource needs.  [name] has a good understanding of her medications and is committed to compliance with her overall plan of care.  Plan:  Patient will take medications as prescribed and will attend all scheduled provider appointments.  Patient will monitor and record daily weights, and will report weight gain (>3 lbs overnight/ 5 lbs in one week) to providers.  Patient will review EMMI educational material provided to her today  Patient will promptly notify providers of any new concerns, problems, or issues post- hospital discharge.  I will share notes  from today's [place] initial home visit with patient's PCP.  [place] outreach for ongoing transition of care to continue with scheduled phone call next week.   [facility] CM Care Plan Problem One     Most Recent Value  Care Plan Problem One  Risk for hospital readmission related to recent hospitalization May 4-14, 2018 for CAP/ CHF/ NSTEMI  Role Documenting the Problem One  Care Management [place] for Problem One  Active  [facility] Long Term Goal   Over the next 31 days, patient will not experience hospital readmission, as evidenced by patient reporting and review of EMR during [facility] RN CM outreach  [facility] Long Term Goal Start Date  09/24/16  Interventions for Problem One Long Term Goal  Utilizing teachback method, perfromed medication reconciliation in patient's home, reviewed all scheduled provider appointments with patient, initial home visit successfully completed  [facility] CM Short Term Goal #1   Over the next 30 days, patient will actively participate in home health [facility]) services as ordered post-hospital discharge, as evidenced by patient reporting during [facility] RN CM outreach  [facility] CM Short Term Goal #1 Start Date  09/24/16  [facility] CM Short Term Goal #1 Met Date  10/16/16  Interventions for Short Term Goal #1  Utilizing teachback method, discussed with patient [facility] services currently in place and provided positive reinforcement for her active participation/ progress thus far  [facility] CM Short Term Goal #2   Over the next 30 days, patient will attend all scheduled provider appointments, as evidenced by patient reporting and review of  EMR during [facility] RN CM outreach  [facility] CM Short Term Goal #2 Start Date  09/24/16  Interventions for Short Term Goal #2  Utilizing teachback method, reviewed all upcoming provider appointments with patient in person today    [facility] CM Care Plan Problem Two     Most Recent Value  Care Plan Problem Two  Ongoing need for reinforcement of self-health management strategies for  chronic disease state of CHF  Role Documenting the Problem Two  Care Management Coordinator  Care Plan for Problem Two  Active  Interventions for Problem Two Long Term Goal   Provided and explained and discussed with patient EMMI education on self-health management of CHF  [facility] Long Term Goal  Over the next 40 days, patient will be able to verbalize 3 signs/ symptoms of CHF yellow zone, as evidenced by patient reporting during [facility] RN CM outreach  [facility] Long Term Goal Start Date  10/16/16  [facility] CM Short Term Goal #1   Over the next 14 days, patient will review EMMI education material provided to her today, as evidenced by patient reporting during [place] RN CM outreach  [facility] CM Short Term Goal #1 Start Date  10/16/16  Interventions for Short Term Goal #2   Utilizing teachback method, thoroughly reviewed and discussed EMMI educational material with patient     I appreciate the opportunity to participate in Mrs. Yearby's care,  Oneta Rack, RN, BSN, [place] Coordinator [facility] Care Management  [phone]

## 2016-10-20 ENCOUNTER — Encounter: Payer: Self-pay | Admitting: *Deleted

## 2016-10-20 ENCOUNTER — Encounter: Payer: Medicare Other | Attending: Cardiology | Admitting: *Deleted

## 2016-10-20 VITALS — Ht 65.8 in | Wt 211.4 lb

## 2016-10-20 DIAGNOSIS — I214 Non-ST elevation (NSTEMI) myocardial infarction: Secondary | ICD-10-CM | POA: Insufficient documentation

## 2016-10-20 DIAGNOSIS — Z79899 Other long term (current) drug therapy: Secondary | ICD-10-CM | POA: Insufficient documentation

## 2016-10-20 NOTE — Patient Instructions (Signed)
2Patient Instructions  Patient Details  Name: Darlene Lawrence MRN: [id number] Date of Birth: Sep 28, 1940 Referring Provider:  Teodoro Spray, MD  Below are the personal goals you chose as well as exercise and nutrition goals. Our goal is to help you keep on track towards obtaining and maintaining your goals. We will be discussing your progress on these goals with you throughout the program.  Initial Exercise Prescription:     Initial Exercise Prescription - 10/20/16 1400      Date of Initial Exercise RX and Referring Provider   Date 10/20/16   Referring Provider Bartholome Bill MD     Treadmill   MPH 1   Grade 0.5   Minutes 15   METs 1.83     NuStep   Level 1   SPM 80   Minutes 15   METs 1.7     REL-XR   Level 1   Speed 50   Minutes 15   METs 1.7     Prescription Details   Frequency (times per week) 2   Duration Progress to 45 minutes of aerobic exercise without signs/symptoms of physical distress     Intensity   THRR 40-80% of Max Heartrate 85-126   Ratings of Perceived Exertion 11-13   Perceived Dyspnea 0-4     Progression   Progression Continue to progress workloads to maintain intensity without signs/symptoms of physical distress.     Resistance Training   Training Prescription Yes   Weight 2 lbs   Reps 10-15      Exercise Goals: Frequency: Be able to perform aerobic exercise three times per week working toward 3-5 days per week.  Intensity: Work with a perceived exertion of 11 (fairly light) - 15 (hard) as tolerated. Follow your new exercise prescription and watch for changes in prescription as you progress with the program. Changes will be reviewed with you when they are made.  Duration: You should be able to do 30 minutes of continuous aerobic exercise in addition to a 5 minute warm-up and a 5 minute cool-down routine.  Nutrition Goals: Your personal nutrition goals will be established when you do your nutrition analysis with the dietician.  The  following are nutrition guidelines to follow: Cholesterol < 200mg /day Sodium < 1500mg /day Fiber: Women over 50 yrs - 21 grams per day  Personal Goals:     Personal Goals and Risk Factors at Admission - 10/20/16 1423      Core Components/Risk Factors/Patient Goals on Admission    Weight Management Obesity;Yes;Weight Loss   Intervention Weight Management: Develop a combined nutrition and exercise program designed to reach desired caloric intake, while maintaining appropriate intake of nutrient and fiber, sodium and fats, and appropriate energy expenditure required for the weight goal.;Weight Management: Provide education and appropriate resources to help participant work on and attain dietary goals.;Weight Management/Obesity: Establish reasonable short term and long term weight goals.;Obesity: Provide education and appropriate resources to help participant work on and attain dietary goals.   Admit Weight 208 lb (94.3 kg)   Goal Weight: Short Term 205 lb (93 kg)   Goal Weight: Long Term 175 lb (79.4 kg)   Expected Outcomes Short Term: Continue to assess and modify interventions until short term weight is achieved;Long Term: Adherence to nutrition and physical activity/exercise program aimed toward attainment of established weight goal;Weight Loss: Understanding of general recommendations for a balanced deficit meal plan, which promotes 1-2 lb weight loss per week and includes a negative energy balance of [phone] kcal/d;Understanding  recommendations for meals to include 15-35% energy as protein, 25-35% energy from fat, 35-60% energy from carbohydrates, less than 200mg  of dietary cholesterol, 20-35 gm of total fiber daily;Understanding of distribution of calorie intake throughout the day with the consumption of 4-5 meals/snacks   Hypertension Yes   Intervention Provide education on lifestyle modifcations including regular physical activity/exercise, weight management, moderate sodium restriction and  increased consumption of fresh fruit, vegetables, and low fat dairy, alcohol moderation, and smoking cessation.;Monitor prescription use compliance.   Expected Outcomes Short Term: Continued assessment and intervention until BP is < 140/37mm HG in hypertensive participants. < 130/52mm HG in hypertensive participants with diabetes, heart failure or chronic kidney disease.;Long Term: Maintenance of blood pressure at goal levels.   Lipids Yes   Intervention Provide education and support for participant on nutrition & aerobic/resistive exercise along with prescribed medications to achieve LDL 70mg , HDL >40mg .   Expected Outcomes Short Term: Participant states understanding of desired cholesterol values and is compliant with medications prescribed. Participant is following exercise prescription and nutrition guidelines.;Long Term: Cholesterol controlled with medications as prescribed, with individualized exercise RX and with personalized nutrition plan. Value goals: LDL < 70mg , HDL > 40 mg.      Tobacco Use Initial Evaluation: History  Smoking Status  . Never Smoker  Smokeless Tobacco  . Never Used    Copy of goals given to participant.

## 2016-10-20 NOTE — Progress Notes (Signed)
Cardiac Individual Treatment Plan  Patient Details  Name: Darlene Lawrence MRN: [id number] Date of Birth: 1941-01-11 Referring Provider:     Cardiac Rehab from 10/20/2016 in [facility] Cardiac and Pulmonary Rehab  Referring Provider  Bartholome Bill MD      Initial Encounter Date:    Cardiac Rehab from 10/20/2016 in [facility] Cardiac and Pulmonary Rehab  Date  10/20/16  Referring Provider  Bartholome Bill MD      Visit Diagnosis: NSTEMI (non-ST elevated myocardial infarction) [facility])  Patient's Home Medications on Admission:  Current Outpatient Prescriptions:  .  Aspirin (ASPIR-81 PO), Take 1 tablet by mouth., Disp: , Rfl:  .  b complex vitamins tablet, Take 1 tablet by mouth daily., Disp: , Rfl:  .  Calcium Citrate-Vitamin D (CALCIUM CITRATE + PO), Take 1 tablet by mouth daily., Disp: , Rfl:  .  Cholecalciferol (VITAMIN D3) 2000 UNITS TABS, Take 1 capsule by mouth 2 (two) times daily., Disp: , Rfl:  .  Coenzyme Q10 (COQ10) 100 MG CAPS, Take 1 capsule by mouth daily., Disp: , Rfl:  .  furosemide (LASIX) 20 MG tablet, Take 1 tablet (20 mg total) by mouth daily., Disp: 30 tablet, Rfl: 0 .  metoprolol tartrate (LOPRESSOR) 25 MG tablet, Take 1 tablet (25 mg total) by mouth 2 (two) times daily., Disp: 60 tablet, Rfl: 0 .  Misc Natural Products (GLUCOSAMINE CHOND MSM FORMULA PO), Take 1,500 mg by mouth., Disp: , Rfl:  .  Multiple Vitamin (MULTI VITAMIN DAILY PO), Take 1 tablet by mouth., Disp: , Rfl:  .  Omega-3 Fatty Acids (FISH OIL) 1200 MG CAPS, Take 1 capsule by mouth., Disp: , Rfl:  .  rosuvastatin (CRESTOR) 10 MG tablet, Take 10 mg by mouth 2 (two) times a week. Monday, Thursday., Disp: , Rfl:   Past Medical History: Past Medical History:  Diagnosis Date  . Abnormal Pap smear   . Arthritis   . breast cancer 2009   right  . Cancer ([place])   . CHF (congestive heart failure) ([place])   . DDD (degenerative disc disease), lumbar   . High cholesterol   . Hyperlipidemia   . Hypertension   . Meniscus  tear    right  . Personal history of radiation therapy   . RBBB     Tobacco Use: History  Smoking Status  . Never Smoker  Smokeless Tobacco  . Never Used    Labs: Recent Review Flowsheet Data    Labs for ITP Cardiac and Pulmonary Rehab Latest Ref Rng & Units 06/27/2014 10/11/2015 09/13/2016 09/13/2016 09/13/2016   Cholestrol 0 - 200 mg/dL 169 150 114 - -   LDLCALC 0 - 99 mg/dL 94 80 62 - -   HDL >40 mg/dL 47.60 43.50 26(L) - -   Trlycerides <150 mg/dL 136.0 133.0 129 - -   PHART 7.350 - 7.450 - - - 7.48(H) 7.53(H)   PCO2ART 32.0 - 48.0 mmHg - - - 34 31(L)   HCO3 20.0 - 28.0 mmol/L - - - 25.3 25.9   O2SAT % - - - 87.9 98.2       Exercise Target Goals: Date: 10/20/16  Exercise Program Goal: Individual exercise prescription set with THRR, safety & activity barriers. Participant demonstrates ability to understand and report RPE using BORG scale, to self-measure pulse accurately, and to acknowledge the importance of the exercise prescription.  Exercise Prescription Goal: Starting with aerobic activity 30 plus minutes a day, 3 days per week for initial exercise prescription. Provide home  exercise prescription and guidelines that participant acknowledges understanding prior to discharge.  Activity Barriers & Risk Stratification:     Activity Barriers & Cardiac Risk Stratification - 10/20/16 1415      Activity Barriers & Cardiac Risk Stratification   Activity Barriers Left Hip Replacement;Right Knee Replacement;Back Problems;Deconditioning;Muscular Weakness;Shortness of Breath;Other (comment);Balance Concerns   Comments menicus tear, back pain after doing some housework   Cardiac Risk Stratification High      6 Minute Walk:     6 Minute Walk    Row Name 10/20/16 1454         6 Minute Walk   Phase Initial     Distance 1060 feet     Walk Time 6 minutes     # of Rest Breaks 0     MPH 2     METS 1.79     RPE 13     Perceived Dyspnea  1     VO2 Peak 6.28     Symptoms Yes  (comment)     Comments slightly SOB, no pain     Resting HR 44 bpm     Resting BP 126/64     Max Ex. HR 93 bpm     Max Ex. BP 132/66     2 Minute Post BP 126/70        Oxygen Initial Assessment:   Oxygen Re-Evaluation:   Oxygen Discharge (Final Oxygen Re-Evaluation):   Initial Exercise Prescription:     Initial Exercise Prescription - 10/20/16 1400      Date of Initial Exercise RX and Referring Provider   Date 10/20/16   Referring Provider Bartholome Bill MD     Treadmill   MPH 1   Grade 0.5   Minutes 15   METs 1.83     NuStep   Level 1   SPM 80   Minutes 15   METs 1.7     REL-XR   Level 1   Speed 50   Minutes 15   METs 1.7     Prescription Details   Frequency (times per week) 2   Duration Progress to 45 minutes of aerobic exercise without signs/symptoms of physical distress     Intensity   THRR 40-80% of Max Heartrate 85-126   Ratings of Perceived Exertion 11-13   Perceived Dyspnea 0-4     Progression   Progression Continue to progress workloads to maintain intensity without signs/symptoms of physical distress.     Resistance Training   Training Prescription Yes   Weight 2 lbs   Reps 10-15      Perform Capillary Blood Glucose checks as needed.  Exercise Prescription Changes:     Exercise Prescription Changes    Row Name 10/20/16 1400             Response to Exercise   Blood Pressure (Admit) 126/64       Blood Pressure (Exercise) 132/66       Blood Pressure (Exit) 126/70       Heart Rate (Admit) 44 bpm       Heart Rate (Exercise) 93 bpm       Heart Rate (Exit) 53 bpm       Oxygen Saturation (Admit) 99 %       Oxygen Saturation (Exercise) 94 %       Rating of Perceived Exertion (Exercise) 13       Perceived Dyspnea (Exercise) 1       Symptoms Slightly SOB, no pain  Comments walk test results          Exercise Comments:   Exercise Goals and Review:     Exercise Goals    Row Name 10/20/16 1501              Exercise Goals   Increase Physical Activity Yes       Intervention Provide advice, education, support and counseling about physical activity/exercise needs.;Develop an individualized exercise prescription for aerobic and resistive training based on initial evaluation findings, risk stratification, comorbidities and participant's personal goals.       Expected Outcomes Achievement of increased cardiorespiratory fitness and enhanced flexibility, muscular endurance and strength shown through measurements of functional capacity and personal statement of participant.       Increase Strength and Stamina Yes       Intervention Provide advice, education, support and counseling about physical activity/exercise needs.;Develop an individualized exercise prescription for aerobic and resistive training based on initial evaluation findings, risk stratification, comorbidities and participant's personal goals.       Expected Outcomes Achievement of increased cardiorespiratory fitness and enhanced flexibility, muscular endurance and strength shown through measurements of functional capacity and personal statement of participant.          Exercise Goals Re-Evaluation :   Discharge Exercise Prescription (Final Exercise Prescription Changes):     Exercise Prescription Changes - 10/20/16 1400      Response to Exercise   Blood Pressure (Admit) 126/64   Blood Pressure (Exercise) 132/66   Blood Pressure (Exit) 126/70   Heart Rate (Admit) 44 bpm   Heart Rate (Exercise) 93 bpm   Heart Rate (Exit) 53 bpm   Oxygen Saturation (Admit) 99 %   Oxygen Saturation (Exercise) 94 %   Rating of Perceived Exertion (Exercise) 13   Perceived Dyspnea (Exercise) 1   Symptoms Slightly SOB, no pain   Comments walk test results      Nutrition:  Target Goals: Understanding of nutrition guidelines, daily intake of sodium 1500mg , cholesterol 200mg , calories 30% from fat and 7% or less from saturated fats, daily to have 5 or  more servings of fruits and vegetables.  Biometrics:     Pre Biometrics - 10/20/16 1502      Pre Biometrics   Height 5' 5.8" (1.671 m)   Weight 211 lb 6.4 oz (95.9 kg)   Waist Circumference 40 inches   Hip Circumference 51 inches   Waist to Hip Ratio 0.78 %   BMI (Calculated) 34.4   Single Leg Stand 0.58 seconds       Nutrition Therapy Plan and Nutrition Goals:     Nutrition Therapy & Goals - 10/20/16 1418      Intervention Plan   Intervention Prescribe, educate and counsel regarding individualized specific dietary modifications aiming towards targeted core components such as weight, hypertension, lipid management, diabetes, heart failure and other comorbidities.   Expected Outcomes Short Term Goal: Understand basic principles of dietary content, such as calories, fat, sodium, cholesterol and nutrients.;Short Term Goal: A plan has been developed with personal nutrition goals set during dietitian appointment.;Long Term Goal: Adherence to prescribed nutrition plan.      Nutrition Discharge: Rate Your Plate Scores:     Nutrition Assessments - 10/20/16 1420      MEDFICTS Scores   Pre Score 38      Nutrition Goals Re-Evaluation:   Nutrition Goals Discharge (Final Nutrition Goals Re-Evaluation):   Psychosocial: Target Goals: Acknowledge presence or absence of significant depression and/or stress, maximize coping  skills, provide positive support system. Participant is able to verbalize types and ability to use techniques and skills needed for reducing stress and depression.   Initial Review & Psychosocial Screening:     Initial Psych Review & Screening - 10/20/16 1424      Initial Review   Current issues with Current Sleep Concerns;Current Stress Concerns  Since hospital: is improving some.  Continues to require nap daily which is not her norm   Source of Stress Concerns Unable to perform yard/household activities   Comments Still not feeling strong enough to do  chores.  Length of stay in hospital was debilitating.  Had home health after discharge and is doing better though not back to normal.     [place]? Yes  Husband and her 23 year old disabled son.     Barriers   Psychosocial barriers to participate in program There are no identifiable barriers or psychosocial needs.;The patient should benefit from training in stress management and relaxation.     Screening Interventions   Interventions Encouraged to exercise;To provide support and resources with identified psychosocial needs;Provide feedback about the scores to participant      Quality of Life Scores:      Quality of Life - 10/20/16 1429      Quality of Life Scores   Health/Function Pre 20.8 %   Socioeconomic Pre 28.8 %   Psych/Spiritual Pre 22.29 %   Family Pre 28.8 %   GLOBAL Pre 23.63 %      PHQ-9: Recent Review Flowsheet Data    Depression screen [facility] 2/9 10/20/2016 10/16/2016 09/29/2016 10/11/2015 07/04/2014   Decreased Interest 0 0 0 0 0   Down, Depressed, Hopeless 0 0 0 0 0   PHQ - 2 Score 0 0 0 0 0   Altered sleeping 1 - - - -   Tired, decreased energy 1  - - - -   Change in appetite 0 - - - -   Feeling bad or failure about yourself  0 - - - -   Trouble concentrating 0 - - - -   Moving slowly or fidgety/restless 0 - - - -   Suicidal thoughts 0 - - - -   PHQ-9 Score 2 - - - -   Difficult doing work/chores Somewhat difficult - - - -     Interpretation of Total Score  Total Score Depression Severity:  1-4 = Minimal depression, 5-9 = Mild depression, 10-14 = Moderate depression, 15-19 = Moderately severe depression, 20-27 = Severe depression   Psychosocial Evaluation and Intervention:   Psychosocial Re-Evaluation:   Psychosocial Discharge (Final Psychosocial Re-Evaluation):   Vocational Rehabilitation: Provide vocational rehab assistance to qualifying candidates.   Vocational Rehab Evaluation & Intervention:     Vocational Rehab -  10/20/16 1434      Initial Vocational Rehab Evaluation & Intervention   Assessment shows need for Vocational Rehabilitation No      Education: Education Goals: Education classes will be provided on a weekly basis, covering required topics. Participant will state understanding/return demonstration of topics presented.  Learning Barriers/Preferences:     Learning Barriers/Preferences - 10/20/16 1433      Learning Barriers/Preferences   Learning Barriers None   Learning Preferences Individual Instruction;Skilled Demonstration;Group Instruction      Education Topics: General Nutrition Guidelines/Fats and Fiber: -Group instruction provided by verbal, written material, models and posters to present the general guidelines for heart healthy nutrition. Gives an explanation and  review of dietary fats and fiber.   Controlling Sodium/Reading Food Labels: -Group verbal and written material supporting the discussion of sodium use in heart healthy nutrition. Review and explanation with models, verbal and written materials for utilization of the food label.   Exercise Physiology & Risk Factors: - Group verbal and written instruction with models to review the exercise physiology of the cardiovascular system and associated critical values. Details cardiovascular disease risk factors and the goals associated with each risk factor.   Aerobic Exercise & Resistance Training: - Gives group verbal and written discussion on the health impact of inactivity. On the components of aerobic and resistive training programs and the benefits of this training and how to safely progress through these programs.   Flexibility, Balance, General Exercise Guidelines: - Provides group verbal and written instruction on the benefits of flexibility and balance training programs. Provides general exercise guidelines with specific guidelines to those with heart or lung disease. Demonstration and skill practice  provided.   Stress Management: - Provides group verbal and written instruction about the health risks of elevated stress, cause of high stress, and healthy ways to reduce stress.   Depression: - Provides group verbal and written instruction on the correlation between heart/lung disease and depressed mood, treatment options, and the stigmas associated with seeking treatment.   Anatomy & Physiology of the Heart: - Group verbal and written instruction and models provide basic cardiac anatomy and physiology, with the coronary electrical and arterial systems. Review of: AMI, Angina, Valve disease, Heart Failure, Cardiac Arrhythmia, Pacemakers, and the ICD.   Cardiac Procedures: - Group verbal and written instruction and models to describe the testing methods done to diagnose heart disease. Reviews the outcomes of the test results. Describes the treatment choices: Medical Management, Angioplasty, or Coronary Bypass Surgery.   Cardiac Medications: - Group verbal and written instruction to review commonly prescribed medications for heart disease. Reviews the medication, class of the drug, and side effects. Includes the steps to properly store meds and maintain the prescription regimen.   Go Sex-Intimacy & Heart Disease, Get SMART - Goal Setting: - Group verbal and written instruction through game format to discuss heart disease and the return to sexual intimacy. Provides group verbal and written material to discuss and apply goal setting through the application of the S.M.A.R.T. Method.   Other Matters of the Heart: - Provides group verbal, written materials and models to describe Heart Failure, Angina, Valve Disease, and Diabetes in the realm of heart disease. Includes description of the disease process and treatment options available to the cardiac patient.   Exercise & Equipment Safety: - Individual verbal instruction and demonstration of equipment use and safety with use of the  equipment.   Cardiac Rehab from 10/20/2016 in [facility] Cardiac and Pulmonary Rehab  Date  10/20/16  Educator  SB  Instruction Review Code  2- meets goals/outcomes      Infection Prevention: - Provides verbal and written material to individual with discussion of infection control including proper hand washing and proper equipment cleaning during exercise session.   Cardiac Rehab from 10/20/2016 in [facility] Cardiac and Pulmonary Rehab  Date  10/20/16  Educator  Sb  Instruction Review Code  2- meets goals/outcomes      Falls Prevention: - Provides verbal and written material to individual with discussion of falls prevention and safety.   Cardiac Rehab from 10/20/2016 in [facility] Cardiac and Pulmonary Rehab  Date  10/20/16  Educator  Sb  Instruction Review Code  2-  meets goals/outcomes      Diabetes: - Individual verbal and written instruction to review signs/symptoms of diabetes, desired ranges of glucose level fasting, after meals and with exercise. Advice that pre and post exercise glucose checks will be done for 3 sessions at entry of program.    Knowledge Questionnaire Score:     Knowledge Questionnaire Score - 10/20/16 1433      Knowledge Questionnaire Score   Pre Score 24/28  Reviewed correct responses with Malachy Mood. She verbalized understanding of the response.       Core Components/Risk Factors/Patient Goals at Admission:     Personal Goals and Risk Factors at Admission - 10/20/16 1423      Core Components/Risk Factors/Patient Goals on Admission    Weight Management Obesity;Yes;Weight Loss   Intervention Weight Management: Develop a combined nutrition and exercise program designed to reach desired caloric intake, while maintaining appropriate intake of nutrient and fiber, sodium and fats, and appropriate energy expenditure required for the weight goal.;Weight Management: Provide education and appropriate resources to help participant work on and attain dietary goals.;Weight  Management/Obesity: Establish reasonable short term and long term weight goals.;Obesity: Provide education and appropriate resources to help participant work on and attain dietary goals.   Admit Weight 208 lb (94.3 kg)   Goal Weight: Short Term 205 lb (93 kg)   Goal Weight: Long Term 175 lb (79.4 kg)   Expected Outcomes Short Term: Continue to assess and modify interventions until short term weight is achieved;Long Term: Adherence to nutrition and physical activity/exercise program aimed toward attainment of established weight goal;Weight Loss: Understanding of general recommendations for a balanced deficit meal plan, which promotes 1-2 lb weight loss per week and includes a negative energy balance of [phone] kcal/d;Understanding recommendations for meals to include 15-35% energy as protein, 25-35% energy from fat, 35-60% energy from carbohydrates, less than 200mg  of dietary cholesterol, 20-35 gm of total fiber daily;Understanding of distribution of calorie intake throughout the day with the consumption of 4-5 meals/snacks   Hypertension Yes   Intervention Provide education on lifestyle modifcations including regular physical activity/exercise, weight management, moderate sodium restriction and increased consumption of fresh fruit, vegetables, and low fat dairy, alcohol moderation, and smoking cessation.;Monitor prescription use compliance.   Expected Outcomes Short Term: Continued assessment and intervention until BP is < 140/66mm HG in hypertensive participants. < 130/96mm HG in hypertensive participants with diabetes, heart failure or chronic kidney disease.;Long Term: Maintenance of blood pressure at goal levels.   Lipids Yes   Intervention Provide education and support for participant on nutrition & aerobic/resistive exercise along with prescribed medications to achieve LDL 70mg , HDL >40mg .   Expected Outcomes Short Term: Participant states understanding of desired cholesterol values and is  compliant with medications prescribed. Participant is following exercise prescription and nutrition guidelines.;Long Term: Cholesterol controlled with medications as prescribed, with individualized exercise RX and with personalized nutrition plan. Value goals: LDL < 70mg , HDL > 40 mg.      Core Components/Risk Factors/Patient Goals Review:    Core Components/Risk Factors/Patient Goals at Discharge (Final Review):    ITP Comments:     ITP Comments    Row Name 10/20/16 1405           ITP Comments Medical Review completed . Inital ITP created. Documentation of diagnosis can be found in [facility]  Admission 09/12/2016.          Comments: Initial ITP

## 2016-10-20 NOTE — Progress Notes (Signed)
Daily Session Note  Patient Details  Name: Darlene Lawrence MRN: [id number] Date of Birth: July 29, 1940 Referring Provider:    Encounter Date: 10/20/2016  Check In:     Session Check In - 10/20/16 1400      Check-In   Location [facility]-Cardiac & Pulmonary Rehab   Staff Present Heath Lark, RN, BSN, [place], [place], ACSM RCEP, Exercise Physiologist   Supervising physician immediately available to respond to emergencies See telemetry face sheet for immediately available ER MD   Medication changes reported     No   Fall or balance concerns reported    No   Warm-up and Cool-down Performed as group-led instruction   Resistance Training Performed Yes   VAD Patient? No     Pain Assessment   Currently in Pain? No/denies           Exercise Prescription Changes - 10/20/16 1400      Response to Exercise   Blood Pressure (Admit) 126/64   Blood Pressure (Exercise) 132/66   Blood Pressure (Exit) 126/70   Heart Rate (Admit) 44 bpm   Heart Rate (Exercise) 93 bpm   Heart Rate (Exit) 53 bpm   Rating of Perceived Exertion (Exercise) 13      History  Smoking Status  . Never Smoker  Smokeless Tobacco  . Never Used    Goals Met:  Personal goals reviewed No report of cardiac concerns or symptoms Strength training completed today  Goals Unmet:  Not Applicable  Comments: Medical Review completed today   Dr. Emily Filbert is Medical Director for [facility].

## 2016-10-21 DIAGNOSIS — M5033 Other cervical disc degeneration, cervicothoracic region: Secondary | ICD-10-CM | POA: Diagnosis not present

## 2016-10-21 DIAGNOSIS — M9901 Segmental and somatic dysfunction of cervical region: Secondary | ICD-10-CM | POA: Diagnosis not present

## 2016-10-21 DIAGNOSIS — M9902 Segmental and somatic dysfunction of thoracic region: Secondary | ICD-10-CM | POA: Diagnosis not present

## 2016-10-21 DIAGNOSIS — M62830 Muscle spasm of back: Secondary | ICD-10-CM | POA: Diagnosis not present

## 2016-10-24 ENCOUNTER — Other Ambulatory Visit: Payer: Self-pay | Admitting: *Deleted

## 2016-10-24 ENCOUNTER — Encounter: Payer: Self-pay | Admitting: *Deleted

## 2016-10-24 NOTE — Patient Outreach (Signed)
[facility] Care Management [place] Telephone Outreach, Transition of Care day 31  10/24/2016  TERRIE HARING Aug 24, 1940 [id number]  Successful telephone outreach to Darlene Lawrence, 76 y.o. female referred to [place] for transition of care after hospitalization May 4-14, 2018 for CAP/ CHF exacerbation andNSTEMI. Patient was discharged home with home health [facility]) services which are now complete. Patient has history including, but not limited to, breast cancer, HTN, HLD, (R) BBB, and CKD stage III. HIPAA/ identity verified with patient during phone call today.   Today, patient continues to report that she "is muchbetter" and "feeling really very good."Patient is in no distress throughout entirety of phone call today.   Reports:  -- Attended cardiac rehab initial evaluation Mon 10/20/16; To start "next week;" patient reports she will be going "twice a week for 10 weeks; " confirms that she will drive herself to cardiac rehab and provider appointments.  -- Has all medicationsand takes as prescribed;denies questions/ concerns around current medications.   -- Provider appointments: Has attended all scheduled provider appointments post-hospital discharge. Provides accurate report of upcoming provider appointments as noted in EMR, verbalizes plans to attend all scheduled appointments.  All provider appointments reviewed with patient today.  -- Safety/ Mobility/ Falls: Denies new falls. No longer using assistive devices.  -- Self-health management of chronic disease state of CHF: Reports that she has continued monitoring/ recording daily weights, reports weight today of 208lbs,  again reporting that weight has ranged "stayed within a half pound of 208 pounds" over last week. Verbalizes accurate understanding to contact provider for weight gain of 3pounds overnight, 5 pounds in one week. Has not neededO2 at home sinceMonday 10/06/16,  confirms has had no difficulty breathing/ being without O2 . Denies issues/ concernswith shortness of breath, LE swelling. CHF zones with corresponding action plans reviewed again with patient today, and patient confirms that she reviewed EMMI educational material provided to her during [place] home visit last week.  Patient reports in "green zone" today, "have been in green zone all week."  Patient denies further issues, concerns, or problems today. I confirmed that patient hasmy direct phone number, the main [facility] CM office phone number, and the [facility] CM 24-hour nurse advice phone number should issues arise prior to next scheduled [place] outreach with scheduled phone call in 2 weeks.  Plan:  Patient will take medications as prescribed and will attend all scheduled provider appointments.  Patient will continue monitoring and recording daily weights, and will report weight gain (>3lbs overnight/ 5 lbs in one week) to providers.  Patient will promptly notify providers of any new concerns, problems, or issues post- hospital discharge.  Patient will actively participate in OP cardiac rehabilitation  [place] outreach to continue with scheduled phone call in 2 weeks.  Oneta Rack, RN, BSN, [facility] Care Management  [phone]

## 2016-10-28 ENCOUNTER — Encounter: Payer: Medicare Other | Admitting: Respiratory Therapy

## 2016-10-28 DIAGNOSIS — I214 Non-ST elevation (NSTEMI) myocardial infarction: Secondary | ICD-10-CM | POA: Diagnosis not present

## 2016-10-28 DIAGNOSIS — Z79899 Other long term (current) drug therapy: Secondary | ICD-10-CM | POA: Diagnosis not present

## 2016-10-28 NOTE — Progress Notes (Signed)
Daily Session Note  Patient Details  Name: Darlene Lawrence MRN: [id number] Date of Birth: [birth year] Referring Provider:     Cardiac Rehab from 10/20/2016 in [facility] Cardiac and Pulmonary Rehab  Referring Provider  Bartholome Bill MD      Encounter Date: 10/28/2016  Check In:     Session Check In - 10/28/16 0826      Check-In   Location [facility]-Cardiac & Pulmonary Rehab   Staff Present Heath Lark, RN, BSN, CCRP;Laureen Owens Shark, BS, RRT, Respiratory Lennie Hummer, MA, ACSM RCEP, Exercise Physiologist;Joseph Flavia Shipper   Supervising physician immediately available to respond to emergencies See telemetry face sheet for immediately available ER MD   Medication changes reported     No   Fall or balance concerns reported    No   Tobacco Cessation No Change   Warm-up and Cool-down Performed on first and last piece of equipment   Resistance Training Performed Yes   VAD Patient? No     Pain Assessment   Currently in Pain? No/denies         History  Smoking Status   Never Smoker  Smokeless Tobacco   Never Used    Goals Met:  Proper associated with RPD/PD & O2 Sat Independence with exercise equipment Exercise tolerated well No report of cardiac concerns or symptoms Strength training completed today  Goals Unmet:  Not Applicable  Comments: First full day of exercise!  Patient was oriented to gym and equipment including functions, settings, policies, and procedures.  Patient's individual exercise prescription and treatment plan were reviewed.  All starting workloads were established based on the results of the 6 minute walk test done at initial orientation visit.  The plan for exercise progression was also introduced and progression will be customized based on patient's performance and goals.   Dr. Emily Filbert is Medical Director for [facility] and [facility].

## 2016-10-29 ENCOUNTER — Encounter: Payer: Self-pay | Admitting: *Deleted

## 2016-10-29 DIAGNOSIS — I214 Non-ST elevation (NSTEMI) myocardial infarction: Secondary | ICD-10-CM

## 2016-10-29 NOTE — Progress Notes (Signed)
Cardiac Individual Treatment Plan  Patient Details  Name: Darlene Lawrence MRN: [id number] Date of Birth: 1941-04-24 Referring Provider:     Cardiac Rehab from 10/20/2016 in [facility] Cardiac and Pulmonary Rehab  Referring Provider  Bartholome Bill MD      Initial Encounter Date:    Cardiac Rehab from 10/20/2016 in [facility] Cardiac and Pulmonary Rehab  Date  10/20/16  Referring Provider  Bartholome Bill MD      Visit Diagnosis: NSTEMI (non-ST elevated myocardial infarction) [facility])  Patient's Home Medications on Admission:  Current Outpatient Prescriptions:  .  Aspirin (ASPIR-81 PO), Take 1 tablet by mouth., Disp: , Rfl:  .  b complex vitamins tablet, Take 1 tablet by mouth daily., Disp: , Rfl:  .  Calcium Citrate-Vitamin D (CALCIUM CITRATE + PO), Take 1 tablet by mouth daily., Disp: , Rfl:  .  Cholecalciferol (VITAMIN D3) 2000 UNITS TABS, Take 1 capsule by mouth 2 (two) times daily., Disp: , Rfl:  .  Coenzyme Q10 (COQ10) 100 MG CAPS, Take 1 capsule by mouth daily., Disp: , Rfl:  .  furosemide (LASIX) 20 MG tablet, Take 1 tablet (20 mg total) by mouth daily., Disp: 30 tablet, Rfl: 0 .  metoprolol tartrate (LOPRESSOR) 25 MG tablet, Take 1 tablet (25 mg total) by mouth 2 (two) times daily., Disp: 60 tablet, Rfl: 0 .  Misc Natural Products (GLUCOSAMINE CHOND MSM FORMULA PO), Take 1,500 mg by mouth., Disp: , Rfl:  .  Multiple Vitamin (MULTI VITAMIN DAILY PO), Take 1 tablet by mouth., Disp: , Rfl:  .  Omega-3 Fatty Acids (FISH OIL) 1200 MG CAPS, Take 1 capsule by mouth., Disp: , Rfl:  .  rosuvastatin (CRESTOR) 10 MG tablet, Take 10 mg by mouth 2 (two) times a week. Monday, Thursday., Disp: , Rfl:   Past Medical History: Past Medical History:  Diagnosis Date  . Abnormal Pap smear   . Arthritis   . breast cancer 2009   right  . Cancer ([place])   . CHF (congestive heart failure) ([place])   . DDD (degenerative disc disease), lumbar   . High cholesterol   . Hyperlipidemia   . Hypertension   . Meniscus  tear    right  . Personal history of radiation therapy   . RBBB     Tobacco Use: History  Smoking Status  . Never Smoker  Smokeless Tobacco  . Never Used    Labs: Recent Review Flowsheet Data    Labs for ITP Cardiac and Pulmonary Rehab Latest Ref Rng & Units 06/27/2014 10/11/2015 09/13/2016 09/13/2016 09/13/2016   Cholestrol 0 - 200 mg/dL 169 150 114 - -   LDLCALC 0 - 99 mg/dL 94 80 62 - -   HDL >40 mg/dL 47.60 43.50 26(L) - -   Trlycerides <150 mg/dL 136.0 133.0 129 - -   PHART 7.350 - 7.450 - - - 7.48(H) 7.53(H)   PCO2ART 32.0 - 48.0 mmHg - - - 34 31(L)   HCO3 20.0 - 28.0 mmol/L - - - 25.3 25.9   O2SAT % - - - 87.9 98.2       Exercise Target Goals:    Exercise Program Goal: Individual exercise prescription set with THRR, safety & activity barriers. Participant demonstrates ability to understand and report RPE using BORG scale, to self-measure pulse accurately, and to acknowledge the importance of the exercise prescription.  Exercise Prescription Goal: Starting with aerobic activity 30 plus minutes a day, 3 days per week for initial exercise prescription. Provide home  exercise prescription and guidelines that participant acknowledges understanding prior to discharge.  Activity Barriers & Risk Stratification:     Activity Barriers & Cardiac Risk Stratification - 10/20/16 1415      Activity Barriers & Cardiac Risk Stratification   Activity Barriers Left Hip Replacement;Right Knee Replacement;Back Problems;Deconditioning;Muscular Weakness;Shortness of Breath;Other (comment);Balance Concerns   Comments menicus tear, back pain after doing some housework   Cardiac Risk Stratification High      6 Minute Walk:     6 Minute Walk    Row Name 10/20/16 1454         6 Minute Walk   Phase Initial     Distance 1060 feet     Walk Time 6 minutes     # of Rest Breaks 0     MPH 2     METS 1.79     RPE 13     Perceived Dyspnea  1     VO2 Peak 6.28     Symptoms Yes (comment)      Comments slightly SOB, no pain     Resting HR 44 bpm     Resting BP 126/64     Max Ex. HR 93 bpm     Max Ex. BP 132/66     2 Minute Post BP 126/70        Oxygen Initial Assessment:   Oxygen Re-Evaluation:   Oxygen Discharge (Final Oxygen Re-Evaluation):   Initial Exercise Prescription:     Initial Exercise Prescription - 10/20/16 1400      Date of Initial Exercise RX and Referring Provider   Date 10/20/16   Referring Provider Bartholome Bill MD     Treadmill   MPH 1   Grade 0.5   Minutes 15   METs 1.83     NuStep   Level 1   SPM 80   Minutes 15   METs 1.7     REL-XR   Level 1   Speed 50   Minutes 15   METs 1.7     Prescription Details   Frequency (times per week) 2   Duration Progress to 45 minutes of aerobic exercise without signs/symptoms of physical distress     Intensity   THRR 40-80% of Max Heartrate 85-126   Ratings of Perceived Exertion 11-13   Perceived Dyspnea 0-4     Progression   Progression Continue to progress workloads to maintain intensity without signs/symptoms of physical distress.     Resistance Training   Training Prescription Yes   Weight 2 lbs   Reps 10-15      Perform Capillary Blood Glucose checks as needed.  Exercise Prescription Changes:     Exercise Prescription Changes    Row Name 10/20/16 1400             Response to Exercise   Blood Pressure (Admit) 126/64       Blood Pressure (Exercise) 132/66       Blood Pressure (Exit) 126/70       Heart Rate (Admit) 44 bpm       Heart Rate (Exercise) 93 bpm       Heart Rate (Exit) 53 bpm       Oxygen Saturation (Admit) 99 %       Oxygen Saturation (Exercise) 94 %       Rating of Perceived Exertion (Exercise) 13       Perceived Dyspnea (Exercise) 1       Symptoms Slightly SOB, no pain  Comments walk test results          Exercise Comments:     Exercise Comments    Row Name 10/28/16 1027           Exercise Comments First full day of exercise!   Patient was oriented to gym and equipment including functions, settings, policies, and procedures.  Patient's individual exercise prescription and treatment plan were reviewed.  All starting workloads were established based on the results of the 6 minute walk test done at initial orientation visit.  The plan for exercise progression was also introduced and progression will be customized based on patient's performance and goals.          Exercise Goals and Review:     Exercise Goals    Row Name 10/20/16 1501             Exercise Goals   Increase Physical Activity Yes       Intervention Provide advice, education, support and counseling about physical activity/exercise needs.;Develop an individualized exercise prescription for aerobic and resistive training based on initial evaluation findings, risk stratification, comorbidities and participant's personal goals.       Expected Outcomes Achievement of increased cardiorespiratory fitness and enhanced flexibility, muscular endurance and strength shown through measurements of functional capacity and personal statement of participant.       Increase Strength and Stamina Yes       Intervention Provide advice, education, support and counseling about physical activity/exercise needs.;Develop an individualized exercise prescription for aerobic and resistive training based on initial evaluation findings, risk stratification, comorbidities and participant's personal goals.       Expected Outcomes Achievement of increased cardiorespiratory fitness and enhanced flexibility, muscular endurance and strength shown through measurements of functional capacity and personal statement of participant.          Exercise Goals Re-Evaluation :   Discharge Exercise Prescription (Final Exercise Prescription Changes):     Exercise Prescription Changes - 10/20/16 1400      Response to Exercise   Blood Pressure (Admit) 126/64   Blood Pressure (Exercise) 132/66    Blood Pressure (Exit) 126/70   Heart Rate (Admit) 44 bpm   Heart Rate (Exercise) 93 bpm   Heart Rate (Exit) 53 bpm   Oxygen Saturation (Admit) 99 %   Oxygen Saturation (Exercise) 94 %   Rating of Perceived Exertion (Exercise) 13   Perceived Dyspnea (Exercise) 1   Symptoms Slightly SOB, no pain   Comments walk test results      Nutrition:  Target Goals: Understanding of nutrition guidelines, daily intake of sodium 1500mg , cholesterol 200mg , calories 30% from fat and 7% or less from saturated fats, daily to have 5 or more servings of fruits and vegetables.  Biometrics:     Pre Biometrics - 10/20/16 1502      Pre Biometrics   Height 5' 5.8" (1.671 m)   Weight 211 lb 6.4 oz (95.9 kg)   Waist Circumference 40 inches   Hip Circumference 51 inches   Waist to Hip Ratio 0.78 %   BMI (Calculated) 34.4   Single Leg Stand 0.58 seconds       Nutrition Therapy Plan and Nutrition Goals:     Nutrition Therapy & Goals - 10/20/16 1418      Intervention Plan   Intervention Prescribe, educate and counsel regarding individualized specific dietary modifications aiming towards targeted core components such as weight, hypertension, lipid management, diabetes, heart failure and other comorbidities.   Expected Outcomes Short Term  Goal: Understand basic principles of dietary content, such as calories, fat, sodium, cholesterol and nutrients.;Short Term Goal: A plan has been developed with personal nutrition goals set during dietitian appointment.;Long Term Goal: Adherence to prescribed nutrition plan.      Nutrition Discharge: Rate Your Plate Scores:     Nutrition Assessments - 10/20/16 1420      MEDFICTS Scores   Pre Score 38      Nutrition Goals Re-Evaluation:   Nutrition Goals Discharge (Final Nutrition Goals Re-Evaluation):   Psychosocial: Target Goals: Acknowledge presence or absence of significant depression and/or stress, maximize coping skills, provide positive support  system. Participant is able to verbalize types and ability to use techniques and skills needed for reducing stress and depression.   Initial Review & Psychosocial Screening:     Initial Psych Review & Screening - 10/20/16 1424      Initial Review   Current issues with Current Sleep Concerns;Current Stress Concerns  Since hospital: is improving some.  Continues to require nap daily which is not her norm   Source of Stress Concerns Unable to perform yard/household activities   Comments Still not feeling strong enough to do chores.  Length of stay in hospital was debilitating.  Had home health after discharge and is doing better though not back to normal.     [place]? Yes  Husband and her 70 year old disabled son.     Barriers   Psychosocial barriers to participate in program There are no identifiable barriers or psychosocial needs.;The patient should benefit from training in stress management and relaxation.     Screening Interventions   Interventions Encouraged to exercise;To provide support and resources with identified psychosocial needs;Provide feedback about the scores to participant      Quality of Life Scores:      Quality of Life - 10/20/16 1429      Quality of Life Scores   Health/Function Pre 20.8 %   Socioeconomic Pre 28.8 %   Psych/Spiritual Pre 22.29 %   Family Pre 28.8 %   GLOBAL Pre 23.63 %      PHQ-9: Recent Review Flowsheet Data    Depression screen [facility] 2/9 10/20/2016 10/16/2016 09/29/2016 10/11/2015 07/04/2014   Decreased Interest 0 0 0 0 0   Down, Depressed, Hopeless 0 0 0 0 0   PHQ - 2 Score 0 0 0 0 0   Altered sleeping 1 - - - -   Tired, decreased energy 1  - - - -   Change in appetite 0 - - - -   Feeling bad or failure about yourself  0 - - - -   Trouble concentrating 0 - - - -   Moving slowly or fidgety/restless 0 - - - -   Suicidal thoughts 0 - - - -   PHQ-9 Score 2 - - - -   Difficult doing work/chores Somewhat  difficult - - - -     Interpretation of Total Score  Total Score Depression Severity:  1-4 = Minimal depression, 5-9 = Mild depression, 10-14 = Moderate depression, 15-19 = Moderately severe depression, 20-27 = Severe depression   Psychosocial Evaluation and Intervention:   Psychosocial Re-Evaluation:   Psychosocial Discharge (Final Psychosocial Re-Evaluation):   Vocational Rehabilitation: Provide vocational rehab assistance to qualifying candidates.   Vocational Rehab Evaluation & Intervention:     Vocational Rehab - 10/20/16 1434      Initial Vocational Rehab Evaluation & Intervention  Assessment shows need for Vocational Rehabilitation No      Education: Education Goals: Education classes will be provided on a weekly basis, covering required topics. Participant will state understanding/return demonstration of topics presented.  Learning Barriers/Preferences:     Learning Barriers/Preferences - 10/20/16 1433      Learning Barriers/Preferences   Learning Barriers None   Learning Preferences Individual Instruction;Skilled Demonstration;Group Instruction      Education Topics: General Nutrition Guidelines/Fats and Fiber: -Group instruction provided by verbal, written material, models and posters to present the general guidelines for heart healthy nutrition. Gives an explanation and review of dietary fats and fiber.   Controlling Sodium/Reading Food Labels: -Group verbal and written material supporting the discussion of sodium use in heart healthy nutrition. Review and explanation with models, verbal and written materials for utilization of the food label.   Exercise Physiology & Risk Factors: - Group verbal and written instruction with models to review the exercise physiology of the cardiovascular system and associated critical values. Details cardiovascular disease risk factors and the goals associated with each risk factor.   Aerobic Exercise & Resistance  Training: - Gives group verbal and written discussion on the health impact of inactivity. On the components of aerobic and resistive training programs and the benefits of this training and how to safely progress through these programs.   Flexibility, Balance, General Exercise Guidelines: - Provides group verbal and written instruction on the benefits of flexibility and balance training programs. Provides general exercise guidelines with specific guidelines to those with heart or lung disease. Demonstration and skill practice provided.   Stress Management: - Provides group verbal and written instruction about the health risks of elevated stress, cause of high stress, and healthy ways to reduce stress.   Depression: - Provides group verbal and written instruction on the correlation between heart/lung disease and depressed mood, treatment options, and the stigmas associated with seeking treatment.   Anatomy & Physiology of the Heart: - Group verbal and written instruction and models provide basic cardiac anatomy and physiology, with the coronary electrical and arterial systems. Review of: AMI, Angina, Valve disease, Heart Failure, Cardiac Arrhythmia, Pacemakers, and the ICD.   Cardiac Procedures: - Group verbal and written instruction and models to describe the testing methods done to diagnose heart disease. Reviews the outcomes of the test results. Describes the treatment choices: Medical Management, Angioplasty, or Coronary Bypass Surgery.   Cardiac Medications: - Group verbal and written instruction to review commonly prescribed medications for heart disease. Reviews the medication, class of the drug, and side effects. Includes the steps to properly store meds and maintain the prescription regimen.   Go Sex-Intimacy & Heart Disease, Get SMART - Goal Setting: - Group verbal and written instruction through game format to discuss heart disease and the return to sexual intimacy. Provides  group verbal and written material to discuss and apply goal setting through the application of the S.M.A.R.T. Method.   Other Matters of the Heart: - Provides group verbal, written materials and models to describe Heart Failure, Angina, Valve Disease, and Diabetes in the realm of heart disease. Includes description of the disease process and treatment options available to the cardiac patient.   Exercise & Equipment Safety: - Individual verbal instruction and demonstration of equipment use and safety with use of the equipment.   Cardiac Rehab from 10/28/2016 in [facility] Cardiac and Pulmonary Rehab  Date  10/20/16  Educator  SB  Instruction Review Code  2- meets goals/outcomes  Infection Prevention: - Provides verbal and written material to individual with discussion of infection control including proper hand washing and proper equipment cleaning during exercise session.   Cardiac Rehab from 10/28/2016 in [facility] Cardiac and Pulmonary Rehab  Date  10/20/16  Educator  Sb  Instruction Review Code  2- meets goals/outcomes      Falls Prevention: - Provides verbal and written material to individual with discussion of falls prevention and safety.   Cardiac Rehab from 10/28/2016 in [facility] Cardiac and Pulmonary Rehab  Date  10/20/16  Educator  Sb  Instruction Review Code  2- meets goals/outcomes      Diabetes: - Individual verbal and written instruction to review signs/symptoms of diabetes, desired ranges of glucose level fasting, after meals and with exercise. Advice that pre and post exercise glucose checks will be done for 3 sessions at entry of program.    Knowledge Questionnaire Score:     Knowledge Questionnaire Score - 10/20/16 1433      Knowledge Questionnaire Score   Pre Score 24/28  Reviewed correct responses with Malachy Mood. She verbalized understanding of the response.       Core Components/Risk Factors/Patient Goals at Admission:     Personal Goals and Risk Factors at  Admission - 10/20/16 1423      Core Components/Risk Factors/Patient Goals on Admission    Weight Management Obesity;Yes;Weight Loss   Intervention Weight Management: Develop a combined nutrition and exercise program designed to reach desired caloric intake, while maintaining appropriate intake of nutrient and fiber, sodium and fats, and appropriate energy expenditure required for the weight goal.;Weight Management: Provide education and appropriate resources to help participant work on and attain dietary goals.;Weight Management/Obesity: Establish reasonable short term and long term weight goals.;Obesity: Provide education and appropriate resources to help participant work on and attain dietary goals.   Admit Weight 208 lb (94.3 kg)   Goal Weight: Short Term 205 lb (93 kg)   Goal Weight: Long Term 175 lb (79.4 kg)   Expected Outcomes Short Term: Continue to assess and modify interventions until short term weight is achieved;Long Term: Adherence to nutrition and physical activity/exercise program aimed toward attainment of established weight goal;Weight Loss: Understanding of general recommendations for a balanced deficit meal plan, which promotes 1-2 lb weight loss per week and includes a negative energy balance of [phone] kcal/d;Understanding recommendations for meals to include 15-35% energy as protein, 25-35% energy from fat, 35-60% energy from carbohydrates, less than 200mg  of dietary cholesterol, 20-35 gm of total fiber daily;Understanding of distribution of calorie intake throughout the day with the consumption of 4-5 meals/snacks   Hypertension Yes   Intervention Provide education on lifestyle modifcations including regular physical activity/exercise, weight management, moderate sodium restriction and increased consumption of fresh fruit, vegetables, and low fat dairy, alcohol moderation, and smoking cessation.;Monitor prescription use compliance.   Expected Outcomes Short Term: Continued  assessment and intervention until BP is < 140/73mm HG in hypertensive participants. < 130/57mm HG in hypertensive participants with diabetes, heart failure or chronic kidney disease.;Long Term: Maintenance of blood pressure at goal levels.   Lipids Yes   Intervention Provide education and support for participant on nutrition & aerobic/resistive exercise along with prescribed medications to achieve LDL 70mg , HDL >40mg .   Expected Outcomes Short Term: Participant states understanding of desired cholesterol values and is compliant with medications prescribed. Participant is following exercise prescription and nutrition guidelines.;Long Term: Cholesterol controlled with medications as prescribed, with individualized exercise RX and with personalized nutrition plan. Value goals:  LDL < 70mg , HDL > 40 mg.      Core Components/Risk Factors/Patient Goals Review:    Core Components/Risk Factors/Patient Goals at Discharge (Final Review):    ITP Comments:     ITP Comments    Row Name 10/20/16 1405 10/29/16 0552         ITP Comments Medical Review completed . Inital ITP created. Documentation of diagnosis can be found in [facility]  Admission 09/12/2016. 30 day review. Continue with ITP unless directed changes per Medical Director review. New to program         Comments:

## 2016-10-30 DIAGNOSIS — I214 Non-ST elevation (NSTEMI) myocardial infarction: Secondary | ICD-10-CM

## 2016-10-30 DIAGNOSIS — Z79899 Other long term (current) drug therapy: Secondary | ICD-10-CM | POA: Diagnosis not present

## 2016-10-30 NOTE — Progress Notes (Signed)
Daily Session Note  Patient Details  Name: Darlene Lawrence MRN: [id number] Date of Birth: 1940-10-07 Referring Provider:     Cardiac Rehab from 10/20/2016 in [facility] Cardiac and Pulmonary Rehab  Referring Provider  Bartholome Bill MD      Encounter Date: 10/30/2016  Check In:     Session Check In - 10/30/16 0950      Check-In   Location [facility]-Cardiac & Pulmonary Rehab   Staff Present Alberteen Sam, MA, ACSM RCEP, Exercise Physiologist;Amanda Oletta Darter, BA, ACSM CEP, Exercise Physiologist;Meredith Sherryll Burger, RN BSN;Davien Malone Flavia Shipper   Supervising physician immediately available to respond to emergencies See telemetry face sheet for immediately available ER MD   Medication changes reported     No   Fall or balance concerns reported    No   Tobacco Cessation No Change   Warm-up and Cool-down Performed on first and last piece of equipment   Resistance Training Performed Yes   VAD Patient? No     Pain Assessment   Currently in Pain? No/denies   Multiple Pain Sites No         History  Smoking Status  . Never Smoker  Smokeless Tobacco  . Never Used    Goals Met:  Independence with exercise equipment Exercise tolerated well No report of cardiac concerns or symptoms Strength training completed today  Goals Unmet:  Not Applicable  Comments: Pt able to follow exercise prescription today without complaint.  Will continue to monitor for progression.   Dr. Emily Filbert is Medical Director for [name].

## 2016-11-02 ENCOUNTER — Telehealth: Payer: Self-pay | Admitting: Family Medicine

## 2016-11-02 NOTE — Telephone Encounter (Signed)
-----   Message from Eustace Pen, LPN sent at [phone]/91/6945  4:06 PM EDT ----- Regarding: Labs 6/25 Traditional Medicare  Please place lab orders. If none, please let me know.

## 2016-11-02 NOTE — Telephone Encounter (Signed)
No labs needed given recent hospitalization.

## 2016-11-03 ENCOUNTER — Ambulatory Visit (INDEPENDENT_AMBULATORY_CARE_PROVIDER_SITE_OTHER): Payer: Medicare Other

## 2016-11-03 VITALS — BP 124/82 | HR 50 | Temp 98.1°F | Ht 64.25 in | Wt 211.8 lb

## 2016-11-03 DIAGNOSIS — Z0000 Encounter for general adult medical examination without abnormal findings: Secondary | ICD-10-CM | POA: Diagnosis not present

## 2016-11-03 NOTE — Progress Notes (Signed)
PCP notes:   Health maintenance:  No gaps identified.  Abnormal screenings:   Hearing - failed  Patient concerns:   Pt is recovering from recent hospitalization and is currently participating in cardiac rehab twice weekly.  Nurse concerns:  None  Next PCP appt:   11/07/16 @ 1430

## 2016-11-03 NOTE — Progress Notes (Signed)
Subjective:   Darlene Lawrence is a 76 y.o. female who presents for Medicare Annual (Subsequent) preventive examination.  Review of Systems:  N/A Cardiac Risk Factors include: advanced age (>15men, >47 women);obesity (BMI >30kg/m2);dyslipidemia;hypertension     Objective:     Vitals: BP 124/82 (BP Location: Left Arm, Patient Position: Sitting, Cuff Size: Normal)   Pulse (!) 50   Temp 98.1 F (36.7 C) (Oral)   Ht 5' 4.25" (1.632 m) Comment: no shoes  Wt 211 lb 12 oz (96 kg)   SpO2 96%   BMI 36.06 kg/m   Body mass index is 36.06 kg/m.   Tobacco History  Smoking Status  . Never Smoker  Smokeless Tobacco  . Never Used     Counseling given: No   Past Medical History:  Diagnosis Date  . Abnormal Pap smear   . Arthritis   . breast cancer 2009   right  . Cancer ([place])   . CHF (congestive heart failure) ([place])   . DDD (degenerative disc disease), lumbar   . High cholesterol   . Hyperlipidemia   . Hypertension   . Meniscus tear    right  . Personal history of radiation therapy   . RBBB    Past Surgical History:  Procedure Laterality Date  . BREAST BIOPSY Right 08/18/2007  . BREAST LUMPECTOMY Right 2009  . BREAST SURGERY Right 2009   lumpectomy, no radiation, no chemo  . DILATION AND CURETTAGE OF UTERUS  2012   Dr. Fermin Schwab  . HERNIA REPAIR     umbilical  . JOINT REPLACEMENT Left    hip  . LEFT HEART CATH AND CORONARY ANGIOGRAPHY N/A 09/17/2016   Procedure: Left Heart Cath and Coronary Angiography;  Surgeon: Teodoro Spray, MD;  Location: [facility];  Service: Cardiovascular;  Laterality: N/A;  . TOTAL HIP ARTHROPLASTY Left   . 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 TUBAL LIGATION     Family History  Problem Relation Age of Onset  . Cancer Mother        breast and colon  . Breast cancer Mother   .  Hypertension Father   . Heart disease Father   . Thyroid disease Father   . Down syndrome Son   . Asthma Other    History  Sexual Activity  . Sexual activity: Yes  . Partners: Male  . Birth control/ protection: Post-menopausal    Outpatient Encounter Prescriptions as of 11/03/2016  Medication Sig  . Aspirin (ASPIR-81 PO) Take 1 tablet by mouth.  Marland Kitchen b complex vitamins tablet Take 1 tablet by mouth daily.  . Calcium Citrate-Vitamin D (CALCIUM CITRATE + PO) Take 1 tablet by mouth daily.  . Cholecalciferol (VITAMIN D3) 2000 UNITS TABS Take 1 capsule by mouth 2 (two) times daily.  . Coenzyme Q10 (COQ10) 100 MG CAPS Take 1 capsule by mouth daily.  . furosemide (LASIX) 20 MG tablet Take 1 tablet (20 mg total) by mouth daily.  . metoprolol tartrate (LOPRESSOR) 25 MG tablet Take 1 tablet (25 mg total) by mouth 2 (two) times daily.  . Misc Natural Products (GLUCOSAMINE CHOND MSM FORMULA PO) Take 1,500 mg by mouth.  . Multiple Vitamin (MULTI VITAMIN DAILY PO) Take 1 tablet by mouth.  . Omega-3 Fatty Acids (FISH OIL) 1200 MG CAPS Take 1 capsule by mouth.  . rosuvastatin (  CRESTOR) 10 MG tablet Take 10 mg by mouth 2 (two) times a week. Monday, Thursday.   No facility-administered encounter medications on file as of 11/03/2016.     Activities of Daily Living In your present state of health, do you have any difficulty performing the following activities: 11/03/2016 09/29/2016  Hearing? N Y  Vision? N N  Difficulty concentrating or making decisions? N N  Walking or climbing stairs? Y N  Dressing or bathing? N N  Doing errands, shopping? N Y  Conservation officer, nature and eating ? N -  Using the Toilet? N -  In the past six months, have you accidently leaked urine? N -  Do you have problems with loss of bowel control? N -  Managing your Medications? N -  Managing your Finances? N -  Housekeeping or managing your Housekeeping? N -  Some recent data might be hidden    Patient Care Team: Jinny Sanders,  MD as PCP - General Nicholas Lose, MD as Consulting Physician (Hematology and Oncology) Gaynelle Arabian, MD as Consulting Physician (Orthopedic Surgery) Emily Filbert, MD as Consulting Physician (Obstetrics and Gynecology) Lupita Raider, DO as Referring Physician (Optometry) Knox Royalty, RN as [facility], Aura Fey, FNP as Nurse Practitioner (Family Medicine) Teodoro Spray, MD as Consulting Physician (Cardiology)    Assessment:     Hearing Screening   125Hz  250Hz  500Hz  1000Hz  2000Hz  3000Hz  4000Hz  6000Hz  8000Hz   Right ear:   40 0 40  40    Left ear:   40 0 40  0    Vision Screening Comments: Last vision exam in August 2017 @ [facility]   Exercise Activities and Dietary recommendations Current Exercise Habits: Structured exercise class, Type of exercise: Other - see comments (Heart Track), Time (Minutes): 60, Frequency (Times/Week): 2, Weekly Exercise (Minutes/Week): 120, Intensity: Mild, Exercise limited by: cardiac condition(s)  Goals    . Increase physical activity          When released by cardiologist, I will continue to go [employer] for 30 min 3 days per week in an effort to lose weight.       Fall Risk Fall Risk  11/03/2016 10/24/2016 10/20/2016 10/16/2016 10/09/2016  Falls in the past year? No (No Data) No (No Data) (No Data)  Risk for fall due to : - - - (No Data) -  Risk for fall due to (comments): - - - No fall risks identified today; general fall pevention discussed with patient today -   Depression Screen PHQ 2/9 Scores 11/03/2016 10/20/2016 10/16/2016 09/29/2016  PHQ - 2 Score 0 0 0 0  PHQ- 9 Score - 2 - -     Cognitive Function MMSE - Mini Mental State Exam 11/03/2016 10/11/2015  Orientation to time 5 5  Orientation to Place 5 5  Registration 3 3  Attention/ Calculation 0 0  Recall 3 3  Language- name 2 objects 0 0  Language- repeat 1 1  Language- follow 3 step command 3 3  Language- read & follow direction 0 0  Write a  sentence 0 0  Copy design 0 0  Total score 20 20     PLEASE NOTE: A Mini-Cog screen was completed. Maximum score is 20. A value of 0 denotes this part of Folstein MMSE was not completed or the patient failed this part of the Mini-Cog screening.   Mini-Cog Screening Orientation to Time - Max 5 pts Orientation to Place -  Max 5 pts Registration - Max 3 pts Recall - Max 3 pts Language Repeat - Max 1 pts Language Follow 3 Step Command - Max 3 pts     Immunization History  Administered Date(s) Administered  . Influenza Split 02/19/2011, 02/23/2012  . Influenza Whole 02/10/2007, 02/15/2008, 03/06/2010  . Influenza,inj,Quad PF,36+ Mos 02/20/2014  . Influenza-Unspecified 02/05/2016  . Pneumococcal Conjugate-13 07/04/2014  . Pneumococcal Polysaccharide-23 07/11/2008  . Td 09/09/2000  . Tdap 11/11/2011  . Zoster 11/14/2009   Screening Tests Health Maintenance  Topic Date Due  . INFLUENZA VACCINE  12/10/2016  . MAMMOGRAM  08/29/2018  . TETANUS/TDAP  11/10/2021  . COLONOSCOPY  10/11/2023  . DEXA SCAN  Completed  . PNA vac Low Risk Adult  Completed      Plan:     I have personally reviewed and addressed the Medicare Annual Wellness questionnaire and have noted the following in the patient's chart:  A. Medical and social history B. Use of alcohol, tobacco or illicit drugs  C. Current medications and supplements D. Functional ability and status E.  Nutritional status F.  Physical activity G. Advance directives H. List of other physicians I.  Hospitalizations, surgeries, and ER visits in previous 12 months J.  [place] to include hearing, vision, cognitive, depression L. Referrals and appointments - none  In addition, I have reviewed and discussed with patient certain preventive protocols, quality metrics, and best practice recommendations. A written personalized care plan for preventive services as well as general preventive health recommendations were provided to  patient.  See attached scanned questionnaire for additional information.   Signed,   Lindell Noe, MHA, BS, LPN Health Coach   Lindell Noe, [place]  2/41/9914

## 2016-11-03 NOTE — Patient Instructions (Signed)
Darlene Lawrence , Thank you for taking time to come for your Medicare Wellness Visit. I appreciate your ongoing commitment to your health goals. Please review the following plan we discussed and let me know if I can assist you in the future.   These are the goals we discussed: Goals    . Increase physical activity          When released by cardiologist, I will continue to go [employer] for 30 min 3 days per week in an effort to lose weight.        This is a list of the screening recommended for you and due dates:  Health Maintenance  Topic Date Due  . Flu Shot  12/10/2016  . Mammogram  08/29/2018  . Tetanus Vaccine  11/10/2021  . Colon Cancer Screening  10/11/2023  . DEXA scan (bone density measurement)  Completed  . Pneumonia vaccines  Completed   Preventive Care for Adults  A healthy lifestyle and preventive care can promote health and wellness. Preventive health guidelines for adults include the following key practices.  . A routine yearly physical is a good way to check with your health care provider about your health and preventive screening. It is a chance to share any concerns and updates on your health and to receive a thorough exam.  . Visit your dentist for a routine exam and preventive care every 6 months. Brush your teeth twice a day and floss once a day. Good oral hygiene prevents tooth decay and gum disease.  . The frequency of eye exams is based on your age, health, family medical history, use  of contact lenses, and other factors. Follow your health care provider's ecommendations for frequency of eye exams.  . Eat a healthy diet. Foods like vegetables, fruits, whole grains, low-fat dairy products, and lean protein foods contain the nutrients you need without too many calories. Decrease your intake of foods high in solid fats, added sugars, and salt. Eat the right amount of calories for you. Get information about a proper diet from your health care provider, if  necessary.  . Regular physical exercise is one of the most important things you can do for your health. Most adults should get at least 150 minutes of moderate-intensity exercise (any activity that increases your heart rate and causes you to sweat) each week. In addition, most adults need muscle-strengthening exercises on 2 or more days a week.  Silver Sneakers may be a benefit available to you. To determine eligibility, you may visit the website: www.silversneakers.com or contact program at 424-548-5495 Mon-Fri between 8AM-8PM.   . Maintain a healthy weight. The body mass index (BMI) is a screening tool to identify possible weight problems. It provides an estimate of body fat based on height and weight. Your health care provider can find your BMI and can help you achieve or maintain a healthy weight.   For adults 20 years and older: ? A BMI below 18.5 is considered underweight. ? A BMI of 18.5 to 24.9 is normal. ? A BMI of 25 to 29.9 is considered overweight. ? A BMI of 30 and above is considered obese.   . Maintain normal blood lipids and cholesterol levels by exercising and minimizing your intake of saturated fat. Eat a balanced diet with plenty of fruit and vegetables. Blood tests for lipids and cholesterol should begin at age 60 and be repeated every 5 years. If your lipid or cholesterol levels are high, you are over 50, or you  are at high risk for heart disease, you may need your cholesterol levels checked more frequently. Ongoing high lipid and cholesterol levels should be treated with medicines if diet and exercise are not working.  . If you smoke, find out from your health care provider how to quit. If you do not use tobacco, please do not start.  . If you choose to drink alcohol, please do not consume more than 2 drinks per day. One drink is considered to be 12 ounces (355 mL) of beer, 5 ounces (148 mL) of wine, or 1.5 ounces (44 mL) of liquor.  . If you are 66-51 years old, ask your  health care provider if you should take aspirin to prevent strokes.  . Use sunscreen. Apply sunscreen liberally and repeatedly throughout the day. You should seek shade when your shadow is shorter than you. Protect yourself by wearing long sleeves, pants, a wide-brimmed hat, and sunglasses year round, whenever you are outdoors.  . Once a month, do a whole body skin exam, using a mirror to look at the skin on your back. Tell your health care provider of new moles, moles that have irregular borders, moles that are larger than a pencil eraser, or moles that have changed in shape or color.

## 2016-11-03 NOTE — Progress Notes (Signed)
I reviewed health advisor's note, was available for consultation, and agree with documentation and plan.   Signed,  Arias Weinert T. Rainah Kirshner, MD  

## 2016-11-03 NOTE — Progress Notes (Signed)
Pre visit review using our clinic review tool, if applicable. No additional management support is needed unless otherwise documented below in the visit note. 

## 2016-11-04 ENCOUNTER — Encounter: Payer: Medicare Other | Admitting: *Deleted

## 2016-11-04 ENCOUNTER — Ambulatory Visit: Payer: Medicare Other | Admitting: Family Medicine

## 2016-11-04 DIAGNOSIS — I214 Non-ST elevation (NSTEMI) myocardial infarction: Secondary | ICD-10-CM

## 2016-11-04 DIAGNOSIS — Z79899 Other long term (current) drug therapy: Secondary | ICD-10-CM | POA: Diagnosis not present

## 2016-11-04 NOTE — Progress Notes (Signed)
Daily Session Note  Patient Details  Name: Darlene Lawrence MRN: [id number] Date of Birth: [birth year] Referring Provider:     Cardiac Rehab from 10/20/2016 in [facility]  Referring Provider  Bartholome Bill MD      Encounter Date: 11/04/2016  Check In:     Session Check In - 11/04/16 0812      Check-In   Location [facility]-Cardiac & Pulmonary Rehab   Staff Present Alberteen Sam, MA, ACSM RCEP, Exercise Physiologist;Laureen Owens Shark, BS, RRT, Respiratory Therapist;Susanne Bice, RN, BSN, CCRP   Supervising physician immediately available to respond to emergencies See telemetry face sheet for immediately available ER MD   Medication changes reported     No   Fall or balance concerns reported    No   Warm-up and Cool-down Performed on first and last piece of equipment   Resistance Training Performed Yes   VAD Patient? No     Pain Assessment   Currently in Pain? No/denies   Multiple Pain Sites No         History  Smoking Status  . Never Smoker  Smokeless Tobacco  . Never Used    Goals Met:  Independence with exercise equipment Exercise tolerated well No report of cardiac concerns or symptoms Strength training completed today  Goals Unmet:  Not Applicable  Comments: Pt able to follow exercise prescription today without complaint.  Will continue to monitor for progression. Reviewed home exercise with pt today.  Pt plans to walk and go to [employer] for exercise. She also has access to a fitness center within her complex.   Reviewed THR, pulse, RPE, sign and symptoms, and when to call 911 or MD.  Also discussed weather considerations and indoor options.  Pt voiced understanding.    Dr. Emily Filbert is Medical Director for [facility].

## 2016-11-06 ENCOUNTER — Encounter: Payer: Self-pay | Admitting: Family

## 2016-11-06 ENCOUNTER — Encounter: Payer: Medicare Other | Admitting: *Deleted

## 2016-11-06 ENCOUNTER — Ambulatory Visit: Payer: Medicare Other | Attending: Family | Admitting: Family

## 2016-11-06 VITALS — BP 107/55 | HR 48 | Resp 18 | Ht 66.0 in | Wt 211.5 lb

## 2016-11-06 DIAGNOSIS — I110 Hypertensive heart disease with heart failure: Secondary | ICD-10-CM | POA: Diagnosis not present

## 2016-11-06 DIAGNOSIS — Z9889 Other specified postprocedural states: Secondary | ICD-10-CM | POA: Diagnosis not present

## 2016-11-06 DIAGNOSIS — I5032 Chronic diastolic (congestive) heart failure: Secondary | ICD-10-CM | POA: Insufficient documentation

## 2016-11-06 DIAGNOSIS — Z8279 Family history of other congenital malformations, deformations and chromosomal abnormalities: Secondary | ICD-10-CM | POA: Diagnosis not present

## 2016-11-06 DIAGNOSIS — Z881 Allergy status to other antibiotic agents status: Secondary | ICD-10-CM | POA: Insufficient documentation

## 2016-11-06 DIAGNOSIS — I4510 Unspecified right bundle-branch block: Secondary | ICD-10-CM | POA: Insufficient documentation

## 2016-11-06 DIAGNOSIS — Z9851 Tubal ligation status: Secondary | ICD-10-CM | POA: Diagnosis not present

## 2016-11-06 DIAGNOSIS — I10 Essential (primary) hypertension: Secondary | ICD-10-CM

## 2016-11-06 DIAGNOSIS — M1990 Unspecified osteoarthritis, unspecified site: Secondary | ICD-10-CM | POA: Diagnosis not present

## 2016-11-06 DIAGNOSIS — Z853 Personal history of malignant neoplasm of breast: Secondary | ICD-10-CM | POA: Insufficient documentation

## 2016-11-06 DIAGNOSIS — Z803 Family history of malignant neoplasm of breast: Secondary | ICD-10-CM | POA: Insufficient documentation

## 2016-11-06 DIAGNOSIS — I214 Non-ST elevation (NSTEMI) myocardial infarction: Secondary | ICD-10-CM | POA: Diagnosis not present

## 2016-11-06 DIAGNOSIS — Z880 Allergy status to penicillin: Secondary | ICD-10-CM | POA: Diagnosis not present

## 2016-11-06 DIAGNOSIS — Z8249 Family history of ischemic heart disease and other diseases of the circulatory system: Secondary | ICD-10-CM | POA: Diagnosis not present

## 2016-11-06 DIAGNOSIS — I5030 Unspecified diastolic (congestive) heart failure: Secondary | ICD-10-CM

## 2016-11-06 DIAGNOSIS — I252 Old myocardial infarction: Secondary | ICD-10-CM | POA: Insufficient documentation

## 2016-11-06 DIAGNOSIS — M5136 Other intervertebral disc degeneration, lumbar region: Secondary | ICD-10-CM | POA: Insufficient documentation

## 2016-11-06 DIAGNOSIS — Z7982 Long term (current) use of aspirin: Secondary | ICD-10-CM | POA: Diagnosis not present

## 2016-11-06 DIAGNOSIS — Z96651 Presence of right artificial knee joint: Secondary | ICD-10-CM | POA: Insufficient documentation

## 2016-11-06 DIAGNOSIS — E7800 Pure hypercholesterolemia, unspecified: Secondary | ICD-10-CM | POA: Insufficient documentation

## 2016-11-06 DIAGNOSIS — Z96642 Presence of left artificial hip joint: Secondary | ICD-10-CM | POA: Insufficient documentation

## 2016-11-06 DIAGNOSIS — Z825 Family history of asthma and other chronic lower respiratory diseases: Secondary | ICD-10-CM | POA: Insufficient documentation

## 2016-11-06 DIAGNOSIS — Z955 Presence of coronary angioplasty implant and graft: Secondary | ICD-10-CM | POA: Diagnosis not present

## 2016-11-06 DIAGNOSIS — Z79899 Other long term (current) drug therapy: Secondary | ICD-10-CM | POA: Diagnosis not present

## 2016-11-06 NOTE — Progress Notes (Signed)
Daily Session Note  Patient Details  Name: Darlene Lawrence MRN: [id number] Date of Birth: 28-Jul-1940 Referring Provider:     Cardiac Rehab from 10/20/2016 in [facility] Cardiac and Pulmonary Rehab  Referring Provider  Bartholome Bill MD      Encounter Date: 11/06/2016  Check In:     Session Check In - 11/06/16 0919      Check-In   Location [facility] Pul[facility]   Staff Present Nyoka Cowden, RN, BSN, MA;Krissia Schreier Sherryll Burger, RN BSN;Jessica Luan Pulling, MA, ACSM RCEP, Exercise Physiologist   Supervising physician immediately available to respond to emergencies See telemetry face sheet for immediately available ER MD   Medication changes reported     No   Fall or balance concerns reported    No   Warm-up and Cool-down Performed on first and last piece of equipment   Resistance Training Performed Yes   VAD Patient? No     Pain Assessment   Currently in Pain? No/denies         History  Smoking Status  . Never Smoker  Smokeless Tobacco  . Never Used    Goals Met:  Proper associated with RPD/PD & O2 Sat Independence with exercise equipment Exercise tolerated well Personal goals reviewed No report of cardiac concerns or symptoms Strength training completed today  Goals Unmet:  Not Applicable  Comments: Pt able to follow exercise prescription today without complaint.  Will continue to monitor for progression.    Dr. Emily Filbert is Medical Director for [place] and [facility].

## 2016-11-06 NOTE — Progress Notes (Signed)
Patient ID: Darlene Lawrence, female    DOB: 15-Nov-1940, 76 y.o.   MRN: [id number]  HPI  Darlene Lawrence is a 76 y/o female with a history of arthritis, breast cancer, hyperlipidemia, HTN, radiation therapy and chronic heart failure.   Reviewed last echo report done on 09/12/16 which showed an EF of 40-45%. Cardiac catheterization was done 09/17/16 which showed no coronary artery disease.   Admitted 09/12/16 with pneumonia and NSTEMI. Given IV antibiotics and oxygen which were still required at discharge. Cardiology consult obtained and catheterization was done showing normal coronary arteries. Referral made to cardiac rehab. Discharged to home.   Darlene Lawrence presents today for her follow-up visit without a chief complaint. Darlene Lawrence denies fatigue, shortness of breath, chest pain, cough or weight gain.   Past Medical History:  Diagnosis Date  . Abnormal Pap smear   . Arthritis   . breast cancer 2009   right  . Cancer ([place])   . CHF (congestive heart failure) ([place])   . DDD (degenerative disc disease), lumbar   . High cholesterol   . Hyperlipidemia   . Hypertension   . Meniscus tear    right  . Personal history of radiation therapy   . RBBB    Past Surgical History:  Procedure Laterality Date  . BREAST BIOPSY Right 08/18/2007  . BREAST LUMPECTOMY Right 2009  . BREAST SURGERY Right 2009   lumpectomy, no radiation, no chemo  . DILATION AND CURETTAGE OF UTERUS  2012   Dr. Fermin Schwab  . HERNIA REPAIR     umbilical  . JOINT REPLACEMENT Left    hip  . LEFT HEART CATH AND CORONARY ANGIOGRAPHY N/A 09/17/2016   Procedure: Left Heart Cath and Coronary Angiography;  Surgeon: Teodoro Spray, MD;  Location: [facility];  Service: Cardiovascular;  Laterality: N/A;  . TOTAL HIP ARTHROPLASTY Left   . 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 TUBAL LIGATION      Family History  Problem Relation Age of Onset  . Cancer Mother        breast and colon  . Breast cancer Mother   . Hypertension Father   . Heart disease Father   . Thyroid disease Father   . Down syndrome Son   . Asthma Other    Social History  Substance Use Topics  . Smoking status: Never Smoker  . Smokeless tobacco: Never Used  . Alcohol use Yes     Comment: once a month   Allergies  Allergen Reactions  . Cefixime     REACTION: Rash  . Levaquin [Levofloxacin] Other (See Comments)    Shoulder Bursitis  . Penicillins     REACTION: Rash   Prior to Admission medications   Medication Sig Start Date End Date Taking? Authorizing Provider  Aspirin (ASPIR-81 PO) Take 1 tablet by mouth.   Yes [provider]  b complex vitamins tablet Take 1 tablet by mouth daily.   Yes [provider]  Calcium Citrate-Vitamin D (CALCIUM CITRATE + PO) Take 1 tablet by mouth daily.   Yes [provider]  Cholecalciferol (VITAMIN D3) 2000 UNITS TABS Take 1 capsule by mouth 2 (two) times daily.   Yes [provider]  Coenzyme Q10 (COQ10) 100 MG CAPS Take 1 capsule by mouth daily.   Yes [provider]  feeding supplement, ENSURE  ENLIVE, (ENSURE ENLIVE) LIQD Take 237 mLs by mouth daily. 09/22/16 10/23/16 Yes Mody, Ulice Bold, MD  furosemide (LASIX) 20 MG tablet Take 1 tablet (20 mg total) by mouth daily. 09/22/16  Yes Bettey Costa, MD  metoprolol tartrate (LOPRESSOR) 25 MG tablet Take 1 tablet (25 mg total) by mouth 2 (two) times daily. 09/22/16  Yes Mody, Ulice Bold, MD  Misc Natural Products (GLUCOSAMINE CHOND MSM FORMULA PO) Take 1,500 mg by mouth.   Yes [provider]  Multiple Vitamin (MULTI VITAMIN DAILY PO) Take 1 tablet by mouth.   Yes [provider]  Omega-3 Fatty Acids (FISH OIL) 1200 MG CAPS Take 1 capsule by mouth.   Yes [provider]  rosuvastatin (CRESTOR) 10 MG tablet Take 10 mg by mouth 2 (two) times a week. Monday, Thursday.  11/05/10  Yes Bedsole, Amy E, MD    Review of Systems  Constitutional: Negative for appetite change and fatigue.  HENT: Negative for congestion, postnasal drip and sore throat.   Eyes: Negative.   Respiratory: Negative for cough, chest tightness and shortness of breath.   Cardiovascular: Negative for chest pain, palpitations and leg swelling.  Gastrointestinal: Negative for abdominal distention and abdominal pain.  Endocrine: Negative.   Genitourinary: Negative.   Musculoskeletal: Negative for back pain and neck pain.  Skin: Negative.   Allergic/Immunologic: Negative.   Neurological: Negative for dizziness and light-headedness.  Hematological: Negative for adenopathy. Bruises/bleeds easily.  Psychiatric/Behavioral: Negative for dysphoric mood and sleep disturbance (sleeping well on 1 pillow). The patient is not nervous/anxious.    Vitals:   11/06/16 0947  BP: (!) 107/55  Pulse: (!) 48  Resp: 18  SpO2: 99%  Weight: 211 lb 8 oz (95.9 kg)  Height: 5\' 6"  (1.676 m)   Wt Readings from Last 3 Encounters:  11/06/16 211 lb 8 oz (95.9 kg)  11/03/16 211 lb 12 oz (96 kg)  10/20/16 211 lb 6.4 oz (95.9 kg)   Lab Results  Component Value Date   CREATININE 0.78 09/19/2016   CREATININE 0.70 09/18/2016   CREATININE 0.82 09/17/2016    Physical Exam  Constitutional: Darlene Lawrence is oriented to person, place, and time. Darlene Lawrence appears well-developed and well-nourished.  HENT:  Head: Normocephalic and atraumatic.  Neck: Normal range of motion. Neck supple. No JVD present.  Cardiovascular: Regular rhythm.  Bradycardia present.   Pulmonary/Chest: Effort normal. Darlene Lawrence has no wheezes. Darlene Lawrence has no rales.  Abdominal: Soft. Darlene Lawrence exhibits no distension. There is no tenderness.  Musculoskeletal: Darlene Lawrence exhibits no edema or tenderness.  Neurological: Darlene Lawrence is alert and oriented to person, place, and time.  Skin: Skin is warm and dry.  Psychiatric: Darlene Lawrence has a normal mood and affect. Her behavior is normal. Thought content  normal.  Nursing note and vitals reviewed.    Assessment & Plan:  1: Chronic heart failure with preserved ejection fraction- - NYHA class I - euvolemic - already weighing daily. Discussed calling for an overnight weight gain of >2 pounds or a weekly weight gain of >5 pounds - not adding salt and reading food labels "some". Discussed the importance of closely following a 2000mg  sodium diet  - saw cardiologist Ubaldo Glassing) 09/29/16 - participating in cardiac rehab twice weekly - reviewed BMP drawn on 09/18/16; potassium 4.0 and GFR >60  2: HTN- - BP looks good today - continue medications - saw PCP Diona Browner) on 09/30/16   Medication list was reviewed.  Patient opts to not make a return appointment at this time. Advised patient that Darlene Lawrence could  call back at anytime to schedule an appointment.

## 2016-11-06 NOTE — Patient Instructions (Signed)
Continue weighing daily and call for an overnight weight gain of > 2 pounds or a weekly weight gain of >5 pounds. 

## 2016-11-07 ENCOUNTER — Encounter: Payer: Self-pay | Admitting: Family Medicine

## 2016-11-07 ENCOUNTER — Ambulatory Visit (INDEPENDENT_AMBULATORY_CARE_PROVIDER_SITE_OTHER): Payer: Medicare Other | Admitting: Family Medicine

## 2016-11-07 ENCOUNTER — Other Ambulatory Visit: Payer: Self-pay | Admitting: *Deleted

## 2016-11-07 ENCOUNTER — Encounter: Payer: Self-pay | Admitting: *Deleted

## 2016-11-07 VITALS — BP 130/72 | HR 57 | Temp 98.6°F | Ht 64.25 in | Wt 213.0 lb

## 2016-11-07 DIAGNOSIS — M5033 Other cervical disc degeneration, cervicothoracic region: Secondary | ICD-10-CM | POA: Diagnosis not present

## 2016-11-07 DIAGNOSIS — R109 Unspecified abdominal pain: Secondary | ICD-10-CM

## 2016-11-07 DIAGNOSIS — I10 Essential (primary) hypertension: Secondary | ICD-10-CM

## 2016-11-07 DIAGNOSIS — D696 Thrombocytopenia, unspecified: Secondary | ICD-10-CM | POA: Diagnosis not present

## 2016-11-07 DIAGNOSIS — M9902 Segmental and somatic dysfunction of thoracic region: Secondary | ICD-10-CM | POA: Diagnosis not present

## 2016-11-07 DIAGNOSIS — E782 Mixed hyperlipidemia: Secondary | ICD-10-CM | POA: Diagnosis not present

## 2016-11-07 DIAGNOSIS — M62830 Muscle spasm of back: Secondary | ICD-10-CM | POA: Diagnosis not present

## 2016-11-07 DIAGNOSIS — R10A1 Flank pain, right side: Secondary | ICD-10-CM | POA: Insufficient documentation

## 2016-11-07 DIAGNOSIS — N183 Chronic kidney disease, stage 3 unspecified: Secondary | ICD-10-CM

## 2016-11-07 DIAGNOSIS — Z0000 Encounter for general adult medical examination without abnormal findings: Secondary | ICD-10-CM

## 2016-11-07 DIAGNOSIS — M9901 Segmental and somatic dysfunction of cervical region: Secondary | ICD-10-CM | POA: Diagnosis not present

## 2016-11-07 LAB — POCT UA - MICROSCOPIC ONLY

## 2016-11-07 LAB — POC URINALSYSI DIPSTICK (AUTOMATED)
Bilirubin, UA: NEGATIVE
Blood, UA: NEGATIVE
GLUCOSE UA: NEGATIVE
Ketones, UA: NEGATIVE
NITRITE UA: NEGATIVE
Spec Grav, UA: 1.02 (ref 1.010–1.025)
Urobilinogen, UA: 0.2 E.U./dL
pH, UA: 6 (ref 5.0–8.0)

## 2016-11-07 NOTE — Progress Notes (Signed)
Subjective:    Patient ID: Darlene Lawrence, female    DOB: 1940-11-05, 76 y.o.   MRN: [id number]  HPI The patient presents for  complete physical and review of chronic health problems. He/She also has the following acute concerns today: Has noted soreness 2/10 on pain scale in right mid back . Off and on when moves or in bed. No dysuria  no Cough No SOB.  The patient saw Candis Musa, LPN for medicare wellness. Note reviewed in detail and important notes copied below.  PCP notes: Health maintenance: No gaps identified. Abnormal screenings:  Hearing - failed Patient concerns:  Pt is recovering from recent hospitalization and is currently participating in cardiac rehab twice weekly.   Hospital admisson 5/4-5/14/2018 for PNA resulting in NSTEMI and  CHF with reduced EF  Now on crestor 10 mg daily, asa 81 mg, lasix 20 mg daily. BBlocker lopressor 25 mg BID  Euvolemic  In cardiac rehab 2 times a week.. elping with energy and stamina.   Hypertension:   Good control on  Current meds.  Using medication without problems or lightheadedness:  None  Chest pain with exertion: none Edema: none Short of breath: none Average home BPs: good control Other issues:   LDL 62 on crestor   CKD, recent ARF resolved at last check in [facility] on 09/19/2016    .Social History /Family History/Past Medical History reviewed in detail and updated in EMR if needed. Blood pressure 130/72, pulse (!) 57, temperature 98.6 F (37 C), temperature source Oral, height 5' 4.25" (1.632 m), weight 213 lb (96.6 kg), SpO2 93 %.    Review of Systems  Constitutional: Negative for fatigue and fever.  HENT: Negative for congestion.   Eyes: Negative for pain.  Respiratory: Negative for cough and shortness of breath.   Cardiovascular: Negative for chest pain, palpitations and leg swelling.  Gastrointestinal: Negative for abdominal pain.  Genitourinary: Negative for dysuria and vaginal bleeding.    Musculoskeletal: Negative for back pain.  Neurological: Negative for syncope, light-headedness and headaches.  Psychiatric/Behavioral: Negative for dysphoric mood.       Objective:   Physical Exam  Constitutional: Vital signs are normal. She appears well-developed and well-nourished. She is cooperative.  Non-toxic appearance. She does not appear ill. No distress.  HENT:  Head: Normocephalic.  Right Ear: Hearing, tympanic membrane, external ear and ear canal normal. Tympanic membrane is not erythematous, not retracted and not bulging.  Left Ear: Hearing, tympanic membrane, external ear and ear canal normal. Tympanic membrane is not erythematous, not retracted and not bulging.  Nose: Nose normal. No mucosal edema or rhinorrhea. Right sinus exhibits no maxillary sinus tenderness and no frontal sinus tenderness. Left sinus exhibits no maxillary sinus tenderness and no frontal sinus tenderness.  Mouth/Throat: Uvula is midline, oropharynx is clear and moist and mucous membranes are normal.  Eyes: Conjunctivae, EOM and lids are normal. Pupils are equal, round, and reactive to light. Lids are everted and swept, no foreign bodies found.  Neck: Trachea normal and normal range of motion. Neck supple. Carotid bruit is not present. No thyroid mass and no thyromegaly present.  Cardiovascular: Normal rate, regular rhythm, S1 normal, S2 normal, normal heart sounds, intact distal pulses and normal pulses.  Exam reveals no gallop and no friction rub.   No murmur heard. Pulmonary/Chest: Effort normal and breath sounds normal. No tachypnea. No respiratory distress. She has no decreased breath sounds. She has no wheezes. She has no rhonchi. She has no rales.  Abdominal: Soft. Normal appearance and bowel sounds are normal. She exhibits no distension, no fluid wave, no abdominal bruit and no mass. There is no hepatosplenomegaly. There is no tenderness. There is no rebound, no guarding and no CVA tenderness. No  hernia.    Lymphadenopathy:    She has no cervical adenopathy.    She has no axillary adenopathy.  Neurological: She is alert. She has normal strength. No cranial nerve deficit or sensory deficit.  Skin: Skin is warm, dry and intact. No rash noted.  Psychiatric: Her speech is normal and behavior is normal. Judgment and thought content normal. Her mood appears not anxious. Cognition and memory are normal. She does not exhibit a depressed mood.          Assessment & Plan:  The patient's preventative maintenance and recommended screening tests for an annual wellness exam were reviewed in full today. Brought up to date unless services declined.  Counselled on the importance of diet, exercise, and its role in overall health and mortality. The patient's FH and SH was reviewed, including their home life, tobacco status, and drug and alcohol status.   Vaccines: Uptodate . PAP/DVE: Per Dr. Hulan Fray.  Following uterine prolapse. Decided to not move ahead with hysterectomy given SE risks. Breast exam/mammogram: per [facility] Colonoscopy: Dr. Cristina Gong 10/2013 polyps repeat in 5 years. Nonsmoker  DEXA:  osteopenia in 2016, repeat in 2-5 years.

## 2016-11-07 NOTE — Assessment & Plan Note (Signed)
Stable during hospitalization 

## 2016-11-07 NOTE — Assessment & Plan Note (Signed)
At goal on crestor 10

## 2016-11-07 NOTE — Patient Outreach (Signed)
[facility]) Care Management [place] Telephone Outreach  11/07/2016  Darlene Lawrence 1941-01-05 [id number]  Successful telephone outreach to Darlene Lawrence, 76 y.o.femalereferred to [facility] for transition of care after hospitalization May 4-14, 2018 for CAP/ CHF exacerbation andNSTEMI. Patient was discharged home with home health [facility]) services which are now complete. Patient has history including, but not limited to, breast cancer, HTN, HLD, (R) BBB, and CKD stage III. HIPAA/ identity verified with patient during phone calltoday.   Today, patient continues to report that she "is doing very good."Patient is in no distress throughout entirety of phone calltoday.   Reports:  -- Attendeding cardiac rehab twice a week, "enjoying" driving herself to cardiac rehab and provider appointments.  -- Has all medicationsand takes as prescribed;denies questions/ concerns around current medications.   -- Provider appointments: Has attended all scheduled provider appointments post-hospital discharge. Verbalizes plans to attend all upcoming scheduled appointments.   -- Safety/ Mobility/ Falls: Denies new falls.   -- Self-health management of chronic disease state of CHF: Reports that she has continued monitoring/ recording daily weights, again reports weight today of 208lbs, states, "that is where I stay pretty much all the time."  Verbalizes accurate understanding to contact provider for weight gain of 3pounds overnight, 5 pounds in one week. Denies issues/ concernswith shortness of breath, LE swelling. Patient reports in "green zone" today, "have been in green zone all month."  Patient denies further issues, concerns, or problems today. Reports she will be going on family vacation to [place] July 12-20, 2018; discussed my general schedule for July with patient and we agreed that I would contact patient again by phone at the end of July.   We discussed today that patient has thus far met her previously established [facility] CCM goals, and that she may be ready for discharge from [place], which patient agrees with.  I confirmed that patient hasmy direct phone number, the main [facility] CM office phone number, and the [facility] CM 24-hour nurse advice phone number should issues arise prior to next scheduled [place] outreach with scheduled phone call.  Plan:  Patient will take medications as prescribed and will attend all scheduled provider appointments.  Patient will continue monitoring and recording daily weights, and will report weight gain (>3lbs overnight/ 5 lbs in one week) to providers.  Patient will promptly notify providers of any new concerns, problems, or issues post- hospital discharge.  Patient will actively participate in OP cardiac rehabilitation  [place] outreach to continue with scheduled phone call possibly for case closure at the end of July.  Oneta Rack, RN, BSN, [employer] [facility] Care Management  [phone]

## 2016-11-07 NOTE — Assessment & Plan Note (Signed)
Well controlled. Continue current medication.  

## 2016-11-07 NOTE — Assessment & Plan Note (Signed)
Improved

## 2016-11-07 NOTE — Patient Instructions (Signed)
Work on back stretching exercise. Follow up if not improving as expected.

## 2016-11-07 NOTE — Assessment & Plan Note (Addendum)
UA contaminated but doubt UTI. Lung exam clear  Possible MSK strain. Heat stretching. Call if not improving

## 2016-11-10 DIAGNOSIS — E785 Hyperlipidemia, unspecified: Secondary | ICD-10-CM | POA: Diagnosis not present

## 2016-11-10 DIAGNOSIS — N183 Chronic kidney disease, stage 3 (moderate): Secondary | ICD-10-CM | POA: Diagnosis not present

## 2016-11-10 DIAGNOSIS — I10 Essential (primary) hypertension: Secondary | ICD-10-CM | POA: Diagnosis not present

## 2016-11-10 DIAGNOSIS — I5032 Chronic diastolic (congestive) heart failure: Secondary | ICD-10-CM | POA: Diagnosis not present

## 2016-11-10 DIAGNOSIS — I214 Non-ST elevation (NSTEMI) myocardial infarction: Secondary | ICD-10-CM | POA: Diagnosis not present

## 2016-11-11 ENCOUNTER — Encounter: Payer: Medicare Other | Attending: Cardiology | Admitting: *Deleted

## 2016-11-11 DIAGNOSIS — Z79899 Other long term (current) drug therapy: Secondary | ICD-10-CM | POA: Diagnosis not present

## 2016-11-11 DIAGNOSIS — I214 Non-ST elevation (NSTEMI) myocardial infarction: Secondary | ICD-10-CM | POA: Diagnosis not present

## 2016-11-11 NOTE — Progress Notes (Signed)
Daily Session Note  Patient Details  Name: Darlene Lawrence MRN: [id number] Date of Birth: [birth year] Referring Provider:     Cardiac Rehab from 10/20/2016 in [facility] Cardiac and Pulmonary Rehab  Referring Provider  Bartholome Bill MD      Encounter Date: 11/11/2016  Check In:     Session Check In - 11/11/16 0638      Check-In   Location [facility]-Cardiac & Pulmonary Rehab   Staff Present Heath Lark, RN, BSN, CCRP;Jessica Luan Pulling, MA, ACSM RCEP, Exercise Physiologist;Mckennon Zwart Oletta Darter, [place], ACSM CEP, Exercise Physiologist   Supervising physician immediately available to respond to emergencies See telemetry face sheet for immediately available ER MD   Medication changes reported     No   Fall or balance concerns reported    No   Warm-up and Cool-down Performed on first and last piece of equipment   Resistance Training Performed Yes   VAD Patient? No     Pain Assessment   Currently in Pain? No/denies   Multiple Pain Sites No         History  Smoking Status  . Never Smoker  Smokeless Tobacco  . Never Used    Goals Met:  Independence with exercise equipment Exercise tolerated well No report of cardiac concerns or symptoms Strength training completed today  Goals Unmet:  Not Applicable  Comments: Pt able to follow exercise prescription today without complaint.  Will continue to monitor for progression.    Dr. Emily Filbert is Medical Director for [place] and [facility].

## 2016-11-13 ENCOUNTER — Encounter: Payer: Medicare Other | Admitting: *Deleted

## 2016-11-13 DIAGNOSIS — I214 Non-ST elevation (NSTEMI) myocardial infarction: Secondary | ICD-10-CM

## 2016-11-13 DIAGNOSIS — Z79899 Other long term (current) drug therapy: Secondary | ICD-10-CM | POA: Diagnosis not present

## 2016-11-13 NOTE — Progress Notes (Signed)
Daily Session Note  Patient Details  Name: Darlene Lawrence MRN: [id number] Date of Birth: May 29, 1940 Referring Provider:     Cardiac Rehab from 10/20/2016 in [facility] Cardiac and Pulmonary Rehab  Referring Provider  Bartholome Bill MD      Encounter Date: 11/13/2016  Check In:     Session Check In - 11/13/16 0926      Check-In   Location [facility]-Cardiac & Pulmonary Rehab   Staff Present Alberteen Sam, MA, ACSM RCEP, Exercise Physiologist;Amanda Oletta Darter, BA, ACSM CEP, Exercise Physiologist;Meredith Sherryll Burger, RN BSN   Supervising physician immediately available to respond to emergencies See telemetry face sheet for immediately available ER MD   Medication changes reported     No   Fall or balance concerns reported    No   Warm-up and Cool-down Performed on first and last piece of equipment   Resistance Training Performed Yes   VAD Patient? No     Pain Assessment   Currently in Pain? No/denies   Multiple Pain Sites No         History  Smoking Status  . Never Smoker  Smokeless Tobacco  . Never Used    Goals Met:  Independence with exercise equipment Exercise tolerated well No report of cardiac concerns or symptoms Strength training completed today  Goals Unmet:  Not Applicable  Comments: Pt able to follow exercise prescription today without complaint.  Will continue to monitor for progression.    Dr. Emily Filbert is Medical Director for [place] and [facility].

## 2016-11-18 DIAGNOSIS — Z79899 Other long term (current) drug therapy: Secondary | ICD-10-CM | POA: Diagnosis not present

## 2016-11-18 DIAGNOSIS — I214 Non-ST elevation (NSTEMI) myocardial infarction: Secondary | ICD-10-CM

## 2016-11-18 NOTE — Progress Notes (Signed)
Daily Session Note  Patient Details  Name: Darlene Lawrence MRN: [id number] Date of Birth: [birth year] Referring Provider:     Cardiac Rehab from 10/20/2016 in [facility] Cardiac and Pulmonary Rehab  Referring Provider  Bartholome Bill MD      Encounter Date: 11/18/2016  Check In:     Session Check In - 11/18/16 0931      Check-In   Location [facility]-Cardiac & Pulmonary Rehab   Staff Present Heath Lark, RN, BSN, Lance Sell, BA, ACSM CEP, Exercise Physiologist;Krista Frederico Hamman, RN BSN   Supervising physician immediately available to respond to emergencies See telemetry face sheet for immediately available ER MD   Medication changes reported     No   Fall or balance concerns reported    No   Warm-up and Cool-down Performed on first and last piece of equipment   Resistance Training Performed Yes   VAD Patient? No         History  Smoking Status  . Never Smoker  Smokeless Tobacco  . Never Used    Goals Met:  Independence with exercise equipment Exercise tolerated well No report of cardiac concerns or symptoms Strength training completed today  Goals Unmet:  Not Applicable  Comments:  Pt able to exercise today with no issues.     Dr. Emily Filbert is Medical Director for [name].

## 2016-11-19 DIAGNOSIS — M62830 Muscle spasm of back: Secondary | ICD-10-CM | POA: Diagnosis not present

## 2016-11-19 DIAGNOSIS — M9902 Segmental and somatic dysfunction of thoracic region: Secondary | ICD-10-CM | POA: Diagnosis not present

## 2016-11-19 DIAGNOSIS — M9901 Segmental and somatic dysfunction of cervical region: Secondary | ICD-10-CM | POA: Diagnosis not present

## 2016-11-19 DIAGNOSIS — M5033 Other cervical disc degeneration, cervicothoracic region: Secondary | ICD-10-CM | POA: Diagnosis not present

## 2016-11-26 ENCOUNTER — Encounter: Payer: Self-pay | Admitting: *Deleted

## 2016-11-26 DIAGNOSIS — I214 Non-ST elevation (NSTEMI) myocardial infarction: Secondary | ICD-10-CM

## 2016-11-26 NOTE — Progress Notes (Signed)
Cardiac Individual Treatment Plan  Patient Details  Name: Darlene Lawrence MRN: [id number] Date of Birth: 1941-04-24 Referring Provider:     Cardiac Rehab from 10/20/2016 in [facility] Cardiac and Pulmonary Rehab  Referring Provider  Bartholome Bill MD      Initial Encounter Date:    Cardiac Rehab from 10/20/2016 in [facility] Cardiac and Pulmonary Rehab  Date  10/20/16  Referring Provider  Bartholome Bill MD      Visit Diagnosis: NSTEMI (non-ST elevated myocardial infarction) [facility])  Patient's Home Medications on Admission:  Current Outpatient Prescriptions:  .  Aspirin (ASPIR-81 PO), Take 1 tablet by mouth., Disp: , Rfl:  .  b complex vitamins tablet, Take 1 tablet by mouth daily., Disp: , Rfl:  .  Calcium Citrate-Vitamin D (CALCIUM CITRATE + PO), Take 1 tablet by mouth daily., Disp: , Rfl:  .  Cholecalciferol (VITAMIN D3) 2000 UNITS TABS, Take 1 capsule by mouth 2 (two) times daily., Disp: , Rfl:  .  Coenzyme Q10 (COQ10) 100 MG CAPS, Take 1 capsule by mouth daily., Disp: , Rfl:  .  furosemide (LASIX) 20 MG tablet, Take 1 tablet (20 mg total) by mouth daily., Disp: 30 tablet, Rfl: 0 .  metoprolol tartrate (LOPRESSOR) 25 MG tablet, Take 1 tablet (25 mg total) by mouth 2 (two) times daily., Disp: 60 tablet, Rfl: 0 .  Misc Natural Products (GLUCOSAMINE CHOND MSM FORMULA PO), Take 1,500 mg by mouth., Disp: , Rfl:  .  Multiple Vitamin (MULTI VITAMIN DAILY PO), Take 1 tablet by mouth., Disp: , Rfl:  .  Omega-3 Fatty Acids (FISH OIL) 1200 MG CAPS, Take 1 capsule by mouth., Disp: , Rfl:  .  rosuvastatin (CRESTOR) 10 MG tablet, Take 10 mg by mouth 2 (two) times a week. Monday, Thursday., Disp: , Rfl:   Past Medical History: Past Medical History:  Diagnosis Date  . Abnormal Pap smear   . Arthritis   . breast cancer 2009   right  . Cancer ([place])   . CHF (congestive heart failure) ([place])   . DDD (degenerative disc disease), lumbar   . High cholesterol   . Hyperlipidemia   . Hypertension   . Meniscus  tear    right  . Personal history of radiation therapy   . RBBB     Tobacco Use: History  Smoking Status  . Never Smoker  Smokeless Tobacco  . Never Used    Labs: Recent Review Flowsheet Data    Labs for ITP Cardiac and Pulmonary Rehab Latest Ref Rng & Units 06/27/2014 10/11/2015 09/13/2016 09/13/2016 09/13/2016   Cholestrol 0 - 200 mg/dL 169 150 114 - -   LDLCALC 0 - 99 mg/dL 94 80 62 - -   HDL >40 mg/dL 47.60 43.50 26(L) - -   Trlycerides <150 mg/dL 136.0 133.0 129 - -   PHART 7.350 - 7.450 - - - 7.48(H) 7.53(H)   PCO2ART 32.0 - 48.0 mmHg - - - 34 31(L)   HCO3 20.0 - 28.0 mmol/L - - - 25.3 25.9   O2SAT % - - - 87.9 98.2       Exercise Target Goals:    Exercise Program Goal: Individual exercise prescription set with THRR, safety & activity barriers. Participant demonstrates ability to understand and report RPE using BORG scale, to self-measure pulse accurately, and to acknowledge the importance of the exercise prescription.  Exercise Prescription Goal: Starting with aerobic activity 30 plus minutes a day, 3 days per week for initial exercise prescription. Provide home  exercise prescription and guidelines that participant acknowledges understanding prior to discharge.  Activity Barriers & Risk Stratification:     Activity Barriers & Cardiac Risk Stratification - 10/20/16 1415      Activity Barriers & Cardiac Risk Stratification   Activity Barriers Left Hip Replacement;Right Knee Replacement;Back Problems;Deconditioning;Muscular Weakness;Shortness of Breath;Other (comment);Balance Concerns   Comments menicus tear, back pain after doing some housework   Cardiac Risk Stratification High      6 Minute Walk:     6 Minute Walk    Row Name 10/20/16 1454         6 Minute Walk   Phase Initial     Distance 1060 feet     Walk Time 6 minutes     # of Rest Breaks 0     MPH 2     METS 1.79     RPE 13     Perceived Dyspnea  1     VO2 Peak 6.28     Symptoms Yes (comment)      Comments slightly SOB, no pain     Resting HR 44 bpm     Resting BP 126/64     Max Ex. HR 93 bpm     Max Ex. BP 132/66     2 Minute Post BP 126/70        Oxygen Initial Assessment:   Oxygen Re-Evaluation:   Oxygen Discharge (Final Oxygen Re-Evaluation):   Initial Exercise Prescription:     Initial Exercise Prescription - 10/20/16 1400      Date of Initial Exercise RX and Referring Provider   Date 10/20/16   Referring Provider Bartholome Bill MD     Treadmill   MPH 1   Grade 0.5   Minutes 15   METs 1.83     NuStep   Level 1   SPM 80   Minutes 15   METs 1.7     REL-XR   Level 1   Speed 50   Minutes 15   METs 1.7     Prescription Details   Frequency (times per week) 2   Duration Progress to 45 minutes of aerobic exercise without signs/symptoms of physical distress     Intensity   THRR 40-80% of Max Heartrate 85-126   Ratings of Perceived Exertion 11-13   Perceived Dyspnea 0-4     Progression   Progression Continue to progress workloads to maintain intensity without signs/symptoms of physical distress.     Resistance Training   Training Prescription Yes   Weight 2 lbs   Reps 10-15      Perform Capillary Blood Glucose checks as needed.  Exercise Prescription Changes:     Exercise Prescription Changes    Row Name 10/20/16 1400 11/04/16 1000 11/19/16 1400         Response to Exercise   Blood Pressure (Admit) 126/64 134/70 112/64     Blood Pressure (Exercise) 132/66 152/78 138/74     Blood Pressure (Exit) 126/70 122/60 120/70     Heart Rate (Admit) 44 bpm 77 bpm 53 bpm     Heart Rate (Exercise) 93 bpm 83 bpm 95 bpm     Heart Rate (Exit) 53 bpm 50 bpm 49 bpm     Oxygen Saturation (Admit) 99 %  -  -     Oxygen Saturation (Exercise) 94 %  -  -     Rating of Perceived Exertion (Exercise) 13 11 13      Perceived Dyspnea (Exercise) 1  -  -  Symptoms Slightly SOB, no pain none none     Comments walk test results  -  -     Duration  - Continue  with 45 min of aerobic exercise without signs/symptoms of physical distress. Continue with 45 min of aerobic exercise without signs/symptoms of physical distress.     Intensity  - THRR unchanged THRR unchanged       Progression   Progression  - Continue to progress workloads to maintain intensity without signs/symptoms of physical distress. Continue to progress workloads to maintain intensity without signs/symptoms of physical distress.     Average METs  - 2.13 2.4       Resistance Training   Training Prescription  - Yes Yes     Weight  - 2 lbs 3 lb     Reps  - 10-15 10-15       Interval Training   Interval Training  - No No       Treadmill   MPH  - 1.2 1.2     Grade  - 0.5 0.5     Minutes  - 15 15     METs  - 2 2       NuStep   Level  - 3  -     Minutes  - 15  -     METs  - 2  -       REL-XR   Level  - 1 1     Minutes  - 15 15     METs  - 2.4 2.8       Home Exercise Plan   Plans to continue exercise at  [facility] (comment)  walking, fitness center in complex, [employer]  -     Frequency  - Add 2 additional days to program exercise sessions.  -     Initial Home Exercises Provided  - 11/04/16  -        Exercise Comments:     Exercise Comments    Row Name 10/28/16 1027           Exercise Comments First full day of exercise!  Patient was oriented to gym and equipment including functions, settings, policies, and procedures.  Patient's individual exercise prescription and treatment plan were reviewed.  All starting workloads were established based on the results of the 6 minute walk test done at initial orientation visit.  The plan for exercise progression was also introduced and progression will be customized based on patient's performance and goals.          Exercise Goals and Review:     Exercise Goals    Row Name 10/20/16 1501             Exercise Goals   Increase Physical Activity Yes       Intervention Provide advice, education, support and  counseling about physical activity/exercise needs.;Develop an individualized exercise prescription for aerobic and resistive training based on initial evaluation findings, risk stratification, comorbidities and participant's personal goals.       Expected Outcomes Achievement of increased cardiorespiratory fitness and enhanced flexibility, muscular endurance and strength shown through measurements of functional capacity and personal statement of participant.       Increase Strength and Stamina Yes       Intervention Provide advice, education, support and counseling about physical activity/exercise needs.;Develop an individualized exercise prescription for aerobic and resistive training based on initial evaluation findings, risk stratification, comorbidities and participant's personal goals.  Expected Outcomes Achievement of increased cardiorespiratory fitness and enhanced flexibility, muscular endurance and strength shown through measurements of functional capacity and personal statement of participant.          Exercise Goals Re-Evaluation :     Exercise Goals Re-Evaluation    Row Name 11/04/16 1019 11/04/16 1342 11/06/16 1026 11/19/16 1500       Exercise Goal Re-Evaluation   Exercise Goals Review Increase Physical Activity;Increase Strenth and Stamina Increase Physical Activity;Increase Strenth and Stamina Increase Physical Activity;Increase Strenth and Stamina Increase Physical Activity;Increase Strenth and Stamina    Comments Reviewed home exercise with pt today.  Pt plans to walk and go to [employer] for exercise. She also has access to a fitness center within her complex.   Reviewed THR, pulse, RPE, sign and symptoms, and when to call 911 or MD.  Also discussed weather considerations and indoor options.  Pt voiced understanding. Rocky is off to a good start in rehab.  She is up to 2.4 METs on the XR and level 4 on the NuStep!  We will continue to monitor her progression. Cherly returned to  [employer] yesterday!  She is starting to feel stronger and able to do more around the house now! Emmarie will continue to attend [facility] and add exercise at home.  She has increased her MET level overall.    Expected Outcomes Short: Add in at least one extra day at home.  Long: Get back to going to [employer] regularly. Short: Add in at least one day at home and begin to increase workloads.  Long: Back to regular exercise. Short: Continue to go to [employer] and exercise on off days.  Long: Continue to increase strength and stamina. Short - Fatimah will add home exercise to her program.  Long - Derrika will complete HT and exercise independently.       Discharge Exercise Prescription (Final Exercise Prescription Changes):     Exercise Prescription Changes - 11/19/16 1400      Response to Exercise   Blood Pressure (Admit) 112/64   Blood Pressure (Exercise) 138/74   Blood Pressure (Exit) 120/70   Heart Rate (Admit) 53 bpm   Heart Rate (Exercise) 95 bpm   Heart Rate (Exit) 49 bpm   Rating of Perceived Exertion (Exercise) 13   Symptoms none   Duration Continue with 45 min of aerobic exercise without signs/symptoms of physical distress.   Intensity THRR unchanged     Progression   Progression Continue to progress workloads to maintain intensity without signs/symptoms of physical distress.   Average METs 2.4     Resistance Training   Training Prescription Yes   Weight 3 lb   Reps 10-15     Interval Training   Interval Training No     Treadmill   MPH 1.2   Grade 0.5   Minutes 15   METs 2     REL-XR   Level 1   Minutes 15   METs 2.8      Nutrition:  Target Goals: Understanding of nutrition guidelines, daily intake of sodium <1519m, cholesterol <2063m calories 30% from fat and 7% or less from saturated fats, daily to have 5 or more servings of fruits and vegetables.  Biometrics:     Pre Biometrics - 10/20/16 1502      Pre Biometrics   Height 5' 5.8" (1.671 m)   Weight 211 lb  6.4 oz (95.9 kg)   Waist Circumference 40 inches   Hip Circumference 51 inches  Waist to Hip Ratio 0.78 %   BMI (Calculated) 34.4   Single Leg Stand 0.58 seconds       Nutrition Therapy Plan and Nutrition Goals:     Nutrition Therapy & Goals - 11/11/16 1223      Nutrition Therapy   Diet Instructed on a meal plan based on 1400 calories and heart healthy dietary guidelines   Protein (specify units) 6   Fiber 20 grams   Whole Grain Foods 3 servings   Saturated Fats 10 max. grams   Fruits and Vegetables 5 servings/day   Sodium 1500 grams     Personal Nutrition Goals   Nutrition Goal Read labels for saturated fat, trans fat and sodium.   Personal Goal #2 Look at sodium content of cottage cheese.   Personal Goal #3 Balance meals with 1-3 oz protein, 2-3 servings of carbohydrate and non-starchy vegetables.     Intervention Plan   Intervention Prescribe, educate and counsel regarding individualized specific dietary modifications aiming towards targeted core components such as weight, hypertension, lipid management, diabetes, heart failure and other comorbidities.;Nutrition handout(s) given to patient.   Expected Outcomes Short Term Goal: Understand basic principles of dietary content, such as calories, fat, sodium, cholesterol and nutrients.;Short Term Goal: A plan has been developed with personal nutrition goals set during dietitian appointment.;Long Term Goal: Adherence to prescribed nutrition plan.      Nutrition Discharge: Rate Your Plate Scores:     Nutrition Assessments - 10/20/16 1420      MEDFICTS Scores   Pre Score 38      Nutrition Goals Re-Evaluation:     Nutrition Goals Re-Evaluation    [place] Name 11/06/16 1038             Goals   Current Weight 211 lb (95.7 kg)       Comment Scheduled appointment to meet with dietician on July 3rd, next week.          Nutrition Goals Discharge (Final Nutrition Goals Re-Evaluation):     Nutrition Goals Re-Evaluation  - 11/06/16 1038      Goals   Current Weight 211 lb (95.7 kg)   Comment Scheduled appointment to meet with dietician on July 3rd, next week.      Psychosocial: Target Goals: Acknowledge presence or absence of significant depression and/or stress, maximize coping skills, provide positive support system. Participant is able to verbalize types and ability to use techniques and skills needed for reducing stress and depression.   Initial Review & Psychosocial Screening:     Initial Psych Review & Screening - 10/20/16 1424      Initial Review   Current issues with Current Sleep Concerns;Current Stress Concerns  Since hospital: is improving some.  Continues to require nap daily which is not her norm   Source of Stress Concerns Unable to perform yard/household activities   Comments Still not feeling strong enough to do chores.  Length of stay in hospital was debilitating.  Had home health after discharge and is doing better though not back to normal.     [place]? Yes  Husband and her 68 year old disabled son.     Barriers   Psychosocial barriers to participate in program There are no identifiable barriers or psychosocial needs.;The patient should benefit from training in stress management and relaxation.     Screening Interventions   Interventions Encouraged to exercise;To provide support and resources with identified psychosocial needs;Provide feedback about the scores to  participant      Quality of Life Scores:      Quality of Life - 10/20/16 1429      Quality of Life Scores   Health/Function Pre 20.8 %   Socioeconomic Pre 28.8 %   Psych/Spiritual Pre 22.29 %   Family Pre 28.8 %   GLOBAL Pre 23.63 %      PHQ-9: Recent Review Flowsheet Data    Depression screen [facility] 2/9 11/06/2016 11/03/2016 10/20/2016 10/16/2016 09/29/2016   Decreased Interest 0 0 0 0 0   Down, Depressed, Hopeless 0 0 0 0 0   PHQ - 2 Score 0 0 0 0 0   Altered sleeping - - 1 - -    Tired, decreased energy - - 1  - -   Change in appetite - - 0 - -   Feeling bad or failure about yourself  - - 0 - -   Trouble concentrating - - 0 - -   Moving slowly or fidgety/restless - - 0 - -   Suicidal thoughts - - 0 - -   PHQ-9 Score - - 2 - -   Difficult doing work/chores - - Somewhat difficult - -     Interpretation of Total Score  Total Score Depression Severity:  1-4 = Minimal depression, 5-9 = Mild depression, 10-14 = Moderate depression, 15-19 = Moderately severe depression, 20-27 = Severe depression   Psychosocial Evaluation and Intervention:     Psychosocial Evaluation - 11/04/16 1005      Psychosocial Evaluation & Interventions   Interventions Encouraged to exercise with the program and follow exercise prescription;Stress management education   Comments Counselor met with Alondra today for initial psychosocial evaluation.  She is a 76 year old who came into this program due to pneumonia and a heart attack more recently.  She has a strong support system with a spouse of 63 years who is in good health. She also has a son who lives locally and another who lives in [place].  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  She reports sleeping well and having a good appetite.  She also denies a history or current symptoms of depression or anxiety and states she is typically in a positive mood.  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  She has goals to increase her energy and stamina while in this program.  Staff will follow with Journi throughout the course of this program.     Expected Outcomes Loreen will benefit from consistent exercise to achieve her stated goals.  She will also benefit from the psychoeducational components of this program - particularly stress management.  Staff will be following with Malachy Mood.      Psychosocial  Re-Evaluation:   Psychosocial Discharge (Final Psychosocial Re-Evaluation):   Vocational Rehabilitation: Provide vocational rehab assistance to qualifying candidates.   Vocational Rehab Evaluation & Intervention:     Vocational Rehab - 10/20/16 1434      Initial Vocational Rehab Evaluation & Intervention   Assessment shows need for Vocational Rehabilitation No      Education: Education Goals: Education classes will be provided on a weekly basis, covering required topics. Participant will state understanding/return demonstration of topics presented.  Learning Barriers/Preferences:     Learning Barriers/Preferences - 10/20/16 1433      Learning Barriers/Preferences   Learning Barriers None   Learning Preferences Individual Instruction;Skilled Demonstration;Group Instruction  Education Topics: General Nutrition Guidelines/Fats and Fiber: -Group instruction provided by verbal, written material, models and posters to present the general guidelines for heart healthy nutrition. Gives an explanation and review of dietary fats and fiber.   Cardiac Rehab from 11/18/2016 in [facility] Cardiac and Pulmonary Rehab  Date  11/04/16  Educator  CR  Instruction Review Code  2- meets goals/outcomes      Controlling Sodium/Reading Food Labels: -Group verbal and written material supporting the discussion of sodium use in heart healthy nutrition. Review and explanation with models, verbal and written materials for utilization of the food label.   Cardiac Rehab from 11/18/2016 in [facility] Cardiac and Pulmonary Rehab  Date  11/11/16  Educator  PI  Instruction Review Code  2- meets goals/outcomes      Exercise Physiology & Risk Factors: - Group verbal and written instruction with models to review the exercise physiology of the cardiovascular system and associated critical values. Details cardiovascular disease risk factors and the goals associated with each risk factor.   Cardiac Rehab from  11/18/2016 in [facility] Cardiac and Pulmonary Rehab  Date  11/18/16  Educator  RG/AS  Instruction Review Code  2- meets goals/outcomes      Aerobic Exercise & Resistance Training: - Gives group verbal and written discussion on the health impact of inactivity. On the components of aerobic and resistive training programs and the benefits of this training and how to safely progress through these programs.   Flexibility, Balance, General Exercise Guidelines: - Provides group verbal and written instruction on the benefits of flexibility and balance training programs. Provides general exercise guidelines with specific guidelines to those with heart or lung disease. Demonstration and skill practice provided.   Stress Management: - Provides group verbal and written instruction about the health risks of elevated stress, cause of high stress, and healthy ways to reduce stress.   Depression: - Provides group verbal and written instruction on the correlation between heart/lung disease and depressed mood, treatment options, and the stigmas associated with seeking treatment.   Cardiac Rehab from 11/18/2016 in [facility] Cardiac and Pulmonary Rehab  Date  11/13/16  Educator  [facility]  Instruction Review Code  2- meets goals/outcomes      Anatomy & Physiology of the Heart: - Group verbal and written instruction and models provide basic cardiac anatomy and physiology, with the coronary electrical and arterial systems. Review of: AMI, Angina, Valve disease, Heart Failure, Cardiac Arrhythmia, Pacemakers, and the ICD.   Cardiac Procedures: - Group verbal and written instruction and models to describe the testing methods done to diagnose heart disease. Reviews the outcomes of the test results. Describes the treatment choices: Medical Management, Angioplasty, or Coronary Bypass Surgery.   Cardiac Medications: - Group verbal and written instruction to review commonly prescribed medications for heart disease. Reviews the  medication, class of the drug, and side effects. Includes the steps to properly store meds and maintain the prescription regimen.   Go Sex-Intimacy & Heart Disease, Get SMART - Goal Setting: - Group verbal and written instruction through game format to discuss heart disease and the return to sexual intimacy. Provides group verbal and written material to discuss and apply goal setting through the application of the S.M.A.R.T. Method.   Other Matters of the Heart: - Provides group verbal, written materials and models to describe Heart Failure, Angina, Valve Disease, and Diabetes in the realm of heart disease. Includes description of the disease process and treatment options available to the cardiac patient.   Exercise &  Equipment Safety: - Individual verbal instruction and demonstration of equipment use and safety with use of the equipment.   Cardiac Rehab from 11/18/2016 in [facility] Cardiac and Pulmonary Rehab  Date  10/20/16  Educator  SB  Instruction Review Code  2- meets goals/outcomes      Infection Prevention: - Provides verbal and written material to individual with discussion of infection control including proper hand washing and proper equipment cleaning during exercise session.   Cardiac Rehab from 11/18/2016 in [facility] Cardiac and Pulmonary Rehab  Date  10/20/16  Educator  Sb  Instruction Review Code  2- meets goals/outcomes      Falls Prevention: - Provides verbal and written material to individual with discussion of falls prevention and safety.   Cardiac Rehab from 11/18/2016 in [facility] Cardiac and Pulmonary Rehab  Date  10/20/16  Educator  Sb  Instruction Review Code  2- meets goals/outcomes      Diabetes: - Individual verbal and written instruction to review signs/symptoms of diabetes, desired ranges of glucose level fasting, after meals and with exercise. Advice that pre and post exercise glucose checks will be done for 3 sessions at entry of program.    Knowledge  Questionnaire Score:     Knowledge Questionnaire Score - 10/20/16 1433      Knowledge Questionnaire Score   Pre Score 24/28  Reviewed correct responses with Malachy Mood. She verbalized understanding of the response.       Core Components/Risk Factors/Patient Goals at Admission:     Personal Goals and Risk Factors at Admission - 10/20/16 1423      Core Components/Risk Factors/Patient Goals on Admission    Weight Management Obesity;Yes;Weight Loss   Intervention Weight Management: Develop a combined nutrition and exercise program designed to reach desired caloric intake, while maintaining appropriate intake of nutrient and fiber, sodium and fats, and appropriate energy expenditure required for the weight goal.;Weight Management: Provide education and appropriate resources to help participant work on and attain dietary goals.;Weight Management/Obesity: Establish reasonable short term and long term weight goals.;Obesity: Provide education and appropriate resources to help participant work on and attain dietary goals.   Admit Weight 208 lb (94.3 kg)   Goal Weight: Short Term 205 lb (93 kg)   Goal Weight: Long Term 175 lb (79.4 kg)   Expected Outcomes Short Term: Continue to assess and modify interventions until short term weight is achieved;Long Term: Adherence to nutrition and physical activity/exercise program aimed toward attainment of established weight goal;Weight Loss: Understanding of general recommendations for a balanced deficit meal plan, which promotes 1-2 lb weight loss per week and includes a negative energy balance of [phone] kcal/d;Understanding recommendations for meals to include 15-35% energy as protein, 25-35% energy from fat, 35-60% energy from carbohydrates, less than 281m of dietary cholesterol, 20-35 gm of total fiber daily;Understanding of distribution of calorie intake throughout the day with the consumption of 4-5 meals/snacks   Hypertension Yes   Intervention Provide  education on lifestyle modifcations including regular physical activity/exercise, weight management, moderate sodium restriction and increased consumption of fresh fruit, vegetables, and low fat dairy, alcohol moderation, and smoking cessation.;Monitor prescription use compliance.   Expected Outcomes Short Term: Continued assessment and intervention until BP is < 140/920mHG in hypertensive participants. < 130/8054mG in hypertensive participants with diabetes, heart failure or chronic kidney disease.;Long Term: Maintenance of blood pressure at goal levels.   Lipids Yes   Intervention Provide education and support for participant on nutrition & aerobic/resistive exercise along with prescribed medications  to achieve LDL <38m, HDL >428m   Expected Outcomes Short Term: Participant states understanding of desired cholesterol values and is compliant with medications prescribed. Participant is following exercise prescription and nutrition guidelines.;Long Term: Cholesterol controlled with medications as prescribed, with individualized exercise RX and with personalized nutrition plan. Value goals: LDL < 7052mHDL > 40 mg.      Core Components/Risk Factors/Patient Goals Review:      Goals and Risk Factor Review    Row Name 11/06/16 1034             Core Components/Risk Factors/Patient Goals Review   Personal Goals Review Weight Management/Obesity;Hypertension;Lipids;Heart Failure       Review Abigayl's weight has been holding steady and she weighs herself daily at home.  She continues to have some SOB but it is improving as she is able to do more at home.  [facility] Her blood pressures have been good and she has not had any problems with taking any of her medications.         Expected Outcomes Short: Continue to stay on top of her heart failure symptoms.  Long: Continue to work on risk factor modifications.          Core Components/Risk  Factors/Patient Goals at Discharge (Final Review):      Goals and Risk Factor Review - 11/06/16 1034      Core Components/Risk Factors/Patient Goals Review   Personal Goals Review Weight Management/Obesity;Hypertension;Lipids;Heart Failure   Review Charmon's weight has been holding steady and she weighs herself daily at home.  She continues to have some SOB but it is improving as she is able to do more at home.  [facility] Her blood pressures have been good and she has not had any problems with taking any of her medications.     Expected Outcomes Short: Continue to stay on top of her heart failure symptoms.  Long: Continue to work on risk factor modifications.      ITP Comments:     ITP Comments    Row Name 10/20/16 1405 10/29/16 0552 11/26/16 0642       ITP Comments Medical Review completed . Inital ITP created. [facility] 30 [facility]. Continue with ITP unless directed changes per Medical Director review. New to program 30 day review. Continue with ITP unless directed changes per Medical Director review           Comments:

## 2016-12-02 DIAGNOSIS — I214 Non-ST elevation (NSTEMI) myocardial infarction: Secondary | ICD-10-CM | POA: Diagnosis not present

## 2016-12-02 DIAGNOSIS — H2513 Age-related nuclear cataract, bilateral: Secondary | ICD-10-CM | POA: Diagnosis not present

## 2016-12-02 DIAGNOSIS — Z79899 Other long term (current) drug therapy: Secondary | ICD-10-CM | POA: Diagnosis not present

## 2016-12-02 NOTE — Progress Notes (Signed)
Daily Session Note  Patient Details  Name: Darlene Lawrence MRN: [id number] Date of Birth: 04/28/1941 Referring Provider:     Cardiac Rehab from 10/20/2016 in [facility] Cardiac and Pulmonary Rehab  Referring Provider  Bartholome Bill MD      Encounter Date: 12/02/2016  Check In:     Session Check In - 12/02/16 0856      Check-In   Location [facility]-Cardiac & Pulmonary Rehab   Staff Present Heath Lark, RN, BSN, CCRP;Jessica Luan Pulling, MA, ACSM RCEP, Exercise Physiologist;Amanda Oletta Darter, BA, ACSM CEP, Exercise Physiologist   Supervising physician immediately available to respond to emergencies See telemetry face sheet for immediately available ER MD   Medication changes reported     No   Fall or balance concerns reported    No   Warm-up and Cool-down Performed on first and last piece of equipment   Resistance Training Performed Yes   VAD Patient? No     Pain Assessment   Currently in Pain? No/denies         History  Smoking Status  . Never Smoker  Smokeless Tobacco  . Never Used    Goals Met:  Independence with exercise equipment Exercise tolerated well No report of cardiac concerns or symptoms Strength training completed today  Goals Unmet:  Not Applicable  Comments: Pt able to follow exercise prescription today without complaint.  Will continue to monitor for progression.    Dr. Emily Filbert is Medical Director for [place] and [facility].

## 2016-12-04 DIAGNOSIS — Z79899 Other long term (current) drug therapy: Secondary | ICD-10-CM | POA: Diagnosis not present

## 2016-12-04 DIAGNOSIS — I214 Non-ST elevation (NSTEMI) myocardial infarction: Secondary | ICD-10-CM | POA: Diagnosis not present

## 2016-12-04 NOTE — Progress Notes (Signed)
Daily Session Note  Patient Details  Name: Darlene Lawrence MRN: [id number] Date of Birth: [birth year] Referring Provider:     Cardiac Rehab from 10/20/2016 in [facility] Cardiac and Pulmonary Rehab  Referring Provider  Bartholome Bill MD      Encounter Date: 12/04/2016  Check In:     Session Check In - 12/04/16 0828      Check-In   Location [facility]-Cardiac & Pulmonary Rehab   Staff Present Alberteen Sam, MA, ACSM RCEP, Exercise Physiologist;Amanda Oletta Darter, BA, ACSM CEP, Exercise Physiologist;Meredith Sherryll Burger, RN BSN   Supervising physician immediately available to respond to emergencies See telemetry face sheet for immediately available ER MD   Medication changes reported     No   Fall or balance concerns reported    No   Warm-up and Cool-down Performed on first and last piece of equipment   Resistance Training Performed Yes   VAD Patient? No     Pain Assessment   Currently in Pain? No/denies           Exercise Prescription Changes - 12/03/16 1400      Response to Exercise   Blood Pressure (Admit) 126/72   Blood Pressure (Exercise) 136/66   Blood Pressure (Exit) 124/64   Heart Rate (Admit) 59 bpm   Heart Rate (Exercise) 94 bpm   Heart Rate (Exit) 48 bpm   Rating of Perceived Exertion (Exercise) 13   Symptoms none   Duration Continue with 45 min of aerobic exercise without signs/symptoms of physical distress.   Intensity THRR unchanged     Progression   Progression Continue to progress workloads to maintain intensity without signs/symptoms of physical distress.   Average METs 2.7     Resistance Training   Training Prescription Yes   Weight 3 lb   Reps 10-15     Interval Training   Interval Training No     Treadmill   MPH 1.2   Grade 0.5   Minutes 15   METs 2     NuStep   Level 4   Minutes 15   METs 2.7     REL-XR   Level 1   Minutes 15   METs 3.5     Home Exercise Plan   Plans to continue exercise at [facility] (comment)  walking, fitness  center in complex, [employer]   Frequency Add 2 additional days to program exercise sessions.   Initial Home Exercises Provided 11/04/16      History  Smoking Status  . Never Smoker  Smokeless Tobacco  . Never Used    Goals Met:  Independence with exercise equipment Exercise tolerated well No report of cardiac concerns or symptoms Strength training completed today  Goals Unmet:  Not Applicable  Comments: Pt able to follow exercise prescription today without complaint.  Will continue to monitor for progression.    Dr. Emily Filbert is Medical Director for [place] and [facility].

## 2016-12-08 ENCOUNTER — Encounter: Payer: Self-pay | Admitting: *Deleted

## 2016-12-08 ENCOUNTER — Other Ambulatory Visit: Payer: Self-pay | Admitting: *Deleted

## 2016-12-08 NOTE — Patient Outreach (Signed)
[facility]) Care Management [place] Telephone Outreach, Case Closure  12/08/2016  JOHNNETTA HOLSTINE 1940-10-14 [id number]  Successful telephone outreach to Melene [age] y.o.femalereferred to [facility] for transition of care after hospitalization May 4-14, 2018 for CAP/ CHF exacerbation andNSTEMI. Patient was discharged home with home health [facility]) services which are now complete. Patient has history including, but not limited to, breast cancer, HTN, HLD, (R) BBB, and CKD stage III. HIPAA/ identity verified with patient during phone calltoday.  Today, patient continues to report that she "is still doing very good."Patient is in no distress throughout entirety of phone calltoday.   Reports:  -- Attendeding cardiac rehab twice a week, "enjoying" driving herself to cardiac rehab and provider appointments.  Reports cardiac rehab to continue "through the end of August."  Reports cardiac rehab sessions have helped, and shares that she was able to complete a 2-mile hike while on vacation recently; states that she "stopped and rested" during hike as needed.  -- Has all medicationsand takes as prescribed;reports that she contacted her cardiologist regarding recent hair loss she has experienced after being started on metoprolol; was advised to stop metoprolol, which she reports doing "for about a week now."  Denies questions/ concernsaroundcurrent medications.   -- Provider appointments: Has attended all scheduled provider appointments post-hospital discharge.Verbalizes plans to attend all upcoming scheduled appointments.   -- Safety/ Mobility/ Falls: Denies new falls.   -- Self-health management of chronic disease state of CHF: Reports that she has continued monitoring/ recording daily weights, reports weight today of 207lbs, states, "I have stayed right in my 207-208 lb range like I always do." Verbalizes accurate understanding to  contact provider for weight gain of 3pounds overnight, 5 pounds in one week. Denies issues/ concernswith shortness of breath, LE swelling. Patient reports in "green zone" today, "have been in green zone all month."  Patient denies further issues, concerns, or problems today. We again discussed today that patient has thus far met her previously established [facility] CCM goals, and patient confirms today that she believes she is ready for discharge from [place] program.  I confirmed that patient hasmy direct phone number, the main [place] office phone number, and the [facility] CM 24-hour nurse advice phone number should issues arise in the future.  Plan:  Will close [place] program, as patient has successfully met her previously established goals, and will make her PCP aware of same.  Oneta Rack, RN, BSN, [employer] [facility] Care Management  [phone]

## 2016-12-09 DIAGNOSIS — Z79899 Other long term (current) drug therapy: Secondary | ICD-10-CM | POA: Diagnosis not present

## 2016-12-09 DIAGNOSIS — I214 Non-ST elevation (NSTEMI) myocardial infarction: Secondary | ICD-10-CM

## 2016-12-09 NOTE — Progress Notes (Signed)
Daily Session Note  Patient Details  Name: BAYLIE DRAKES MRN: [id number] Date of Birth: 1940-07-26 Referring Provider:     Cardiac Rehab from 10/20/2016 in [facility] Cardiac and Pulmonary Rehab  Referring Provider  Bartholome Bill MD      Encounter Date: 12/09/2016  Check In:     Session Check In - 12/09/16 0834      Check-In   Location [facility]-Cardiac & Pulmonary Rehab   Staff Present Gerlene Burdock, RN, Vickki Hearing, BA, ACSM CEP, Exercise Physiologist;Krista Frederico Hamman, RN BSN   Supervising physician immediately available to respond to emergencies See telemetry face sheet for immediately available ER MD   Medication changes reported     No   Fall or balance concerns reported    No   Warm-up and Cool-down Performed on first and last piece of equipment   Resistance Training Performed Yes   VAD Patient? No     Pain Assessment   Currently in Pain? No/denies         History  Smoking Status  . Never Smoker  Smokeless Tobacco  . Never Used    Goals Met:  Independence with exercise equipment Exercise tolerated well No report of cardiac concerns or symptoms Strength training completed today  Goals Unmet:  Not Applicable  Comments: Pt able to follow exercise prescription today without complaint.  Will continue to monitor for progression.    Dr. Emily Filbert is Medical Director for [place] and [facility].

## 2016-12-11 ENCOUNTER — Encounter: Payer: Medicare Other | Attending: Cardiology

## 2016-12-11 DIAGNOSIS — Z79899 Other long term (current) drug therapy: Secondary | ICD-10-CM | POA: Diagnosis not present

## 2016-12-11 DIAGNOSIS — I214 Non-ST elevation (NSTEMI) myocardial infarction: Secondary | ICD-10-CM | POA: Insufficient documentation

## 2016-12-11 NOTE — Progress Notes (Signed)
Daily Session Note  Patient Details  Name: Darlene Lawrence MRN: [id number] Date of Birth: [birth year] Referring Provider:     Cardiac Rehab from 10/20/2016 in [facility] Cardiac and Pulmonary Rehab  Referring Provider  Bartholome Bill MD      Encounter Date: 12/11/2016  Check In:     Session Check In - 12/11/16 0841      Check-In   Location [facility]-Cardiac & Pulmonary Rehab   Staff Present Alberteen Sam, MA, ACSM RCEP, Exercise Physiologist;Jewell Ryans Oletta Darter, BA, ACSM CEP, Exercise Physiologist;Meredith Sherryll Burger, RN BSN   Supervising physician immediately available to respond to emergencies See telemetry face sheet for immediately available ER MD   Medication changes reported     No   Fall or balance concerns reported    No   Warm-up and Cool-down Performed on first and last piece of equipment   Resistance Training Performed Yes   VAD Patient? No     Pain Assessment   Currently in Pain? No/denies         History  Smoking Status  . Never Smoker  Smokeless Tobacco  . Never Used    Goals Met:  Proper associated with RPD/PD & O2 Sat Independence with exercise equipment Personal goals reviewed No report of cardiac concerns or symptoms Strength training completed today  Goals Unmet:  Not Applicable  Comments: Pt able to follow exercise prescription today without complaint.  Will continue to monitor for progression.    Dr. Emily Filbert is Medical Director for [place] and [facility].

## 2016-12-15 DIAGNOSIS — M62830 Muscle spasm of back: Secondary | ICD-10-CM | POA: Diagnosis not present

## 2016-12-15 DIAGNOSIS — M5136 Other intervertebral disc degeneration, lumbar region: Secondary | ICD-10-CM | POA: Diagnosis not present

## 2016-12-15 DIAGNOSIS — M9903 Segmental and somatic dysfunction of lumbar region: Secondary | ICD-10-CM | POA: Diagnosis not present

## 2016-12-15 DIAGNOSIS — M9902 Segmental and somatic dysfunction of thoracic region: Secondary | ICD-10-CM | POA: Diagnosis not present

## 2016-12-16 ENCOUNTER — Encounter: Payer: Medicare Other | Admitting: *Deleted

## 2016-12-16 DIAGNOSIS — I214 Non-ST elevation (NSTEMI) myocardial infarction: Secondary | ICD-10-CM | POA: Diagnosis not present

## 2016-12-16 DIAGNOSIS — Z79899 Other long term (current) drug therapy: Secondary | ICD-10-CM | POA: Diagnosis not present

## 2016-12-16 NOTE — Progress Notes (Signed)
Daily Session Note  Patient Details  Name: SAMAH LAPIANA MRN: [id number] Date of Birth: 12-Apr-1941 Referring Provider:     Cardiac Rehab from 10/20/2016 in [facility] Cardiac and Pulmonary Rehab  Referring Provider  Bartholome Bill MD      Encounter Date: 12/16/2016  Check In:     Session Check In - 12/16/16 0817      Check-In   Location [facility]-Cardiac & Pulmonary Rehab   Staff Present Alberteen Sam, MA, ACSM RCEP, Exercise Physiologist;Susanne Bice, RN, BSN, Florestine Avers, RN BSN   Supervising physician immediately available to respond to emergencies See telemetry face sheet for immediately available ER MD   Medication changes reported     No   Fall or balance concerns reported    No   Warm-up and Cool-down Performed on first and last piece of equipment   Resistance Training Performed Yes   VAD Patient? No     Pain Assessment   Currently in Pain? No/denies   Multiple Pain Sites No         History  Smoking Status  . Never Smoker  Smokeless Tobacco  . Never Used    Goals Met:  Independence with exercise equipment Exercise tolerated well No report of cardiac concerns or symptoms Strength training completed today  Goals Unmet:  Not Applicable  Comments: Pt able to follow exercise prescription today without complaint.  Will continue to monitor for progression.    Dr. Emily Filbert is Medical Director for [place] and [facility].

## 2016-12-18 ENCOUNTER — Encounter: Payer: Medicare Other | Admitting: *Deleted

## 2016-12-18 DIAGNOSIS — I214 Non-ST elevation (NSTEMI) myocardial infarction: Secondary | ICD-10-CM

## 2016-12-18 NOTE — Progress Notes (Signed)
Daily Session Note  Patient Details  Name: VESNA KABLE MRN: [id number] Date of Birth: January 23, 1941 Referring Provider:     Cardiac Rehab from 10/20/2016 in [facility] Cardiac and Pulmonary Rehab  Referring Provider  Bartholome Bill MD      Encounter Date: 12/18/2016  Check In:     Session Check In - 12/18/16 0815      Check-In   Location [facility]-Cardiac & Pulmonary Rehab   Staff Present Alberteen Sam, MA, ACSM RCEP, Exercise Physiologist;Krista Frederico Hamman, RN BSN;Meredith Sherryll Burger, RN BSN   Supervising physician immediately available to respond to emergencies See telemetry face sheet for immediately available ER MD   Medication changes reported     No   Fall or balance concerns reported    No   Warm-up and Cool-down Performed on first and last piece of equipment   Resistance Training Performed Yes   VAD Patient? No     Pain Assessment   Currently in Pain? No/denies   Multiple Pain Sites No         History  Smoking Status  . Never Smoker  Smokeless Tobacco  . Never Used    Goals Met:  Independence with exercise equipment Exercise tolerated well No report of cardiac concerns or symptoms Strength training completed today  Goals Unmet:  Not Applicable  Comments: Pt able to follow exercise prescription today without complaint.  Will continue to monitor for progression.    Dr. Emily Filbert is Medical Director for [place] and [facility].

## 2016-12-23 ENCOUNTER — Encounter: Payer: Medicare Other | Admitting: *Deleted

## 2016-12-23 DIAGNOSIS — I214 Non-ST elevation (NSTEMI) myocardial infarction: Secondary | ICD-10-CM | POA: Diagnosis not present

## 2016-12-23 DIAGNOSIS — Z79899 Other long term (current) drug therapy: Secondary | ICD-10-CM | POA: Diagnosis not present

## 2016-12-23 NOTE — Progress Notes (Signed)
Daily Session Note  Patient Details  Name: Darlene Lawrence MRN: [id number] Date of Birth: [birth year] Referring Provider:     Cardiac Rehab from 10/20/2016 in [facility] Cardiac and Pulmonary Rehab  Referring Provider  Bartholome Bill MD      Encounter Date: 12/23/2016  Check In:     Session Check In - 12/23/16 1030      Check-In   Location [facility]-Cardiac & Pulmonary Rehab   Staff Present Heath Lark, RN, BSN, CCRP;Jessica Luan Pulling, MA, ACSM RCEP, Exercise Physiologist;Amanda Oletta Darter, BA, ACSM CEP, Exercise Physiologist   Supervising physician immediately available to respond to emergencies See telemetry face sheet for immediately available ER MD   Medication changes reported     No   Fall or balance concerns reported    No   Warm-up and Cool-down Performed on first and last piece of equipment   Resistance Training Performed Yes   VAD Patient? No     Pain Assessment   Currently in Pain? No/denies   Multiple Pain Sites No         History  Smoking Status  . Never Smoker  Smokeless Tobacco  . Never Used    Goals Met:  Independence with exercise equipment Exercise tolerated well No report of cardiac concerns or symptoms Strength training completed today  Goals Unmet:  Not Applicable  Comments:Pt able to follow exercise prescription today without complaint.  Will continue to monitor for progression.    Dr. Emily Filbert is Medical Director for [place] and [facility].

## 2016-12-24 ENCOUNTER — Encounter: Payer: Self-pay | Admitting: *Deleted

## 2016-12-24 DIAGNOSIS — I214 Non-ST elevation (NSTEMI) myocardial infarction: Secondary | ICD-10-CM

## 2016-12-24 NOTE — Progress Notes (Signed)
Cardiac Individual Treatment Plan  Patient Details  Name: Darlene Lawrence MRN: [id number] Date of Birth: 1941-02-27 Referring Provider:     Cardiac Rehab from 10/20/2016 in [facility] Cardiac and Pulmonary Rehab  Referring Provider  Bartholome Bill MD      Initial Encounter Date:    Cardiac Rehab from 10/20/2016 in [facility] Cardiac and Pulmonary Rehab  Date  10/20/16  Referring Provider  Bartholome Bill MD      Visit Diagnosis: NSTEMI (non-ST elevated myocardial infarction) [facility])  Patient's Home Medications on Admission:  Current Outpatient Prescriptions:  .  Aspirin (ASPIR-81 PO), Take 1 tablet by mouth., Disp: , Rfl:  .  b complex vitamins tablet, Take 1 tablet by mouth daily., Disp: , Rfl:  .  Calcium Citrate-Vitamin D (CALCIUM CITRATE + PO), Take 1 tablet by mouth daily., Disp: , Rfl:  .  Cholecalciferol (VITAMIN D3) 2000 UNITS TABS, Take 1 capsule by mouth 2 (two) times daily., Disp: , Rfl:  .  Coenzyme Q10 (COQ10) 100 MG CAPS, Take 1 capsule by mouth daily., Disp: , Rfl:  .  furosemide (LASIX) 20 MG tablet, Take 1 tablet (20 mg total) by mouth daily., Disp: 30 tablet, Rfl: 0 .  metoprolol tartrate (LOPRESSOR) 25 MG tablet, Take 1 tablet (25 mg total) by mouth 2 (two) times daily. (Patient not taking: Reported on 12/08/2016), Disp: 60 tablet, Rfl: 0 .  Misc Natural Products (GLUCOSAMINE CHOND MSM FORMULA PO), Take 1,500 mg by mouth., Disp: , Rfl:  .  Multiple Vitamin (MULTI VITAMIN DAILY PO), Take 1 tablet by mouth., Disp: , Rfl:  .  Omega-3 Fatty Acids (FISH OIL) 1200 MG CAPS, Take 1 capsule by mouth., Disp: , Rfl:  .  rosuvastatin (CRESTOR) 10 MG tablet, Take 10 mg by mouth 2 (two) times a week. Monday, Thursday., Disp: , Rfl:   Past Medical History: Past Medical History:  Diagnosis Date  . Abnormal Pap smear   . Arthritis   . breast cancer 2009   right  . Cancer ([place])   . CHF (congestive heart failure) ([place])   . DDD (degenerative disc disease), lumbar   . High cholesterol   .  Hyperlipidemia   . Hypertension   . Meniscus tear    right  . Personal history of radiation therapy   . RBBB     Tobacco Use: History  Smoking Status  . Never Smoker  Smokeless Tobacco  . Never Used    Labs: Recent Review Flowsheet Data    Labs for ITP Cardiac and Pulmonary Rehab Latest Ref Rng & Units 06/27/2014 10/11/2015 09/13/2016 09/13/2016 09/13/2016   Cholestrol 0 - 200 mg/dL 169 150 114 - -   LDLCALC 0 - 99 mg/dL 94 80 62 - -   HDL >40 mg/dL 47.60 43.50 26(L) - -   Trlycerides <150 mg/dL 136.0 133.0 129 - -   PHART 7.350 - 7.450 - - - 7.48(H) 7.53(H)   PCO2ART 32.0 - 48.0 mmHg - - - 34 31(L)   HCO3 20.0 - 28.0 mmol/L - - - 25.3 25.9   O2SAT % - - - 87.9 98.2       Exercise Target Goals:    Exercise Program Goal: Individual exercise prescription set with THRR, safety & activity barriers. Participant demonstrates ability to understand and report RPE using BORG scale, to self-measure pulse accurately, and to acknowledge the importance of the exercise prescription.  Exercise Prescription Goal: Starting with aerobic activity 30 plus minutes a day, 3 days per week  for initial exercise prescription. Provide home exercise prescription and guidelines that participant acknowledges understanding prior to discharge.  Activity Barriers & Risk Stratification:     Activity Barriers & Cardiac Risk Stratification - 10/20/16 1415      Activity Barriers & Cardiac Risk Stratification   Activity Barriers Left Hip Replacement;Right Knee Replacement;Back Problems;Deconditioning;Muscular Weakness;Shortness of Breath;Other (comment);Balance Concerns   Comments menicus tear, back pain after doing some housework   Cardiac Risk Stratification High      6 Minute Walk:     6 Minute Walk    Row Name 10/20/16 1454         6 Minute Walk   Phase Initial     Distance 1060 feet     Walk Time 6 minutes     # of Rest Breaks 0     MPH 2     METS 1.79     RPE 13     Perceived Dyspnea  1      VO2 Peak 6.28     Symptoms Yes (comment)     Comments slightly SOB, no pain     Resting HR 44 bpm     Resting BP 126/64     Max Ex. HR 93 bpm     Max Ex. BP 132/66     2 Minute Post BP 126/70        Oxygen Initial Assessment:   Oxygen Re-Evaluation:   Oxygen Discharge (Final Oxygen Re-Evaluation):   Initial Exercise Prescription:     Initial Exercise Prescription - 10/20/16 1400      Date of Initial Exercise RX and Referring Provider   Date 10/20/16   Referring Provider Bartholome Bill MD     Treadmill   MPH 1   Grade 0.5   Minutes 15   METs 1.83     NuStep   Level 1   SPM 80   Minutes 15   METs 1.7     REL-XR   Level 1   Speed 50   Minutes 15   METs 1.7     Prescription Details   Frequency (times per week) 2   Duration Progress to 45 minutes of aerobic exercise without signs/symptoms of physical distress     Intensity   THRR 40-80% of Max Heartrate 85-126   Ratings of Perceived Exertion 11-13   Perceived Dyspnea 0-4     Progression   Progression Continue to progress workloads to maintain intensity without signs/symptoms of physical distress.     Resistance Training   Training Prescription Yes   Weight 2 lbs   Reps 10-15      Perform Capillary Blood Glucose checks as needed.  Exercise Prescription Changes:     Exercise Prescription Changes    Row Name 10/20/16 1400 11/04/16 1000 11/19/16 1400 12/03/16 1400 12/18/16 1500     Response to Exercise   Blood Pressure (Admit) 126/64 134/70 112/64 126/72 120/62   Blood Pressure (Exercise) 132/66 152/78 138/74 136/66 140/74   Blood Pressure (Exit) 126/70 122/60 120/70 124/64 132/70   Heart Rate (Admit) 44 bpm 77 bpm 53 bpm 59 bpm 62 bpm   Heart Rate (Exercise) 93 bpm 83 bpm 95 bpm 94 bpm 95 bpm   Heart Rate (Exit) 53 bpm 50 bpm 49 bpm 48 bpm 56 bpm   Oxygen Saturation (Admit) 99 %  -  -  -  -   Oxygen Saturation (Exercise) 94 %  -  -  -  -   Rating  of Perceived Exertion (Exercise) '13 11 13 13  11   '$ Perceived Dyspnea (Exercise) 1  -  -  -  -   Symptoms Slightly SOB, no pain none none none none   Comments walk test results  -  -  -  -   Duration  - Continue with 45 min of aerobic exercise without signs/symptoms of physical distress. Continue with 45 min of aerobic exercise without signs/symptoms of physical distress. Continue with 45 min of aerobic exercise without signs/symptoms of physical distress. Continue with 45 min of aerobic exercise without signs/symptoms of physical distress.   Intensity  - THRR unchanged THRR unchanged THRR unchanged THRR unchanged     Progression   Progression  - Continue to progress workloads to maintain intensity without signs/symptoms of physical distress. Continue to progress workloads to maintain intensity without signs/symptoms of physical distress. Continue to progress workloads to maintain intensity without signs/symptoms of physical distress. Continue to progress workloads to maintain intensity without signs/symptoms of physical distress.   Average METs  - 2.13 2.4 2.7 2.7     Resistance Training   Training Prescription  - Yes Yes Yes Yes   Weight  - 2 lbs 3 lb 3 lb 3 lb   Reps  - 10-15 10-15 10-15 10-15     Interval Training   Interval Training  - No No No No     Treadmill   MPH  - 1.2 1.2 1.2 1.2   Grade  - 0.5 0.5 0.5 0.5   Minutes  - '15 15 15 15   '$ METs  - '2 2 2 2     '$ NuStep   Level  - 3  - 4 5   Minutes  - 15  - 15 15   METs  - 2  - 2.7 2.8     REL-XR   Level  - '1 1 1 4   '$ Minutes  - '15 15 15 15   '$ METs  - 2.4 2.8 3.5 3.3     Home Exercise Plan   Plans to continue exercise at  [facility] (comment)  walking, fitness center in complex, [place] (comment)  walking, fitness center in complex, [place] (comment)  walking, fitness center in complex, [employer]   Frequency  - Add 2 additional days to program exercise sessions.  - Add 2 additional days to program exercise sessions. Add 2  additional days to program exercise sessions.   Initial Home Exercises Provided  - 11/04/16  - 11/04/16 11/04/16      Exercise Comments:     Exercise Comments    Row Name 10/28/16 1027           Exercise Comments First full day of exercise!  Patient was oriented to gym and equipment including functions, settings, policies, and procedures.  Patient's individual exercise prescription and treatment plan were reviewed.  All starting workloads were established based on the results of the 6 minute walk test done at initial orientation visit.  The plan for exercise progression was also introduced and progression will be customized based on patient's performance and goals.          Exercise Goals and Review:     Exercise Goals    Row Name 10/20/16 1501             Exercise Goals   Increase Physical Activity Yes       Intervention Provide advice, education, support and counseling about  physical activity/exercise needs.;Develop an individualized exercise prescription for aerobic and resistive training based on initial evaluation findings, risk stratification, comorbidities and participant's personal goals.       Expected Outcomes Achievement of increased cardiorespiratory fitness and enhanced flexibility, muscular endurance and strength shown through measurements of functional capacity and personal statement of participant.       Increase Strength and Stamina Yes       Intervention Provide advice, education, support and counseling about physical activity/exercise needs.;Develop an individualized exercise prescription for aerobic and resistive training based on initial evaluation findings, risk stratification, comorbidities and participant's personal goals.       Expected Outcomes Achievement of increased cardiorespiratory fitness and enhanced flexibility, muscular endurance and strength shown through measurements of functional capacity and personal statement of participant.           Exercise Goals Re-Evaluation :     Exercise Goals Re-Evaluation    Row Name 11/04/16 1019 11/04/16 1342 11/06/16 1026 11/19/16 1500 12/03/16 1426     Exercise Goal Re-Evaluation   Exercise Goals Review Increase Physical Activity;Increase Strenth and Stamina Increase Physical Activity;Increase Strenth and Stamina Increase Physical Activity;Increase Strenth and Stamina Increase Physical Activity;Increase Strenth and Stamina Increase Physical Activity;Increase Strenth and Stamina   Comments Reviewed home exercise with pt today.  Pt plans to walk and go to [employer] for exercise. She also has access to a fitness center within her complex.   Reviewed THR, pulse, RPE, sign and symptoms, and when to call 911 or MD.  Also discussed weather considerations and indoor options.  Pt voiced understanding. Tailer is off to a good start in rehab.  She is up to 2.4 METs on the XR and level 4 on the NuStep!  We will continue to monitor her progression. Cherly returned to [employer] yesterday!  She is starting to feel stronger and able to do more around the house now! Naysha will continue to attend [facility] and add exercise at home.  She has increased her MET level overall. Shereka has been doing well in rehab.  She has been doing well and walked while out on vacation.  She is now up to 3.5 METs on the XR.  We will continue to monitor her progression.   Expected Outcomes Short: Add in at least one extra day at home.  Long: Get back to going to [employer] regularly. Short: Add in at least one day at home and begin to increase workloads.  Long: Back to regular exercise. Short: Continue to go to [employer] and exercise on off days.  Long: Continue to increase strength and stamina. Short - Kavya will add home exercise to her program.  Long - Sanya will complete HT and exercise independently. Short: Yides will start to increase her workload on the XR.  Long: Continue to exercise independently   [place] Name 12/11/16 0858 12/18/16 1527            Exercise Goal Re-Evaluation   Exercise Goals Review Increase Physical Activity;Increase Strenth and Stamina Increase Physical Activity;Increase Strenth and Stamina      Comments Sherrye attends [employer] twice per week outside HT.   Deborrah has been doing well in rehab.  She is up to level 5 on the NuStep.  She is going to [employer] regularly.  We will continue to monitor for progression.       Expected Outcomes Short - Aaniya will continue regular exercise and complete HT.  Long - Kallyn will maintain long term regular exercise. Short: Continue to  attend classes and [employer] regularly.  Long: Continue to work on IT sales professional.          Discharge Exercise Prescription (Final Exercise Prescription Changes):     Exercise Prescription Changes - 12/18/16 1500      Response to Exercise   Blood Pressure (Admit) 120/62   Blood Pressure (Exercise) 140/74   Blood Pressure (Exit) 132/70   Heart Rate (Admit) 62 bpm   Heart Rate (Exercise) 95 bpm   Heart Rate (Exit) 56 bpm   Rating of Perceived Exertion (Exercise) 11   Symptoms none   Duration Continue with 45 min of aerobic exercise without signs/symptoms of physical distress.   Intensity THRR unchanged     Progression   Progression Continue to progress workloads to maintain intensity without signs/symptoms of physical distress.   Average METs 2.7     Resistance Training   Training Prescription Yes   Weight 3 lb   Reps 10-15     Interval Training   Interval Training No     Treadmill   MPH 1.2   Grade 0.5   Minutes 15   METs 2     NuStep   Level 5   Minutes 15   METs 2.8     REL-XR   Level 4   Minutes 15   METs 3.3     Home Exercise Plan   Plans to continue exercise at [facility] (comment)  walking, fitness center in complex, [employer]   Frequency Add 2 additional days to program exercise sessions.   Initial Home Exercises Provided 11/04/16      Nutrition:  Target Goals: Understanding of nutrition  guidelines, daily intake of sodium '1500mg'$ , cholesterol '200mg'$ , calories 30% from fat and 7% or less from saturated fats, daily to have 5 or more servings of fruits and vegetables.  Biometrics:     Pre Biometrics - 10/20/16 1502      Pre Biometrics   Height 5' 5.8" (1.671 m)   Weight 211 lb 6.4 oz (95.9 kg)   Waist Circumference 40 inches   Hip Circumference 51 inches   Waist to Hip Ratio 0.78 %   BMI (Calculated) 34.4   Single Leg Stand 0.58 seconds       Nutrition Therapy Plan and Nutrition Goals:     Nutrition Therapy & Goals - 11/11/16 1223      Nutrition Therapy   Diet Instructed on a meal plan based on 1400 calories and heart healthy dietary guidelines   Protein (specify units) 6   Fiber 20 grams   Whole Grain Foods 3 servings   Saturated Fats 10 max. grams   Fruits and Vegetables 5 servings/day   Sodium 1500 grams     Personal Nutrition Goals   Nutrition Goal Read labels for saturated fat, trans fat and sodium.   Personal Goal #2 Look at sodium content of cottage cheese.   Personal Goal #3 Balance meals with 1-3 oz protein, 2-3 servings of carbohydrate and non-starchy vegetables.     Intervention Plan   Intervention Prescribe, educate and counsel regarding individualized specific dietary modifications aiming towards targeted core components such as weight, hypertension, lipid management, diabetes, heart failure and other comorbidities.;Nutrition handout(s) given to patient.   Expected Outcomes Short Term Goal: Understand basic principles of dietary content, such as calories, fat, sodium, cholesterol and nutrients.;Short Term Goal: A plan has been developed with personal nutrition goals set during dietitian appointment.;Long Term Goal: Adherence to prescribed nutrition plan.  Nutrition Discharge: Rate Your Plate Scores:     Nutrition Assessments - 10/20/16 1420      MEDFICTS Scores   Pre Score 38      Nutrition Goals Re-Evaluation:     Nutrition  Goals Re-Evaluation    [place] Name 11/06/16 1038             Goals   Current Weight 211 lb (95.7 kg)       Comment Scheduled appointment to meet with dietician on July 3rd, next week.          Nutrition Goals Discharge (Final Nutrition Goals Re-Evaluation):     Nutrition Goals Re-Evaluation - 11/06/16 1038      Goals   Current Weight 211 lb (95.7 kg)   Comment Scheduled appointment to meet with dietician on July 3rd, next week.      Psychosocial: Target Goals: Acknowledge presence or absence of significant depression and/or stress, maximize coping skills, provide positive support system. Participant is able to verbalize types and ability to use techniques and skills needed for reducing stress and depression.   Initial Review & Psychosocial Screening:     Initial Psych Review & Screening - 10/20/16 1424      Initial Review   Current issues with Current Sleep Concerns;Current Stress Concerns  Since hospital: is improving some.  Continues to require nap daily which is not her norm   Source of Stress Concerns Unable to perform yard/household activities   Comments Still not feeling strong enough to do chores.  Length of stay in hospital was debilitating.  Had home health after discharge and is doing better though not back to normal.     [place]? Yes  Husband and her 22 year old disabled son.     Barriers   Psychosocial barriers to participate in program There are no identifiable barriers or psychosocial needs.;The patient should benefit from training in stress management and relaxation.     Screening Interventions   Interventions Encouraged to exercise;To provide support and resources with identified psychosocial needs;Provide feedback about the scores to participant      Quality of Life Scores:      Quality of Life - 10/20/16 1429      Quality of Life Scores   Health/Function Pre 20.8 %   Socioeconomic Pre 28.8 %   Psych/Spiritual Pre  22.29 %   Family Pre 28.8 %   GLOBAL Pre 23.63 %      PHQ-9: Recent Review Flowsheet Data    Depression screen [facility] 2/9 11/06/2016 11/03/2016 10/20/2016 10/16/2016 09/29/2016   Decreased Interest 0 0 0 0 0   Down, Depressed, Hopeless 0 0 0 0 0   PHQ - 2 Score 0 0 0 0 0   Altered sleeping - - 1 - -   Tired, decreased energy - - 1  - -   Change in appetite - - 0 - -   Feeling bad or failure about yourself  - - 0 - -   Trouble concentrating - - 0 - -   Moving slowly or fidgety/restless - - 0 - -   Suicidal thoughts - - 0 - -   PHQ-9 Score - - 2 - -   Difficult doing work/chores - - Somewhat difficult - -     Interpretation of Total Score  Total Score Depression Severity:  1-4 = Minimal depression, 5-9 = Mild depression, 10-14 = Moderate depression, 15-19 = Moderately severe depression, 20-27 =  Severe depression   Psychosocial Evaluation and Intervention:     Psychosocial Evaluation - 11/04/16 1005      Psychosocial Evaluation & Interventions   Interventions Encouraged to exercise with the program and follow exercise prescription;Stress management education   Comments Counselor met with Keeli today for initial psychosocial evaluation.  She is a 76 year old who came into this program due to pneumonia and a heart attack more recently.  She has a strong support system with a spouse of 53 years who is in good health. She also has a son who lives locally and another who lives in [place].  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  She reports sleeping well and having a good appetite.  She also denies a history or current symptoms of depression or anxiety and states she is typically in a positive mood.  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  She has goals to increase her energy and stamina while in this program.  Staff will follow with Aparna throughout the  course of this program.     Expected Outcomes Shima will benefit from consistent exercise to achieve her stated goals.  She will also benefit from the psychoeducational components of this program - particularly stress management.  Staff will be following with Elnita Maxwell.      Psychosocial Re-Evaluation:   Psychosocial Discharge (Final Psychosocial Re-Evaluation):   Vocational Rehabilitation: Provide vocational rehab assistance to qualifying candidates.   Vocational Rehab Evaluation & Intervention:     Vocational Rehab - 10/20/16 1434      Initial Vocational Rehab Evaluation & Intervention   Assessment shows need for Vocational Rehabilitation No      Education: Education Goals: Education classes will be provided on a weekly basis, covering required topics. Participant will state understanding/return demonstration of topics presented.  Learning Barriers/Preferences:     Learning Barriers/Preferences - 10/20/16 1433      Learning Barriers/Preferences   Learning Barriers None   Learning Preferences Individual Instruction;Skilled Demonstration;Group Instruction      Education Topics: General Nutrition Guidelines/Fats and Fiber: -Group instruction provided by verbal, written material, models and posters to present the general guidelines for heart healthy nutrition. Gives an explanation and review of dietary fats and fiber.   Cardiac Rehab from 12/23/2016 in [facility] Cardiac and Pulmonary Rehab  Date  11/04/16  Educator  CR  Instruction Review Code  2- meets goals/outcomes      Controlling Sodium/Reading Food Labels: -Group verbal and written material supporting the discussion of sodium use in heart healthy nutrition. Review and explanation with models, verbal and written materials for utilization of the food label.   Cardiac Rehab from 12/23/2016 in [facility] Cardiac and Pulmonary Rehab  Date  11/11/16  Educator  PI  Instruction Review Code  2- meets goals/outcomes      Exercise  Physiology & Risk Factors: - Group verbal and written instruction with models to review the exercise physiology of the cardiovascular system and associated critical values. Details cardiovascular disease risk factors and the goals associated with each risk factor.   Cardiac Rehab from 12/23/2016 in [facility] Cardiac and Pulmonary Rehab  Date  11/18/16  Educator  RG/AS  Instruction Review Code  2- meets goals/outcomes      Aerobic Exercise & Resistance Training: - Gives group verbal and written discussion on the health impact of inactivity. On the components of aerobic and resistive training  programs and the benefits of this training and how to safely progress through these programs.   Flexibility, Balance, General Exercise Guidelines: - Provides group verbal and written instruction on the benefits of flexibility and balance training programs. Provides general exercise guidelines with specific guidelines to those with heart or lung disease. Demonstration and skill practice provided.   Stress Management: - Provides group verbal and written instruction about the health risks of elevated stress, cause of high stress, and healthy ways to reduce stress.   Cardiac Rehab from 12/23/2016 in [facility] Cardiac and Pulmonary Rehab  Date  12/02/16  Educator  [facility]  Instruction Review Code  2- meets goals/outcomes      Depression: - Provides group verbal and written instruction on the correlation between heart/lung disease and depressed mood, treatment options, and the stigmas associated with seeking treatment.   Cardiac Rehab from 12/23/2016 in [facility] Cardiac and Pulmonary Rehab  Date  11/13/16  Educator  [facility]  Instruction Review Code  2- meets goals/outcomes      Anatomy & Physiology of the Heart: - Group verbal and written instruction and models provide basic cardiac anatomy and physiology, with the coronary electrical and arterial systems. Review of: AMI, Angina, Valve disease, Heart Failure, Cardiac  Arrhythmia, Pacemakers, and the ICD.   Cardiac Rehab from 12/23/2016 in [facility] Cardiac and Pulmonary Rehab  Date  12/04/16  Educator  [facility]  Instruction Review Code  2- meets goals/outcomes      Cardiac Procedures: - Group verbal and written instruction and models to describe the testing methods done to diagnose heart disease. Reviews the outcomes of the test results. Describes the treatment choices: Medical Management, Angioplasty, or Coronary Bypass Surgery.   Cardiac Rehab from 12/23/2016 in [facility] Cardiac and Pulmonary Rehab  Date  12/09/16  Educator  CE  Instruction Review Code  2- meets goals/outcomes      Cardiac Medications: - Group verbal and written instruction to review commonly prescribed medications for heart disease. Reviews the medication, class of the drug, and side effects. Includes the steps to properly store meds and maintain the prescription regimen.   Cardiac Rehab from 12/23/2016 in [facility] Cardiac and Pulmonary Rehab  Date  12/16/16 [8/7 Part One 8/9 Part Two]  Educator  SB  Instruction Review Code  2- meets goals/outcomes      Go Sex-Intimacy & Heart Disease, Get SMART - Goal Setting: - Group verbal and written instruction through game format to discuss heart disease and the return to sexual intimacy. Provides group verbal and written material to discuss and apply goal setting through the application of the S.M.A.R.T. Method.   Cardiac Rehab from 12/23/2016 in [facility] Cardiac and Pulmonary Rehab  Date  12/09/16  Educator  CE  Instruction Review Code  2- meets goals/outcomes      Other Matters of the Heart: - Provides group verbal, written materials and models to describe Heart Failure, Angina, Valve Disease, and Diabetes in the realm of heart disease. Includes description of the disease process and treatment options available to the cardiac patient.   Cardiac Rehab from 12/23/2016 in [facility] Cardiac and Pulmonary Rehab  Date  12/04/16  Educator  [facility]  Instruction Review Code   2- meets goals/outcomes      Exercise & Equipment Safety: - Individual verbal instruction and demonstration of equipment use and safety with use of the equipment.   Cardiac Rehab from 12/23/2016 in [facility] Cardiac and Pulmonary Rehab  Date  10/20/16  Educator  SB  Instruction Review  Code  2- meets goals/outcomes      Infection Prevention: - Provides verbal and written material to individual with discussion of infection control including proper hand washing and proper equipment cleaning during exercise session.   Cardiac Rehab from 12/23/2016 in [facility] Cardiac and Pulmonary Rehab  Date  10/20/16  Educator  Sb  Instruction Review Code  2- meets goals/outcomes      Falls Prevention: - Provides verbal and written material to individual with discussion of falls prevention and safety.   Cardiac Rehab from 12/23/2016 in [facility] Cardiac and Pulmonary Rehab  Date  10/20/16  Educator  Sb  Instruction Review Code  2- meets goals/outcomes      Diabetes: - Individual verbal and written instruction to review signs/symptoms of diabetes, desired ranges of glucose level fasting, after meals and with exercise. Advice that pre and post exercise glucose checks will be done for 3 sessions at entry of program.    Knowledge Questionnaire Score:     Knowledge Questionnaire Score - 10/20/16 1433      Knowledge Questionnaire Score   Pre Score 24/28  Reviewed correct responses with Malachy Mood. She verbalized understanding of the response.       Core Components/Risk Factors/Patient Goals at Admission:     Personal Goals and Risk Factors at Admission - 10/20/16 1423      Core Components/Risk Factors/Patient Goals on Admission    Weight Management Obesity;Yes;Weight Loss   Intervention Weight Management: Develop a combined nutrition and exercise program designed to reach desired caloric intake, while maintaining appropriate intake of nutrient and fiber, sodium and fats, and appropriate energy expenditure  required for the weight goal.;Weight Management: Provide education and appropriate resources to help participant work on and attain dietary goals.;Weight Management/Obesity: Establish reasonable short term and long term weight goals.;Obesity: Provide education and appropriate resources to help participant work on and attain dietary goals.   Admit Weight 208 lb (94.3 kg)   Goal Weight: Short Term 205 lb (93 kg)   Goal Weight: Long Term 175 lb (79.4 kg)   Expected Outcomes Short Term: Continue to assess and modify interventions until short term weight is achieved;Long Term: Adherence to nutrition and physical activity/exercise program aimed toward attainment of established weight goal;Weight Loss: Understanding of general recommendations for a balanced deficit meal plan, which promotes 1-2 lb weight loss per week and includes a negative energy balance of [phone] kcal/d;Understanding recommendations for meals to include 15-35% energy as protein, 25-35% energy from fat, 35-60% energy from carbohydrates, less than '200mg'$  of dietary cholesterol, 20-35 gm of total fiber daily;Understanding of distribution of calorie intake throughout the day with the consumption of 4-5 meals/snacks   Hypertension Yes   Intervention Provide education on lifestyle modifcations including regular physical activity/exercise, weight management, moderate sodium restriction and increased consumption of fresh fruit, vegetables, and low fat dairy, alcohol moderation, and smoking cessation.;Monitor prescription use compliance.   Expected Outcomes Short Term: Continued assessment and intervention until BP is < 140/37m HG in hypertensive participants. < 130/862mHG in hypertensive participants with diabetes, heart failure or chronic kidney disease.;Long Term: Maintenance of blood pressure at goal levels.   Lipids Yes   Intervention Provide education and support for participant on nutrition & aerobic/resistive exercise along with prescribed  medications to achieve LDL '70mg'$ , HDL >'40mg'$ .   Expected Outcomes Short Term: Participant states understanding of desired cholesterol values and is compliant with medications prescribed. Participant is following exercise prescription and nutrition guidelines.;Long Term: Cholesterol controlled with medications as prescribed, with  individualized exercise RX and with personalized nutrition plan. Value goals: LDL < '70mg'$ , HDL > 40 mg.      Core Components/Risk Factors/Patient Goals Review:      Goals and Risk Factor Review    Row Name 11/06/16 1034 12/11/16 0853           Core Components/Risk Factors/Patient Goals Review   Personal Goals Review Weight Management/Obesity;Hypertension;Lipids;Heart Failure Weight Management/Obesity;Hypertension      Review Rayme's weight has been holding steady and she weighs herself daily at home.  She continues to have some SOB but it is improving as she is able to do more at home.  She had a follow up appointment today with the [facility].   Her blood pressures have been good and she has not had any problems with taking any of her medications.   Makaylen has had BP readings within normal range during class.  She is off her BP med due to hair loss.  She has met with the RD and feels she knows what to eat and eats well but weight loss is a challenge.  She is feeling "almost back to normal".      Expected Outcomes Short: Continue to stay on top of her heart failure symptoms.  Long: Continue to work on risk factor modifications. Short - Maki will continue to attend class regularly.  Long - Preethi will complete HT and continue healthy habits.          Core Components/Risk Factors/Patient Goals at Discharge (Final Review):      Goals and Risk Factor Review - 12/11/16 0853      Core Components/Risk Factors/Patient Goals Review   Personal Goals Review Weight Management/Obesity;Hypertension   Review Lesa has had BP readings within normal range during class.   She is off her BP med due to hair loss.  She has met with the RD and feels she knows what to eat and eats well but weight loss is a challenge.  She is feeling "almost back to normal".   Expected Outcomes Short - Raissa will continue to attend class regularly.  Long - Raiana will complete HT and continue healthy habits.       ITP Comments:     ITP Comments    Row Name 10/20/16 1405 10/29/16 0552 11/26/16 0642 12/24/16 0614     ITP Comments Medical Review completed . Inital ITP created. Documentation of diagnosis can be found in [facility]  Admission 09/12/2016. 30 day review. Continue with ITP unless directed changes per Medical Director review. New to program 30 day review. Continue with ITP unless directed changes per Medical Director review    30 day review. Continue with ITP unless directed changes per Medical Director review        Comments:

## 2016-12-25 ENCOUNTER — Encounter: Payer: Medicare Other | Admitting: *Deleted

## 2016-12-25 DIAGNOSIS — I214 Non-ST elevation (NSTEMI) myocardial infarction: Secondary | ICD-10-CM | POA: Diagnosis not present

## 2016-12-25 DIAGNOSIS — Z79899 Other long term (current) drug therapy: Secondary | ICD-10-CM | POA: Diagnosis not present

## 2016-12-25 NOTE — Progress Notes (Signed)
Daily Session Note  Patient Details  Name: Darlene Lawrence MRN: [id number] Date of Birth: 1940/12/27 Referring Provider:     Cardiac Rehab from 10/20/2016 in [facility] Cardiac and Pulmonary Rehab  Referring Provider  Bartholome Bill MD      Encounter Date: 12/25/2016  Check In:     Session Check In - 12/25/16 0850      Check-In   Location [facility]-Cardiac & Pulmonary Rehab   Staff Present Alberteen Sam, MA, ACSM RCEP, Exercise Physiologist;Amanda Oletta Darter, BA, ACSM CEP, Exercise Physiologist;Meredith Sherryll Burger, RN BSN   Supervising physician immediately available to respond to emergencies See telemetry face sheet for immediately available ER MD   Medication changes reported     No   Fall or balance concerns reported    No   Warm-up and Cool-down Performed on first and last piece of equipment   Resistance Training Performed Yes   VAD Patient? No     Pain Assessment   Currently in Pain? No/denies   Multiple Pain Sites No         History  Smoking Status  . Never Smoker  Smokeless Tobacco  . Never Used    Goals Met:  Independence with exercise equipment Exercise tolerated well No report of cardiac concerns or symptoms Strength training completed today  Goals Unmet:  Not Applicable  Comments: Pt able to follow exercise prescription today without complaint.  Will continue to monitor for progression.    Dr. Emily Filbert is Medical Director for [place] and [facility].

## 2016-12-30 VITALS — Ht 65.8 in | Wt 208.0 lb

## 2016-12-30 DIAGNOSIS — Z79899 Other long term (current) drug therapy: Secondary | ICD-10-CM | POA: Diagnosis not present

## 2016-12-30 DIAGNOSIS — I214 Non-ST elevation (NSTEMI) myocardial infarction: Secondary | ICD-10-CM | POA: Diagnosis not present

## 2016-12-30 NOTE — Progress Notes (Signed)
Daily Session Note  Patient Details  Name: Darlene Lawrence MRN: [id number] Date of Birth: [birth year] Referring Provider:     Cardiac Rehab from 10/20/2016 in [facility] Cardiac and Pulmonary Rehab  Referring Provider  Bartholome Bill MD      Encounter Date: 12/30/2016  Check In:     Session Check In - 12/30/16 0831      Check-In   Location [facility]-Cardiac & Pulmonary Rehab   Staff Present Heath Lark, RN, BSN, CCRP;Embree Brawley Luan Pulling, MA, ACSM RCEP, Exercise Physiologist;Amanda Oletta Darter, BA, ACSM CEP, Exercise Physiologist   Supervising physician immediately available to respond to emergencies See telemetry face sheet for immediately available ER MD   Medication changes reported     No   Fall or balance concerns reported    No   Warm-up and Cool-down Performed on first and last piece of equipment   Resistance Training Performed Yes   VAD Patient? No     Pain Assessment   Currently in Pain? No/denies         History  Smoking Status  . Never Smoker  Smokeless Tobacco  . Never Used    Goals Met:  Independence with exercise equipment Exercise tolerated well No report of cardiac concerns or symptoms Strength training completed today  Goals Unmet:  Not Applicable  Comments: Pt able to follow exercise prescription today without complaint.  Will continue to monitor for progression.     [place] Name 10/20/16 1454 12/30/16 0938       6 Minute Walk   Phase Initial Discharge    Distance 1060 feet 1332 feet    Distance % Change  - 25.7 %  272 ft    Walk Time 6 minutes 6 minutes    # of Rest Breaks 0 0    MPH 2 2.52    METS 1.79 2.59    RPE 13 13    Perceived Dyspnea  1  -    VO2 Peak 6.28 9.07    Symptoms Yes (comment) No    Comments slightly SOB, no pain  -    Resting HR 44 bpm 64 bpm    Resting BP 126/64 124/72    Max Ex. HR 93 bpm 119 bpm    Max Ex. BP 132/66 136/68    2 Minute Post BP 126/70  -         Dr. Emily Filbert is Medical Director for  [place] and [facility].

## 2016-12-31 ENCOUNTER — Encounter: Payer: Self-pay | Admitting: Podiatry

## 2016-12-31 ENCOUNTER — Ambulatory Visit (INDEPENDENT_AMBULATORY_CARE_PROVIDER_SITE_OTHER): Payer: Medicare Other | Admitting: Podiatry

## 2016-12-31 DIAGNOSIS — D3613 Benign neoplasm of peripheral nerves and autonomic nervous system of lower limb, including hip: Secondary | ICD-10-CM | POA: Diagnosis not present

## 2016-12-31 NOTE — Progress Notes (Signed)
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 States that he was doing okay for a while and then all of a sudden I was in the hospital and he started to come back.  Objective: Vital signs are stable she is alert and oriented 3 she has pain on palpation third interdigital space of the left foot with a palpable Mulder's click.  Assessment: Pain limb secondary to neuroma.  Plan: Injected another dose dehydrated alcohol to the left third interdigital space.

## 2017-01-01 ENCOUNTER — Encounter: Payer: Medicare Other | Admitting: *Deleted

## 2017-01-01 DIAGNOSIS — I214 Non-ST elevation (NSTEMI) myocardial infarction: Secondary | ICD-10-CM | POA: Diagnosis not present

## 2017-01-01 DIAGNOSIS — Z79899 Other long term (current) drug therapy: Secondary | ICD-10-CM | POA: Diagnosis not present

## 2017-01-01 NOTE — Progress Notes (Signed)
Daily Session Note  Patient Details  Name: Darlene Lawrence MRN: [id number] Date of Birth: [birth year] Referring Provider:     Cardiac Rehab from 10/20/2016 in [facility] Cardiac and Pulmonary Rehab  Referring Provider  Bartholome Bill MD      Encounter Date: 01/01/2017  Check In:     Session Check In - 01/01/17 0925      Check-In   Location [facility]-Cardiac & Pulmonary Rehab   Staff Present Alberteen Sam, MA, ACSM RCEP, Exercise Physiologist;Amanda Oletta Darter, BA, ACSM CEP, Exercise Physiologist;Meredith Sherryll Burger, RN BSN   Supervising physician immediately available to respond to emergencies See telemetry face sheet for immediately available ER MD   Medication changes reported     No   Fall or balance concerns reported    No   Warm-up and Cool-down Performed on first and last piece of equipment   Resistance Training Performed Yes   VAD Patient? No     Pain Assessment   Currently in Pain? No/denies   Multiple Pain Sites No           Exercise Prescription Changes - 12/31/16 1500      Response to Exercise   Blood Pressure (Admit) 124/72   Blood Pressure (Exercise) 136/68   Blood Pressure (Exit) 122/72   Heart Rate (Admit) 64 bpm   Heart Rate (Exercise) 119 bpm   Heart Rate (Exit) 78 bpm   Rating of Perceived Exertion (Exercise) 13   Symptoms none   Duration Continue with 45 min of aerobic exercise without signs/symptoms of physical distress.   Intensity THRR unchanged     Progression   Progression Continue to progress workloads to maintain intensity without signs/symptoms of physical distress.   Average METs 3.1     Resistance Training   Training Prescription Yes   Weight 3 lb   Reps 10-15     Interval Training   Interval Training No     Treadmill   MPH 2.2   Grade 1   Minutes 15   METs 2.99     NuStep   Level 5   Minutes 15   METs 2.9     REL-XR   Level 4   Minutes 15   METs 3.4     Home Exercise Plan   Plans to continue exercise at [facility]  (comment)  walking, fitness center in complex, [employer]   Frequency Add 2 additional days to program exercise sessions.   Initial Home Exercises Provided 11/04/16      History  Smoking Status  . Never Smoker  Smokeless Tobacco  . Never Used    Goals Met:  Independence with exercise equipment Exercise tolerated well No report of cardiac concerns or symptoms Strength training completed today  Goals Unmet:  Not Applicable  Comments: Pt able to follow exercise prescription today without complaint.  Will continue to monitor for progression.    Dr. Emily Filbert is Medical Director for [place] and [facility].

## 2017-01-06 ENCOUNTER — Encounter: Payer: Medicare Other | Admitting: *Deleted

## 2017-01-06 DIAGNOSIS — I214 Non-ST elevation (NSTEMI) myocardial infarction: Secondary | ICD-10-CM | POA: Diagnosis not present

## 2017-01-06 DIAGNOSIS — Z79899 Other long term (current) drug therapy: Secondary | ICD-10-CM | POA: Diagnosis not present

## 2017-01-06 NOTE — Progress Notes (Signed)
Daily Session Note  Patient Details  Name: Darlene Lawrence MRN: [id number] Date of Birth: 03/29/1941 Referring Provider:     Cardiac Rehab from 10/20/2016 in [facility] Cardiac and Pulmonary Rehab  Referring Provider  Bartholome Bill MD      Encounter Date: 01/06/2017  Check In:     Session Check In - 01/06/17 0835      Check-In   Location [facility]-Cardiac & Pulmonary Rehab   Staff Present Heath Lark, RN, BSN, CCRP;Jessica Luan Pulling, MA, ACSM RCEP, Exercise Physiologist;Amanda Oletta Darter, BA, ACSM CEP, Exercise Physiologist;Joseph Flavia Shipper   Supervising physician immediately available to respond to emergencies See telemetry face sheet for immediately available ER MD   Medication changes reported     No   Fall or balance concerns reported    No   Warm-up and Cool-down Performed on first and last piece of equipment   Resistance Training Performed Yes   VAD Patient? No     Pain Assessment   Currently in Pain? No/denies   Multiple Pain Sites No         History  Smoking Status  . Never Smoker  Smokeless Tobacco  . Never Used    Goals Met:  Independence with exercise equipment Exercise tolerated well Personal goals reviewed No report of cardiac concerns or symptoms Strength training completed today  Goals Unmet:  Not Applicable  Comments: Pt able to follow exercise prescription today without complaint.  Will continue to monitor for progression.   Dr. Emily Filbert is Medical Director for [place] and [facility].

## 2017-01-13 ENCOUNTER — Encounter: Payer: Medicare Other | Attending: Cardiology

## 2017-01-13 DIAGNOSIS — I214 Non-ST elevation (NSTEMI) myocardial infarction: Secondary | ICD-10-CM | POA: Diagnosis not present

## 2017-01-13 DIAGNOSIS — Z79899 Other long term (current) drug therapy: Secondary | ICD-10-CM | POA: Diagnosis not present

## 2017-01-13 NOTE — Progress Notes (Signed)
Daily Session Note  Patient Details  Name: Darlene Lawrence MRN: [id number] Date of Birth: [birth year] Referring Provider:     Cardiac Rehab from 10/20/2016 in [facility] Cardiac and Pulmonary Rehab  Referring Provider  Bartholome Bill MD      Encounter Date: 01/13/2017  Check In:     Session Check In - 01/13/17 0945      Check-In   Location [facility]-Cardiac & Pulmonary Rehab   Staff Present Nada Maclachlan, BA, ACSM CEP, Exercise Physiologist;Susanne Bice, RN, BSN, CCRP;Jessica Luan Pulling, MA, ACSM RCEP, Exercise Physiologist   Supervising physician immediately available to respond to emergencies See telemetry face sheet for immediately available ER MD   Medication changes reported     No   Fall or balance concerns reported    No   Warm-up and Cool-down Performed on first and last piece of equipment   Resistance Training Performed Yes   VAD Patient? No     Pain Assessment   Currently in Pain? No/denies         History  Smoking Status  . Never Smoker  Smokeless Tobacco  . Never Used    Goals Met:  Independence with exercise equipment Exercise tolerated well No report of cardiac concerns or symptoms Strength training completed today  Goals Unmet:  Not Applicable  Comments: Reviewed RPE scale, THR and program prescription with pt today.  Pt voiced understanding and was given a copy of goals to take home.   Short: Use RPE daily to regulate intensity.  Long: Follow program prescription in THR.    Dr. Emily Filbert is Medical Director for [place] and [facility].

## 2017-01-14 DIAGNOSIS — M62830 Muscle spasm of back: Secondary | ICD-10-CM | POA: Diagnosis not present

## 2017-01-14 DIAGNOSIS — M5136 Other intervertebral disc degeneration, lumbar region: Secondary | ICD-10-CM | POA: Diagnosis not present

## 2017-01-14 DIAGNOSIS — M9903 Segmental and somatic dysfunction of lumbar region: Secondary | ICD-10-CM | POA: Diagnosis not present

## 2017-01-14 DIAGNOSIS — M9902 Segmental and somatic dysfunction of thoracic region: Secondary | ICD-10-CM | POA: Diagnosis not present

## 2017-01-15 DIAGNOSIS — I214 Non-ST elevation (NSTEMI) myocardial infarction: Secondary | ICD-10-CM

## 2017-01-15 DIAGNOSIS — Z79899 Other long term (current) drug therapy: Secondary | ICD-10-CM | POA: Diagnosis not present

## 2017-01-15 NOTE — Patient Instructions (Signed)
Discharge Instructions  Patient Details  Name: Darlene Lawrence MRN: [id number] Date of Birth: [birth year] Referring Provider:  Teodoro Spray, MD   Number of Visits: 40  Reason for Discharge:  Patient reached a stable level of exercise. Patient independent in their exercise. Patient has met program and personal goals.  Smoking History:  History  Smoking Status  . Never Smoker  Smokeless Tobacco  . Never Used    Diagnosis:  NSTEMI (non-ST elevated myocardial infarction) [facility])  Initial Exercise Prescription:     Initial Exercise Prescription - 10/20/16 1400      Date of Initial Exercise RX and Referring Provider   Date 10/20/16   Referring Provider Bartholome Bill MD     Treadmill   MPH 1   Grade 0.5   Minutes 15   METs 1.83     NuStep   Level 1   SPM 80   Minutes 15   METs 1.7     REL-XR   Level 1   Speed 50   Minutes 15   METs 1.7     Prescription Details   Frequency (times per week) 2   Duration Progress to 45 minutes of aerobic exercise without signs/symptoms of physical distress     Intensity   THRR 40-80% of Max Heartrate 85-126   Ratings of Perceived Exertion 11-13   Perceived Dyspnea 0-4     Progression   Progression Continue to progress workloads to maintain intensity without signs/symptoms of physical distress.     Resistance Training   Training Prescription Yes   Weight 2 lbs   Reps 10-15      Discharge Exercise Prescription (Final Exercise Prescription Changes):     Exercise Prescription Changes - 01/13/17 1600      Response to Exercise   Blood Pressure (Admit) 124/74   Blood Pressure (Exercise) 146/74   Blood Pressure (Exit) 128/70   Heart Rate (Admit) 67 bpm   Heart Rate (Exercise) 94 bpm   Heart Rate (Exit) 58 bpm   Rating of Perceived Exertion (Exercise) 13   Symptoms none   Duration Continue with 45 min of aerobic exercise without signs/symptoms of physical distress.   Intensity THRR unchanged     Progression   Progression Continue to progress workloads to maintain intensity without signs/symptoms of physical distress.   Average METs 2.76     Resistance Training   Training Prescription Yes   Weight 3 lb   Reps 10-15     Interval Training   Interval Training No     Treadmill   MPH 2.2   Grade 1   Minutes 15   METs 2.99     NuStep   Level 5   Minutes 15   METs 2.9     REL-XR   Level 4   Minutes 15   METs 2.4     Home Exercise Plan   Plans to continue exercise at [facility] (comment)  walking, fitness center in complex, [employer]   Frequency Add 2 additional days to program exercise sessions.   Initial Home Exercises Provided 11/04/16      Functional Capacity:     6 Minute Walk    Row Name 10/20/16 1454 12/30/16 0938       6 Minute Walk   Phase Initial Discharge    Distance 1060 feet 1332 feet    Distance % Change  - 25.7 %  272 ft    Walk Time 6 minutes 6 minutes    #  of Rest Breaks 0 0    MPH 2 2.52    METS 1.79 2.59    RPE 13 13    Perceived Dyspnea  1  -    VO2 Peak 6.28 9.07    Symptoms Yes (comment) No    Comments slightly SOB, no pain  -    Resting HR 44 bpm 64 bpm    Resting BP 126/64 124/72    Max Ex. HR 93 bpm 119 bpm    Max Ex. BP 132/66 136/68    2 Minute Post BP 126/70  -       Quality of Life:     Quality of Life - 12/25/16 0851      Quality of Life Scores   Health/Function Pre 20.8 %   Health/Function Post 24.6 %   Health/Function % Change 18.27 %   Socioeconomic Pre 28.8 %   Socioeconomic Post 25 %   Socioeconomic % Change  -13.19 %   Psych/Spiritual Pre 22.29 %   Psych/Spiritual Post 23.14 %   Psych/Spiritual % Change 3.81 %   Family Pre 28.8 %   Family Post 24 %   Family % Change -16.67 %   GLOBAL Pre 23.63 %   GLOBAL Post 24.27 %   GLOBAL % Change 2.71 %      Personal Goals: Goals established at orientation with interventions provided to work toward goal.     Personal Goals and Risk Factors at Admission -  10/20/16 1423      Core Components/Risk Factors/Patient Goals on Admission    Weight Management Obesity;Yes;Weight Loss   Intervention Weight Management: Develop a combined nutrition and exercise program designed to reach desired caloric intake, while maintaining appropriate intake of nutrient and fiber, sodium and fats, and appropriate energy expenditure required for the weight goal.;Weight Management: Provide education and appropriate resources to help participant work on and attain dietary goals.;Weight Management/Obesity: Establish reasonable short term and long term weight goals.;Obesity: Provide education and appropriate resources to help participant work on and attain dietary goals.   Admit Weight 208 lb (94.3 kg)   Goal Weight: Short Term 205 lb (93 kg)   Goal Weight: Long Term 175 lb (79.4 kg)   Expected Outcomes Short Term: Continue to assess and modify interventions until short term weight is achieved;Long Term: Adherence to nutrition and physical activity/exercise program aimed toward attainment of established weight goal;Weight Loss: Understanding of general recommendations for a balanced deficit meal plan, which promotes 1-2 lb weight loss per week and includes a negative energy balance of [phone] kcal/d;Understanding recommendations for meals to include 15-35% energy as protein, 25-35% energy from fat, 35-60% energy from carbohydrates, less than 250m of dietary cholesterol, 20-35 gm of total fiber daily;Understanding of distribution of calorie intake throughout the day with the consumption of 4-5 meals/snacks   Hypertension Yes   Intervention Provide education on lifestyle modifcations including regular physical activity/exercise, weight management, moderate sodium restriction and increased consumption of fresh fruit, vegetables, and low fat dairy, alcohol moderation, and smoking cessation.;Monitor prescription use compliance.   Expected Outcomes Short Term: Continued assessment and  intervention until BP is < 140/953mHG in hypertensive participants. < 130/8042mG in hypertensive participants with diabetes, heart failure or chronic kidney disease.;Long Term: Maintenance of blood pressure at goal levels.   Lipids Yes   Intervention Provide education and support for participant on nutrition & aerobic/resistive exercise along with prescribed medications to achieve LDL <78m50mDL >40mg62mExpected Outcomes Short Term:  Participant states understanding of desired cholesterol values and is compliant with medications prescribed. Participant is following exercise prescription and nutrition guidelines.;Long Term: Cholesterol controlled with medications as prescribed, with individualized exercise RX and with personalized nutrition plan. Value goals: LDL < 35m, HDL > 40 mg.       Personal Goals Discharge:     Goals and Risk Factor Review - 01/06/17 1036      Core Components/Risk Factors/Patient Goals Review   Personal Goals Review Weight Management/Obesity;Hypertension;Lipids   Review CHelinacontinues to do well in rehab.  Her blood pressures have continued to do well despite having come off of her medications.   She is still on her Crestor twice a week, which seems to be working for her.  Her weight continues to come down as well.  She is using Weight [employer] online.  She is down 13 lbs since her admission and is determined to keep it off this time.   Expected Outcomes Short: Continue to follow Weight Watchers.  Long: Continue to build these healthy habits into her everyday life.       Exercise Goals and Review:     Exercise Goals    Row Name 10/20/16 1501             Exercise Goals   Increase Physical Activity Yes       Intervention Provide advice, education, support and counseling about physical activity/exercise needs.;Develop an individualized exercise prescription for aerobic and resistive training based on initial evaluation findings, risk stratification,  comorbidities and participant's personal goals.       Expected Outcomes Achievement of increased cardiorespiratory fitness and enhanced flexibility, muscular endurance and strength shown through measurements of functional capacity and personal statement of participant.       Increase Strength and Stamina Yes       Intervention Provide advice, education, support and counseling about physical activity/exercise needs.;Develop an individualized exercise prescription for aerobic and resistive training based on initial evaluation findings, risk stratification, comorbidities and participant's personal goals.       Expected Outcomes Achievement of increased cardiorespiratory fitness and enhanced flexibility, muscular endurance and strength shown through measurements of functional capacity and personal statement of participant.          Nutrition & Weight - Outcomes:     Pre Biometrics - 10/20/16 1502      Pre Biometrics   Height 5' 5.8" (1.671 m)   Weight 211 lb 6.4 oz (95.9 kg)   Waist Circumference 40 inches   Hip Circumference 51 inches   Waist to Hip Ratio 0.78 %   BMI (Calculated) 34.4   Single Leg Stand 0.58 seconds         Post Biometrics - 12/30/16 0939       Post  Biometrics   Height 5' 5.8" (1.671 m)   Weight 208 lb (94.3 kg)   Waist Circumference 38 inches   Hip Circumference 49.5 inches   Waist to Hip Ratio 0.77 %   BMI (Calculated) 33.79   Single Leg Stand 1.55 seconds      Nutrition:     Nutrition Therapy & Goals - 11/11/16 1223      Nutrition Therapy   Diet Instructed on a meal plan based on 1400 calories and heart healthy dietary guidelines   Protein (specify units) 6   Fiber 20 grams   Whole Grain Foods 3 servings   Saturated Fats 10 max. grams   Fruits and Vegetables 5 servings/day   Sodium 1500  grams     Personal Nutrition Goals   Nutrition Goal Read labels for saturated fat, trans fat and sodium.   Personal Goal #2 Look at sodium content of cottage  cheese.   Personal Goal #3 Balance meals with 1-3 oz protein, 2-3 servings of carbohydrate and non-starchy vegetables.     Intervention Plan   Intervention Prescribe, educate and counsel regarding individualized specific dietary modifications aiming towards targeted core components such as weight, hypertension, lipid management, diabetes, heart failure and other comorbidities.;Nutrition handout(s) given to patient.   Expected Outcomes Short Term Goal: Understand basic principles of dietary content, such as calories, fat, sodium, cholesterol and nutrients.;Short Term Goal: A plan has been developed with personal nutrition goals set during dietitian appointment.;Long Term Goal: Adherence to prescribed nutrition plan.      Nutrition Discharge:     Nutrition Assessments - 12/25/16 0851      MEDFICTS Scores   Pre Score 38   Post Score 34   Score Difference -4      Education Questionnaire Score:     Knowledge Questionnaire Score - 12/25/16 0851      Knowledge Questionnaire Score   Pre Score 24/28   Post Score 28/28      Goals reviewed with patient; copy given to patient.

## 2017-01-15 NOTE — Progress Notes (Signed)
Daily Session Note  Patient Details  Name: Darlene Lawrence MRN: [id number] Date of Birth: 1940-06-08 Referring Provider:     Cardiac Rehab from 10/20/2016 in [facility] Cardiac and Pulmonary Rehab  Referring Provider  Bartholome Bill MD      Encounter Date: 01/15/2017  Check In:     Session Check In - 01/15/17 0837      Check-In   Location [facility]-Cardiac & Pulmonary Rehab   Staff Present Alberteen Sam, MA, ACSM RCEP, Exercise Physiologist;Amanda Oletta Darter, BA, ACSM CEP, Exercise Physiologist;Meredith Sherryll Burger, RN BSN   Supervising physician immediately available to respond to emergencies See telemetry face sheet for immediately available ER MD   Medication changes reported     No   Fall or balance concerns reported    No   Warm-up and Cool-down Performed on first and last piece of equipment   Resistance Training Performed Yes   VAD Patient? No     Pain Assessment   Currently in Pain? No/denies         History  Smoking Status  . Never Smoker  Smokeless Tobacco  . Never Used    Goals Met:  Independence with exercise equipment Exercise tolerated well No report of cardiac concerns or symptoms Strength training completed today  Goals Unmet:  Not Applicable  Comments: Pt able to follow exercise prescription today without complaint.  Will continue to monitor for progression.    Dr. Emily Filbert is Medical Director for [facility] and [facility].

## 2017-01-20 DIAGNOSIS — Z79899 Other long term (current) drug therapy: Secondary | ICD-10-CM | POA: Diagnosis not present

## 2017-01-20 DIAGNOSIS — I214 Non-ST elevation (NSTEMI) myocardial infarction: Secondary | ICD-10-CM | POA: Diagnosis not present

## 2017-01-20 NOTE — Progress Notes (Signed)
Daily Session Note  Patient Details  Name: Darlene Lawrence MRN: [id number] Date of Birth: February 28, 1941 Referring Provider:     Cardiac Rehab from 10/20/2016 in [facility] Cardiac and Pulmonary Rehab  Referring Provider  Bartholome Bill MD      Encounter Date: 01/20/2017  Check In:     Session Check In - 01/20/17 0846      Check-In   Location [facility]-Cardiac & Pulmonary Rehab   Staff Present Heath Lark, RN, BSN, CCRP;Chikita Dogan Luan Pulling, MA, ACSM RCEP, Exercise Physiologist;Amanda Oletta Darter, BA, ACSM CEP, Exercise Physiologist   Supervising physician immediately available to respond to emergencies See telemetry face sheet for immediately available ER MD   Medication changes reported     No   Fall or balance concerns reported    No   Warm-up and Cool-down Performed on first and last piece of equipment   Resistance Training Performed Yes   VAD Patient? No     Pain Assessment   Currently in Pain? No/denies         History  Smoking Status  . Never Smoker  Smokeless Tobacco  . Never Used    Goals Met:  Independence with exercise equipment Exercise tolerated well Personal goals reviewed No report of cardiac concerns or symptoms Strength training completed today  Goals Unmet:  Not Applicable  Comments:   Darlene Lawrence graduated today from cardiac rehab with 36/36 sessions completed.  Details of the patient's exercise prescription and what She needs to do in order to continue the prescription and progress were discussed with patient.  Patient was given a copy of prescription and goals.  Patient verbalized understanding.  Darlene Lawrence plans to continue to exercise by going to [employer].     Dr. Emily Filbert is Medical Director for [place] and [facility].

## 2017-01-20 NOTE — Progress Notes (Signed)
Discharge Progress Report  Patient Details  Name: Darlene Lawrence MRN: [id number] Date of Birth: 05/01/1941 Referring Provider:     Cardiac Rehab from 10/20/2016 in [facility]  Referring Provider  Bartholome Bill MD       Number of Visits: 36/36  Reason for Discharge:  Patient reached a stable level of exercise. Patient independent in their exercise. Patient has met program and personal goals.  Smoking History:  History  Smoking Status  . Never Smoker  Smokeless Tobacco  . Never Used    Diagnosis:  NSTEMI (non-ST elevated myocardial infarction) ([place])  ADL [facility]:   Initial Exercise Prescription:     Initial Exercise Prescription - 10/20/16 1400      Date of Initial Exercise RX and Referring Provider   Date 10/20/16   Referring Provider Bartholome Bill MD     Treadmill   MPH 1   Grade 0.5   Minutes 15   METs 1.83     NuStep   Level 1   SPM 80   Minutes 15   METs 1.7     REL-XR   Level 1   Speed 50   Minutes 15   METs 1.7     Prescription Details   Frequency (times per week) 2   Duration Progress to 45 minutes of aerobic exercise without signs/symptoms of physical distress     Intensity   THRR 40-80% of Max Heartrate 85-126   Ratings of Perceived Exertion 11-13   Perceived Dyspnea 0-4     Progression   Progression Continue to progress workloads to maintain intensity without signs/symptoms of physical distress.     Resistance Training   Training Prescription Yes   Weight 2 lbs   Reps 10-15      Discharge Exercise Prescription (Final Exercise Prescription Changes):     Exercise Prescription Changes - 01/13/17 1600      Response to Exercise   Blood Pressure (Admit) 124/74   Blood Pressure (Exercise) 146/74   Blood Pressure (Exit) 128/70   Heart Rate (Admit) 67 bpm   Heart Rate (Exercise) 94 bpm   Heart Rate (Exit) 58 bpm   Rating of Perceived Exertion (Exercise) 13   Symptoms none   Duration Continue with 45 min of  aerobic exercise without signs/symptoms of physical distress.   Intensity THRR unchanged     Progression   Progression Continue to progress workloads to maintain intensity without signs/symptoms of physical distress.   Average METs 2.76     Resistance Training   Training Prescription Yes   Weight 3 lb   Reps 10-15     Interval Training   Interval Training No     Treadmill   MPH 2.2   Grade 1   Minutes 15   METs 2.99     NuStep   Level 5   Minutes 15   METs 2.9     REL-XR   Level 4   Minutes 15   METs 2.4     Home Exercise Plan   Plans to continue exercise at [facility] (comment)  walking, fitness center in complex, [place]   Frequency Add 2 additional days to program exercise sessions.   Initial Home Exercises Provided 11/04/16      Functional Capacity:     6 Minute Walk    Row Name 10/20/16 1454 12/30/16 0938       6 Minute Walk   Phase Initial Discharge    Distance 1060 feet  1332 feet    Distance % Change  - 25.7 %  272 ft    Walk Time 6 minutes 6 minutes    # of Rest Breaks 0 0    MPH 2 2.52    METS 1.79 2.59    RPE 13 13    Perceived Dyspnea  1  -    VO2 Peak 6.28 9.07    Symptoms Yes (comment) No    Comments slightly SOB, no pain  -    Resting HR 44 bpm 64 bpm    Resting BP 126/64 124/72    Max Ex. HR 93 bpm 119 bpm    Max Ex. BP 132/66 136/68    2 Minute Post BP 126/70  -       Psychological, QOL, Others - Outcomes: PHQ 2/9: Depression screen [facility] 2/9 12/25/2016 11/06/2016 11/03/2016 10/20/2016 10/16/2016  Decreased Interest 0 0 0 0 0  Down, Depressed, Hopeless 0 0 0 0 0  PHQ - 2 Score 0 0 0 0 0  Altered sleeping 0 - - 1 -  Tired, decreased energy 2 - - 1 -  Change in appetite 0 - - 0 -  Feeling bad or failure about yourself  0 - - 0 -  Trouble concentrating 0 - - 0 -  Moving slowly or fidgety/restless 0 - - 0 -  Suicidal thoughts 0 - - 0 -  PHQ-9 Score 2 - - 2 -  Difficult doing work/chores Somewhat difficult - - Somewhat  difficult -    Quality of Life:     Quality of Life - 12/25/16 0851      Quality of Life Scores   Health/Function Pre 20.8 %   Health/Function Post 24.6 %   Health/Function % Change 18.27 %   Socioeconomic Pre 28.8 %   Socioeconomic Post 25 %   Socioeconomic % Change  -13.19 %   Psych/Spiritual Pre 22.29 %   Psych/Spiritual Post 23.14 %   Psych/Spiritual % Change 3.81 %   Family Pre 28.8 %   Family Post 24 %   Family % Change -16.67 %   GLOBAL Pre 23.63 %   GLOBAL Post 24.27 %   GLOBAL % Change 2.71 %      Personal Goals: Goals established at orientation with interventions provided to work toward goal.     Personal Goals and Risk Factors at Admission - 10/20/16 1423      Core Components/Risk Factors/Patient Goals on Admission    Weight Management Obesity;Yes;Weight Loss   Intervention Weight Management: Develop a combined nutrition and exercise program designed to reach desired caloric intake, while maintaining appropriate intake of nutrient and fiber, sodium and fats, and appropriate energy expenditure required for the weight goal.;Weight Management: Provide education and appropriate resources to help participant work on and attain dietary goals.;Weight Management/Obesity: Establish reasonable short term and long term weight goals.;Obesity: Provide education and appropriate resources to help participant work on and attain dietary goals.   Admit Weight 208 lb (94.3 kg)   Goal Weight: Short Term 205 lb (93 kg)   Goal Weight: Long Term 175 lb (79.4 kg)   Expected Outcomes Short Term: Continue to assess and modify interventions until short term weight is achieved;Long Term: Adherence to nutrition and physical activity/exercise program aimed toward attainment of established weight goal;Weight Loss: Understanding of general recommendations for a balanced deficit meal plan, which promotes 1-2 lb weight loss per week and includes a negative energy balance of [phone]  kcal/d;Understanding  recommendations for meals to include 15-35% energy as protein, 25-35% energy from fat, 35-60% energy from carbohydrates, less than 280m of dietary cholesterol, 20-35 gm of total fiber daily;Understanding of distribution of calorie intake throughout the day with the consumption of 4-5 meals/snacks   Hypertension Yes   Intervention Provide education on lifestyle modifcations including regular physical activity/exercise, weight management, moderate sodium restriction and increased consumption of fresh fruit, vegetables, and low fat dairy, alcohol moderation, and smoking cessation.;Monitor prescription use compliance.   Expected Outcomes Short Term: Continued assessment and intervention until BP is < 140/932mHG in hypertensive participants. < 130/8065mG in hypertensive participants with diabetes, heart failure or chronic kidney disease.;Long Term: Maintenance of blood pressure at goal levels.   Lipids Yes   Intervention Provide education and support for participant on nutrition & aerobic/resistive exercise along with prescribed medications to achieve LDL <14m80mDL >40mg83mExpected Outcomes Short Term: Participant states understanding of desired cholesterol values and is compliant with medications prescribed. Participant is following exercise prescription and nutrition guidelines.;Long Term: Cholesterol controlled with medications as prescribed, with individualized exercise RX and with personalized nutrition plan. Value goals: LDL < 14mg,60m > 40 mg.       Personal Goals Discharge:     Goals and Risk Factor Review    Row Name 11/06/16 1034 12/11/16 0853 01/06/17 1036         Core Components/Risk Factors/Patient Goals Review   Personal Goals Review Weight Management/Obesity;Hypertension;Lipids;Heart Failure Weight Management/Obesity;Hypertension Weight Management/Obesity;Hypertension;Lipids     Review Elivia's weight has been holding steady and she weighs herself daily at  home.  She continues to have some SOB but it is improving as she is able to do more at home.  [facility] and she has not had any problems with taking any of her medications.   CherylJasimead BP readings within normal range during class.  She is off her BP med due to hair loss.  She has met with the RD and feels she knows what to eat and eats well but weight loss is a challenge.  She is feeling "almost back to normal". CherylKadencenues to do well in rehab.  Her blood pressures have continued to do well despite having come off of her medications.   She is still on her Crestor twice a week, which seems to be working for her.  Her weight continues to come down as well.  She is using [employer] online.  She is down 13 lbs since her admission and is determined to keep it off this time.     Expected Outcomes Short: Continue to stay on top of her heart failure symptoms.  Long: Continue to work on risk factor modifications. Short - CherylZoannecontinue to attend class regularly.  Long - CherylBaylyncomplete HT and continue healthy habits.  Short: Continue to follow Weight [employer].  Long: Continue to build these healthy habits into her everyday life.         Exercise Goals and Review:     Exercise Goals    Row Name 10/20/16 1501             Exercise Goals   Increase Physical Activity Yes       Intervention Provide advice, education, support and counseling about physical activity/exercise needs.;Develop an individualized exercise prescription for aerobic and resistive training based on initial evaluation  findings, risk stratification, comorbidities and participant's personal goals.       Expected Outcomes Achievement of increased cardiorespiratory fitness and enhanced flexibility, muscular endurance and strength shown through measurements of functional capacity and personal statement of participant.        Increase Strength and Stamina Yes       Intervention Provide advice, education, support and counseling about physical activity/exercise needs.;Develop an individualized exercise prescription for aerobic and resistive training based on initial evaluation findings, risk stratification, comorbidities and participant's personal goals.       Expected Outcomes Achievement of increased cardiorespiratory fitness and enhanced flexibility, muscular endurance and strength shown through measurements of functional capacity and personal statement of participant.          Nutrition & Weight - Outcomes:     Pre Biometrics - 10/20/16 1502      Pre Biometrics   Height 5' 5.8" (1.671 m)   Weight 211 lb 6.4 oz (95.9 kg)   Waist Circumference 40 inches   Hip Circumference 51 inches   Waist to Hip Ratio 0.78 %   BMI (Calculated) 34.4   Single Leg Stand 0.58 seconds         Post Biometrics - 12/30/16 0939       Post  Biometrics   Height 5' 5.8" (1.671 m)   Weight 208 lb (94.3 kg)   Waist Circumference 38 inches   Hip Circumference 49.5 inches   Waist to Hip Ratio 0.77 %   BMI (Calculated) 33.79   Single Leg Stand 1.55 seconds      Nutrition:     Nutrition Therapy & Goals - 11/11/16 1223      Nutrition Therapy   Diet Instructed on a meal plan based on 1400 calories and heart healthy dietary guidelines   Protein (specify units) 6   Fiber 20 grams   Whole Grain Foods 3 servings   Saturated Fats 10 max. grams   Fruits and Vegetables 5 servings/day   Sodium 1500 grams     Personal Nutrition Goals   Nutrition Goal Read labels for saturated fat, trans fat and sodium.   Personal Goal #2 Look at sodium content of cottage cheese.   Personal Goal #3 Balance meals with 1-3 oz protein, 2-3 servings of carbohydrate and non-starchy vegetables.     Intervention Plan   Intervention Prescribe, educate and counsel regarding individualized specific dietary modifications aiming towards targeted core  components such as weight, hypertension, lipid management, diabetes, heart failure and other comorbidities.;Nutrition handout(s) given to patient.   Expected Outcomes Short Term Goal: Understand basic principles of dietary content, such as calories, fat, sodium, cholesterol and nutrients.;Short Term Goal: A plan has been developed with personal nutrition goals set during dietitian appointment.;Long Term Goal: Adherence to prescribed nutrition plan.      Nutrition Discharge:     Nutrition Assessments - 12/25/16 0851      MEDFICTS Scores   Pre Score 38   Post Score 34   Score Difference -4      Education Questionnaire Score:     Knowledge Questionnaire Score - 12/25/16 0851      Knowledge Questionnaire Score   Pre Score 24/28   Post Score 28/28      Goals reviewed with patient; copy given to patient.

## 2017-01-20 NOTE — Progress Notes (Signed)
Cardiac Individual Treatment Plan  Patient Details  Name: Darlene Lawrence MRN: [id number] Date of Birth: 1941-02-27 Referring Provider:     Cardiac Rehab from 10/20/2016 in [facility] Cardiac and Pulmonary Rehab  Referring Provider  Bartholome Bill MD      Initial Encounter Date:    Cardiac Rehab from 10/20/2016 in [facility] Cardiac and Pulmonary Rehab  Date  10/20/16  Referring Provider  Bartholome Bill MD      Visit Diagnosis: NSTEMI (non-ST elevated myocardial infarction) [facility])  Patient's Home Medications on Admission:  Current Outpatient Prescriptions:  .  Aspirin (ASPIR-81 PO), Take 1 tablet by mouth., Disp: , Rfl:  .  b complex vitamins tablet, Take 1 tablet by mouth daily., Disp: , Rfl:  .  Calcium Citrate-Vitamin D (CALCIUM CITRATE + PO), Take 1 tablet by mouth daily., Disp: , Rfl:  .  Cholecalciferol (VITAMIN D3) 2000 UNITS TABS, Take 1 capsule by mouth 2 (two) times daily., Disp: , Rfl:  .  Coenzyme Q10 (COQ10) 100 MG CAPS, Take 1 capsule by mouth daily., Disp: , Rfl:  .  furosemide (LASIX) 20 MG tablet, Take 1 tablet (20 mg total) by mouth daily., Disp: 30 tablet, Rfl: 0 .  metoprolol tartrate (LOPRESSOR) 25 MG tablet, Take 1 tablet (25 mg total) by mouth 2 (two) times daily. (Patient not taking: Reported on 12/08/2016), Disp: 60 tablet, Rfl: 0 .  Misc Natural Products (GLUCOSAMINE CHOND MSM FORMULA PO), Take 1,500 mg by mouth., Disp: , Rfl:  .  Multiple Vitamin (MULTI VITAMIN DAILY PO), Take 1 tablet by mouth., Disp: , Rfl:  .  Omega-3 Fatty Acids (FISH OIL) 1200 MG CAPS, Take 1 capsule by mouth., Disp: , Rfl:  .  rosuvastatin (CRESTOR) 10 MG tablet, Take 10 mg by mouth 2 (two) times a week. Monday, Thursday., Disp: , Rfl:   Past Medical History: Past Medical History:  Diagnosis Date  . Abnormal Pap smear   . Arthritis   . breast cancer 2009   right  . Cancer ([place])   . CHF (congestive heart failure) ([place])   . DDD (degenerative disc disease), lumbar   . High cholesterol   .  Hyperlipidemia   . Hypertension   . Meniscus tear    right  . Personal history of radiation therapy   . RBBB     Tobacco Use: History  Smoking Status  . Never Smoker  Smokeless Tobacco  . Never Used    Labs: Recent Review Flowsheet Data    Labs for ITP Cardiac and Pulmonary Rehab Latest Ref Rng & Units 06/27/2014 10/11/2015 09/13/2016 09/13/2016 09/13/2016   Cholestrol 0 - 200 mg/dL 169 150 114 - -   LDLCALC 0 - 99 mg/dL 94 80 62 - -   HDL >40 mg/dL 47.60 43.50 26(L) - -   Trlycerides <150 mg/dL 136.0 133.0 129 - -   PHART 7.350 - 7.450 - - - 7.48(H) 7.53(H)   PCO2ART 32.0 - 48.0 mmHg - - - 34 31(L)   HCO3 20.0 - 28.0 mmol/L - - - 25.3 25.9   O2SAT % - - - 87.9 98.2       Exercise Target Goals:    Exercise Program Goal: Individual exercise prescription set with THRR, safety & activity barriers. Participant demonstrates ability to understand and report RPE using BORG scale, to self-measure pulse accurately, and to acknowledge the importance of the exercise prescription.  Exercise Prescription Goal: Starting with aerobic activity 30 plus minutes a day, 3 days per week  for initial exercise prescription. Provide home exercise prescription and guidelines that participant acknowledges understanding prior to discharge.  Activity Barriers & Risk Stratification:     Activity Barriers & Cardiac Risk Stratification - 10/20/16 1415      Activity Barriers & Cardiac Risk Stratification   Activity Barriers Left Hip Replacement;Right Knee Replacement;Back Problems;Deconditioning;Muscular Weakness;Shortness of Breath;Other (comment);Balance Concerns   Comments menicus tear, back pain after doing some housework   Cardiac Risk Stratification High      6 Minute Walk:     6 Minute Walk    Row Name 10/20/16 1454 12/30/16 0938       6 Minute Walk   Phase Initial Discharge    Distance 1060 feet 1332 feet    Distance % Change  - 25.7 %  272 ft    Walk Time 6 minutes 6 minutes    # of  Rest Breaks 0 0    MPH 2 2.52    METS 1.79 2.59    RPE 13 13    Perceived Dyspnea  1  -    VO2 Peak 6.28 9.07    Symptoms Yes (comment) No    Comments slightly SOB, no pain  -    Resting HR 44 bpm 64 bpm    Resting BP 126/64 124/72    Max Ex. HR 93 bpm 119 bpm    Max Ex. BP 132/66 136/68    2 Minute Post BP 126/70  -       Oxygen Initial Assessment:   Oxygen Re-Evaluation:   Oxygen Discharge (Final Oxygen Re-Evaluation):   Initial Exercise Prescription:     Initial Exercise Prescription - 10/20/16 1400      Date of Initial Exercise RX and Referring Provider   Date 10/20/16   Referring Provider Bartholome Bill MD     Treadmill   MPH 1   Grade 0.5   Minutes 15   METs 1.83     NuStep   Level 1   SPM 80   Minutes 15   METs 1.7     REL-XR   Level 1   Speed 50   Minutes 15   METs 1.7     Prescription Details   Frequency (times per week) 2   Duration Progress to 45 minutes of aerobic exercise without signs/symptoms of physical distress     Intensity   THRR 40-80% of Max Heartrate 85-126   Ratings of Perceived Exertion 11-13   Perceived Dyspnea 0-4     Progression   Progression Continue to progress workloads to maintain intensity without signs/symptoms of physical distress.     Resistance Training   Training Prescription Yes   Weight 2 lbs   Reps 10-15      Perform Capillary Blood Glucose checks as needed.  Exercise Prescription Changes:     Exercise Prescription Changes    Row Name 10/20/16 1400 11/04/16 1000 11/19/16 1400 12/03/16 1400 12/18/16 1500     Response to Exercise   Blood Pressure (Admit) 126/64 134/70 112/64 126/72 120/62   Blood Pressure (Exercise) 132/66 152/78 138/74 136/66 140/74   Blood Pressure (Exit) 126/70 122/60 120/70 124/64 132/70   Heart Rate (Admit) 44 bpm 77 bpm 53 bpm 59 bpm 62 bpm   Heart Rate (Exercise) 93 bpm 83 bpm 95 bpm 94 bpm 95 bpm   Heart Rate (Exit) 53 bpm 50 bpm 49 bpm 48 bpm 56 bpm   Oxygen Saturation  (Admit) 99 %  -  -  -  -  Oxygen Saturation (Exercise) 94 %  -  -  -  -   Rating of Perceived Exertion (Exercise) _0 Perceived Dyspnea (Exercise) 1  -  -  -  -   Symptoms Slightly SOB, no pain none none none none   Comments walk test results  -  -  -  -   Duration  - Continue with 45 min of aerobic exercise without signs/symptoms of physical distress. Continue with 45 min of aerobic exercise without signs/symptoms of physical distress. Continue with 45 min of aerobic exercise without signs/symptoms of physical distress. Continue with 45 min of aerobic exercise without signs/symptoms of physical distress.   Intensity  - THRR unchanged THRR unchanged THRR unchanged THRR unchanged     Progression   Progression  - Continue to progress workloads to maintain intensity without signs/symptoms of physical distress. Continue to progress workloads to maintain intensity without signs/symptoms of physical distress. Continue to progress workloads to maintain intensity without signs/symptoms of physical distress. Continue to progress workloads to maintain intensity without signs/symptoms of physical distress.   Average METs  - 2.13 2.4 2.7 2.7     Resistance Training   Training Prescription  - Yes Yes Yes Yes   Weight  - 2 lbs 3 lb 3 lb 3 lb   Reps  - 10-15 10-15 10-15 10-15     Interval Training   Interval Training  - No No No No     Treadmill   MPH  - 1.2 1.2 1.2 1.2   Grade  - 0.5 0.5 0.5 0.5   Minutes  - _1 METs  - _2 NuStep   Level  - 3  - 4 5   Minutes  - 15  - 15 15   METs  - 2  - 2.7 2.8     REL-XR   Level  - _3 Minutes  - _4 METs  - 2.4 2.8 3.5 3.3     Home Exercise Plan   Plans to continue exercise at  [facility] (comment)  walking, fitness center in complex, [place] (comment)  walking, fitness center in complex, [place] (comment)  walking, fitness center in complex, [facility]    Frequency  - Add 2 additional days to program exercise sessions.  - Add 2 additional days to program exercise sessions. Add 2 additional days to program exercise sessions.   Initial Home Exercises Provided  - 11/04/16  - 11/04/16 11/04/16   Row Name 12/31/16 1500 01/13/17 1600           Response to Exercise   Blood Pressure (Admit) 124/72 124/74      Blood Pressure (Exercise) 136/68 146/74      Blood Pressure (Exit) 122/72 128/70      Heart Rate (Admit) 64 bpm 67 bpm      Heart Rate (Exercise) 119 bpm 94 bpm      Heart Rate (Exit) 78 bpm 58 bpm      Rating of Perceived Exertion (Exercise) 13 13      Symptoms none none      Duration Continue with 45 min of aerobic exercise without signs/symptoms of physical distress. Continue with 45 min of aerobic exercise without signs/symptoms of physical distress.      Intensity THRR unchanged THRR unchanged  Progression   Progression Continue to progress workloads to maintain intensity without signs/symptoms of physical distress. Continue to progress workloads to maintain intensity without signs/symptoms of physical distress.      Average METs 3.1 2.76        Resistance Training   Training Prescription Yes Yes      Weight 3 lb 3 lb      Reps 10-15 10-15        Interval Training   Interval Training No No        Treadmill   MPH 2.2 2.2      Grade 1 1      Minutes 15 15      METs 2.99 2.99        NuStep   Level 5 5      Minutes 15 15      METs 2.9 2.9        REL-XR   Level 4 4      Minutes 15 15      METs 3.4 2.4        Home Exercise Plan   Plans to continue exercise at [employer] [employer] (comment)  walking, fitness center in complex, [place] (comment)  walking, fitness center in complex, [facility]      Frequency Add 2 additional days to program exercise sessions. Add 2 additional days to program exercise sessions.      Initial Home Exercises Provided 11/04/16 11/04/16         Exercise Comments:      Exercise Comments    Row Name 10/28/16 1027 01/20/17 0959         Exercise Comments First full day of exercise!  Patient was oriented to gym and equipment including functions, settings, policies, and procedures.  Patient's individual exercise prescription and treatment plan were reviewed.  All starting workloads were established based on the results of the 6 minute walk test done at initial orientation visit.  The plan for exercise progression was also introduced and progression will be customized based on patient's performance and goals. Kismet graduated today from cardiac rehab with 36/36 sessions completed.  Details of the patient's exercise prescription and what She needs to do in order to continue the prescription and progress were discussed with patient.  Patient was given a copy of prescription and goals.  Patient verbalized understanding.  Baylynn plans to continue to exercise by going to [employer].         Exercise Goals and Review:     Exercise Goals    Row Name 10/20/16 1501             Exercise Goals   Increase Physical Activity Yes       Intervention Provide advice, education, support and counseling about physical activity/exercise needs.;Develop an individualized exercise prescription for aerobic and resistive training based on initial evaluation findings, risk stratification, comorbidities and participant's personal goals.       Expected Outcomes Achievement of increased cardiorespiratory fitness and enhanced flexibility, muscular endurance and strength shown through measurements of functional capacity and personal statement of participant.       Increase Strength and Stamina Yes       Intervention Provide advice, education, support and counseling about physical activity/exercise needs.;Develop an individualized exercise prescription for aerobic and resistive training based on initial evaluation findings, risk stratification, comorbidities and participant's personal goals.        Expected Outcomes Achievement of increased cardiorespiratory fitness and enhanced flexibility, muscular endurance and strength  shown through measurements of functional capacity and personal statement of participant.          Exercise Goals Re-Evaluation :     Exercise Goals Re-Evaluation    Row Name 11/04/16 1019 11/04/16 1342 11/06/16 1026 11/19/16 1500 12/03/16 1426     Exercise Goal Re-Evaluation   Exercise Goals Review Increase Physical Activity;Increase Strenth and Stamina Increase Physical Activity;Increase Strenth and Stamina Increase Physical Activity;Increase Strenth and Stamina Increase Physical Activity;Increase Strenth and Stamina Increase Physical Activity;Increase Strenth and Stamina   Comments Reviewed home exercise with pt today.  Pt plans to walk and go to [employer] for exercise. She also has access to a fitness center within her complex.   Reviewed THR, pulse, RPE, sign and symptoms, and when to call 911 or MD.  Also discussed weather considerations and indoor options.  Pt voiced understanding. Emoni is off to a good start in rehab.  She is up to 2.4 METs on the XR and level 4 on the NuStep!  We will continue to monitor her progression. Cherly returned to [employer] yesterday!  She is starting to feel stronger and able to do more around the house now! Deisy will continue to attend [facility] and add exercise at home.  She has increased her MET level overall. Carollee has been doing well in rehab.  She has been doing well and walked while out on vacation.  She is now up to 3.5 METs on the XR.  We will continue to monitor her progression.   Expected Outcomes Short: Add in at least one extra day at home.  Long: Get back to going to [employer] regularly. Short: Add in at least one day at home and begin to increase workloads.  Long: Back to regular exercise. Short: Continue to go to [employer] and exercise on off days.  Long: Continue to increase strength and stamina. Short - Shamaine will add home exercise  to her program.  Long - Laraine will complete HT and exercise independently. Short: Teigan will start to increase her workload on the XR.  Long: Continue to exercise independently   [place] Name 12/11/16 0858 12/18/16 1527 12/30/16 0939 12/31/16 1522 01/06/17 1026     Exercise Goal Re-Evaluation   Exercise Goals Review Increase Physical Activity;Increase Strenth and Stamina Increase Physical Activity;Increase Strenth and Stamina Increase Physical Activity;Increase Strenth and Stamina Increase Physical Activity;Increase Strenth and Stamina Increase Physical Activity;Increase Strength and Stamina;Understanding of Exercise Prescription   Comments Avira attends [employer] twice per week outside HT.   Jacqui has been doing well in rehab.  She is up to level 5 on the NuStep.  She is going to [employer] regularly.  We will continue to monitor for progression.  Improved post 6MWT by 272 ft!! [place] has been doing well in rehab.  She is nearing graduation and already going to the gym regularly.  She is now up to 2.2 mph on the treadmill.  We will continue to monitor her progression.  Jerrell continues do well.  She is getting stronger and has more stamina to do more around the house.  She feels as though she is back to where she was before all of this happened.  She continues to attend [employer] on her off day from rehab.      Expected Outcomes Short - Zanyla will continue regular exercise and complete HT.  Long - Saquoia will maintain long term regular exercise. Short: Continue to attend classes and [facility] regularly.  Long: Continue to work on IT sales professional.   -  Short: Graduate!  Long: Continue to exercise at [employer] and by walking.  Short: Graduate from rehab.  Long; Continue to exercise independently to maintain what she has gained in the program.     [place] Name 01/13/17 1551             Exercise Goal Re-Evaluation   Exercise Goals Review Increase Physical Activity;Increase Strength and Stamina       Comments Alaiza has  been doing well in rehab.  She is getting ready to graduate and has been going to [employer] consisitently. We will continue to monitor her progression.       Expected Outcomes Short: Graduate from rehab.  Long; Continue to exercise independently to maintain what she has gained in the program.            Discharge Exercise Prescription (Final Exercise Prescription Changes):     Exercise Prescription Changes - 01/13/17 1600      Response to Exercise   Blood Pressure (Admit) 124/74   Blood Pressure (Exercise) 146/74   Blood Pressure (Exit) 128/70   Heart Rate (Admit) 67 bpm   Heart Rate (Exercise) 94 bpm   Heart Rate (Exit) 58 bpm   Rating of Perceived Exertion (Exercise) 13   Symptoms none   Duration Continue with 45 min of aerobic exercise without signs/symptoms of physical distress.   Intensity THRR unchanged     Progression   Progression Continue to progress workloads to maintain intensity without signs/symptoms of physical distress.   Average METs 2.76     Resistance Training   Training Prescription Yes   Weight 3 lb   Reps 10-15     Interval Training   Interval Training No     Treadmill   MPH 2.2   Grade 1   Minutes 15   METs 2.99     NuStep   Level 5   Minutes 15   METs 2.9     REL-XR   Level 4   Minutes 15   METs 2.4     Home Exercise Plan   Plans to continue exercise at [facility] (comment)  walking, fitness center in complex, [facility]   Frequency Add 2 additional days to program exercise sessions.   Initial Home Exercises Provided 11/04/16      Nutrition:  Target Goals: Understanding of nutrition guidelines, daily intake of sodium <1573m, cholesterol <2035m calories 30% from fat and 7% or less from saturated fats, daily to have 5 or more servings of fruits and vegetables.  Biometrics:     Pre Biometrics - 10/20/16 1502      Pre Biometrics   Height 5' 5.8" (1.671 m)   Weight 211 lb 6.4 oz (95.9 kg)   Waist Circumference 40 inches   Hip  Circumference 51 inches   Waist to Hip Ratio 0.78 %   BMI (Calculated) 34.4   Single Leg Stand 0.58 seconds         Post Biometrics - 12/30/16 0939       Post  Biometrics   Height 5' 5.8" (1.671 m)   Weight 208 lb (94.3 kg)   Waist Circumference 38 inches   Hip Circumference 49.5 inches   Waist to Hip Ratio 0.77 %   BMI (Calculated) 33.79   Single Leg Stand 1.55 seconds      Nutrition Therapy Plan and Nutrition Goals:     Nutrition Therapy & Goals - 11/11/16 1223      Nutrition Therapy   Diet Instructed on  a meal plan based on 1400 calories and heart healthy dietary guidelines   Protein (specify units) 6   Fiber 20 grams   Whole Grain Foods 3 servings   Saturated Fats 10 max. grams   Fruits and Vegetables 5 servings/day   Sodium 1500 grams     Personal Nutrition Goals   Nutrition Goal Read labels for saturated fat, trans fat and sodium.   Personal Goal #2 Look at sodium content of cottage cheese.   Personal Goal #3 Balance meals with 1-3 oz protein, 2-3 servings of carbohydrate and non-starchy vegetables.     Intervention Plan   Intervention Prescribe, educate and counsel regarding individualized specific dietary modifications aiming towards targeted core components such as weight, hypertension, lipid management, diabetes, heart failure and other comorbidities.;Nutrition handout(s) given to patient.   Expected Outcomes Short Term Goal: Understand basic principles of dietary content, such as calories, fat, sodium, cholesterol and nutrients.;Short Term Goal: A plan has been developed with personal nutrition goals set during dietitian appointment.;Long Term Goal: Adherence to prescribed nutrition plan.      Nutrition Discharge: Rate Your Plate Scores:     Nutrition Assessments - 12/25/16 0851      MEDFICTS Scores   Pre Score 38   Post Score 34   Score Difference -4      Nutrition Goals Re-Evaluation:     Nutrition Goals Re-Evaluation    Row Name 11/06/16  1038 01/06/17 1041           Goals   Current Weight 211 lb (95.7 kg)  -      Nutrition Goal  - Read labels for saturated fat, trans fat and sodium.  Balance meals with protein, cards, and veggies      Comment Scheduled appointment to meet with dietician on July 3rd, next week. Tashunda has been following Weight Watchers online.  She is also reading food labels and eating balanced meals with more protein. She is pleased with how the combination of diet and exercise are working for her weight loss.         Expected Outcome  - Short: Continue to follow Weight Watchers.  Long: Maintain heart healthy diet.          Nutrition Goals Discharge (Final Nutrition Goals Re-Evaluation):     Nutrition Goals Re-Evaluation - 01/06/17 1041      Goals   Nutrition Goal Read labels for saturated fat, trans fat and sodium.  Balance meals with protein, cards, and veggies   Comment Ellese has been following Weight Watchers online.  She is also reading food labels and eating balanced meals with more protein. She is pleased with how the combination of diet and exercise are working for her weight loss.      Expected Outcome Short: Continue to follow Weight Watchers.  Long: Maintain heart healthy diet.       Psychosocial: Target Goals: Acknowledge presence or absence of significant depression and/or stress, maximize coping skills, provide positive support system. Participant is able to verbalize types and ability to use techniques and skills needed for reducing stress and depression.   Initial Review & Psychosocial Screening:     Initial Psych Review & Screening - 10/20/16 1424      Initial Review   Current issues with Current Sleep Concerns;Current Stress Concerns  Since hospital: is improving some.  Continues to require nap daily which is not her norm   Source of Stress Concerns Unable to perform yard/household activities   Comments Still not  feeling strong enough to do chores.  Length of stay in hospital  was debilitating.  Had home health after discharge and is doing better though not back to normal.     [place]? Yes  Husband and her 32 year old disabled son.     Barriers   Psychosocial barriers to participate in program There are no identifiable barriers or psychosocial needs.;The patient should benefit from training in stress management and relaxation.     Screening Interventions   Interventions Encouraged to exercise;To provide support and resources with identified psychosocial needs;Provide feedback about the scores to participant      Quality of Life Scores:      Quality of Life - 12/25/16 0851      Quality of Life Scores   Health/Function Pre 20.8 %   Health/Function Post 24.6 %   Health/Function % Change 18.27 %   Socioeconomic Pre 28.8 %   Socioeconomic Post 25 %   Socioeconomic % Change  -13.19 %   Psych/Spiritual Pre 22.29 %   Psych/Spiritual Post 23.14 %   Psych/Spiritual % Change 3.81 %   Family Pre 28.8 %   Family Post 24 %   Family % Change -16.67 %   GLOBAL Pre 23.63 %   GLOBAL Post 24.27 %   GLOBAL % Change 2.71 %      PHQ-9: Recent Review Flowsheet Data    Depression screen [facility] 2/9 12/25/2016 11/06/2016 11/03/2016 10/20/2016 10/16/2016   Decreased Interest 0 0 0 0 0   Down, Depressed, Hopeless 0 0 0 0 0   PHQ - 2 Score 0 0 0 0 0   Altered sleeping 0 - - 1 -   Tired, decreased energy 2 - - 1  -   Change in appetite 0 - - 0 -   Feeling bad or failure about yourself  0 - - 0 -   Trouble concentrating 0 - - 0 -   Moving slowly or fidgety/restless 0 - - 0 -   Suicidal thoughts 0 - - 0 -   PHQ-9 Score 2 - - 2 -   Difficult doing work/chores Somewhat difficult - - Somewhat difficult -     Interpretation of Total Score  Total Score Depression Severity:  1-4 = Minimal depression, 5-9 = Mild depression, 10-14 = Moderate depression, 15-19 = Moderately severe depression, 20-27 = Severe depression   Psychosocial Evaluation and  Intervention:     Psychosocial Evaluation - 11/04/16 1005      Psychosocial Evaluation & Interventions   Interventions Encouraged to exercise with the program and follow exercise prescription;Stress management education   Comments Counselor met with Klea today for initial psychosocial evaluation.  She is a 77 year old who came into this program due to pneumonia and a heart attack more recently.  She has a strong support system with a spouse of 70 years who is in good health. She also has a son who lives locally and another who lives in [place].  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  She reports sleeping well and having a good appetite.  She also denies a history or current symptoms of depression or anxiety and states she is typically in a positive mood.  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  She has goals to increase her energy and stamina while in this program.  Staff will follow with Malachy Mood throughout the course of this program.     Expected Outcomes Marilou will benefit from consistent exercise to achieve her stated goals.  She will also benefit from the psychoeducational components of this program - particularly stress management.  Staff will be following with Malachy Mood.      Psychosocial Re-Evaluation:     Psychosocial Re-Evaluation    [place] Name 01/06/17 1135             Psychosocial Re-Evaluation   Current issues with Current Stress Concerns       Comments Kischa is doing well mentatlly.  She feels stronger than she has before; she is back to where she was before all this happened and able to do more around the house again.  She continues to sleep well and is still involved in her church for support.  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  She is coping well with these and maintains a positive attitude!        Expected Outcomes Short: Continue to be postive.  Long: Continue to do more around house and staying active in the community.       Interventions Encouraged to attend Cardiac Rehabilitation for the exercise       Continue Psychosocial Services  Follow up required by staff       Comments Stressors continue to be HOA and healthy issues.          Initial Review   Source of Stress Concerns Occupation;Chronic Illness          Psychosocial Discharge (Final Psychosocial Re-Evaluation):     Psychosocial Re-Evaluation - 01/06/17 1135      Psychosocial Re-Evaluation   Current issues with Current Stress Concerns   Comments Brailey is doing well mentatlly.  She feels stronger than she has before; she is back to where she was before all this happened and able to do more around the house again.  She continues to sleep well and is still involved in her church for support.  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  She is coping well with these and maintains a positive attitude!   Expected Outcomes Short: Continue to be postive.  Long: Continue to do more around house and staying active in the community.   Interventions Encouraged to attend Cardiac Rehabilitation for the exercise   Continue Psychosocial Services  Follow up required by staff   Comments Stressors continue to be HOA and healthy issues.      Initial Review   Source of Stress Concerns Occupation;Chronic Illness      Vocational Rehabilitation: Provide vocational rehab assistance to qualifying candidates.   Vocational Rehab Evaluation & Intervention:     Vocational Rehab - 10/20/16 1434      Initial Vocational Rehab Evaluation & Intervention   Assessment shows need for Vocational Rehabilitation No      Education: Education Goals: Education classes will be provided on a variety of topics geared toward better understanding of heart health and risk factor modification. Participant will state  understanding/return demonstration of topics presented as noted by education test scores.  Learning Barriers/Preferences:     Learning Barriers/Preferences - 10/20/16 1433      Learning Barriers/Preferences   Learning Barriers None   Learning Preferences Individual Instruction;Skilled Demonstration;Group Instruction      Education Topics: General Nutrition Guidelines/Fats and Fiber: -Group instruction provided by verbal, written material, models  and posters to present the general guidelines for heart healthy nutrition. Gives an explanation and review of dietary fats and fiber.   Cardiac Rehab from 01/20/2017 in [facility] Cardiac and Pulmonary Rehab  Date  12/30/16  Educator  CR  Instruction Review Code  1- Verbalizes Understanding      Controlling Sodium/Reading Food Labels: -Group verbal and written material supporting the discussion of sodium use in heart healthy nutrition. Review and explanation with models, verbal and written materials for utilization of the food label.   Cardiac Rehab from 01/20/2017 in [facility] Cardiac and Pulmonary Rehab  Date  11/11/16  Educator  PI  Instruction Review Code  1- Verbalizes Understanding      Exercise Physiology & Risk Factors: - Group verbal and written instruction with models to review the exercise physiology of the cardiovascular system and associated critical values. Details cardiovascular disease risk factors and the goals associated with each risk factor.   Cardiac Rehab from 01/20/2017 in [facility] Cardiac and Pulmonary Rehab  Date  01/15/17  Educator  [facility]  Instruction Review Code  1- Verbalizes Understanding      Aerobic Exercise & Resistance Training: - Gives group verbal and written discussion on the health impact of inactivity. On the components of aerobic and resistive training programs and the benefits of this training and how to safely progress through these programs.   Cardiac Rehab from 01/20/2017 in [facility] Cardiac and Pulmonary Rehab   Date  01/20/17  Educator  [facility]  Instruction Review Code  1- Verbalizes Understanding      Flexibility, Balance, General Exercise Guidelines: - Provides group verbal and written instruction on the benefits of flexibility and balance training programs. Provides general exercise guidelines with specific guidelines to those with heart or lung disease. Demonstration and skill practice provided.   Stress Management: - Provides group verbal and written instruction about the health risks of elevated stress, cause of high stress, and healthy ways to reduce stress.   Cardiac Rehab from 01/20/2017 in [facility] Cardiac and Pulmonary Rehab  Date  12/02/16  Educator  [place]      Depression: - Provides group verbal and written instruction on the correlation between heart/lung disease and depressed mood, treatment options, and the stigmas associated with seeking treatment.   Cardiac Rehab from 01/20/2017 in [facility] Cardiac and Pulmonary Rehab  Date  12/25/16  Educator  [facility]  Instruction Review Code (retired)  2- meets Designer, fashion/clothing & Physiology of the Heart: - Group verbal and written instruction and models provide basic cardiac anatomy and physiology, with the coronary electrical and arterial systems. Review of: AMI, Angina, Valve disease, Heart Failure, Cardiac Arrhythmia, Pacemakers, and the ICD.   Cardiac Rehab from 01/20/2017 in [facility] Cardiac and Pulmonary Rehab  Date  12/04/16  Educator  [facility]      Cardiac Procedures: - Group verbal and written instruction to review commonly prescribed medications for heart disease. Reviews the medication, class of the drug, and side effects. Includes the steps to properly store meds and maintain the prescription regimen. (beta blockers and nitrates)   Cardiac Rehab from 01/20/2017 in [facility] Cardiac and Pulmonary Rehab  Date  12/09/16  Educator  CE      Cardiac Medications I: - Group verbal and written instruction to review commonly prescribed medications for  heart disease. Reviews the medication, class of the drug, and side effects. Includes the steps to properly store meds and maintain the prescription regimen.   Cardiac Rehab from 01/20/2017  in [facility] Cardiac and Pulmonary Rehab  Date  12/16/16 [8/7 Part One 8/9 Part Two]  Educator  SB      Cardiac Medications II: -Group verbal and written instruction to review commonly prescribed medications for heart disease. Reviews the medication, class of the drug, and side effects. (all other drug classes)    Go Sex-Intimacy & Heart Disease, Get SMART - Goal Setting: - Group verbal and written instruction through game format to discuss heart disease and the return to sexual intimacy. Provides group verbal and written material to discuss and apply goal setting through the application of the S.M.A.R.T. Method.   Cardiac Rehab from 01/20/2017 in [facility] Cardiac and Pulmonary Rehab  Date  12/09/16  Educator  CE      Other Matters of the Heart: - Provides group verbal, written materials and models to describe Heart Failure, Angina, Valve Disease, Peripheral Artery Disease, and Diabetes in the realm of heart disease. Includes description of the disease process and treatment options available to the cardiac patient.   Cardiac Rehab from 01/20/2017 in [facility] Cardiac and Pulmonary Rehab  Date  12/04/16  Educator  [facility]      Exercise & Equipment Safety: - Individual verbal instruction and demonstration of equipment use and safety with use of the equipment.   Cardiac Rehab from 01/20/2017 in [facility] Cardiac and Pulmonary Rehab  Date  10/20/16  Educator  SB      Infection Prevention: - Provides verbal and written material to individual with discussion of infection control including proper hand washing and proper equipment cleaning during exercise session.   Cardiac Rehab from 01/20/2017 in [facility] Cardiac and Pulmonary Rehab  Date  10/20/16  Educator  Sb      Falls Prevention: - Provides verbal and written material  to individual with discussion of falls prevention and safety.   Cardiac Rehab from 01/20/2017 in [facility] Cardiac and Pulmonary Rehab  Date  10/20/16  Educator  Sb  Instruction Review Code (retired)  2- meets goals/outcomes      Diabetes: - Individual verbal and written instruction to review signs/symptoms of diabetes, desired ranges of glucose level fasting, after meals and with exercise. Acknowledge that pre and post exercise glucose checks will be done for 3 sessions at entry of program.   Other: -Provides group and verbal instruction on various topics (see comments)    Knowledge Questionnaire Score:     Knowledge Questionnaire Score - 12/25/16 0851      Knowledge Questionnaire Score   Pre Score 24/28   Post Score 28/28      Core Components/Risk Factors/Patient Goals at Admission:     Personal Goals and Risk Factors at Admission - 10/20/16 1423      Core Components/Risk Factors/Patient Goals on Admission    Weight Management Obesity;Yes;Weight Loss   Intervention Weight Management: Develop a combined nutrition and exercise program designed to reach desired caloric intake, while maintaining appropriate intake of nutrient and fiber, sodium and fats, and appropriate energy expenditure required for the weight goal.;Weight Management: Provide education and appropriate resources to help participant work on and attain dietary goals.;Weight Management/Obesity: Establish reasonable short term and long term weight goals.;Obesity: Provide education and appropriate resources to help participant work on and attain dietary goals.   Admit Weight 208 lb (94.3 kg)   Goal Weight: Short Term 205 lb (93 kg)   Goal Weight: Long Term 175 lb (79.4 kg)   Expected Outcomes Short Term: Continue to assess and modify interventions until short term  weight is achieved;Long Term: Adherence to nutrition and physical activity/exercise program aimed toward attainment of established weight goal;Weight Loss:  Understanding of general recommendations for a balanced deficit meal plan, which promotes 1-2 lb weight loss per week and includes a negative energy balance of [phone] kcal/d;Understanding recommendations for meals to include 15-35% energy as protein, 25-35% energy from fat, 35-60% energy from carbohydrates, less than 275m of dietary cholesterol, 20-35 gm of total fiber daily;Understanding of distribution of calorie intake throughout the day with the consumption of 4-5 meals/snacks   Hypertension Yes   Intervention Provide education on lifestyle modifcations including regular physical activity/exercise, weight management, moderate sodium restriction and increased consumption of fresh fruit, vegetables, and low fat dairy, alcohol moderation, and smoking cessation.;Monitor prescription use compliance.   Expected Outcomes Short Term: Continued assessment and intervention until BP is < 140/957mHG in hypertensive participants. < 130/807mG in hypertensive participants with diabetes, heart failure or chronic kidney disease.;Long Term: Maintenance of blood pressure at goal levels.   Lipids Yes   Intervention Provide education and support for participant on nutrition & aerobic/resistive exercise along with prescribed medications to achieve LDL <27m63mDL >40mg31mExpected Outcomes Short Term: Participant states understanding of desired cholesterol values and is compliant with medications prescribed. Participant is following exercise prescription and nutrition guidelines.;Long Term: Cholesterol controlled with medications as prescribed, with individualized exercise RX and with personalized nutrition plan. Value goals: LDL < 27mg,92m > 40 mg.      Core Components/Risk Factors/Patient Goals Review:      Goals and Risk Factor Review    Row Name 11/06/16 1034 12/11/16 0853 01/06/17 1036         Core Components/Risk Factors/Patient Goals Review   Personal Goals Review Weight  Management/Obesity;Hypertension;Lipids;Heart Failure Weight Management/Obesity;Hypertension Weight Management/Obesity;Hypertension;Lipids     Review Parthenia's weight has been holding steady and she weighs herself daily at home.  She continues to have some SOB but it is improving as she is able to do more at home.  [facility] and she has not had any problems with taking any of her medications.   CherylAngeleaad BP readings within normal range during class.  She is off her BP med due to hair loss.  She has met with the RD and feels she knows what to eat and eats well but weight loss is a challenge.  She is feeling "almost back to normal". CherylToniettenues to do well in rehab.  Her blood pressures have continued to do well despite having come off of her medications.   She is still on her Crestor twice a week, which seems to be working for her.  Her weight continues to come down as well.  She is using Weight [employer] online.  She is down 13 lbs since her admission and is determined to keep it off this time.     Expected Outcomes Short: Continue to stay on top of her heart failure symptoms.  Long: Continue to work on risk factor modifications. Short - CherylSherondacontinue to attend class regularly.  Long - CherylTanairicomplete HT and continue healthy habits.  Short: Continue to follow Weight Watchers.  Long: Continue to build these healthy habits into her everyday life.         Core Components/Risk Factors/Patient Goals at Discharge (Final Review):      Goals and Risk Factor Review -  01/06/17 1036      Core Components/Risk Factors/Patient Goals Review   Personal Goals Review Weight Management/Obesity;Hypertension;Lipids   Review Kerigan continues to do well in rehab.  Her blood pressures have continued to do well despite having come off of her medications.   She is still on her Crestor twice a week, which seems to be  working for her.  Her weight continues to come down as well.  She is using Weight [employer] online.  She is down 13 lbs since her admission and is determined to keep it off this time.   Expected Outcomes Short: Continue to follow Weight Watchers.  Long: Continue to build these healthy habits into her everyday life.       ITP Comments:     ITP Comments    Row Name 10/20/16 1405 10/29/16 0552 11/26/16 0642 12/24/16 0614     ITP Comments Medical Review completed . Inital ITP created. Documentation of diagnosis can be found in [facility]  Admission 09/12/2016. 30 day review. Continue with ITP unless directed changes per Medical Director review. New to program 30 day review. Continue with ITP unless directed changes per Medical Director review    30 day review. Continue with ITP unless directed changes per Medical Director review        Comments: Discharge ITP

## 2017-01-21 ENCOUNTER — Encounter: Payer: Self-pay | Admitting: Podiatry

## 2017-01-21 ENCOUNTER — Encounter: Payer: Self-pay | Admitting: *Deleted

## 2017-01-21 ENCOUNTER — Ambulatory Visit (INDEPENDENT_AMBULATORY_CARE_PROVIDER_SITE_OTHER): Payer: Medicare Other | Admitting: Podiatry

## 2017-01-21 DIAGNOSIS — D3613 Benign neoplasm of peripheral nerves and autonomic nervous system of lower limb, including hip: Secondary | ICD-10-CM | POA: Diagnosis not present

## 2017-01-21 DIAGNOSIS — I214 Non-ST elevation (NSTEMI) myocardial infarction: Secondary | ICD-10-CM

## 2017-01-21 NOTE — Progress Notes (Unsigned)
Cardiac Individual Treatment Plan  Patient Details  Name: KATRIANNA FRIESENHAHN MRN: [id number] Date of Birth: 1941-02-27 Referring Provider:     Cardiac Rehab from 10/20/2016 in [facility] Cardiac and Pulmonary Rehab  Referring Provider  Bartholome Bill MD      Initial Encounter Date:    Cardiac Rehab from 10/20/2016 in [facility] Cardiac and Pulmonary Rehab  Date  10/20/16  Referring Provider  Bartholome Bill MD      Visit Diagnosis: NSTEMI (non-ST elevated myocardial infarction) [facility])  Patient's Home Medications on Admission:  Current Outpatient Prescriptions:  .  Aspirin (ASPIR-81 PO), Take 1 tablet by mouth., Disp: , Rfl:  .  b complex vitamins tablet, Take 1 tablet by mouth daily., Disp: , Rfl:  .  Calcium Citrate-Vitamin D (CALCIUM CITRATE + PO), Take 1 tablet by mouth daily., Disp: , Rfl:  .  Cholecalciferol (VITAMIN D3) 2000 UNITS TABS, Take 1 capsule by mouth 2 (two) times daily., Disp: , Rfl:  .  Coenzyme Q10 (COQ10) 100 MG CAPS, Take 1 capsule by mouth daily., Disp: , Rfl:  .  furosemide (LASIX) 20 MG tablet, Take 1 tablet (20 mg total) by mouth daily., Disp: 30 tablet, Rfl: 0 .  metoprolol tartrate (LOPRESSOR) 25 MG tablet, Take 1 tablet (25 mg total) by mouth 2 (two) times daily. (Patient not taking: Reported on 12/08/2016), Disp: 60 tablet, Rfl: 0 .  Misc Natural Products (GLUCOSAMINE CHOND MSM FORMULA PO), Take 1,500 mg by mouth., Disp: , Rfl:  .  Multiple Vitamin (MULTI VITAMIN DAILY PO), Take 1 tablet by mouth., Disp: , Rfl:  .  Omega-3 Fatty Acids (FISH OIL) 1200 MG CAPS, Take 1 capsule by mouth., Disp: , Rfl:  .  rosuvastatin (CRESTOR) 10 MG tablet, Take 10 mg by mouth 2 (two) times a week. Monday, Thursday., Disp: , Rfl:   Past Medical History: Past Medical History:  Diagnosis Date  . Abnormal Pap smear   . Arthritis   . breast cancer 2009   right  . Cancer ([place])   . CHF (congestive heart failure) ([place])   . DDD (degenerative disc disease), lumbar   . High cholesterol   .  Hyperlipidemia   . Hypertension   . Meniscus tear    right  . Personal history of radiation therapy   . RBBB     Tobacco Use: History  Smoking Status  . Never Smoker  Smokeless Tobacco  . Never Used    Labs: Recent Review Flowsheet Data    Labs for ITP Cardiac and Pulmonary Rehab Latest Ref Rng & Units 06/27/2014 10/11/2015 09/13/2016 09/13/2016 09/13/2016   Cholestrol 0 - 200 mg/dL 169 150 114 - -   LDLCALC 0 - 99 mg/dL 94 80 62 - -   HDL >40 mg/dL 47.60 43.50 26(L) - -   Trlycerides <150 mg/dL 136.0 133.0 129 - -   PHART 7.350 - 7.450 - - - 7.48(H) 7.53(H)   PCO2ART 32.0 - 48.0 mmHg - - - 34 31(L)   HCO3 20.0 - 28.0 mmol/L - - - 25.3 25.9   O2SAT % - - - 87.9 98.2       Exercise Target Goals:    Exercise Program Goal: Individual exercise prescription set with THRR, safety & activity barriers. Participant demonstrates ability to understand and report RPE using BORG scale, to self-measure pulse accurately, and to acknowledge the importance of the exercise prescription.  Exercise Prescription Goal: Starting with aerobic activity 30 plus minutes a day, 3 days per week  for initial exercise prescription. Provide home exercise prescription and guidelines that participant acknowledges understanding prior to discharge.  Activity Barriers & Risk Stratification:   6 Minute Walk:     6 Minute Walk    Row Name 12/30/16 0938         6 Minute Walk   Phase Discharge     Distance 1332 feet     Distance % Change 25.7 %  272 ft     Walk Time 6 minutes     # of Rest Breaks 0     MPH 2.52     METS 2.59     RPE 13     VO2 Peak 9.07     Symptoms No     Resting HR 64 bpm     Resting BP 124/72     Max Ex. HR 119 bpm     Max Ex. BP 136/68        Oxygen Initial Assessment:   Oxygen Re-Evaluation:   Oxygen Discharge (Final Oxygen Re-Evaluation):   Initial Exercise Prescription:   Perform Capillary Blood Glucose checks as needed.  Exercise Prescription Changes:      Exercise Prescription Changes    Row Name 12/03/16 1400 12/18/16 1500 12/31/16 1500 01/13/17 1600       Response to Exercise   Blood Pressure (Admit) 126/72 120/62 124/72 124/74    Blood Pressure (Exercise) 136/66 140/74 136/68 146/74    Blood Pressure (Exit) 124/64 132/70 122/72 128/70    Heart Rate (Admit) 59 bpm 62 bpm 64 bpm 67 bpm    Heart Rate (Exercise) 94 bpm 95 bpm 119 bpm 94 bpm    Heart Rate (Exit) 48 bpm 56 bpm 78 bpm 58 bpm    Rating of Perceived Exertion (Exercise) '13 11 13 13    '$ Symptoms none none none none    Duration Continue with 45 min of aerobic exercise without signs/symptoms of physical distress. Continue with 45 min of aerobic exercise without signs/symptoms of physical distress. Continue with 45 min of aerobic exercise without signs/symptoms of physical distress. Continue with 45 min of aerobic exercise without signs/symptoms of physical distress.    Intensity THRR unchanged THRR unchanged THRR unchanged THRR unchanged      Progression   Progression Continue to progress workloads to maintain intensity without signs/symptoms of physical distress. Continue to progress workloads to maintain intensity without signs/symptoms of physical distress. Continue to progress workloads to maintain intensity without signs/symptoms of physical distress. Continue to progress workloads to maintain intensity without signs/symptoms of physical distress.    Average METs 2.7 2.7 3.1 2.76      Resistance Training   Training Prescription Yes Yes Yes Yes    Weight 3 lb 3 lb 3 lb 3 lb    Reps 10-15 10-15 10-15 10-15      Interval Training   Interval Training No No No No      Treadmill   MPH 1.2 1.2 2.2 2.2    Grade 0.5 0.'5 1 1    '$ Minutes '15 15 15 15    '$ METs 2 2 2.99 2.99      NuStep   Level '4 5 5 5    '$ Minutes '15 15 15 15    '$ METs 2.7 2.8 2.9 2.9      REL-XR   Level '1 4 4 4    '$ Minutes '15 15 15 15    '$ METs 3.5 3.3 3.4 2.4      Home Exercise Plan  Plans to continue exercise  at [facility] (comment)  walking, fitness center in complex, [place] (comment)  walking, fitness center in complex, [place] (comment)  walking, fitness center in complex, [place] (comment)  walking, fitness center in complex, [employer]    Frequency Add 2 additional days to program exercise sessions. Add 2 additional days to program exercise sessions. Add 2 additional days to program exercise sessions. Add 2 additional days to program exercise sessions.    Initial Home Exercises Provided 11/04/16 11/04/16 11/04/16 11/04/16       Exercise Comments:     Exercise Comments    Row Name 01/20/17 9163           Exercise Comments Aileana graduated today from cardiac rehab with 36/36 sessions completed.  Details of the patient's exercise prescription and what She needs to do in order to continue the prescription and progress were discussed with patient.  Patient was given a copy of prescription and goals.  Patient verbalized understanding.  Luciel plans to continue to exercise by going to [employer].          Exercise Goals and Review:   Exercise Goals Re-Evaluation :     Exercise Goals Re-Evaluation    Row Name 12/03/16 1426 12/11/16 0858 12/18/16 1527 12/30/16 0939 12/31/16 1522     Exercise Goal Re-Evaluation   Exercise Goals Review Increase Physical Activity;Increase Strenth and Stamina Increase Physical Activity;Increase Strenth and Stamina Increase Physical Activity;Increase Strenth and Stamina Increase Physical Activity;Increase Strenth and Stamina Increase Physical Activity;Increase Strenth and Stamina   Comments Tomekia has been doing well in rehab.  She has been doing well and walked while out on vacation.  She is now up to 3.5 METs on the XR.  We will continue to monitor her progression. Elizet attends [employer] twice per week outside HT.   Hazley has been doing well in rehab.  She is up to level 5 on the NuStep.  She is going to  [employer] regularly.  We will continue to monitor for progression.  Improved post 6MWT by 272 ft!! Ami has been doing well in rehab.  She is nearing graduation and already going to the gym regularly.  She is now up to 2.2 mph on the treadmill.  We will continue to monitor her progression.    Expected Outcomes Short: Areej will start to increase her workload on the XR.  Long: Continue to exercise independently Harrison will continue regular exercise and complete HT.  Long - Latishia will maintain long term regular exercise. Short: Continue to attend classes and [employer] regularly.  Long: Continue to work on IT sales professional.   - Short: Graduate!  Long: Continue to exercise at [employer] and by walking.    [place] Name 01/06/17 1026 01/13/17 1551           Exercise Goal Re-Evaluation   Exercise Goals Review Increase Physical Activity;Increase Strength and Stamina;Understanding of Exercise Prescription Increase Physical Activity;Increase Strength and Stamina      Comments Theodosia continues do well.  She is getting stronger and has more stamina to do more around the house.  She feels as though she is back to where she was before all of this happened.  She continues to attend [employer] on her off day from rehab.    Naimah has been doing well in rehab.  She is getting ready to graduate and has been going to [employer] consisitently. We will continue to monitor her progression.  Expected Outcomes Short: Graduate from rehab.  Long; Continue to exercise independently to maintain what she has gained in the program.   Short: Graduate from rehab.  Long; Continue to exercise independently to maintain what she has gained in the program.           Discharge Exercise Prescription (Final Exercise Prescription Changes):     Exercise Prescription Changes - 01/13/17 1600      Response to Exercise   Blood Pressure (Admit) 124/74   Blood Pressure (Exercise) 146/74   Blood Pressure (Exit) 128/70   Heart Rate (Admit) 67  bpm   Heart Rate (Exercise) 94 bpm   Heart Rate (Exit) 58 bpm   Rating of Perceived Exertion (Exercise) 13   Symptoms none   Duration Continue with 45 min of aerobic exercise without signs/symptoms of physical distress.   Intensity THRR unchanged     Progression   Progression Continue to progress workloads to maintain intensity without signs/symptoms of physical distress.   Average METs 2.76     Resistance Training   Training Prescription Yes   Weight 3 lb   Reps 10-15     Interval Training   Interval Training No     Treadmill   MPH 2.2   Grade 1   Minutes 15   METs 2.99     NuStep   Level 5   Minutes 15   METs 2.9     REL-XR   Level 4   Minutes 15   METs 2.4     Home Exercise Plan   Plans to continue exercise at [facility] (comment)  walking, fitness center in complex, [employer]   Frequency Add 2 additional days to program exercise sessions.   Initial Home Exercises Provided 11/04/16      Nutrition:  Target Goals: Understanding of nutrition guidelines, daily intake of sodium '1500mg'$ , cholesterol '200mg'$ , calories 30% from fat and 7% or less from saturated fats, daily to have 5 or more servings of fruits and vegetables.  Biometrics:      Barbie Haggis - 12/30/16 0939       Post  Biometrics   Height 5' 5.8" (1.671 m)   Weight 208 lb (94.3 kg)   Waist Circumference 38 inches   Hip Circumference 49.5 inches   Waist to Hip Ratio 0.77 %   BMI (Calculated) 33.79   Single Leg Stand 1.55 seconds      Nutrition Therapy Plan and Nutrition Goals:   Nutrition Discharge: Rate Your Plate Scores:     Nutrition Assessments - 12/25/16 0851      MEDFICTS Scores   Pre Score 38   Post Score 34   Score Difference -4      Nutrition Goals Re-Evaluation:     Nutrition Goals Re-Evaluation    Row Name 01/06/17 1041             Goals   Nutrition Goal Read labels for saturated fat, trans fat and sodium.  Balance meals with protein, cards, and  veggies       Comment Vallery has been following Weight Watchers online.  She is also reading food labels and eating balanced meals with more protein. She is pleased with how the combination of diet and exercise are working for her weight loss.          Expected Outcome Short: Continue to follow Weight Watchers.  Long: Maintain heart healthy diet.           Nutrition Goals Discharge (Final  Nutrition Goals Re-Evaluation):     Nutrition Goals Re-Evaluation - 01/06/17 1041      Goals   Nutrition Goal Read labels for saturated fat, trans fat and sodium.  Balance meals with protein, cards, and veggies   Comment Kessler has been following Weight Watchers online.  She is also reading food labels and eating balanced meals with more protein. She is pleased with how the combination of diet and exercise are working for her weight loss.      Expected Outcome Short: Continue to follow Weight Watchers.  Long: Maintain heart healthy diet.       Psychosocial: Target Goals: Acknowledge presence or absence of significant depression and/or stress, maximize coping skills, provide positive support system. Participant is able to verbalize types and ability to use techniques and skills needed for reducing stress and depression.   Initial Review & Psychosocial Screening:   Quality of Life Scores:      Quality of Life - 12/25/16 0851      Quality of Life Scores   Health/Function Pre 20.8 %   Health/Function Post 24.6 %   Health/Function % Change 18.27 %   Socioeconomic Pre 28.8 %   Socioeconomic Post 25 %   Socioeconomic % Change  -13.19 %   Psych/Spiritual Pre 22.29 %   Psych/Spiritual Post 23.14 %   Psych/Spiritual % Change 3.81 %   Family Pre 28.8 %   Family Post 24 %   Family % Change -16.67 %   GLOBAL Pre 23.63 %   GLOBAL Post 24.27 %   GLOBAL % Change 2.71 %      PHQ-9: Recent Review Flowsheet Data    Depression screen [facility] 2/9 12/25/2016 11/06/2016 11/03/2016 10/20/2016 10/16/2016   Decreased  Interest 0 0 0 0 0   Down, Depressed, Hopeless 0 0 0 0 0   PHQ - 2 Score 0 0 0 0 0   Altered sleeping 0 - - 1 -   Tired, decreased energy 2 - - 1  -   Change in appetite 0 - - 0 -   Feeling bad or failure about yourself  0 - - 0 -   Trouble concentrating 0 - - 0 -   Moving slowly or fidgety/restless 0 - - 0 -   Suicidal thoughts 0 - - 0 -   PHQ-9 Score 2 - - 2 -   Difficult doing work/chores Somewhat difficult - - Somewhat difficult -     Interpretation of Total Score  Total Score Depression Severity:  1-4 = Minimal depression, 5-9 = Mild depression, 10-14 = Moderate depression, 15-19 = Moderately severe depression, 20-27 = Severe depression   Psychosocial Evaluation and Intervention:   Psychosocial Re-Evaluation:     Psychosocial Re-Evaluation    Row Name 01/06/17 1135             Psychosocial Re-Evaluation   Current issues with Current Stress Concerns       Comments Daryle is doing well mentatlly.  She feels stronger than she has before; she is back to where she was before all this happened and able to do more around the house again.  She continues to sleep well and is still involved in her church for support.  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  She is coping well with these and maintains a positive attitude!       Expected Outcomes Short: Continue to be postive.  Long: Continue to do  more around house and staying active in the community.       Interventions Encouraged to attend Cardiac Rehabilitation for the exercise       Continue Psychosocial Services  Follow up required by staff       Comments Stressors continue to be HOA and healthy issues.          Initial Review   Source of Stress Concerns Occupation;Chronic Illness          Psychosocial Discharge (Final Psychosocial Re-Evaluation):     Psychosocial Re-Evaluation - 01/06/17 1135      Psychosocial Re-Evaluation   Current issues with Current Stress Concerns   Comments  Melvinia is doing well mentatlly.  She feels stronger than she has before; she is back to where she was before all this happened and able to do more around the house again.  She continues to sleep well and is still involved in her church for support.  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  She is coping well with these and maintains a positive attitude!   Expected Outcomes Short: Continue to be postive.  Long: Continue to do more around house and staying active in the community.   Interventions Encouraged to attend Cardiac Rehabilitation for the exercise   Continue Psychosocial Services  Follow up required by staff   Comments Stressors continue to be HOA and healthy issues.      Initial Review   Source of Stress Concerns Occupation;Chronic Illness      Vocational Rehabilitation: Provide vocational rehab assistance to qualifying candidates.   Vocational Rehab Evaluation & Intervention:   Education: Education Goals: Education classes will be provided on a variety of topics geared toward better understanding of heart health and risk factor modification. Participant will state understanding/return demonstration of topics presented as noted by education test scores.  Learning Barriers/Preferences:   Education Topics: General Nutrition Guidelines/Fats and Fiber: -Group instruction provided by verbal, written material, models and posters to present the general guidelines for heart healthy nutrition. Gives an explanation and review of dietary fats and fiber.   Cardiac Rehab from 01/20/2017 in [facility] Cardiac and Pulmonary Rehab  Date  12/30/16  Educator  CR  Instruction Review Code  1- Verbalizes Understanding      Controlling Sodium/Reading Food Labels: -Group verbal and written material supporting the discussion of sodium use in heart healthy nutrition. Review and explanation with models, verbal and written materials for utilization of the food label.    Cardiac Rehab from 01/20/2017 in [facility] Cardiac and Pulmonary Rehab  Date  11/11/16  Educator  PI  Instruction Review Code  1- Verbalizes Understanding      Exercise Physiology & Risk Factors: - Group verbal and written instruction with models to review the exercise physiology of the cardiovascular system and associated critical values. Details cardiovascular disease risk factors and the goals associated with each risk factor.   Cardiac Rehab from 01/20/2017 in [facility] Cardiac and Pulmonary Rehab  Date  01/15/17  Educator  [facility]  Instruction Review Code  1- Verbalizes Understanding      Aerobic Exercise & Resistance Training: - Gives group verbal and written discussion on the health impact of inactivity. On the components of aerobic and resistive training programs and the benefits of this training and how to safely progress through these programs.   Cardiac Rehab from 01/20/2017 in [facility] Cardiac and Pulmonary Rehab  Date  01/20/17  Educator  [facility]  Instruction Review  Code  1- Geologist, engineering, Balance, General Exercise Guidelines: - Provides group verbal and written instruction on the benefits of flexibility and balance training programs. Provides general exercise guidelines with specific guidelines to those with heart or lung disease. Demonstration and skill practice provided.   Stress Management: - Provides group verbal and written instruction about the health risks of elevated stress, cause of high stress, and healthy ways to reduce stress.   Cardiac Rehab from 01/20/2017 in [facility] Cardiac and Pulmonary Rehab  Date  12/02/16  Educator  [place]      Depression: - Provides group verbal and written instruction on the correlation between heart/lung disease and depressed mood, treatment options, and the stigmas associated with seeking treatment.   Cardiac Rehab from 01/20/2017 in [facility] Cardiac and Pulmonary Rehab  Date  12/25/16  Educator  [facility]  Instruction Review Code (retired)   2- meets Designer, fashion/clothing & Physiology of the Heart: - Group verbal and written instruction and models provide basic cardiac anatomy and physiology, with the coronary electrical and arterial systems. Review of: AMI, Angina, Valve disease, Heart Failure, Cardiac Arrhythmia, Pacemakers, and the ICD.   Cardiac Rehab from 01/20/2017 in [facility] Cardiac and Pulmonary Rehab  Date  12/04/16  Educator  [facility]      Cardiac Procedures: - Group verbal and written instruction to review commonly prescribed medications for heart disease. Reviews the medication, class of the drug, and side effects. Includes the steps to properly store meds and maintain the prescription regimen. (beta blockers and nitrates)   Cardiac Rehab from 01/20/2017 in [facility] Cardiac and Pulmonary Rehab  Date  12/09/16  Educator  CE      Cardiac Medications I: - Group verbal and written instruction to review commonly prescribed medications for heart disease. Reviews the medication, class of the drug, and side effects. Includes the steps to properly store meds and maintain the prescription regimen.   Cardiac Rehab from 01/20/2017 in [facility] Cardiac and Pulmonary Rehab  Date  12/16/16 [8/7 Part One 8/9 Part Two]  Educator  SB      Cardiac Medications II: -Group verbal and written instruction to review commonly prescribed medications for heart disease. Reviews the medication, class of the drug, and side effects. (all other drug classes)    Go Sex-Intimacy & Heart Disease, Get SMART - Goal Setting: - Group verbal and written instruction through game format to discuss heart disease and the return to sexual intimacy. Provides group verbal and written material to discuss and apply goal setting through the application of the S.M.A.R.T. Method.   Cardiac Rehab from 01/20/2017 in [facility] Cardiac and Pulmonary Rehab  Date  12/09/16  Educator  CE      Other Matters of the Heart: - Provides group verbal, written materials and models to  describe Heart Failure, Angina, Valve Disease, Peripheral Artery Disease, and Diabetes in the realm of heart disease. Includes description of the disease process and treatment options available to the cardiac patient.   Cardiac Rehab from 01/20/2017 in [facility] Cardiac and Pulmonary Rehab  Date  12/04/16  Educator  [facility]      Exercise & Equipment Safety: - Individual verbal instruction and demonstration of equipment use and safety with use of the equipment.   Cardiac Rehab from 01/20/2017 in [facility] Cardiac and Pulmonary Rehab  Date  10/20/16  Educator  SB      Infection Prevention: - Provides verbal and written material to individual with  discussion of infection control including proper hand washing and proper equipment cleaning during exercise session.   Cardiac Rehab from 01/20/2017 in [facility] Cardiac and Pulmonary Rehab  Date  10/20/16  Educator  Sb      Falls Prevention: - Provides verbal and written material to individual with discussion of falls prevention and safety.   Cardiac Rehab from 01/20/2017 in [facility] Cardiac and Pulmonary Rehab  Date  10/20/16  Educator  Sb  Instruction Review Code (retired)  2- meets goals/outcomes      Diabetes: - Individual verbal and written instruction to review signs/symptoms of diabetes, desired ranges of glucose level fasting, after meals and with exercise. Acknowledge that pre and post exercise glucose checks will be done for 3 sessions at entry of program.   Other: -Provides group and verbal instruction on various topics (see comments)    Knowledge Questionnaire Score:     Knowledge Questionnaire Score - 12/25/16 0851      Knowledge Questionnaire Score   Pre Score 24/28   Post Score 28/28      Core Components/Risk Factors/Patient Goals at Admission:   Core Components/Risk Factors/Patient Goals Review:      Goals and Risk Factor Review    Row Name 12/11/16 0853 01/06/17 1036           Core Components/Risk Factors/Patient Goals  Review   Personal Goals Review Weight Management/Obesity;Hypertension Weight Management/Obesity;Hypertension;Lipids      Review Sherin has had BP readings within normal range during class.  She is off her BP med due to hair loss.  She has met with the RD and feels she knows what to eat and eats well but weight loss is a challenge.  She is feeling "almost back to normal". Trenee continues to do well in rehab.  Her blood pressures have continued to do well despite having come off of her medications.   She is still on her Crestor twice a week, which seems to be working for her.  Her weight continues to come down as well.  She is using [employer] online.  She is down 13 lbs since her admission and is determined to keep it off this time.      Expected Outcomes Short - Yovana will continue to attend class regularly.  Long - Hafsah will complete HT and continue healthy habits.  Short: Continue to follow [employer].  Long: Continue to build these healthy habits into her everyday life.          Core Components/Risk Factors/Patient Goals at Discharge (Final Review):      Goals and Risk Factor Review - 01/06/17 1036      Core Components/Risk Factors/Patient Goals Review   Personal Goals Review Weight Management/Obesity;Hypertension;Lipids   Review Ramsha continues to do well in rehab.  Her blood pressures have continued to do well despite having come off of her medications.   She is still on her Crestor twice a week, which seems to be working for her.  Her weight continues to come down as well.  She is using [employer] online.  She is down 13 lbs since her admission and is determined to keep it off this time.   Expected Outcomes Short: Continue to follow [employer].  Long: Continue to build these healthy habits into her everyday life.       ITP Comments:     ITP Comments    Row Name 11/26/16 [phone] 12/24/16 0614 01/21/17 1112       ITP Comments  30 day review. Continue with ITP unless  directed changes per Medical Director review    30 day review. Continue with ITP unless directed changes per Medical Director review  30 day review. Continue with ITP unless directed changes per Medical Director review.          Comments:

## 2017-01-21 NOTE — Progress Notes (Signed)
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 She states that is not doing much better at this point. She had 2 missed several injections because she had was in the hospital. We started her injections back last visit.  Objective: Vital signs are stable she is alert and oriented 3. Pulses are palpable. She has a palpable Mulder's click to the third interdigital space of the left foot.  Assessment: Neuroma third interdigital space left foot.  Plan: Minimal 15 minutes was spent with this patient discussing diagnosis and treatment options. We chose to continue dehydrated alcohol injections. I injected the area today with dehydrated alcohol this was her second injection to this area since we reinitiated therapy. Follow up with her in 3 weeks

## 2017-02-10 DIAGNOSIS — N183 Chronic kidney disease, stage 3 (moderate): Secondary | ICD-10-CM | POA: Diagnosis not present

## 2017-02-10 DIAGNOSIS — E785 Hyperlipidemia, unspecified: Secondary | ICD-10-CM | POA: Diagnosis not present

## 2017-02-10 DIAGNOSIS — M9903 Segmental and somatic dysfunction of lumbar region: Secondary | ICD-10-CM | POA: Diagnosis not present

## 2017-02-10 DIAGNOSIS — I5032 Chronic diastolic (congestive) heart failure: Secondary | ICD-10-CM | POA: Diagnosis not present

## 2017-02-10 DIAGNOSIS — M5136 Other intervertebral disc degeneration, lumbar region: Secondary | ICD-10-CM | POA: Diagnosis not present

## 2017-02-10 DIAGNOSIS — M62830 Muscle spasm of back: Secondary | ICD-10-CM | POA: Diagnosis not present

## 2017-02-10 DIAGNOSIS — I10 Essential (primary) hypertension: Secondary | ICD-10-CM | POA: Diagnosis not present

## 2017-02-10 DIAGNOSIS — M9902 Segmental and somatic dysfunction of thoracic region: Secondary | ICD-10-CM | POA: Diagnosis not present

## 2017-02-11 ENCOUNTER — Encounter: Payer: Self-pay | Admitting: Podiatry

## 2017-02-11 ENCOUNTER — Ambulatory Visit (INDEPENDENT_AMBULATORY_CARE_PROVIDER_SITE_OTHER): Payer: Medicare Other | Admitting: Podiatry

## 2017-02-11 DIAGNOSIS — D3613 Benign neoplasm of peripheral nerves and autonomic nervous system of lower limb, including hip: Secondary | ICD-10-CM | POA: Diagnosis not present

## 2017-02-11 NOTE — Progress Notes (Signed)
She presents today for her second dose of dehydrated alcohol third interdigital space of the left foot. She had previously been in the hospital and none was unable to have her injections continued. They were helping her at that point but have now become painful once again. She states that after the first or last injection did not help at all.  Objective: Palpable Mulder's click third interspace left foot pulses are still palpable.  Assessment: Pain in limb secondary to neuroma third interspace left.  Plan: Injected her second dose of dehydrated alcohol today. Follow-up with her 3-4 weeks.

## 2017-03-10 DIAGNOSIS — Z23 Encounter for immunization: Secondary | ICD-10-CM | POA: Diagnosis not present

## 2017-03-10 DIAGNOSIS — M9903 Segmental and somatic dysfunction of lumbar region: Secondary | ICD-10-CM | POA: Diagnosis not present

## 2017-03-10 DIAGNOSIS — M5136 Other intervertebral disc degeneration, lumbar region: Secondary | ICD-10-CM | POA: Diagnosis not present

## 2017-03-10 DIAGNOSIS — M9902 Segmental and somatic dysfunction of thoracic region: Secondary | ICD-10-CM | POA: Diagnosis not present

## 2017-03-10 DIAGNOSIS — M62830 Muscle spasm of back: Secondary | ICD-10-CM | POA: Diagnosis not present

## 2017-03-11 ENCOUNTER — Encounter: Payer: Self-pay | Admitting: Podiatry

## 2017-03-11 ENCOUNTER — Ambulatory Visit (INDEPENDENT_AMBULATORY_CARE_PROVIDER_SITE_OTHER): Payer: Medicare Other | Admitting: Podiatry

## 2017-03-11 DIAGNOSIS — D3613 Benign neoplasm of peripheral nerves and autonomic nervous system of lower limb, including hip: Secondary | ICD-10-CM

## 2017-03-11 NOTE — Progress Notes (Signed)
She follows up today for her third dose of dehydrated alcohol. She states that after the second and second patella was getting some better.  Objective: Palpable Mulder's click 30 digital space of the left foot. Pulses remain palpable bilateral.  Assessment: Neuroma slowly resolving third interdigital space left foot.  Plan: Injected third dose of dehydrated alcohol today will follow up with her in 1 month.

## 2017-03-16 ENCOUNTER — Ambulatory Visit (INDEPENDENT_AMBULATORY_CARE_PROVIDER_SITE_OTHER): Payer: Medicare Other | Admitting: Obstetrics & Gynecology

## 2017-03-16 VITALS — BP 143/83 | HR 73 | Wt 201.6 lb

## 2017-03-16 DIAGNOSIS — Z01419 Encounter for gynecological examination (general) (routine) without abnormal findings: Secondary | ICD-10-CM

## 2017-03-16 NOTE — Progress Notes (Signed)
Subjective:    Darlene Lawrence is a 76 y.o. MW female who presents for an annual exam. The patient has no complaints today. The patient is sexually active. GYN screening history: last pap: was normal. The patient wears seatbelts: yes. The patient participates in regular exercise: yes. Has the patient ever been transfused or tattooed?: no. The patient reports that there is not domestic violence in her life.   Menstrual History: OB History    Gravida Para Term Preterm AB Living   3 2 2  0 1 2   SAB TAB Ectopic Multiple Live Births   1 0 0 0 2      Menarche age: 24 No LMP recorded. Patient is postmenopausal.    The following portions of the patient's history were reviewed and updated as appropriate: allergies, current medications, past family history, past medical history, past social history, past surgical history and problem list.  Review of Systems Pertinent items are noted in HPI.   Son at [employer], senior, graduating in 5/19, has a job scheduled at [employer] Married for 6 years Had a heart attack 5/18 while in the hospital for pneumonia Mammogram 4/18 She has had her flu vaccine this season   Objective:    BP (!) 143/83   Pulse 73   Wt 201 lb 9.6 oz (91.4 kg)   BMI 32.74 kg/m   General Appearance:    Alert, cooperative, no distress, appears stated age  Head:    Normocephalic, without obvious abnormality, atraumatic  Eyes:    PERRL, conjunctiva/corneas clear, EOM's intact, fundi    benign, both eyes  Ears:    Normal TM's and external ear canals, both ears  Nose:   Nares normal, septum midline, mucosa normal, no drainage    or sinus tenderness  Throat:   Lips, mucosa, and tongue normal; teeth and gums normal  Neck:   Supple, symmetrical, trachea midline, no adenopathy;    thyroid:  no enlargement/tenderness/nodules; no carotid   bruit or JVD  Back:     Symmetric, no curvature, ROM normal, no CVA tenderness  Lungs:     Clear to auscultation bilaterally, respirations  unlabored  Chest Wall:    No tenderness or deformity   Heart:    Regular rate and rhythm, S1 and S2 normal, no murmur, rub   or gallop  Breast Exam:    No tenderness, masses, or nipple abnormality  Abdomen:     Soft, non-tender, bowel sounds active all four quadrants,    no masses, no organomegaly  Genitalia:    Normal female without lesion, discharge or tenderness, atrophy, grade 4 cystocele, no masses palpable with bimanual exam (She has tried various pessaries with no success. She was scheduled for surgical repair several years ago but changed her mind)     Extremities:   Extremities normal, atraumatic, no cyanosis or edema  Pulses:   2+ and symmetric all extremities  Skin:   Skin color, texture, turgor normal, no rashes or lesions  Lymph nodes:   Cervical, supraclavicular, and axillary nodes normal  Neurologic:   CNII-XII intact, normal strength, sensation and reflexes    throughout  .    Assessment:    Healthy female exam.    Plan:     Rec healthy lifestyle

## 2017-04-06 ENCOUNTER — Encounter: Payer: Self-pay | Admitting: Podiatry

## 2017-04-06 ENCOUNTER — Ambulatory Visit (INDEPENDENT_AMBULATORY_CARE_PROVIDER_SITE_OTHER): Payer: Medicare Other | Admitting: Podiatry

## 2017-04-06 DIAGNOSIS — D3613 Benign neoplasm of peripheral nerves and autonomic nervous system of lower limb, including hip: Secondary | ICD-10-CM

## 2017-04-06 NOTE — Progress Notes (Signed)
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 States that she's having some pain across the dorsum of both feet particularly at night when she gets up to go to the bathroom. She states that the neuroma feels about the same in the left foot.  Objective: Vital signs are stable she is alert and oriented 3 dorsal spurring of Lisfranc's joints are consistent with osteoarthritic changes of the midfoot more than likely consistent with her pain. She still has palpable click to the third interdigital space of the left foot.  Assessment: Neuroma third interdigital space left foot. 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  Plan: We discussed etiology pathology conservative versus surgical therapies. At this point after giving consent we injected her fourth dose of dehydrated alcohol 4% dehydrated alcohol and local anesthetic was injected into the third interdigital space of the left foot between the metatarsal heads. This is performed after local area was cleaned with and/or Betadine. She tolerated procedure well and will follow up with me in 2-3 weeks for another injection.

## 2017-04-07 DIAGNOSIS — M9902 Segmental and somatic dysfunction of thoracic region: Secondary | ICD-10-CM | POA: Diagnosis not present

## 2017-04-07 DIAGNOSIS — M5136 Other intervertebral disc degeneration, lumbar region: Secondary | ICD-10-CM | POA: Diagnosis not present

## 2017-04-07 DIAGNOSIS — M62830 Muscle spasm of back: Secondary | ICD-10-CM | POA: Diagnosis not present

## 2017-04-07 DIAGNOSIS — M9903 Segmental and somatic dysfunction of lumbar region: Secondary | ICD-10-CM | POA: Diagnosis not present

## 2017-04-28 DIAGNOSIS — M9903 Segmental and somatic dysfunction of lumbar region: Secondary | ICD-10-CM | POA: Diagnosis not present

## 2017-04-28 DIAGNOSIS — M9902 Segmental and somatic dysfunction of thoracic region: Secondary | ICD-10-CM | POA: Diagnosis not present

## 2017-04-28 DIAGNOSIS — M62830 Muscle spasm of back: Secondary | ICD-10-CM | POA: Diagnosis not present

## 2017-04-28 DIAGNOSIS — M5136 Other intervertebral disc degeneration, lumbar region: Secondary | ICD-10-CM | POA: Diagnosis not present

## 2017-05-13 ENCOUNTER — Ambulatory Visit: Payer: Medicare Other | Admitting: Podiatry

## 2017-05-13 ENCOUNTER — Ambulatory Visit (INDEPENDENT_AMBULATORY_CARE_PROVIDER_SITE_OTHER): Payer: Medicare Other | Admitting: Podiatry

## 2017-05-13 ENCOUNTER — Encounter: Payer: Self-pay | Admitting: Podiatry

## 2017-05-13 DIAGNOSIS — D3613 Benign neoplasm of peripheral nerves and autonomic nervous system of lower limb, including hip: Secondary | ICD-10-CM | POA: Diagnosis not present

## 2017-05-13 NOTE — Progress Notes (Signed)
She presents today for follow-up of her neuroma left foot.  She states that it might foot in general hurt so much it is hard to tell if the neuroma is doing any better or not.  Objective: Vital signs are stable alert and oriented x3.  Pulses are palpable.  Her neuroma is doing much better on the left foot no symptoms at all in the right foot still has a palpable Mulder's click third interdigital space left.  He has pain across the dorsum of the foot overlying the Lisfranc's joints consistent with her OA.  Assessment osteoarthritis bilateral neuroma resolving left.  Plan: Injected another dose of dehydrated alcohol to the left foot.  This was performed after sterile Betadine skin prep.  Tolerated procedure well and a Band-Aid was placed.  Verbal permission was granted for the injection.  Also had her casted today for accommodative orthotics.

## 2017-05-26 DIAGNOSIS — M62830 Muscle spasm of back: Secondary | ICD-10-CM | POA: Diagnosis not present

## 2017-05-26 DIAGNOSIS — M9902 Segmental and somatic dysfunction of thoracic region: Secondary | ICD-10-CM | POA: Diagnosis not present

## 2017-05-26 DIAGNOSIS — M9903 Segmental and somatic dysfunction of lumbar region: Secondary | ICD-10-CM | POA: Diagnosis not present

## 2017-05-26 DIAGNOSIS — M5136 Other intervertebral disc degeneration, lumbar region: Secondary | ICD-10-CM | POA: Diagnosis not present

## 2017-06-03 ENCOUNTER — Encounter: Payer: Medicare Other | Admitting: Orthotics

## 2017-06-08 DIAGNOSIS — M722 Plantar fascial fibromatosis: Secondary | ICD-10-CM

## 2017-06-17 ENCOUNTER — Ambulatory Visit (INDEPENDENT_AMBULATORY_CARE_PROVIDER_SITE_OTHER): Payer: Medicare Other | Admitting: Orthotics

## 2017-06-17 DIAGNOSIS — M2042 Other hammer toe(s) (acquired), left foot: Secondary | ICD-10-CM

## 2017-06-17 DIAGNOSIS — M779 Enthesopathy, unspecified: Secondary | ICD-10-CM

## 2017-06-17 DIAGNOSIS — M778 Other enthesopathies, not elsewhere classified: Secondary | ICD-10-CM

## 2017-06-17 DIAGNOSIS — D3613 Benign neoplasm of peripheral nerves and autonomic nervous system of lower limb, including hip: Secondary | ICD-10-CM

## 2017-06-17 NOTE — Progress Notes (Signed)
Patient came in today to pick up custom made foot orthotics.  The goals were accomplished and the patient reported no dissatisfaction with said orthotics.  Patient was advised of breakin period and how to report any issues.  $300

## 2017-06-23 DIAGNOSIS — M62830 Muscle spasm of back: Secondary | ICD-10-CM | POA: Diagnosis not present

## 2017-06-23 DIAGNOSIS — M9902 Segmental and somatic dysfunction of thoracic region: Secondary | ICD-10-CM | POA: Diagnosis not present

## 2017-06-23 DIAGNOSIS — M9903 Segmental and somatic dysfunction of lumbar region: Secondary | ICD-10-CM | POA: Diagnosis not present

## 2017-06-23 DIAGNOSIS — M5136 Other intervertebral disc degeneration, lumbar region: Secondary | ICD-10-CM | POA: Diagnosis not present

## 2017-07-21 DIAGNOSIS — M9902 Segmental and somatic dysfunction of thoracic region: Secondary | ICD-10-CM | POA: Diagnosis not present

## 2017-07-21 DIAGNOSIS — M9903 Segmental and somatic dysfunction of lumbar region: Secondary | ICD-10-CM | POA: Diagnosis not present

## 2017-07-21 DIAGNOSIS — M5136 Other intervertebral disc degeneration, lumbar region: Secondary | ICD-10-CM | POA: Diagnosis not present

## 2017-07-21 DIAGNOSIS — M62830 Muscle spasm of back: Secondary | ICD-10-CM | POA: Diagnosis not present

## 2017-08-11 DIAGNOSIS — N183 Chronic kidney disease, stage 3 (moderate): Secondary | ICD-10-CM | POA: Diagnosis not present

## 2017-08-11 DIAGNOSIS — E785 Hyperlipidemia, unspecified: Secondary | ICD-10-CM | POA: Diagnosis not present

## 2017-08-11 DIAGNOSIS — I10 Essential (primary) hypertension: Secondary | ICD-10-CM | POA: Diagnosis not present

## 2017-08-11 DIAGNOSIS — I5032 Chronic diastolic (congestive) heart failure: Secondary | ICD-10-CM | POA: Diagnosis not present

## 2017-08-12 ENCOUNTER — Other Ambulatory Visit (HOSPITAL_COMMUNITY): Payer: Self-pay | Admitting: Obstetrics & Gynecology

## 2017-08-12 DIAGNOSIS — Z1231 Encounter for screening mammogram for malignant neoplasm of breast: Secondary | ICD-10-CM

## 2017-08-18 DIAGNOSIS — M9903 Segmental and somatic dysfunction of lumbar region: Secondary | ICD-10-CM | POA: Diagnosis not present

## 2017-08-18 DIAGNOSIS — M62830 Muscle spasm of back: Secondary | ICD-10-CM | POA: Diagnosis not present

## 2017-08-18 DIAGNOSIS — M5136 Other intervertebral disc degeneration, lumbar region: Secondary | ICD-10-CM | POA: Diagnosis not present

## 2017-08-18 DIAGNOSIS — M9902 Segmental and somatic dysfunction of thoracic region: Secondary | ICD-10-CM | POA: Diagnosis not present

## 2017-08-31 ENCOUNTER — Ambulatory Visit
Admission: RE | Admit: 2017-08-31 | Discharge: 2017-08-31 | Disposition: A | Discharge status: Home / Self Care | Payer: Medicare Other | Source: Ambulatory Visit | Attending: Obstetrics & Gynecology | Admitting: Obstetrics & Gynecology

## 2017-08-31 ENCOUNTER — Other Ambulatory Visit: Payer: Self-pay | Admitting: *Deleted

## 2017-08-31 DIAGNOSIS — Z1231 Encounter for screening mammogram for malignant neoplasm of breast: Secondary | ICD-10-CM

## 2017-09-21 DIAGNOSIS — M9903 Segmental and somatic dysfunction of lumbar region: Secondary | ICD-10-CM | POA: Diagnosis not present

## 2017-09-21 DIAGNOSIS — M5136 Other intervertebral disc degeneration, lumbar region: Secondary | ICD-10-CM | POA: Diagnosis not present

## 2017-09-21 DIAGNOSIS — M62830 Muscle spasm of back: Secondary | ICD-10-CM | POA: Diagnosis not present

## 2017-09-21 DIAGNOSIS — M9902 Segmental and somatic dysfunction of thoracic region: Secondary | ICD-10-CM | POA: Diagnosis not present

## 2017-10-20 ENCOUNTER — Inpatient Hospital Stay: Payer: Medicare Other | Attending: Adult Health | Admitting: Adult Health

## 2017-10-20 DIAGNOSIS — M9903 Segmental and somatic dysfunction of lumbar region: Secondary | ICD-10-CM | POA: Diagnosis not present

## 2017-10-20 DIAGNOSIS — M9902 Segmental and somatic dysfunction of thoracic region: Secondary | ICD-10-CM | POA: Diagnosis not present

## 2017-10-20 DIAGNOSIS — M5136 Other intervertebral disc degeneration, lumbar region: Secondary | ICD-10-CM | POA: Diagnosis not present

## 2017-10-20 DIAGNOSIS — M62830 Muscle spasm of back: Secondary | ICD-10-CM | POA: Diagnosis not present

## 2017-10-20 NOTE — Progress Notes (Deleted)
CLINIC:  [facility]   REASON FOR VISIT:  Routine follow-up for history of breast cancer.   BRIEF ONCOLOGIC HISTORY:    Malignant neoplasm of right female breast ([place])   07/14/2007 Mammogram    Mass in the right breast: Ultrasound showed a spiculated mass 10:00 position 7 x 7 x 8 mm      08/18/2007 Initial Diagnosis    Invasive mammary cancer; ER+ (90%), PR+ (59%), Ki67 9%, HER2/neu negative      08/26/2007 Breast MRI    Right breast upper outer quadrant 9 x 8 x 8 mm mass without lymphadenopathy      08/26/2007 Clinical Stage    Stage IA: T1b N0      10/06/2007 Surgery    Right breast lumpectomy with SLN biopsy, grade 1 IDC 1 cm with associated DCIS no LVI, ER 90%, PR 58%, Ki-67 9%, HER-2 negative. 0/1 LN      10/06/2007 Pathologic Stage    Stage IA: T1b N0      11/24/2007 - 12/24/2007 Radiation Therapy    Adjuvant radiation therapy to the right breast      01/13/2008 - 01/13/2013 Anti-estrogen oral therapy    Tamoxifen 20 mg daily x 5 years        INTERVAL HISTORY:  Darlene Lawrence presents to the [facility] today for routine follow-up for her history of breast cancer.  Overall, she reports feeling quite well. ***    REVIEW OF SYSTEMS:  Review of Systems - Oncology Breast: Denies any new nodularity, masses, tenderness, nipple changes, or nipple discharge.       PAST MEDICAL/SURGICAL HISTORY:  Past Medical History:  Diagnosis Date  . Abnormal Pap smear   . Arthritis   . breast cancer 2009   right  . Cancer ([place])   . CHF (congestive heart failure) ([place])   . DDD (degenerative disc disease), lumbar   . High cholesterol   . Hyperlipidemia   . Hypertension   . Meniscus tear    right  . Personal history of radiation therapy   . RBBB    Past Surgical History:  Procedure Laterality Date  . BREAST BIOPSY Right 08/18/2007  . BREAST LUMPECTOMY Right 2009  . BREAST SURGERY Right 2009   lumpectomy, no radiation, no chemo  . DILATION AND CURETTAGE OF  UTERUS  2012   Dr. Fermin Schwab  . HERNIA REPAIR     umbilical  . JOINT REPLACEMENT Left    hip  . LEFT HEART CATH AND CORONARY ANGIOGRAPHY N/A 09/17/2016   Procedure: Left Heart Cath and Coronary Angiography;  Surgeon: Teodoro Spray, MD;  Location: [facility];  Service: Cardiovascular;  Laterality: N/A;  . TOTAL HIP ARTHROPLASTY Left   . 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 TUBAL LIGATION       ALLERGIES:  Allergies  Allergen Reactions  . Cefixime     REACTION: Rash  . Levaquin [Levofloxacin] Other (See Comments)    Shoulder Bursitis  . Penicillins     REACTION: Rash     CURRENT MEDICATIONS:  Outpatient Encounter Medications as of 10/20/2017  Medication Sig Note  . Aspirin (ASPIR-81 PO) Take 1 tablet by mouth.   Marland Kitchen b complex vitamins tablet Take 1 tablet by mouth daily.   . Calcium Citrate-Vitamin D (CALCIUM CITRATE + PO) Take 1 tablet by mouth  daily.   . Cholecalciferol (VITAMIN D3) 2000 UNITS TABS Take 1 capsule by mouth 2 (two) times daily.   . Coenzyme Q10 (COQ10) 100 MG CAPS Take 1 capsule by mouth daily.   . furosemide (LASIX) 20 MG tablet Take 1 tablet (20 mg total) by mouth daily. (Patient not taking: Reported on 03/16/2017)   . metoprolol tartrate (LOPRESSOR) 25 MG tablet Take 1 tablet (25 mg total) by mouth 2 (two) times daily. (Patient not taking: Reported on 12/08/2016) 12/08/2016: Patient reports this was discontinued by cardiologist "last week" due to patient's report of significant hair loss when taking medication  . Misc Natural Products (GLUCOSAMINE CHOND MSM FORMULA PO) Take 1,500 mg by mouth.   . Multiple Vitamin (MULTI VITAMIN DAILY PO) Take 1 tablet by mouth.   . Omega-3 Fatty Acids (FISH OIL) 1200 MG CAPS Take 1 capsule by mouth.   . rosuvastatin (CRESTOR) 10 MG tablet Take 10 mg by mouth 2 (two) times a  week. Monday, Thursday.   . triamterene-hydrochlorothiazide (MAXZIDE-25) 37.5-25 MG tablet Take by mouth.    No facility-administered encounter medications on file as of 10/20/2017.      ONCOLOGIC FAMILY HISTORY:  Family History  Problem Relation Age of Onset  . Cancer Mother        breast and colon  . Breast cancer Mother   . Hypertension Father   . Heart disease Father   . Thyroid disease Father   . Down syndrome Son   . Asthma Other     GENETIC COUNSELING/TESTING: ***  SOCIAL HISTORY:  Darlene Lawrence is /single/married/divorced/widowed/separated and lives alone/with her spouse/family/friend in (city), [place].  She has (#) children and they live in (city).  [name] is currently retired/disabled/working part-time/full-time as ***.  She denies any current or history of tobacco, alcohol, or illicit drug use.     PHYSICAL EXAMINATION:  Vital Signs: There were no vitals filed for this visit. There were no vitals filed for this visit. General: Well-nourished, well-appearing female in no acute distress.  Unaccompanied/Accompanied by***** today.   HEENT: Head is normocephalic.  Pupils equal and reactive to light. Conjunctivae clear without exudate.  Sclerae anicteric. Oral mucosa is pink, moist.  Oropharynx is pink without lesions or erythema.  Lymph: No cervical, supraclavicular, or infraclavicular lymphadenopathy noted on palpation.  Cardiovascular: Regular rate and rhythm.. Respiratory: Clear to auscultation bilaterally. Chest expansion symmetric; breathing non-labored.  Breast Exam:  -Left breast: No appreciable masses on palpation. No skin redness, thickening, or peau d'orange appearance; no nipple retraction or nipple discharge; mild distortion in symmetry at previous lumpectomy site***healed scar without erythema or nodularity.  -Right breast: No appreciable masses on palpation. No skin redness, thickening, or peau d'orange appearance; no nipple retraction or  nipple discharge; mild distortion in symmetry at previous lumpectomy site***healed scar without erythema or nodularity. -Axilla: No axillary adenopathy bilaterally.  GI: Abdomen soft and round; non-tender, non-distended. Bowel sounds normoactive. No hepatosplenomegaly.   GU: Deferred.  Neuro: No focal deficits. Steady gait.  Psych: Mood and affect normal and appropriate for situation.  MSK: No focal spinal tenderness to palpation, full range of motion in bilateral upper extremities Extremities: No edema. Skin: Warm and dry.  LABORATORY DATA:  None for this visit***   DIAGNOSTIC IMAGING:  Most recent mammogram:      ASSESSMENT AND PLAN:  Ms.. Lingafelter is a pleasant 77 y.o. female with history of Stage IA right breast invasive ductal carcinoma, ER+/PR+/HER2-, diagnosed in 08/2007, treated with lumpectomy, adjuvant   radiation therapy, and anti-estrogen therapy with Tamoxifen x 5 years completing therapy in 01/2013.  [facility] and routine follow-up.   1. History of breast cancer:  Ms. Heier is currently clinically and radiographically without evidence of disease or recurrence of breast cancer. She will be due for mammogram in 08/2018.  She will return in one year for LTS follow up.  I encouraged her to call me with any questions or concerns before her next visit at the cancer center, and I would be happy to see her sooner, if needed.    #. Problem(s) at Visit___________________.  #. Bone health:  Given Ms. Hunkele's age, history of breast cancer, she is at risk for bone demineralization. Her last DEXA scan was on 08/21/2014 and was consistent with osteopenia with a T score of -1.4.    She was given education on specific food and activities to promote bone health.  #. Cancer screening:  Due to Ms. Kerth's history and her age, she should receive screening for skin cancers, colon cancer, and ***gynecologic cancers. She was encouraged to follow-up with  her PCP for appropriate cancer screenings.   #. Health maintenance and wellness promotion: Ms. Galer was encouraged to consume 5-7 servings of fruits and vegetables per day. She was also encouraged to engage in moderate to vigorous exercise for 30 minutes per day most days of the week. She was instructed to limit her alcohol consumption and continue to abstain from tobacco use/was encouraged stop smoking.  ***    Dispo:  -Return to cancer center *** -Mammogram in 08/2018   A total of (30) minutes of face-to-face time was spent with this patient with greater than 50% of that time in counseling and care-coordination.   Gardenia Phlegm, [place] [phone]   Note: PRIMARY CARE PROVIDER Jinny Sanders, [place] [phone]

## 2017-11-05 ENCOUNTER — Telehealth: Payer: Self-pay | Admitting: Family Medicine

## 2017-11-05 DIAGNOSIS — E782 Mixed hyperlipidemia: Secondary | ICD-10-CM

## 2017-11-05 DIAGNOSIS — D696 Thrombocytopenia, unspecified: Secondary | ICD-10-CM

## 2017-11-05 NOTE — Telephone Encounter (Signed)
-----   Message from Eustace Pen, LPN sent at 4/51/4604  4:23 PM EDT ----- Regarding: Labs 6/28 Lab orders needed. Thank you.  Insurance:  [employer]

## 2017-11-06 ENCOUNTER — Ambulatory Visit: Payer: Medicare Other

## 2017-11-06 ENCOUNTER — Ambulatory Visit (INDEPENDENT_AMBULATORY_CARE_PROVIDER_SITE_OTHER): Payer: Medicare Other

## 2017-11-06 VITALS — BP 100/80 | HR 54 | Temp 98.8°F | Ht 64.5 in | Wt 212.8 lb

## 2017-11-06 DIAGNOSIS — I10 Essential (primary) hypertension: Secondary | ICD-10-CM | POA: Diagnosis not present

## 2017-11-06 DIAGNOSIS — Z0000 Encounter for general adult medical examination without abnormal findings: Secondary | ICD-10-CM | POA: Diagnosis not present

## 2017-11-06 DIAGNOSIS — E782 Mixed hyperlipidemia: Secondary | ICD-10-CM

## 2017-11-06 LAB — CBC WITH DIFFERENTIAL/PLATELET
Basophils Absolute: 0 10*3/uL (ref 0.0–0.1)
Basophils Relative: 0.7 % (ref 0.0–3.0)
EOS ABS: 0.2 10*3/uL (ref 0.0–0.7)
EOS PCT: 4 % (ref 0.0–5.0)
HCT: 39.3 % (ref 36.0–46.0)
HEMOGLOBIN: 13.3 g/dL (ref 12.0–15.0)
Lymphocytes Relative: 54.4 % — ABNORMAL HIGH (ref 12.0–46.0)
Lymphs Abs: 3 10*3/uL (ref 0.7–4.0)
MCHC: 33.9 g/dL (ref 30.0–36.0)
MCV: 101.3 fl — ABNORMAL HIGH (ref 78.0–100.0)
MONO ABS: 0.4 10*3/uL (ref 0.1–1.0)
Monocytes Relative: 7.9 % (ref 3.0–12.0)
Neutro Abs: 1.8 10*3/uL (ref 1.4–7.7)
Neutrophils Relative %: 33 % — ABNORMAL LOW (ref 43.0–77.0)
Platelets: 154 10*3/uL (ref 150.0–400.0)
RBC: 3.88 Mil/uL (ref 3.87–5.11)
RDW: 13.5 % (ref 11.5–15.5)
WBC: 5.5 10*3/uL (ref 4.0–10.5)

## 2017-11-06 LAB — COMPREHENSIVE METABOLIC PANEL
ALK PHOS: 64 U/L (ref 39–117)
ALT: 23 U/L (ref 0–35)
AST: 22 U/L (ref 0–37)
Albumin: 4.1 g/dL (ref 3.5–5.2)
BUN: 23 mg/dL (ref 6–23)
CHLORIDE: 103 meq/L (ref 96–112)
CO2: 30 mEq/L (ref 19–32)
Calcium: 9.6 mg/dL (ref 8.4–10.5)
Creatinine, Ser: 1.16 mg/dL (ref 0.40–1.20)
GFR: 48.4 mL/min — ABNORMAL LOW (ref >60.00)
Glucose, Bld: 94 mg/dL (ref 70–99)
Potassium: 4.4 mEq/L (ref 3.5–5.1)
SODIUM: 140 meq/L (ref 135–145)
Total Bilirubin: 0.5 mg/dL (ref 0.2–1.2)
Total Protein: 7.6 g/dL (ref 6.0–8.3)

## 2017-11-06 LAB — LIPID PANEL
Cholesterol: 172 mg/dL (ref 0–200)
HDL: 39.6 mg/dL (ref >39.00)
LDL Cholesterol: 93 mg/dL (ref 0–99)
NONHDL: 132.76
Total CHOL/HDL Ratio: 4
Triglycerides: 199 mg/dL — ABNORMAL HIGH (ref 0.0–149.0)
VLDL: 39.8 mg/dL (ref 0.0–40.0)

## 2017-11-06 NOTE — Patient Instructions (Signed)
Darlene Lawrence , Thank you for taking time to come for your Medicare Wellness Visit. I appreciate your ongoing commitment to your health goals. Please review the following plan we discussed and let me know if I can assist you in the future.   These are the goals we discussed: Goals    . Patient Stated     Starting 11/06/2017, I will resume exercising for 30 minutes 3 days per week.        This is a list of the screening recommended for you and due dates:  Health Maintenance  Topic Date Due  . Flu Shot  12/10/2017  . Tetanus Vaccine  11/10/2021  . Colon Cancer Screening  10/11/2023  . DEXA scan (bone density measurement)  Completed  . Pneumonia vaccines  Completed   Preventive Care for Adults  A healthy lifestyle and preventive care can promote health and wellness. Preventive health guidelines for adults include the following key practices.  . A routine yearly physical is a good way to check with your health care provider about your health and preventive screening. It is a chance to share any concerns and updates on your health and to receive a thorough exam.  . Visit your dentist for a routine exam and preventive care every 6 months. Brush your teeth twice a day and floss once a day. Good oral hygiene prevents tooth decay and gum disease.  . The frequency of eye exams is based on your age, health, family medical history, use  of contact lenses, and other factors. Follow your health care provider's recommendations for frequency of eye exams.  . Eat a healthy diet. Foods like vegetables, fruits, whole grains, low-fat dairy products, and lean protein foods contain the nutrients you need without too many calories. Decrease your intake of foods high in solid fats, added sugars, and salt. Eat the right amount of calories for you. Get information about a proper diet from your health care provider, if necessary.  . Regular physical exercise is one of the most important things you can do for  your health. Most adults should get at least 150 minutes of moderate-intensity exercise (any activity that increases your heart rate and causes you to sweat) each week. In addition, most adults need muscle-strengthening exercises on 2 or more days a week.  Silver Sneakers may be a benefit available to you. To determine eligibility, you may visit the website: www.silversneakers.com or contact program at [phone] Mon-Fri between 8AM-8PM.   . Maintain a healthy weight. The body mass index (BMI) is a screening tool to identify possible weight problems. It provides an estimate of body fat based on height and weight. Your health care provider can find your BMI and can help you achieve or maintain a healthy weight.   For adults 20 years and older: ? A BMI below 18.5 is considered underweight. ? A BMI of 18.5 to 24.9 is normal. ? A BMI of 25 to 29.9 is considered overweight. ? A BMI of 30 and above is considered obese.   . Maintain normal blood lipids and cholesterol levels by exercising and minimizing your intake of saturated fat. Eat a balanced diet with plenty of fruit and vegetables. Blood tests for lipids and cholesterol should begin at age 34 and be repeated every 5 years. If your lipid or cholesterol levels are high, you are over 50, or you are at high risk for heart disease, you may need your cholesterol levels checked more frequently. Ongoing high lipid and cholesterol  levels should be treated with medicines if diet and exercise are not working.  . If you smoke, find out from your health care provider how to quit. If you do not use tobacco, please do not start.  . If you choose to drink alcohol, please do not consume more than 2 drinks per day. One drink is considered to be 12 ounces (355 mL) of beer, 5 ounces (148 mL) of wine, or 1.5 ounces (44 mL) of liquor.  . If you are 49-22 years old, ask your health care provider if you should take aspirin to prevent strokes.  . Use sunscreen.  Apply sunscreen liberally and repeatedly throughout the day. You should seek shade when your shadow is shorter than you. Protect yourself by wearing long sleeves, pants, a wide-brimmed hat, and sunglasses year round, whenever you are outdoors.  . Once a month, do a whole body skin exam, using a mirror to look at the skin on your back. Tell your health care provider of new moles, moles that have irregular borders, moles that are larger than a pencil eraser, or moles that have changed in shape or color.

## 2017-11-06 NOTE — Progress Notes (Signed)
Subjective:   Darlene Lawrence is a 77 y.o. female who presents for Medicare Annual (Subsequent) preventive examination.  Review of Systems:  N/A Cardiac Risk Factors include: advanced age (>46men, >18 women);obesity (BMI >30kg/m2);dyslipidemia;hypertension     Objective:     Vitals: BP 100/80 (BP Location: Right Arm, Patient Position: Sitting, Cuff Size: Normal)   Pulse (!) 54   Temp 98.8 F (37.1 C) (Oral)   Ht 5' 4.5" (1.638 m) Comment: no shoes  Wt 212 lb 12 oz (96.5 kg)   SpO2 96%   BMI 35.95 kg/m   Body mass index is 35.95 kg/m.  Advanced Directives 11/06/2017 11/06/2016 11/03/2016 10/20/2016 09/29/2016 09/24/2016 09/17/2016  Does Patient Have a Medical Advance Directive? Yes Yes Yes Yes Yes Yes Yes  Type of Paramedic of [place];Living will [place];Living will [place];Living will [place];Living will [place];Living will [place];Living will [place]  Does patient want to make changes to medical advance directive? - - - - - No - Patient declined No - Patient declined  Copy of [place] in Chart? Yes - Yes - - No - copy requested No - copy requested  Pre-existing out of facility DNR order (yellow form or pink MOST form) - - - - - - -    Tobacco Social History   Tobacco Use  Smoking Status Never Smoker  Smokeless Tobacco Never Used     Counseling given: No   Clinical Intake:  Pre-visit preparation completed: Yes  Pain : No/denies pain Pain Score: 0-No pain     Nutritional Status: BMI 25 -29 Overweight Nutritional Risks: None Diabetes: No  How often do you need to have someone help you when you read instructions, pamphlets, or other written materials from your doctor or pharmacy?: 1 - Never What is the last grade level you completed in school?: Associates degree  Interpreter Needed?:  No  Comments: pt lives with spouse Information entered by :: LPinson, LPN  Past Medical History:  Diagnosis Date  . Abnormal Pap smear   . Arthritis   . breast cancer 2009   right  . Cancer ([place])   . CHF (congestive heart failure) ([place])   . DDD (degenerative disc disease), lumbar   . High cholesterol   . Hyperlipidemia   . Hypertension   . Meniscus tear    right  . Personal history of radiation therapy   . RBBB    Past Surgical History:  Procedure Laterality Date  . BREAST BIOPSY Right 08/18/2007  . BREAST LUMPECTOMY Right 2009  . BREAST SURGERY Right 2009   lumpectomy, no radiation, no chemo  . DILATION AND CURETTAGE OF UTERUS  2012   Dr. Fermin Schwab  . HERNIA REPAIR     umbilical  . JOINT REPLACEMENT Left    hip  . LEFT HEART CATH AND CORONARY ANGIOGRAPHY N/A 09/17/2016   Procedure: Left Heart Cath and Coronary Angiography;  Surgeon: Teodoro Spray, MD;  Location: [facility];  Service: Cardiovascular;  Laterality: N/A;  . TOTAL HIP ARTHROPLASTY Left   . 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 TUBAL LIGATION     Family History  Problem Relation Age of Onset  . Cancer Mother  breast and colon  . Breast cancer Mother   . Hypertension Father   . Heart disease Father   . Thyroid disease Father   . Down syndrome Son   . Asthma Other    Social History   Socioeconomic History  . Marital status: Married    Spouse name: Not on file  . Number of children: 2  . Years of education: Not on file  . Highest education level: Not on file  Occupational History  . 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 Financial resource strain: Not on file  . Food insecurity:    Worry: Not on file    Inability: Not on file  . Transportation needs:    Medical: Not on file     Non-medical: Not on file  Tobacco Use  . Smoking status: Never Smoker  . Smokeless tobacco: Never Used  Substance and Sexual Activity  . Alcohol use: Yes    Comment: once a month  . Drug use: No  . Sexual activity: Yes    Partners: Male    Birth control/protection: Post-menopausal  Lifestyle  . Physical activity:    Days per week: Not on file    Minutes per session: Not on file  . Stress: Not on file  Relationships  . Social connections:    Talks on phone: Not on file    Gets together: Not on file    Attends religious service: Not on file    Active member of club or organization: Not on file    Attends meetings of clubs or organizations: Not on file    Relationship status: Not on file  Other Topics Concern  . Not on file  Social History Narrative   Regular exercise--yes, curves 3 times a week   Diet-- fruits and veggies, water, no fast food        Outpatient Encounter Medications as of 11/06/2017  Medication Sig  . Aspirin (ASPIR-81 PO) Take 1 tablet by mouth.  Marland Kitchen b complex vitamins tablet Take 1 tablet by mouth daily.  . Calcium Citrate-Vitamin D (CALCIUM CITRATE + PO) Take 1 tablet by mouth daily.  . Cholecalciferol (VITAMIN D3) 2000 UNITS TABS Take 1 capsule by mouth 2 (two) times daily.  . Coenzyme Q10 (COQ10) 100 MG CAPS Take 1 capsule by mouth daily.  . Misc Natural Products (GLUCOSAMINE CHOND MSM FORMULA PO) Take 1,500 mg by mouth.  . Multiple Vitamin (MULTI VITAMIN DAILY PO) Take 1 tablet by mouth.  . Omega-3 Fatty Acids (FISH OIL) 1200 MG CAPS Take 1 capsule by mouth.  . rosuvastatin (CRESTOR) 10 MG tablet Take 10 mg by mouth 2 (two) times a week. Monday, Thursday.  . triamterene-hydrochlorothiazide (MAXZIDE-25) 37.5-25 MG tablet Take by mouth.  . [DISCONTINUED] furosemide (LASIX) 20 MG tablet Take 1 tablet (20 mg total) by mouth daily. (Patient not taking: Reported on 11/06/2017)  . [DISCONTINUED] metoprolol tartrate (LOPRESSOR) 25 MG tablet Take 1 tablet (25 mg  total) by mouth 2 (two) times daily. (Patient not taking: Reported on 11/06/2017)   No facility-administered encounter medications on file as of 11/06/2017.     Activities of Daily Living In your present state of health, do you have any difficulty performing the following activities: 11/06/2017 11/06/2016  Hearing? N N  Vision? N N  Difficulty concentrating or making decisions? N N  Walking or climbing stairs? N Y  Dressing or bathing? N N  Doing errands, shopping?  N N  Preparing Food and eating ? N -  Using the Toilet? N -  In the past six months, have you accidently leaked urine? Y -  Do you have problems with loss of bowel control? N -  Managing your Medications? N -  Managing your Finances? N -  Housekeeping or managing your Housekeeping? N -  Some recent data might be hidden    Patient Care Team: Jinny Sanders, MD as PCP - General Nicholas Lose, MD as Consulting Physician (Hematology and Oncology) Gaynelle Arabian, MD as Consulting Physician (Orthopedic Surgery) Emily Filbert, MD as Consulting Physician (Obstetrics and Gynecology) Joan Flores Richard Miu, DO as Referring Physician (Optometry) Alisa Graff, FNP as Nurse Practitioner (Family Medicine) Teodoro Spray, MD as Consulting Physician (Cardiology)    Assessment:   This is a routine wellness examination for [place].   Hearing Screening   125Hz  250Hz  500Hz  1000Hz  2000Hz  3000Hz  4000Hz  6000Hz  8000Hz   Right ear:   40 0 40  40    Left ear:   40 0 40  40    Vision Screening Comments: Vision exam in Aug 2018 with Dr. Annamaria Helling  Exercise Activities and Dietary recommendations Current Exercise Habits: The patient does not participate in regular exercise at present, Exercise limited by: None identified  Goals    . Patient Stated     Starting 11/06/2017, I will resume exercising for 30 minutes 3 days per week.        Fall Risk Fall Risk  11/06/2017 12/08/2016 11/07/2016 11/06/2016 11/03/2016  Falls in the past year? No (No Data) (No  Data) No No  Comment - No new falls reported today No new falls reported today - -  Risk for fall due to : - - - - -  Risk for fall due to: Comment - - - - -   Depression Screen PHQ 2/9 Scores 11/06/2017 12/25/2016 11/06/2016 11/03/2016  PHQ - 2 Score 0 0 0 0  PHQ- 9 Score 0 2 - -     Cognitive Function MMSE - Mini Mental State Exam 11/06/2017 11/03/2016 10/11/2015  Orientation to time 5 5 5   Orientation to Place 5 5 5   Registration 3 3 3   Attention/ Calculation 0 0 0  Recall 3 3 3   Language- name 2 objects 0 0 0  Language- repeat 1 1 1   Language- follow 3 step command 3 3 3   Language- read & follow direction 0 0 0  Write a sentence 0 0 0  Copy design 0 0 0  Total score 20 20 20         Immunization History  Administered Date(s) Administered  . Influenza Split 02/19/2011, 02/23/2012  . Influenza Whole 02/10/2007, 02/15/2008, 03/06/2010  . Influenza,inj,Quad PF,6+ Mos 02/20/2014, 03/10/2017  . Influenza-Unspecified 02/05/2016  . Pneumococcal Conjugate-13 07/04/2014  . Pneumococcal Polysaccharide-23 07/11/2008  . Td 09/09/2000  . Tdap 11/11/2011  . Zoster 11/14/2009    Screening Tests Health Maintenance  Topic Date Due  . INFLUENZA VACCINE  12/10/2017  . TETANUS/TDAP  11/10/2021  . COLONOSCOPY  10/11/2023  . DEXA SCAN  Completed  . PNA vac Low Risk Adult  Completed       Plan:     I have personally reviewed, addressed, and noted the following in the patient's chart:  A. Medical and social history B. Use of alcohol, tobacco or illicit drugs  C. Current medications and supplements D. Functional ability and status E.  Nutritional status F.  Physical activity  G. Advance directives H. List of other physicians I.  Hospitalizations, surgeries, and ER visits in previous 12 months J.  [place] to include hearing, vision, cognitive, depression L. Referrals and appointments - none  In addition, I have reviewed and discussed with patient certain preventive  protocols, quality metrics, and best practice recommendations. A written personalized care plan for preventive services as well as general preventive health recommendations were provided to patient.  See attached scanned questionnaire for additional information.   Signed,   Lindell Noe, MHA, BS, LPN Health Coach

## 2017-11-06 NOTE — Progress Notes (Signed)
PCP notes:   Health maintenance:  No gaps identified.  Abnormal screenings:   Hearing - failed  Hearing Screening   125Hz  250Hz  500Hz  1000Hz  2000Hz  3000Hz  4000Hz  6000Hz  8000Hz   Right ear:   40 0 40  40    Left ear:   40 0 40  40     Patient concerns:   None  Nurse concerns:  None  Next PCP appt:   11/13/17 @ 1115

## 2017-11-13 ENCOUNTER — Ambulatory Visit (INDEPENDENT_AMBULATORY_CARE_PROVIDER_SITE_OTHER): Payer: Medicare Other | Admitting: Family Medicine

## 2017-11-13 ENCOUNTER — Encounter: Payer: Self-pay | Admitting: *Deleted

## 2017-11-13 ENCOUNTER — Encounter: Payer: Self-pay | Admitting: Family Medicine

## 2017-11-13 VITALS — BP 124/70 | HR 51 | Temp 98.5°F | Ht 64.5 in | Wt 213.2 lb

## 2017-11-13 DIAGNOSIS — Z853 Personal history of malignant neoplasm of breast: Secondary | ICD-10-CM | POA: Diagnosis not present

## 2017-11-13 DIAGNOSIS — I5033 Acute on chronic diastolic (congestive) heart failure: Secondary | ICD-10-CM

## 2017-11-13 DIAGNOSIS — I10 Essential (primary) hypertension: Secondary | ICD-10-CM | POA: Diagnosis not present

## 2017-11-13 DIAGNOSIS — E782 Mixed hyperlipidemia: Secondary | ICD-10-CM | POA: Diagnosis not present

## 2017-11-13 DIAGNOSIS — N183 Chronic kidney disease, stage 3 unspecified: Secondary | ICD-10-CM

## 2017-11-13 DIAGNOSIS — D696 Thrombocytopenia, unspecified: Secondary | ICD-10-CM | POA: Diagnosis not present

## 2017-11-13 DIAGNOSIS — I2510 Atherosclerotic heart disease of native coronary artery without angina pectoris: Secondary | ICD-10-CM

## 2017-11-13 NOTE — Assessment & Plan Note (Signed)
Goal < 100 no < 70 LDL given no CAD. Pt can consider adding additional day of crestor if she would like to lower.

## 2017-11-13 NOTE — Assessment & Plan Note (Signed)
Nml mammogram.

## 2017-11-13 NOTE — Progress Notes (Signed)
Subjective:    Patient ID: Darlene Lawrence, female    DOB: Feb 07, 1941, 77 y.o.   MRN: [id number]  HPI The patient presents for complete physical and review of chronic health problems. He/She also has the following acute concerns today: NONE  The patient saw Candis Musa, LPN for medicare wellness. Note reviewed in detail and important notes copied below. Health maintenance:  No gaps identified.  Abnormal screenings:   Hearing - failed             Hearing Screening   125Hz  250Hz  500Hz  1000Hz  2000Hz  3000Hz  4000Hz  6000Hz  8000Hz   Right ear:   40 0 40  40    Left ear:   40 0 40  40     Patient concerns:   None   11/13/17 TODAY  Hypertension:    Good control on  maxide BP Readings from Last 3 Encounters:  11/13/17 124/70  11/06/17 100/80  03/16/17 (!) 143/83  Using medication without problems or lightheadedness: none Chest pain with exertion: none Edema:none Short of breath: none Average home BPs: Other issues:   CAD, hx of NSTEMI  Due to demand ischemia with PNA (normal coronaries), CHF with preserved EF: Followed by Cards, Dr. Ubaldo Glassing asa 81 mg  Last OV reviewed 4.2019   Hyperlipidemia  LDL tolerable on crestor 10 mg  2 times a week. DOES NOT HAVE CAD. Lab Results  Component Value Date   CHOL 172 11/06/2017   HDL 39.60 11/06/2017   LDLCALC 93 11/06/2017   TRIG 199.0 (H) 11/06/2017   CHOLHDL 4 11/06/2017    CKD stage 3:GFR: 48   Thrombocytopenia: resolved. Platelet 154.  Social History /Family History/Past Medical History reviewed in detail and updated in EMR if needed. Blood pressure 124/70, pulse (!) 51, temperature 98.5 F (36.9 C), temperature source Oral, height 5' 4.5" (1.638 m), weight 213 lb 4 oz (96.7 kg).  Review of Systems  Constitutional: Negative for fatigue and fever.  HENT: Negative for ear pain.   Eyes: Negative for pain.  Respiratory: Negative for chest tightness and shortness of breath.   Cardiovascular: Negative for  chest pain, palpitations and leg swelling.  Gastrointestinal: Negative for abdominal pain.  Genitourinary: Negative for dysuria.       Objective:   Physical Exam  Constitutional: Vital signs are normal. She appears well-developed and well-nourished. She is cooperative.  Non-toxic appearance. She does not appear ill. No distress.  HENT:  Head: Normocephalic.  Right Ear: Hearing, tympanic membrane, external ear and ear canal normal. Tympanic membrane is not erythematous, not retracted and not bulging.  Left Ear: Hearing, tympanic membrane, external ear and ear canal normal. Tympanic membrane is not erythematous, not retracted and not bulging.  Nose: No mucosal edema or rhinorrhea. Right sinus exhibits no maxillary sinus tenderness and no frontal sinus tenderness. Left sinus exhibits no maxillary sinus tenderness and no frontal sinus tenderness.  Mouth/Throat: Uvula is midline, oropharynx is clear and moist and mucous membranes are normal.  Eyes: Pupils are equal, round, and reactive to light. Conjunctivae, EOM and lids are normal. Lids are everted and swept, no foreign bodies found.  Neck: Trachea normal and normal range of motion. Neck supple. Carotid bruit is not present. No thyroid mass and no thyromegaly present.  Cardiovascular: Normal rate, regular rhythm, S1 normal, S2 normal, normal heart sounds, intact distal pulses and normal pulses. Exam reveals no gallop and no friction rub.  No murmur heard. Pulmonary/Chest: Effort normal and breath sounds normal. No tachypnea. No  respiratory distress. She has no decreased breath sounds. She has no wheezes. She has no rhonchi. She has no rales.  Abdominal: Soft. Normal appearance and bowel sounds are normal. There is no tenderness.  Neurological: She is alert.  Skin: Skin is warm, dry and intact. No rash noted.  Psychiatric: Her speech is normal and behavior is normal. Judgment and thought content normal. Her mood appears not anxious. Cognition and  memory are normal. She does not exhibit a depressed mood.          Assessment & Plan:  The patient's preventative maintenance and recommended screening tests for an annual wellness exam were reviewed in full today. Brought up to date unless services declined.  Counselled on the importance of diet, exercise, and its role in overall health and mortality. The patient's FH and SH was reviewed, including their home life, tobacco status, and drug and alcohol status.   Vaccines: Uptodate . PAP/DVE: Per Dr. Hulan Fray. Following uterine prolapse. Decided to not move ahead with hysterectomy given SE risks. Breast exam/mammogram:  08/2017 nml, hx of breast cancer 2009 Colonoscopy: Dr. Cristina Gong 10/2013 polyps repeat in 5 years. Nonsmoker  DEXA:  osteopenia in 2016, repeat in 2-5 years.

## 2017-11-13 NOTE — Assessment & Plan Note (Signed)
Stable control. 

## 2017-11-13 NOTE — Assessment & Plan Note (Signed)
Euvolemic. Followed by [facility], Dr. Ubaldo Glassing.

## 2017-11-13 NOTE — Assessment & Plan Note (Signed)
Well controlled. Continue current medication.  

## 2017-11-13 NOTE — Patient Instructions (Signed)
Keep working on healthy eating and regular exercise . Goal of weight loss.

## 2017-11-17 DIAGNOSIS — M5136 Other intervertebral disc degeneration, lumbar region: Secondary | ICD-10-CM | POA: Diagnosis not present

## 2017-11-17 DIAGNOSIS — M9902 Segmental and somatic dysfunction of thoracic region: Secondary | ICD-10-CM | POA: Diagnosis not present

## 2017-11-17 DIAGNOSIS — M62830 Muscle spasm of back: Secondary | ICD-10-CM | POA: Diagnosis not present

## 2017-11-17 DIAGNOSIS — M9903 Segmental and somatic dysfunction of lumbar region: Secondary | ICD-10-CM | POA: Diagnosis not present

## 2017-11-19 NOTE — Progress Notes (Signed)
I reviewed health advisor's note, was available for consultation, and agree with documentation and plan.  

## 2017-12-21 DIAGNOSIS — M9903 Segmental and somatic dysfunction of lumbar region: Secondary | ICD-10-CM | POA: Diagnosis not present

## 2017-12-21 DIAGNOSIS — M62830 Muscle spasm of back: Secondary | ICD-10-CM | POA: Diagnosis not present

## 2017-12-21 DIAGNOSIS — M5136 Other intervertebral disc degeneration, lumbar region: Secondary | ICD-10-CM | POA: Diagnosis not present

## 2017-12-21 DIAGNOSIS — M9902 Segmental and somatic dysfunction of thoracic region: Secondary | ICD-10-CM | POA: Diagnosis not present

## 2017-12-28 ENCOUNTER — Ambulatory Visit (INDEPENDENT_AMBULATORY_CARE_PROVIDER_SITE_OTHER): Payer: Medicare Other | Admitting: Podiatry

## 2017-12-28 ENCOUNTER — Encounter: Payer: Self-pay | Admitting: Podiatry

## 2017-12-28 DIAGNOSIS — M778 Other enthesopathies, not elsewhere classified: Secondary | ICD-10-CM

## 2017-12-28 DIAGNOSIS — M779 Enthesopathy, unspecified: Secondary | ICD-10-CM

## 2017-12-28 DIAGNOSIS — I2510 Atherosclerotic heart disease of native coronary artery without angina pectoris: Secondary | ICD-10-CM | POA: Diagnosis not present

## 2017-12-28 NOTE — Progress Notes (Signed)
She presents today chief complaint of pain to the left foot she states that she has had aching and pain to the dorsal lateral aspect of the foot x1 month denies any injuries states that she had pains for from arthritis and thinks that may be could be that usually hurts from sitting and standing position.  Objective: Vital signs are stable alert and oriented x3.  Pulses are palpable.  Neurologic sensorium is intact.  Degenerative flexors are intact.  Motor strength normal symmetrical.  She has pain on palpation overlying the fifth tarsometatarsal joint.  Assessment: Appears to be capsulitis fourth tarsometatarsal joint left foot.  Plan: I sterile Betadine skin prep injected 10 mg Kenalog 5 mg Marcaine point maximal tenderness of the left foot.  Recommend appropriate shoe gear and follow-up with her in 1 month to 6 weeks.

## 2018-01-06 ENCOUNTER — Ambulatory Visit: Payer: Medicare Other | Admitting: Orthotics

## 2018-01-06 DIAGNOSIS — M779 Enthesopathy, unspecified: Principal | ICD-10-CM

## 2018-01-06 DIAGNOSIS — M778 Other enthesopathies, not elsewhere classified: Secondary | ICD-10-CM

## 2018-01-06 NOTE — Progress Notes (Signed)
Going to have f/o remade with valgus RF wedge to tak e presure of peroneals

## 2018-01-27 ENCOUNTER — Ambulatory Visit: Payer: Medicare Other | Admitting: Orthotics

## 2018-01-27 DIAGNOSIS — M779 Enthesopathy, unspecified: Principal | ICD-10-CM

## 2018-01-27 DIAGNOSIS — M778 Other enthesopathies, not elsewhere classified: Secondary | ICD-10-CM

## 2018-01-27 DIAGNOSIS — D3613 Benign neoplasm of peripheral nerves and autonomic nervous system of lower limb, including hip: Secondary | ICD-10-CM

## 2018-01-27 DIAGNOSIS — H2513 Age-related nuclear cataract, bilateral: Secondary | ICD-10-CM | POA: Diagnosis not present

## 2018-01-27 DIAGNOSIS — M2042 Other hammer toe(s) (acquired), left foot: Secondary | ICD-10-CM

## 2018-01-27 NOTE — Progress Notes (Signed)
Patient came in today to pick up custom made foot orthotics.  The goals were accomplished and the patient reported no dissatisfaction with said orthotics.  Patient was advised of breakin period and how to report any issues. 

## 2018-02-02 DIAGNOSIS — M9902 Segmental and somatic dysfunction of thoracic region: Secondary | ICD-10-CM | POA: Diagnosis not present

## 2018-02-02 DIAGNOSIS — M9903 Segmental and somatic dysfunction of lumbar region: Secondary | ICD-10-CM | POA: Diagnosis not present

## 2018-02-02 DIAGNOSIS — M5136 Other intervertebral disc degeneration, lumbar region: Secondary | ICD-10-CM | POA: Diagnosis not present

## 2018-02-02 DIAGNOSIS — M62830 Muscle spasm of back: Secondary | ICD-10-CM | POA: Diagnosis not present

## 2018-02-16 DIAGNOSIS — I214 Non-ST elevation (NSTEMI) myocardial infarction: Secondary | ICD-10-CM | POA: Diagnosis not present

## 2018-02-16 DIAGNOSIS — E782 Mixed hyperlipidemia: Secondary | ICD-10-CM | POA: Diagnosis not present

## 2018-02-16 DIAGNOSIS — N183 Chronic kidney disease, stage 3 (moderate): Secondary | ICD-10-CM | POA: Diagnosis not present

## 2018-02-16 DIAGNOSIS — I5032 Chronic diastolic (congestive) heart failure: Secondary | ICD-10-CM | POA: Diagnosis not present

## 2018-02-16 DIAGNOSIS — I10 Essential (primary) hypertension: Secondary | ICD-10-CM | POA: Diagnosis not present

## 2018-03-09 DIAGNOSIS — M62830 Muscle spasm of back: Secondary | ICD-10-CM | POA: Diagnosis not present

## 2018-03-09 DIAGNOSIS — M9902 Segmental and somatic dysfunction of thoracic region: Secondary | ICD-10-CM | POA: Diagnosis not present

## 2018-03-09 DIAGNOSIS — M5136 Other intervertebral disc degeneration, lumbar region: Secondary | ICD-10-CM | POA: Diagnosis not present

## 2018-03-09 DIAGNOSIS — M9903 Segmental and somatic dysfunction of lumbar region: Secondary | ICD-10-CM | POA: Diagnosis not present

## 2018-03-09 DIAGNOSIS — Z23 Encounter for immunization: Secondary | ICD-10-CM | POA: Diagnosis not present

## 2018-03-21 IMAGING — CT CT ANGIO CHEST
2 of 6 series · 18 of 46 positions shown · IV contrast (APPLIED)
Comparison: Chest radiographs 09/12/2016 and earlier.

CLINICAL DATA: 75-year-old female with NSTEMI. Abnormal left lung
on chest x-ray yesterday.

EXAM:
CT ANGIOGRAPHY CHEST WITH CONTRAST
TECHNIQUE: Multidetector CT imaging of the chest was performed using the
standard protocol during bolus administration of intravenous
contrast. Multiplanar CT image reconstructions and MIPs were
obtained to evaluate the vascular anatomy.
CONTRAST:  60 mL Isovue 370

[Series 5: thins · axial · 0.70mm/px · z∈[-333,-64]mm · 16 of 295 slices shown]
[im 13/295  lung]
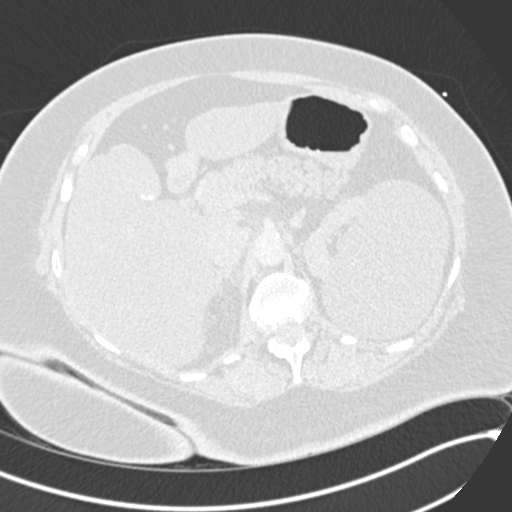
[im 39/295  soft-tissue]
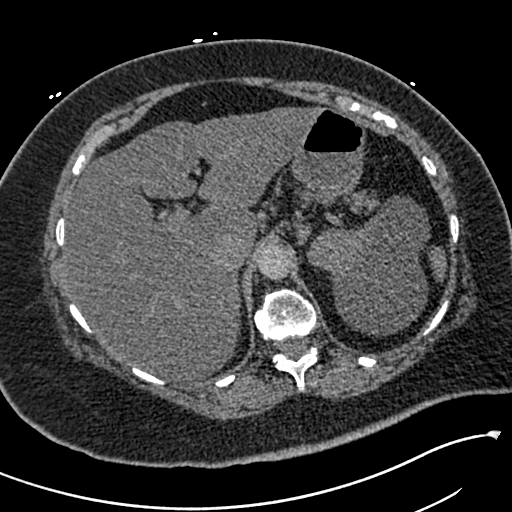
[im 52/295  lung]
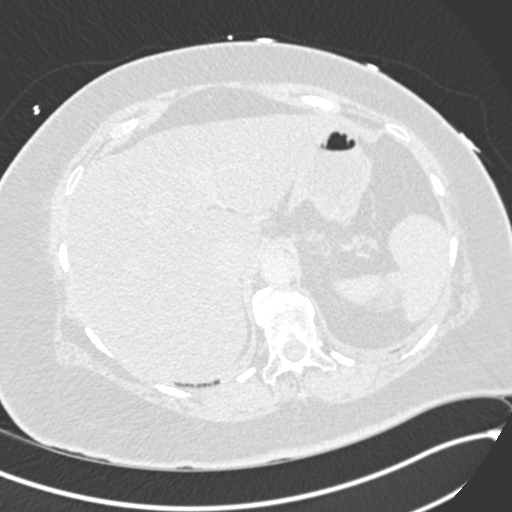
[im 64/295  soft-tissue]
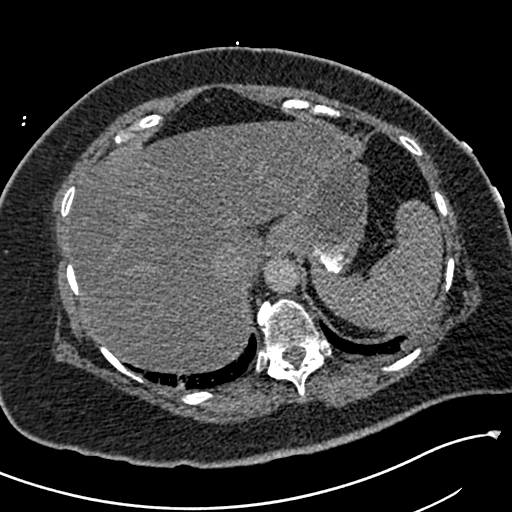
[im 90/295  lung]
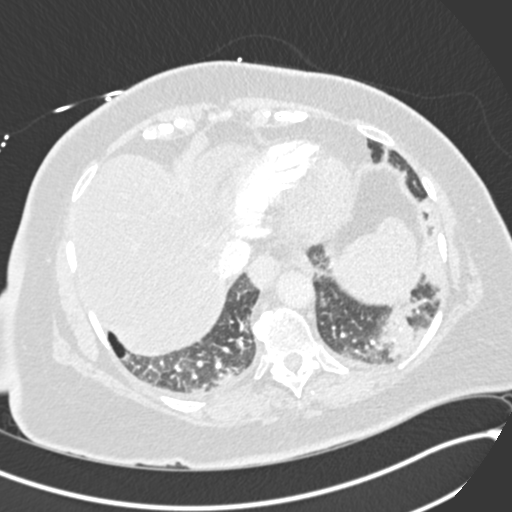
[im 103/295  soft-tissue]
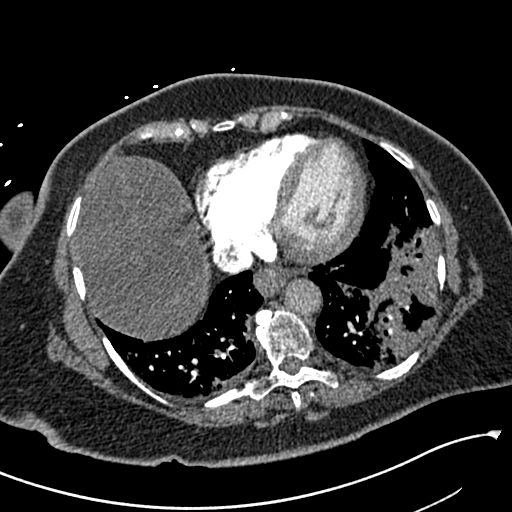
[im 116/295  lung]
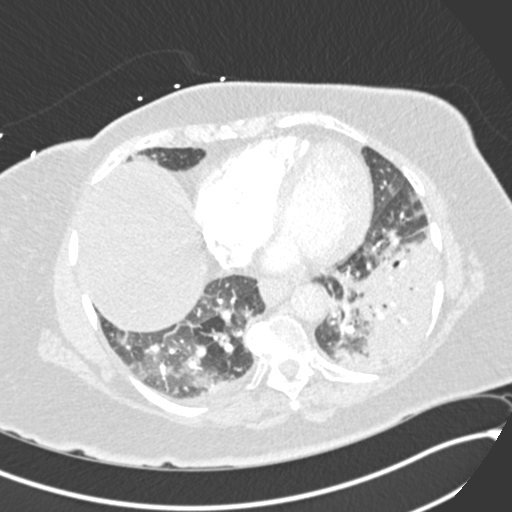
[im 141/295  soft-tissue]
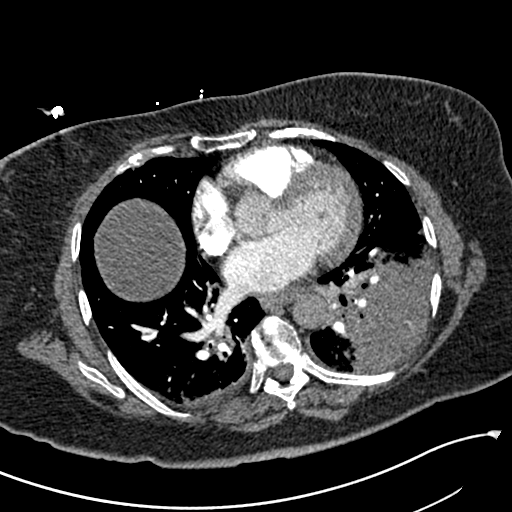
[im 154/295  lung]
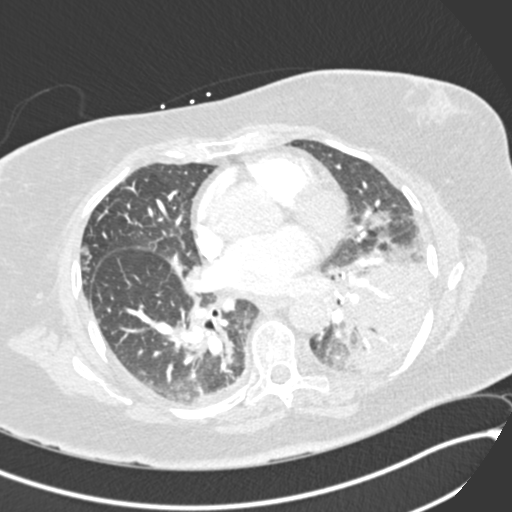
[im 179/295  soft-tissue]
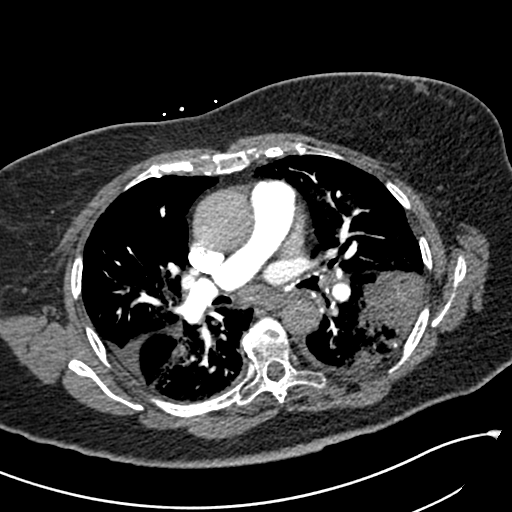
[im 192/295  lung]
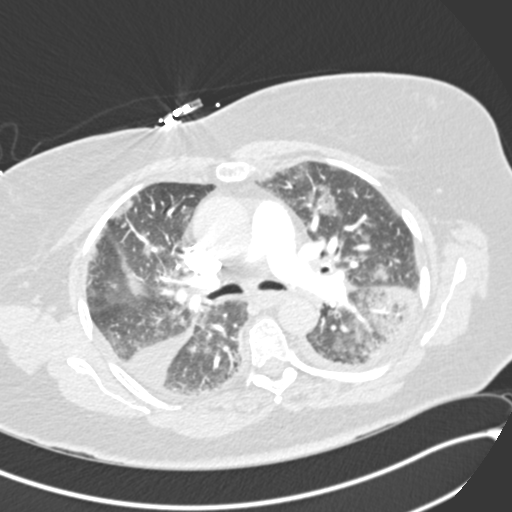
[im 205/295  soft-tissue]
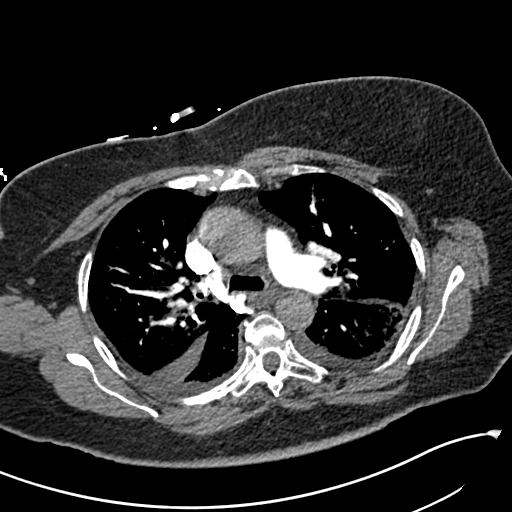
[im 231/295  lung]
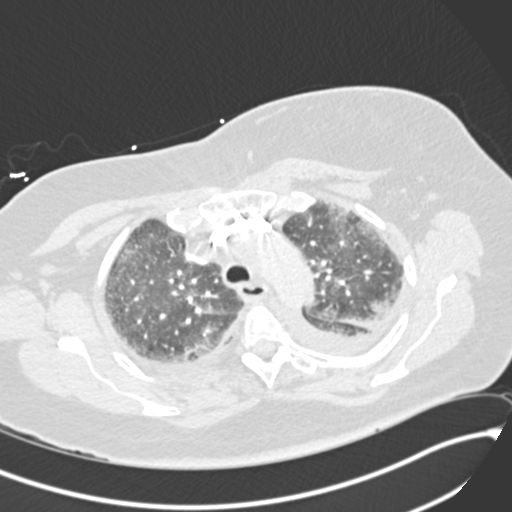
[im 243/295  soft-tissue]
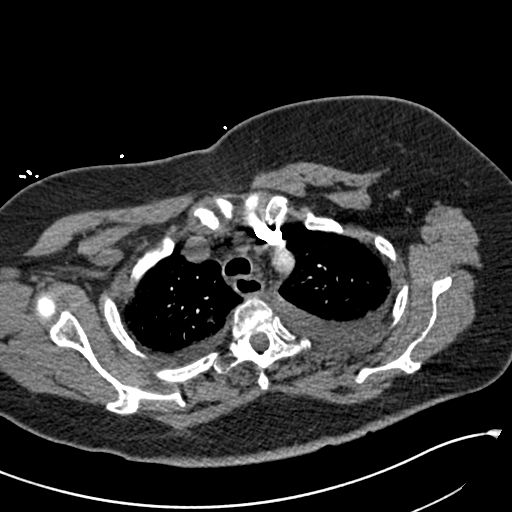
[im 256/295  lung]
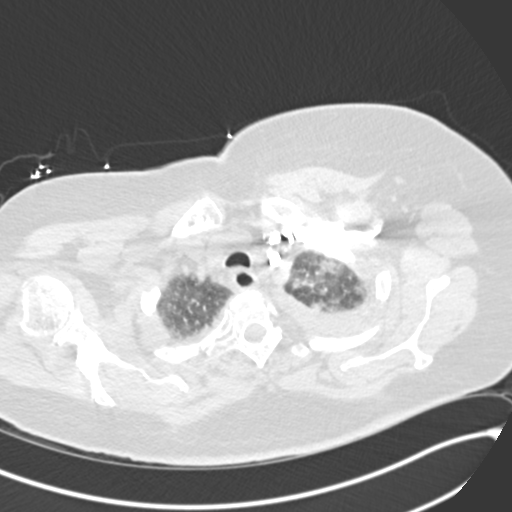
[im 282/295  soft-tissue]
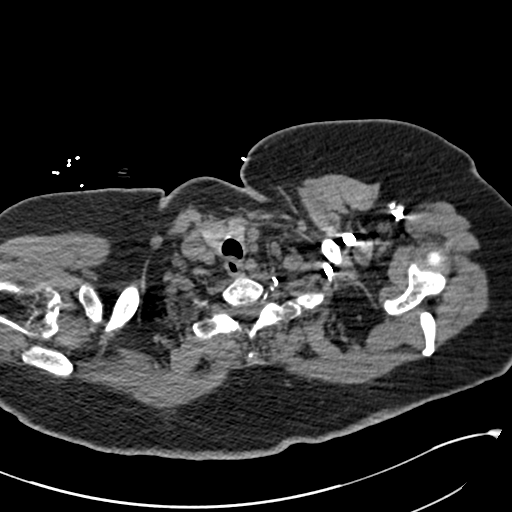

[Series 7: coronal mpr · coronal · 0.58mm/px · 2 of 87 slices shown]
[im 29/87  soft-tissue]
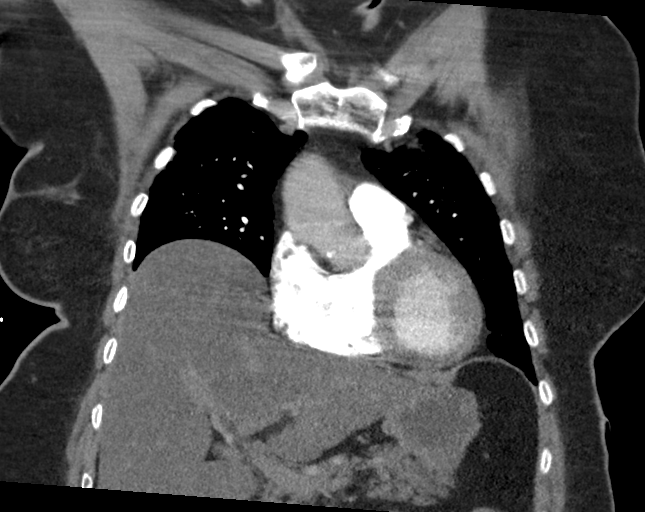
[im 58/87  soft-tissue]
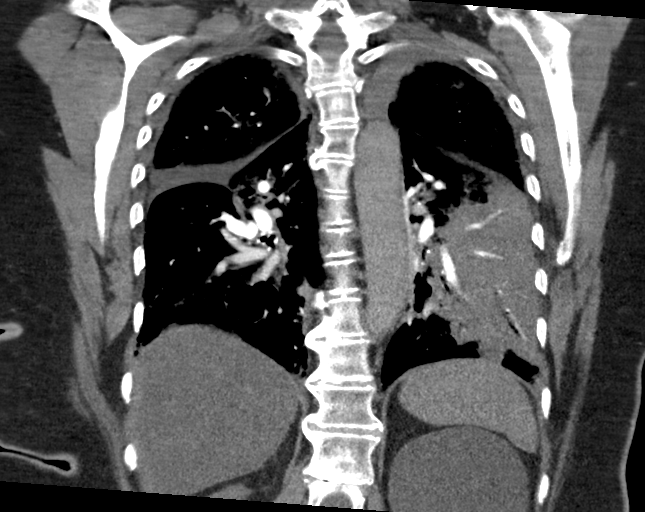

[18 of 46 positions shown; findings below may reference images not displayed]

FINDINGS: Cardiovascular: Good contrast bolus timing in the pulmonary arterial
tree.

No focal filling defect identified in the pulmonary arteries to
suggest acute pulmonary embolism.

No pericardial effusion.  Mild Calcified aortic atherosclerosis.

Mediastinum/Nodes: No mediastinal or hilar lymphadenopathy.

Lungs/Pleura: Subtotal consolidation of the left lower lobe. Small
superimposed left pleural effusion, partially tracking in the left
major fissure. Multifocal peripheral ground-glass and confluent
opacity in the left upper lobe. Mild peribronchial opacity in the
lingula. Right lung mostly peribronchial multifocal opacity, but
there is some peripheral opacity in the right upper lobe and middle
lobe. Superimposed small right pleural effusion, mostly within the
right major fissure. There is mild paraseptal emphysema in the right
costophrenic angle (series 6, image 65).

Upper Abdomen: Large simple fluid density exophytic left renal cyst
is partially visible. Hepatic steatosis. Cholelithiasis. Negative
visualized spleen, pancreas, adrenal glands, and bowel in the upper
abdomen.

Musculoskeletal: Mild mid thoracic vertebral body wedging appears to
be chronic. Degenerative thoracic endplate spurring. Osteopenia. No
acute osseous abnormality identified.

Review of the MIP images confirms the above findings.
IMPRESSION: 1.  No evidence of acute pulmonary embolus.
2. Bilateral multilobar pneumonia with severe left lower lobe
consolidation. Small bilateral pleural effusions.
3. Fatty liver disease.  Cholelithiasis.
4. Mild Calcified aortic atherosclerosis.

## 2018-03-21 IMAGING — DX DG CHEST 1V PORT
1 series · 1 of 1 positions shown · non-contrast
Comparison: September 12, 2016

CLINICAL DATA: Worsening shortness of breath

EXAM:
PORTABLE CHEST 1 VIEW

[chest ap]
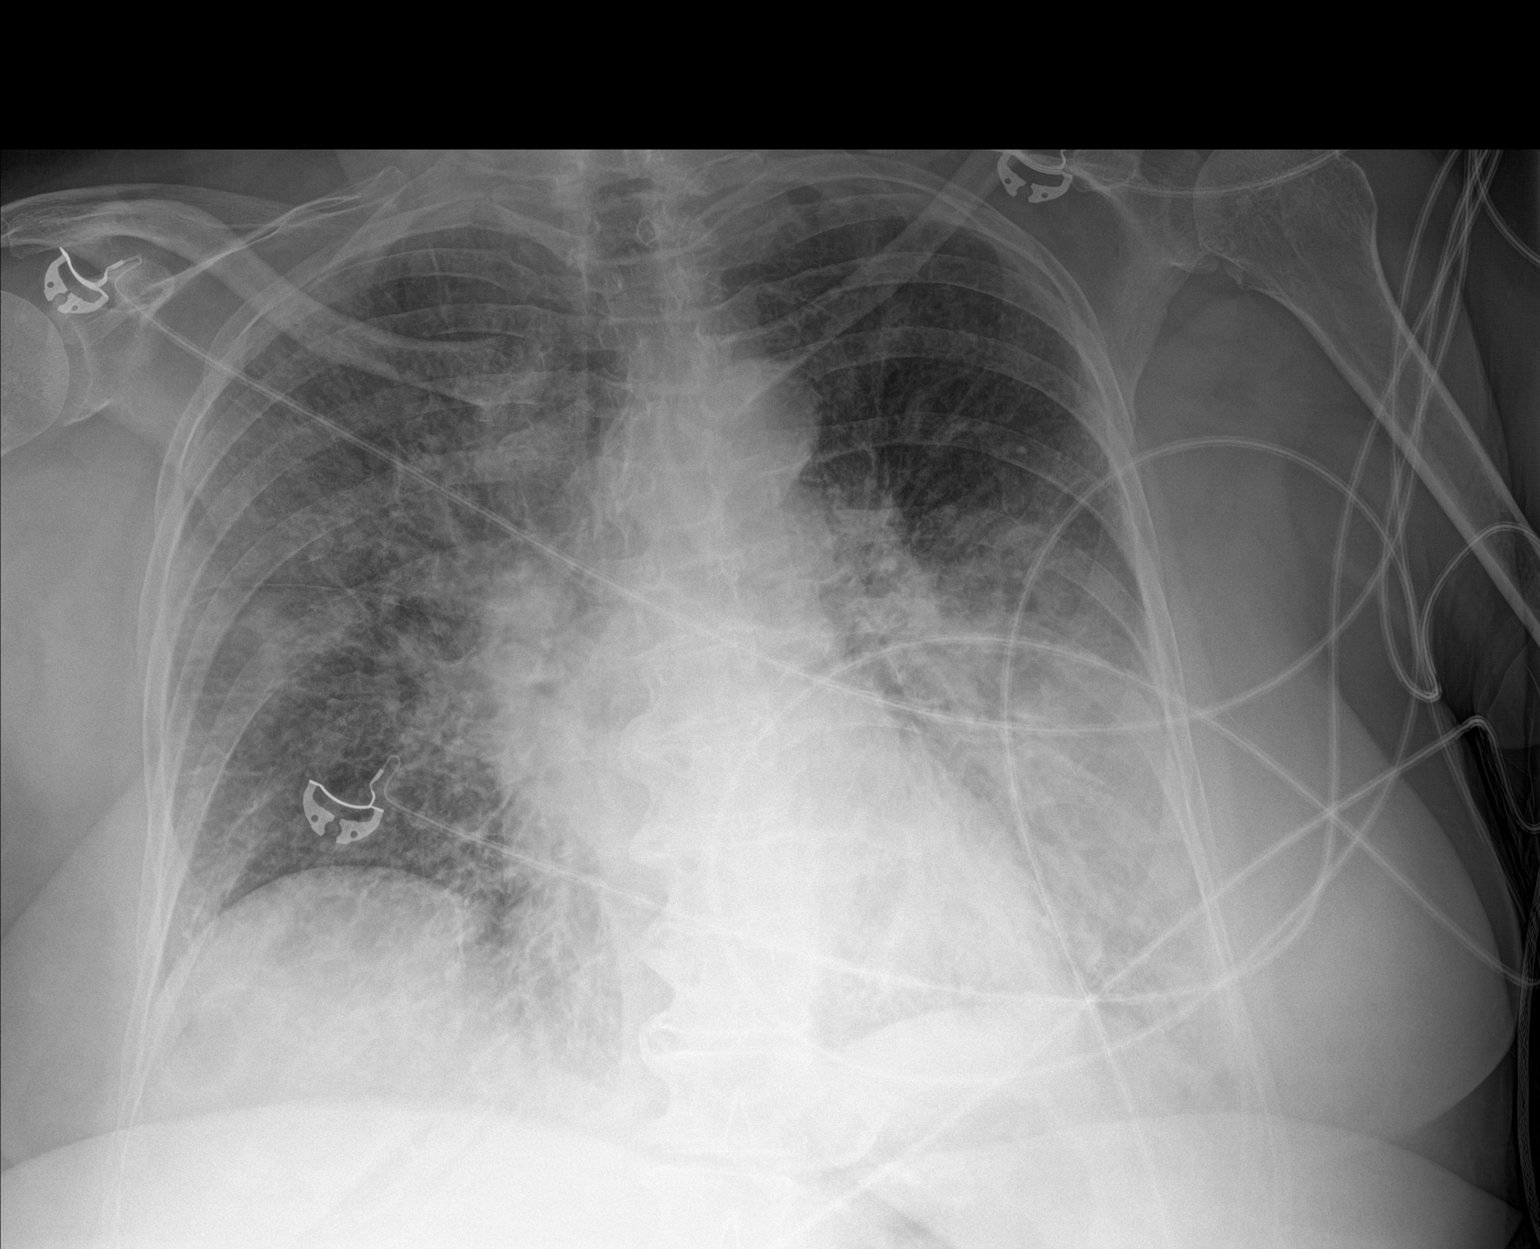

[1 of 1 positions shown; findings below may reference images not displayed]

FINDINGS: Focal infiltrate in the left lower lung is mildly worsened. No
pneumothorax. Increased diffuse patchy opacity on the right. No
other change.
IMPRESSION: The focal infiltrate in the left lower lobe is mildly worsened.
There is new patchy opacity on the right with an interstitial
component suspected. Whether this is developing multifocal pneumonia
or asymmetric edema is unclear. The patient has no cardiomegaly
however.

## 2018-03-22 IMAGING — DX DG CHEST 1V PORT
1 series · 2 of 2 positions shown · non-contrast
Comparison: 09/13/2016

CLINICAL DATA: Respiratory failure

EXAM:
PORTABLE CHEST 1 VIEW

[Series 1: chest ap · 0.14mm/px · 2 of 2 slices shown]
[im 1/2]
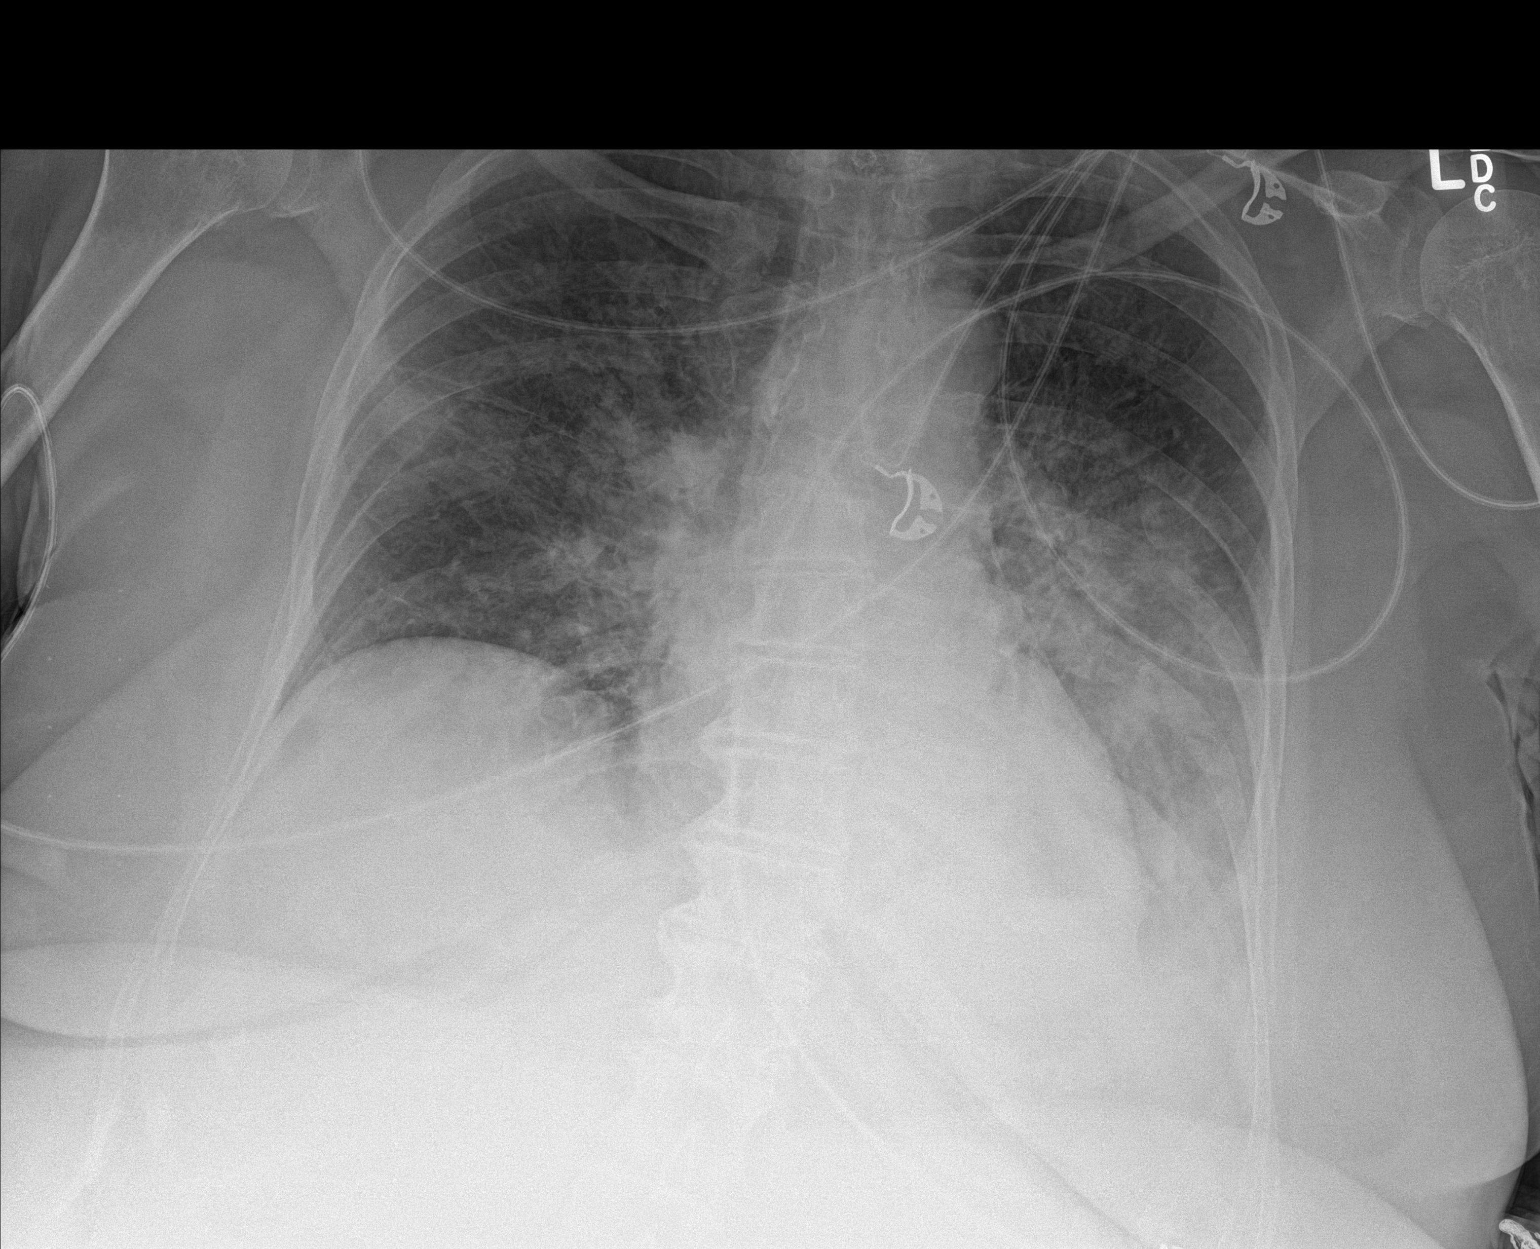
[im 2/2]
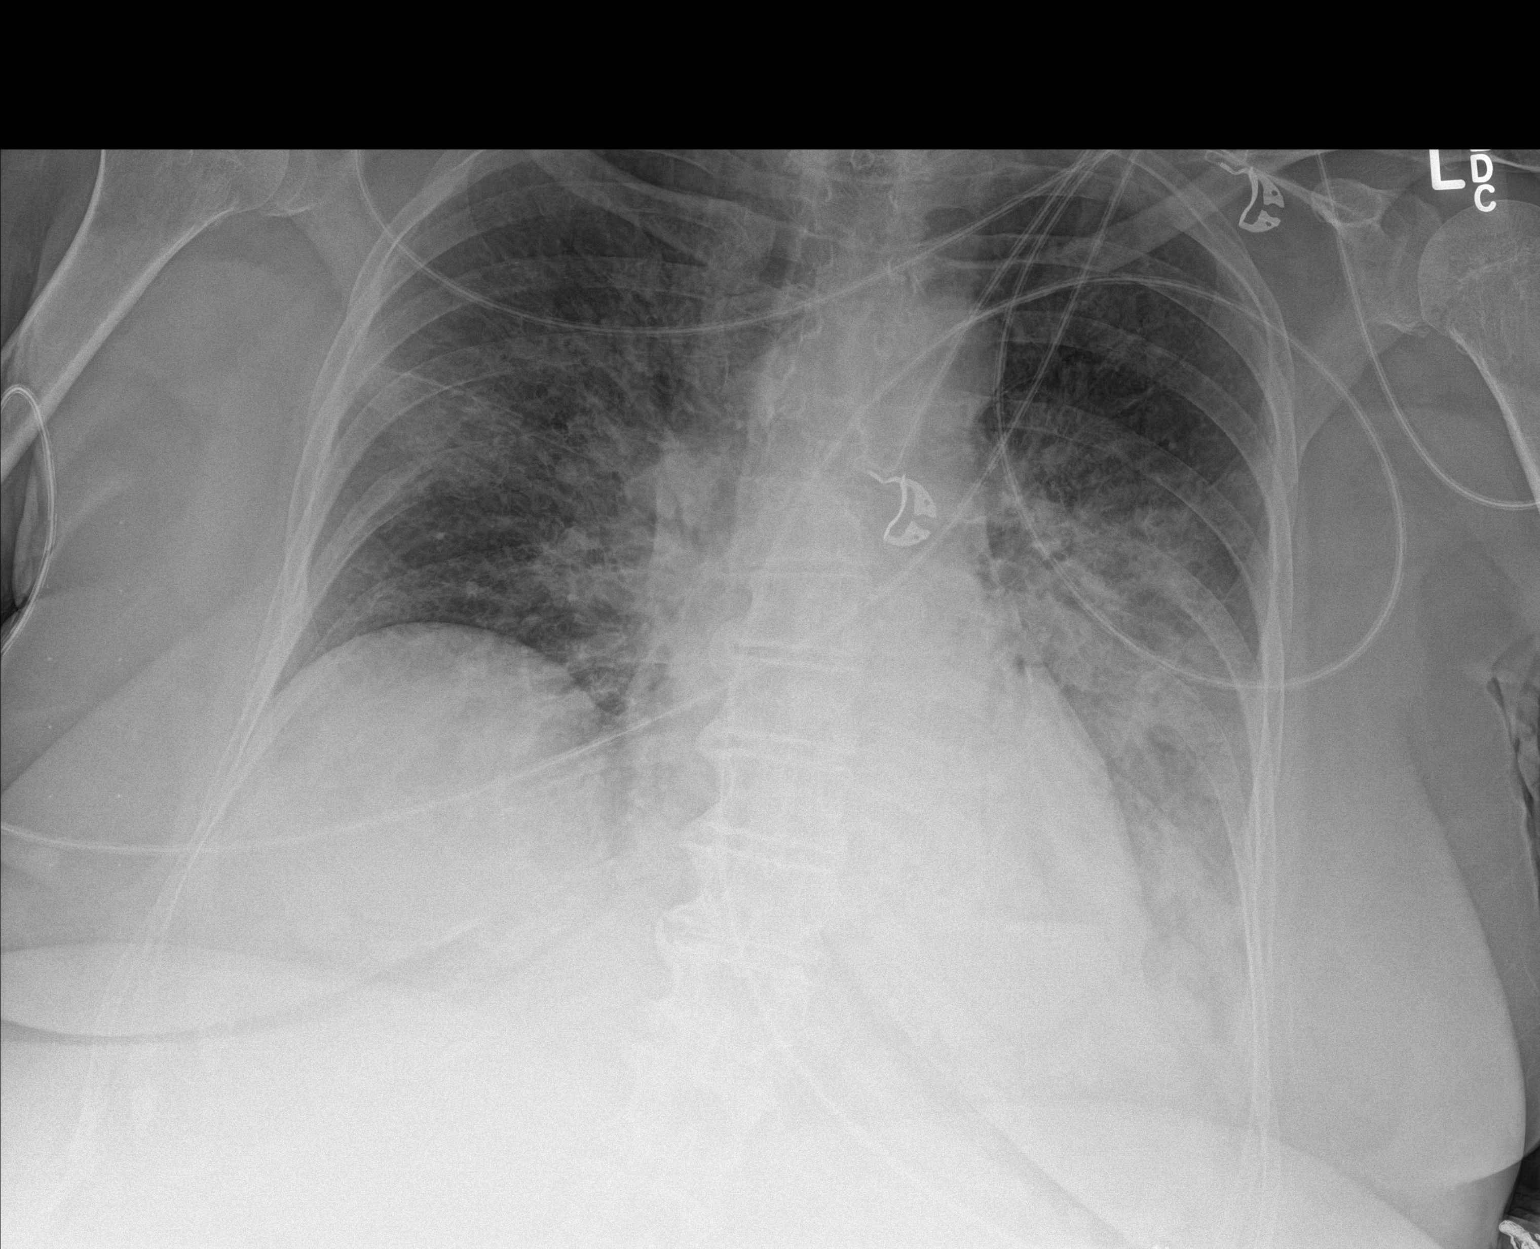

[2 of 2 positions shown; findings below may reference images not displayed]

FINDINGS: Normal heart size. There is partial improvement in pulmonary edema
pattern. There has been slightly improved aeration to the left
midlung and left base.
IMPRESSION: Decrease in pulmonary edema with mild improvement in aeration to the
left midlung and left base.

## 2018-03-23 IMAGING — DX DG CHEST 1V PORT
1 series · 1 of 1 positions shown · non-contrast
Comparison: September 14, 2016

CLINICAL DATA: Respiratory failure

EXAM:
PORTABLE CHEST 1 VIEW

[chest ap]
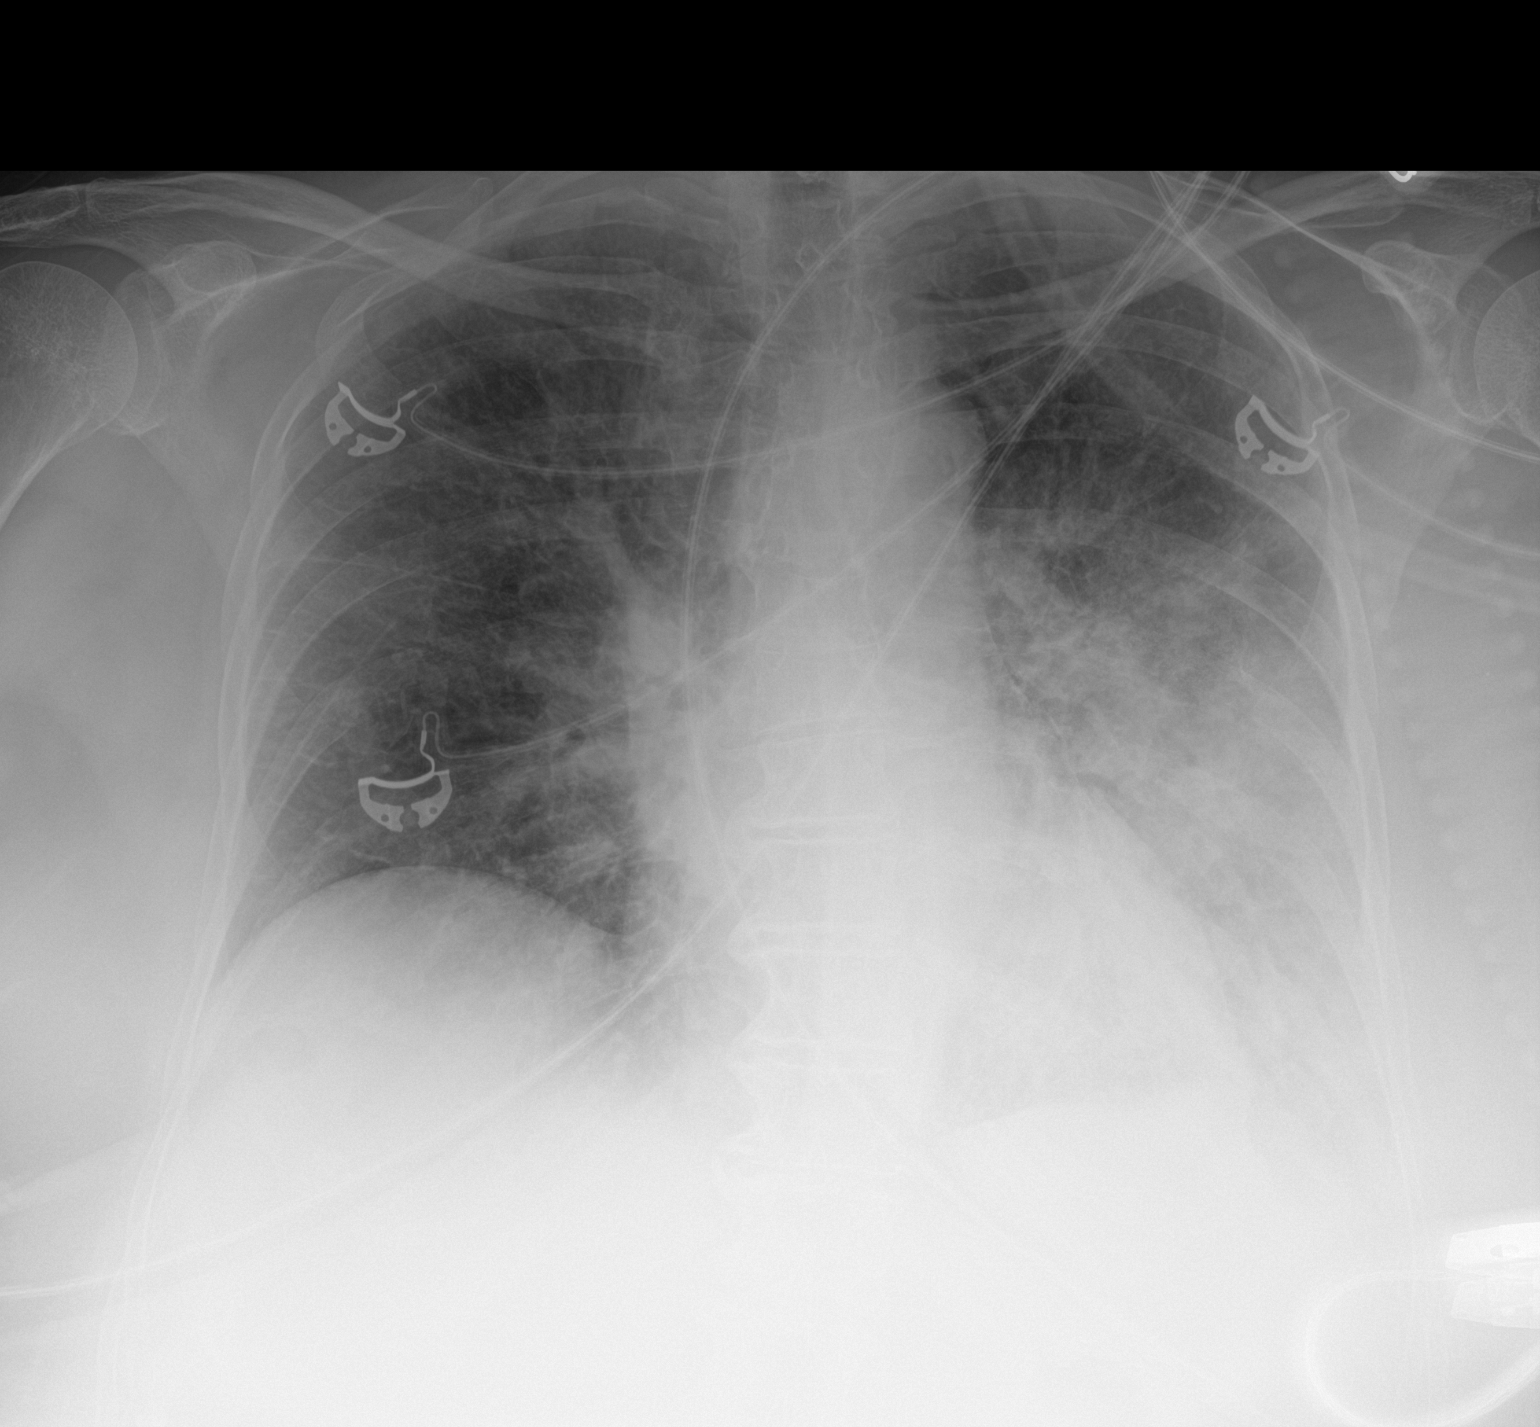

[1 of 1 positions shown; findings below may reference images not displayed]

FINDINGS: There is patchy airspace consolidation throughout the left mid lower
lung zones, essentially stable. There is slight atelectasis in the
right mid lung and right base. Right lung is otherwise clear. Heart
is upper normal in size with pulmonary venous hypertension. There is
aortic atherosclerosis. No adenopathy. No bone lesions.
IMPRESSION: Evidence of pulmonary vascular congestion. Probable pneumonia left
mid lower lung zones, essentially stable. Mild atelectasis right mid
lower lung zones. Aortic atherosclerosis.

## 2018-04-12 DIAGNOSIS — M9903 Segmental and somatic dysfunction of lumbar region: Secondary | ICD-10-CM | POA: Diagnosis not present

## 2018-04-12 DIAGNOSIS — M9902 Segmental and somatic dysfunction of thoracic region: Secondary | ICD-10-CM | POA: Diagnosis not present

## 2018-04-12 DIAGNOSIS — M62830 Muscle spasm of back: Secondary | ICD-10-CM | POA: Diagnosis not present

## 2018-04-12 DIAGNOSIS — M5136 Other intervertebral disc degeneration, lumbar region: Secondary | ICD-10-CM | POA: Diagnosis not present

## 2018-04-26 NOTE — Progress Notes (Signed)
Mammogram 2019

## 2018-04-27 ENCOUNTER — Encounter: Payer: Self-pay | Admitting: Obstetrics & Gynecology

## 2018-04-27 ENCOUNTER — Ambulatory Visit (INDEPENDENT_AMBULATORY_CARE_PROVIDER_SITE_OTHER): Payer: Medicare Other | Admitting: Obstetrics & Gynecology

## 2018-04-27 VITALS — BP 128/82 | HR 72 | Wt 219.4 lb

## 2018-04-27 DIAGNOSIS — I2510 Atherosclerotic heart disease of native coronary artery without angina pectoris: Secondary | ICD-10-CM | POA: Diagnosis not present

## 2018-04-27 DIAGNOSIS — N814 Uterovaginal prolapse, unspecified: Secondary | ICD-10-CM

## 2018-04-27 DIAGNOSIS — Z01419 Encounter for gynecological examination (general) (routine) without abnormal findings: Secondary | ICD-10-CM

## 2018-04-27 NOTE — Progress Notes (Signed)
Subjective:    Darlene Lawrence is a 77 y.o. married P2 (38 yo son and 30 yo son- 1 granddaughter 56 yo)  female who presents for an annual exam. The patient has no complaints today. The patient is sexually active. GYN screening history: last pap: was normal. The patient wears seatbelts: yes. The patient participates in regular exercise: no. Has the patient ever been transfused or tattooed?: no. The patient reports that there is not domestic violence in her life.   Menstrual History: OB History    Gravida  3   Para  2   Term  2   Preterm  0   AB  1   Living  2     SAB  1   TAB  0   Ectopic  0   Multiple  0   Live Births  2           Menarche age: 59 No LMP recorded. Patient is postmenopausal.    The following portions of the patient's history were reviewed and updated as appropriate: allergies, current medications, past family history, past medical history, past social history, past surgical history and problem list.  Review of Systems Pertinent items are noted in HPI.   LMP around age 52 Married for 97 years Mammogram UTD Colonoscopy due next year She got a flu vaccine this year FH- + breast cancer in her mother in her 81s and colon cancer in her 86s, lived to 77 years old, no gyn cancer   Objective:    BP 128/82   Pulse 72   Wt 219 lb 6.4 oz (99.5 kg)   BMI 37.08 kg/m   General Appearance:    Alert, cooperative, no distress, appears stated age  Head:    Normocephalic, without obvious abnormality, atraumatic  Eyes:    PERRL, conjunctiva/corneas clear, EOM's intact, fundi    benign, both eyes  Ears:    Normal TM's and external ear canals, both ears  Nose:   Nares normal, septum midline, mucosa normal, no drainage    or sinus tenderness  Throat:   Lips, mucosa, and tongue normal; teeth and gums normal  Neck:   Supple, symmetrical, trachea midline, no adenopathy;    thyroid:  no enlargement/tenderness/nodules; no carotid   bruit or JVD  Back:      Symmetric, no curvature, ROM normal, no CVA tenderness  Lungs:     Clear to auscultation bilaterally, respirations unlabored  Chest Wall:    No tenderness or deformity   Heart:    Regular rate and rhythm, S1 and S2 normal, no murmur, rub   or gallop  Breast Exam:    No tenderness, masses, or nipple abnormality  Abdomen:     Soft, non-tender, bowel sounds active all four quadrants,    no masses, no organomegaly  Genitalia:    Normal female without lesion, discharge or tenderness, atrophy and 4th degree cystocele noted, no masses or tenderness with bimanual exam, vaginal epithelium looks normal     Extremities:   Extremities normal, atraumatic, no cyanosis or edema  Pulses:   2+ and symmetric all extremities  Skin:   Skin color, texture, turgor normal, no rashes or lesions  Lymph nodes:   Cervical, supraclavicular, and axillary nodes normal  Neurologic:   CNII-XII intact, normal strength, sensation and reflexes    throughout  .    Assessment:    Healthy female exam.    Plan:     healthy lifestyle choices discussed

## 2018-05-10 DIAGNOSIS — M62830 Muscle spasm of back: Secondary | ICD-10-CM | POA: Diagnosis not present

## 2018-05-10 DIAGNOSIS — M9902 Segmental and somatic dysfunction of thoracic region: Secondary | ICD-10-CM | POA: Diagnosis not present

## 2018-05-10 DIAGNOSIS — M9903 Segmental and somatic dysfunction of lumbar region: Secondary | ICD-10-CM | POA: Diagnosis not present

## 2018-05-10 DIAGNOSIS — M5136 Other intervertebral disc degeneration, lumbar region: Secondary | ICD-10-CM | POA: Diagnosis not present

## 2018-06-08 DIAGNOSIS — M9903 Segmental and somatic dysfunction of lumbar region: Secondary | ICD-10-CM | POA: Diagnosis not present

## 2018-06-08 DIAGNOSIS — M9902 Segmental and somatic dysfunction of thoracic region: Secondary | ICD-10-CM | POA: Diagnosis not present

## 2018-06-08 DIAGNOSIS — M5136 Other intervertebral disc degeneration, lumbar region: Secondary | ICD-10-CM | POA: Diagnosis not present

## 2018-06-08 DIAGNOSIS — M62830 Muscle spasm of back: Secondary | ICD-10-CM | POA: Diagnosis not present

## 2018-07-05 DIAGNOSIS — M9903 Segmental and somatic dysfunction of lumbar region: Secondary | ICD-10-CM | POA: Diagnosis not present

## 2018-07-05 DIAGNOSIS — M9902 Segmental and somatic dysfunction of thoracic region: Secondary | ICD-10-CM | POA: Diagnosis not present

## 2018-07-05 DIAGNOSIS — M5136 Other intervertebral disc degeneration, lumbar region: Secondary | ICD-10-CM | POA: Diagnosis not present

## 2018-07-05 DIAGNOSIS — M62830 Muscle spasm of back: Secondary | ICD-10-CM | POA: Diagnosis not present

## 2018-07-28 ENCOUNTER — Other Ambulatory Visit: Payer: Self-pay | Admitting: Family Medicine

## 2018-07-28 DIAGNOSIS — Z1231 Encounter for screening mammogram for malignant neoplasm of breast: Secondary | ICD-10-CM

## 2018-08-02 DIAGNOSIS — M5136 Other intervertebral disc degeneration, lumbar region: Secondary | ICD-10-CM | POA: Diagnosis not present

## 2018-08-02 DIAGNOSIS — M9903 Segmental and somatic dysfunction of lumbar region: Secondary | ICD-10-CM | POA: Diagnosis not present

## 2018-08-02 DIAGNOSIS — M9902 Segmental and somatic dysfunction of thoracic region: Secondary | ICD-10-CM | POA: Diagnosis not present

## 2018-08-02 DIAGNOSIS — M62830 Muscle spasm of back: Secondary | ICD-10-CM | POA: Diagnosis not present

## 2018-08-30 DIAGNOSIS — M9903 Segmental and somatic dysfunction of lumbar region: Secondary | ICD-10-CM | POA: Diagnosis not present

## 2018-08-30 DIAGNOSIS — M62830 Muscle spasm of back: Secondary | ICD-10-CM | POA: Diagnosis not present

## 2018-08-30 DIAGNOSIS — M5136 Other intervertebral disc degeneration, lumbar region: Secondary | ICD-10-CM | POA: Diagnosis not present

## 2018-08-30 DIAGNOSIS — M9902 Segmental and somatic dysfunction of thoracic region: Secondary | ICD-10-CM | POA: Diagnosis not present

## 2018-09-06 ENCOUNTER — Ambulatory Visit: Payer: Medicare Other

## 2018-09-15 ENCOUNTER — Other Ambulatory Visit: Payer: Self-pay | Admitting: *Deleted

## 2018-09-15 NOTE — Patient Outreach (Signed)
[facility]) Care Management  09/15/2018  ANIESA BOBACK Aug 28, 1940 [id number]  Late entry: [place] CM case closure/ documentation only   Patient designated not active with [facility] CM;  [facility] CM previous cases re-closed today as instructed  Oneta Rack, RN, BSN, [place] Coordinator [facility] Care Management  [phone]

## 2018-09-27 DIAGNOSIS — M5136 Other intervertebral disc degeneration, lumbar region: Secondary | ICD-10-CM | POA: Diagnosis not present

## 2018-09-27 DIAGNOSIS — M9902 Segmental and somatic dysfunction of thoracic region: Secondary | ICD-10-CM | POA: Diagnosis not present

## 2018-09-27 DIAGNOSIS — M62830 Muscle spasm of back: Secondary | ICD-10-CM | POA: Diagnosis not present

## 2018-09-27 DIAGNOSIS — M9903 Segmental and somatic dysfunction of lumbar region: Secondary | ICD-10-CM | POA: Diagnosis not present

## 2018-10-25 DIAGNOSIS — M62830 Muscle spasm of back: Secondary | ICD-10-CM | POA: Diagnosis not present

## 2018-10-25 DIAGNOSIS — M9902 Segmental and somatic dysfunction of thoracic region: Secondary | ICD-10-CM | POA: Diagnosis not present

## 2018-10-25 DIAGNOSIS — M5136 Other intervertebral disc degeneration, lumbar region: Secondary | ICD-10-CM | POA: Diagnosis not present

## 2018-10-25 DIAGNOSIS — M9903 Segmental and somatic dysfunction of lumbar region: Secondary | ICD-10-CM | POA: Diagnosis not present

## 2018-10-26 ENCOUNTER — Ambulatory Visit
Admission: RE | Admit: 2018-10-26 | Discharge: 2018-10-26 | Disposition: A | Discharge status: Home / Self Care | Payer: Medicare Other | Source: Ambulatory Visit | Attending: Family Medicine | Admitting: Family Medicine

## 2018-10-26 ENCOUNTER — Other Ambulatory Visit: Payer: Self-pay

## 2018-10-26 DIAGNOSIS — Z1231 Encounter for screening mammogram for malignant neoplasm of breast: Secondary | ICD-10-CM

## 2018-11-22 DIAGNOSIS — M9902 Segmental and somatic dysfunction of thoracic region: Secondary | ICD-10-CM | POA: Diagnosis not present

## 2018-11-22 DIAGNOSIS — M5136 Other intervertebral disc degeneration, lumbar region: Secondary | ICD-10-CM | POA: Diagnosis not present

## 2018-11-22 DIAGNOSIS — M9903 Segmental and somatic dysfunction of lumbar region: Secondary | ICD-10-CM | POA: Diagnosis not present

## 2018-11-22 DIAGNOSIS — M62830 Muscle spasm of back: Secondary | ICD-10-CM | POA: Diagnosis not present

## 2018-11-24 ENCOUNTER — Telehealth: Payer: Self-pay | Admitting: Family Medicine

## 2018-11-24 ENCOUNTER — Other Ambulatory Visit (INDEPENDENT_AMBULATORY_CARE_PROVIDER_SITE_OTHER): Payer: Medicare Other

## 2018-11-24 ENCOUNTER — Ambulatory Visit: Payer: Medicare Other

## 2018-11-24 DIAGNOSIS — E782 Mixed hyperlipidemia: Secondary | ICD-10-CM | POA: Diagnosis not present

## 2018-11-24 LAB — COMPREHENSIVE METABOLIC PANEL
ALT: 26 U/L (ref 0–35)
AST: 22 U/L (ref 0–37)
Albumin: 4 g/dL (ref 3.5–5.2)
Alkaline Phosphatase: 61 U/L (ref 39–117)
BUN: 29 mg/dL — ABNORMAL HIGH (ref 6–23)
CO2: 27 mEq/L (ref 19–32)
Calcium: 9 mg/dL (ref 8.4–10.5)
Chloride: 103 mEq/L (ref 96–112)
Creatinine, Ser: 1.18 mg/dL (ref 0.40–1.20)
GFR: 44.52 mL/min — ABNORMAL LOW (ref >60.00)
Glucose, Bld: 102 mg/dL — ABNORMAL HIGH (ref 70–99)
Potassium: 4 mEq/L (ref 3.5–5.1)
Sodium: 139 mEq/L (ref 135–145)
Total Bilirubin: 0.5 mg/dL (ref 0.2–1.2)
Total Protein: 7.4 g/dL (ref 6.0–8.3)

## 2018-11-24 LAB — LIPID PANEL
Cholesterol: 165 mg/dL (ref 0–200)
HDL: 42.4 mg/dL (ref >39.00)
LDL Cholesterol: 92 mg/dL (ref 0–99)
NonHDL: 122.52
Total CHOL/HDL Ratio: 4
Triglycerides: 155 mg/dL — ABNORMAL HIGH (ref 0.0–149.0)
VLDL: 31 mg/dL (ref 0.0–40.0)

## 2018-11-24 NOTE — Telephone Encounter (Signed)
lab

## 2018-11-25 DIAGNOSIS — M5136 Other intervertebral disc degeneration, lumbar region: Secondary | ICD-10-CM | POA: Diagnosis not present

## 2018-11-25 DIAGNOSIS — M62830 Muscle spasm of back: Secondary | ICD-10-CM | POA: Diagnosis not present

## 2018-11-25 DIAGNOSIS — M9902 Segmental and somatic dysfunction of thoracic region: Secondary | ICD-10-CM | POA: Diagnosis not present

## 2018-11-25 DIAGNOSIS — M9903 Segmental and somatic dysfunction of lumbar region: Secondary | ICD-10-CM | POA: Diagnosis not present

## 2018-11-30 ENCOUNTER — Encounter: Payer: Self-pay | Admitting: Family Medicine

## 2018-11-30 ENCOUNTER — Other Ambulatory Visit: Payer: Self-pay

## 2018-11-30 ENCOUNTER — Ambulatory Visit (INDEPENDENT_AMBULATORY_CARE_PROVIDER_SITE_OTHER): Payer: Medicare Other | Admitting: Family Medicine

## 2018-11-30 VITALS — BP 124/68 | HR 58 | Temp 98.4°F | Ht 64.5 in | Wt 222.2 lb

## 2018-11-30 DIAGNOSIS — Z0000 Encounter for general adult medical examination without abnormal findings: Secondary | ICD-10-CM

## 2018-11-30 DIAGNOSIS — N183 Chronic kidney disease, stage 3 unspecified: Secondary | ICD-10-CM

## 2018-11-30 DIAGNOSIS — D696 Thrombocytopenia, unspecified: Secondary | ICD-10-CM

## 2018-11-30 DIAGNOSIS — I5033 Acute on chronic diastolic (congestive) heart failure: Secondary | ICD-10-CM | POA: Diagnosis not present

## 2018-11-30 DIAGNOSIS — E782 Mixed hyperlipidemia: Secondary | ICD-10-CM

## 2018-11-30 DIAGNOSIS — I10 Essential (primary) hypertension: Secondary | ICD-10-CM | POA: Diagnosis not present

## 2018-11-30 MED ORDER — FUROSEMIDE 20 MG PO TABS: 20.0000 mg | ORAL_TABLET | Freq: Every day | ORAL | 3 refills | Status: DC

## 2018-11-30 NOTE — Assessment & Plan Note (Signed)
Using lasix for edema. Balance with CKD. Not requiring potassium supplement.

## 2018-11-30 NOTE — Patient Instructions (Addendum)
  Darlene Lawrence , Thank you for taking time to come for your Medicare Wellness Visit. I appreciate your ongoing commitment to your health goals. Please review the following plan we discussed and let me know if I can assist you in the future.   These are the goals we discussed:  Increase exercise as able during the pandemic.   This is a list of the screening recommended for you and due dates:  Health Maintenance  Topic Date Due  . Colon Cancer Screening  10/11/2018  . Flu Shot  12/11/2018  . Tetanus Vaccine  11/10/2021  . DEXA scan (bone density measurement)  Completed  . Pneumonia vaccines  Completed

## 2018-11-30 NOTE — Assessment & Plan Note (Signed)
Well controlled. Continue current medication.  

## 2018-11-30 NOTE — Assessment & Plan Note (Signed)
Stable or resolve din past years. No easy bruising.

## 2018-11-30 NOTE — Assessment & Plan Note (Signed)
LDL tolerable on crestor 10 mg  2 times a week. DOES NOT HAVE CAD.

## 2018-11-30 NOTE — Progress Notes (Signed)
Chief Complaint  Patient presents with  . Medicare Wellness    History of Present Illness: HPI The patient presents for annual medicare wellness, complete physical and review of chronic health problems. He/She also has the following acute concerns today:none  I have personally reviewed the Medicare Annual Wellness questionnaire and have noted 1. The patient's medical and social history 2. Their use of alcohol, tobacco or illicit drugs 3. Their current medications and supplements 4. The patient's functional ability including ADL's, fall risks, home safety risks and hearing or visual             impairment. 5. Diet and physical activities 6. Evidence for depression or mood disorders 7.         Updated provider list Cognitive evaluation was performed and recorded on pt medicare questionnaire form. The patients weight, height, BMI and visual acuity have been recorded in the chart  I have made referrals, counseling and provided education to the patient based review of the above and I have provided the pt with a written personalized care plan for preventive services.   Documentation of this information was scanned into the electronic record under the media tab.   Advance directives and end of life planning reviewed in detail with patient and documented in EMR. Patient given handout on advance care directives if needed. HCPOA and living will updated if needed.  Fall Risk  11/30/2018 11/06/2017 12/08/2016 11/07/2016 11/06/2016  Falls in the past year? 0 No (No Data) (No Data) No  Comment - - No new falls reported today No new falls reported today -  Risk for fall due to : - - - - -  Risk for fall due to: Comment - - - - -   Depression screen [facility] 2/9 11/30/2018 11/06/2017 12/25/2016 11/06/2016 11/03/2016  Decreased Interest 0 0 0 0 0  Down, Depressed, Hopeless 0 0 0 0 0  PHQ - 2 Score 0 0 0 0 0  Altered sleeping - 0 0 - -  Tired, decreased energy - 0 2 - -  Change in appetite - 0 0 - -  Feeling  bad or failure about yourself  - 0 0 - -  Trouble concentrating - 0 0 - -  Moving slowly or fidgety/restless - 0 0 - -  Suicidal thoughts - 0 0 - -  PHQ-9 Score - 0 2 - -  Difficult doing work/chores - Not difficult at all Somewhat difficult - -    Hearing Screening   125Hz  250Hz  500Hz  1000Hz  2000Hz  3000Hz  4000Hz  6000Hz  8000Hz   Right ear:   20 20 20  20     Left ear:   25 20 25  25     Vision Screening Comments: Last eye exam, 12/2017  Hypertension:  Stable control  on no med.  Was off lasix 20 mg daily  For 8 months but  Swelling has returned in last month. BP Readings from Last 3 Encounters:  11/30/18 124/68  04/27/18 128/82  11/13/17 124/70  Using medication without problems or lightheadedness: none Chest pain with exertion:none Edema:  None now back on lasix Short of breath: Average home BPs: Other issues:  Elevated Cholesterol: LDL tolerable on crestor 10 mg  2 times a week. DOES NOT HAVE CAD. Lab Results  Component Value Date   CHOL 165 11/24/2018   HDL 42.40 11/24/2018   LDLCALC 92 11/24/2018   TRIG 155.0 (H) 11/24/2018   CHOLHDL 4 11/24/2018  Using medications without problems: Muscle aches:  Diet compliance:  moderate Exercise:none Other complaints: CAD, hx of NSTEMI  Due to demand ischemia with PNA (normal coronaries), CHF with preserved EF: Followed by Cards, Dr. Ubaldo Glassing asa 81 mg  CKD: on lasix.Marland Kitchen slight drop to 44 bleow baseline 48-56 Estimated Creatinine Clearance: 47.3 mL/min (by C-G formula based on SCr of 1.18 mg/dL).  Patient Care Team: Jinny Sanders, MD as PCP - General Nicholas Lose, MD as Consulting Physician (Hematology and Oncology) Gaynelle Arabian, MD as Consulting Physician (Orthopedic Surgery) Emily Filbert, MD as Consulting Physician (Obstetrics and Gynecology) Joan Flores Richard Miu, DO as Referring Physician (Optometry) Alisa Graff, FNP as Nurse Practitioner (Family Medicine) Teodoro Spray, MD as Consulting Physician (Cardiology)   COVID 19  screen No recent travel or known exposure to [place] The patient denies respiratory symptoms of COVID 19 at this time.  The importance of social distancing was discussed today.   Review of Systems  Constitutional: Negative for chills and fever.  HENT: Negative for congestion and ear pain.   Eyes: Negative for pain and redness.  Respiratory: Negative for cough and shortness of breath.   Cardiovascular: Negative for chest pain, palpitations and leg swelling.  Gastrointestinal: Negative for abdominal pain, blood in stool, constipation, diarrhea, nausea and vomiting.  Genitourinary: Negative for dysuria.  Musculoskeletal: Negative for falls and myalgias.  Skin: Negative for rash.  Neurological: Negative for dizziness.  Psychiatric/Behavioral: Negative for depression. The patient is not nervous/anxious.       Past Medical History:  Diagnosis Date  . Abnormal Pap smear   . Arthritis   . breast cancer 2009   right  . Cancer ([place])   . CHF (congestive heart failure) ([place])   . DDD (degenerative disc disease), lumbar   . High cholesterol   . Hyperlipidemia   . Hypertension   . Meniscus tear    right  . Personal history of radiation therapy   . RBBB     reports that she has never smoked. She has never used smokeless tobacco. She reports current alcohol use. She reports that she does not use drugs.   Current Outpatient Medications:  .  Aspirin (ASPIR-81 PO), Take 1 tablet by mouth., Disp: , Rfl:  .  b complex vitamins tablet, Take 1 tablet by mouth daily., Disp: , Rfl:  .  Calcium Citrate-Vitamin D (CALCIUM CITRATE + PO), Take 1 tablet by mouth daily., Disp: , Rfl:  .  Cholecalciferol (VITAMIN D3) 2000 UNITS TABS, Take 1 capsule by mouth 2 (two) times daily., Disp: , Rfl:  .  clindamycin (CLEOCIN) 150 MG capsule, Take 150 mg by mouth as needed. Takes 4 tablets before a dental procedure, Disp: , Rfl:  .  Coenzyme Q10 (COQ10) 100 MG CAPS, Take 1 capsule by mouth daily., Disp: , Rfl:  .   furosemide (LASIX) 20 MG tablet, Take 20 mg by mouth daily., Disp: , Rfl:  .  Misc Natural Products (GLUCOSAMINE CHOND MSM FORMULA PO), Take 1,500 mg by mouth., Disp: , Rfl:  .  Multiple Vitamin (MULTI VITAMIN DAILY PO), Take 1 tablet by mouth., Disp: , Rfl:  .  Omega-3 Fatty Acids (FISH OIL) 1200 MG CAPS, Take 1 capsule by mouth., Disp: , Rfl:  .  rosuvastatin (CRESTOR) 10 MG tablet, Take 10 mg by mouth 2 (two) times a week. Monday, Thursday., Disp: , Rfl:    Observations/Objective: Blood pressure 124/68, pulse (!) 58, temperature 98.4 F (36.9 C), temperature source Temporal, height 5' 4.5" (1.638 m), weight 222 lb 4 oz (  100.8 kg), SpO2 96 %.  Physical Exam Constitutional:      General: She is not in acute distress.    Appearance: Normal appearance. She is well-developed. She is not ill-appearing or toxic-appearing.  HENT:     Head: Normocephalic.     Right Ear: Hearing, tympanic membrane, ear canal and external ear normal.     Left Ear: Hearing, tympanic membrane, ear canal and external ear normal.     Nose: Nose normal.  Eyes:     General: Lids are normal. Lids are everted, no foreign bodies appreciated.     Conjunctiva/sclera: Conjunctivae normal.     Pupils: Pupils are equal, round, and reactive to light.  Neck:     Musculoskeletal: Normal range of motion and neck supple.     Thyroid: No thyroid mass or thyromegaly.     Vascular: No carotid bruit.     Trachea: Trachea normal.  Cardiovascular:     Rate and Rhythm: Normal rate and regular rhythm.     Heart sounds: Normal heart sounds, S1 normal and S2 normal. No murmur. No gallop.   Pulmonary:     Effort: Pulmonary effort is normal. No respiratory distress.     Breath sounds: Normal breath sounds. No wheezing, rhonchi or rales.  Abdominal:     General: Bowel sounds are normal. There is no distension or abdominal bruit.     Palpations: Abdomen is soft. There is no fluid wave or mass.     Tenderness: There is no abdominal  tenderness. There is no guarding or rebound.     Hernia: No hernia is present.  Lymphadenopathy:     Cervical: No cervical adenopathy.  Skin:    General: Skin is warm and dry.     Findings: No rash.  Neurological:     Mental Status: She is alert.     Cranial Nerves: No cranial nerve deficit.     Sensory: No sensory deficit.  Psychiatric:        Mood and Affect: Mood is not anxious or depressed.        Speech: Speech normal.        Behavior: Behavior normal. Behavior is cooperative.        Judgment: Judgment normal.      Assessment and Plan   The patient's preventative maintenance and recommended screening tests for an annual wellness exam were reviewed in full today. Brought up to date unless services declined.  Counselled on the importance of diet, exercise, and its role in overall health and mortality. The patient's FH and SH was reviewed, including their home life, tobacco status, and drug and alcohol status.   Vaccines: Uptodate . PAP/DVE: Per Dr. Hulan Fray. Following uterine prolapse. Decided to not move ahead with hysterectomy given SE risks. Breast exam/mammogram:  10/2018 nml, hx of breast cancer 2009 Colonoscopy: Dr. Cristina Gong 10/2013 polyps and fmaily history of colon cancer, repeat in 5 years. Nonsmoker  DEXA:osteopeniain 2016, repeat in2-5 years.  Essential hypertension Well controlled. Continue current medication.   Hyperlipidemia LDL tolerable on crestor 10 mg  2 times a week. DOES NOT HAVE CAD.  Thrombocytopenia, unspecified Stable or resolve din past years. No easy bruising.  Heart failure with preserved ejection fraction (HCC) Using lasix for edema. Balance with CKD. Not requiring potassium supplement.    Eliezer Lofts, MD

## 2018-12-20 DIAGNOSIS — M5136 Other intervertebral disc degeneration, lumbar region: Secondary | ICD-10-CM | POA: Diagnosis not present

## 2018-12-20 DIAGNOSIS — M9903 Segmental and somatic dysfunction of lumbar region: Secondary | ICD-10-CM | POA: Diagnosis not present

## 2018-12-20 DIAGNOSIS — M62830 Muscle spasm of back: Secondary | ICD-10-CM | POA: Diagnosis not present

## 2018-12-20 DIAGNOSIS — M9902 Segmental and somatic dysfunction of thoracic region: Secondary | ICD-10-CM | POA: Diagnosis not present

## 2019-01-19 DIAGNOSIS — M62830 Muscle spasm of back: Secondary | ICD-10-CM | POA: Diagnosis not present

## 2019-01-19 DIAGNOSIS — M9903 Segmental and somatic dysfunction of lumbar region: Secondary | ICD-10-CM | POA: Diagnosis not present

## 2019-01-19 DIAGNOSIS — M5136 Other intervertebral disc degeneration, lumbar region: Secondary | ICD-10-CM | POA: Diagnosis not present

## 2019-01-19 DIAGNOSIS — M9902 Segmental and somatic dysfunction of thoracic region: Secondary | ICD-10-CM | POA: Diagnosis not present

## 2019-01-31 DIAGNOSIS — H1031 Unspecified acute conjunctivitis, right eye: Secondary | ICD-10-CM | POA: Diagnosis not present

## 2019-02-14 DIAGNOSIS — M9902 Segmental and somatic dysfunction of thoracic region: Secondary | ICD-10-CM | POA: Diagnosis not present

## 2019-02-14 DIAGNOSIS — M62830 Muscle spasm of back: Secondary | ICD-10-CM | POA: Diagnosis not present

## 2019-02-14 DIAGNOSIS — M9903 Segmental and somatic dysfunction of lumbar region: Secondary | ICD-10-CM | POA: Diagnosis not present

## 2019-02-14 DIAGNOSIS — M5136 Other intervertebral disc degeneration, lumbar region: Secondary | ICD-10-CM | POA: Diagnosis not present

## 2019-02-15 DIAGNOSIS — Z23 Encounter for immunization: Secondary | ICD-10-CM | POA: Diagnosis not present

## 2019-02-23 DIAGNOSIS — R198 Other specified symptoms and signs involving the digestive system and abdomen: Secondary | ICD-10-CM | POA: Diagnosis not present

## 2019-02-23 DIAGNOSIS — Z800 Family history of malignant neoplasm of digestive organs: Secondary | ICD-10-CM | POA: Diagnosis not present

## 2019-02-23 DIAGNOSIS — Z86010 Personal history of colonic polyps: Secondary | ICD-10-CM | POA: Diagnosis not present

## 2019-03-01 ENCOUNTER — Encounter: Payer: Self-pay | Admitting: Radiology

## 2019-03-03 DIAGNOSIS — S4981XA Other specified injuries of right shoulder and upper arm, initial encounter: Secondary | ICD-10-CM | POA: Diagnosis not present

## 2019-03-03 DIAGNOSIS — M25511 Pain in right shoulder: Secondary | ICD-10-CM | POA: Diagnosis not present

## 2019-03-03 DIAGNOSIS — S0033XA Contusion of nose, initial encounter: Secondary | ICD-10-CM | POA: Diagnosis not present

## 2019-03-09 DIAGNOSIS — M5136 Other intervertebral disc degeneration, lumbar region: Secondary | ICD-10-CM | POA: Diagnosis not present

## 2019-03-09 DIAGNOSIS — M9903 Segmental and somatic dysfunction of lumbar region: Secondary | ICD-10-CM | POA: Diagnosis not present

## 2019-03-09 DIAGNOSIS — M9902 Segmental and somatic dysfunction of thoracic region: Secondary | ICD-10-CM | POA: Diagnosis not present

## 2019-03-09 DIAGNOSIS — M62830 Muscle spasm of back: Secondary | ICD-10-CM | POA: Diagnosis not present

## 2019-03-14 DIAGNOSIS — M9902 Segmental and somatic dysfunction of thoracic region: Secondary | ICD-10-CM | POA: Diagnosis not present

## 2019-03-14 DIAGNOSIS — M9903 Segmental and somatic dysfunction of lumbar region: Secondary | ICD-10-CM | POA: Diagnosis not present

## 2019-03-14 DIAGNOSIS — M62830 Muscle spasm of back: Secondary | ICD-10-CM | POA: Diagnosis not present

## 2019-03-14 DIAGNOSIS — M5136 Other intervertebral disc degeneration, lumbar region: Secondary | ICD-10-CM | POA: Diagnosis not present

## 2019-03-16 DIAGNOSIS — M9903 Segmental and somatic dysfunction of lumbar region: Secondary | ICD-10-CM | POA: Diagnosis not present

## 2019-03-16 DIAGNOSIS — M9902 Segmental and somatic dysfunction of thoracic region: Secondary | ICD-10-CM | POA: Diagnosis not present

## 2019-03-16 DIAGNOSIS — M5136 Other intervertebral disc degeneration, lumbar region: Secondary | ICD-10-CM | POA: Diagnosis not present

## 2019-03-16 DIAGNOSIS — M62830 Muscle spasm of back: Secondary | ICD-10-CM | POA: Diagnosis not present

## 2019-03-21 DIAGNOSIS — M9902 Segmental and somatic dysfunction of thoracic region: Secondary | ICD-10-CM | POA: Diagnosis not present

## 2019-03-21 DIAGNOSIS — M5136 Other intervertebral disc degeneration, lumbar region: Secondary | ICD-10-CM | POA: Diagnosis not present

## 2019-03-21 DIAGNOSIS — M9903 Segmental and somatic dysfunction of lumbar region: Secondary | ICD-10-CM | POA: Diagnosis not present

## 2019-03-21 DIAGNOSIS — M62830 Muscle spasm of back: Secondary | ICD-10-CM | POA: Diagnosis not present

## 2019-03-24 DIAGNOSIS — M9903 Segmental and somatic dysfunction of lumbar region: Secondary | ICD-10-CM | POA: Diagnosis not present

## 2019-03-24 DIAGNOSIS — M5136 Other intervertebral disc degeneration, lumbar region: Secondary | ICD-10-CM | POA: Diagnosis not present

## 2019-03-24 DIAGNOSIS — M62830 Muscle spasm of back: Secondary | ICD-10-CM | POA: Diagnosis not present

## 2019-03-24 DIAGNOSIS — M9902 Segmental and somatic dysfunction of thoracic region: Secondary | ICD-10-CM | POA: Diagnosis not present

## 2019-03-28 DIAGNOSIS — M9902 Segmental and somatic dysfunction of thoracic region: Secondary | ICD-10-CM | POA: Diagnosis not present

## 2019-03-28 DIAGNOSIS — M5136 Other intervertebral disc degeneration, lumbar region: Secondary | ICD-10-CM | POA: Diagnosis not present

## 2019-03-28 DIAGNOSIS — M9903 Segmental and somatic dysfunction of lumbar region: Secondary | ICD-10-CM | POA: Diagnosis not present

## 2019-03-28 DIAGNOSIS — M62830 Muscle spasm of back: Secondary | ICD-10-CM | POA: Diagnosis not present

## 2019-04-04 DIAGNOSIS — M62830 Muscle spasm of back: Secondary | ICD-10-CM | POA: Diagnosis not present

## 2019-04-04 DIAGNOSIS — M9903 Segmental and somatic dysfunction of lumbar region: Secondary | ICD-10-CM | POA: Diagnosis not present

## 2019-04-04 DIAGNOSIS — M5136 Other intervertebral disc degeneration, lumbar region: Secondary | ICD-10-CM | POA: Diagnosis not present

## 2019-04-04 DIAGNOSIS — M9902 Segmental and somatic dysfunction of thoracic region: Secondary | ICD-10-CM | POA: Diagnosis not present

## 2019-04-06 ENCOUNTER — Other Ambulatory Visit: Payer: Self-pay

## 2019-04-11 DIAGNOSIS — M62830 Muscle spasm of back: Secondary | ICD-10-CM | POA: Diagnosis not present

## 2019-04-11 DIAGNOSIS — M5136 Other intervertebral disc degeneration, lumbar region: Secondary | ICD-10-CM | POA: Diagnosis not present

## 2019-04-11 DIAGNOSIS — M9902 Segmental and somatic dysfunction of thoracic region: Secondary | ICD-10-CM | POA: Diagnosis not present

## 2019-04-11 DIAGNOSIS — M9903 Segmental and somatic dysfunction of lumbar region: Secondary | ICD-10-CM | POA: Diagnosis not present

## 2019-04-18 DIAGNOSIS — M5033 Other cervical disc degeneration, cervicothoracic region: Secondary | ICD-10-CM | POA: Diagnosis not present

## 2019-04-18 DIAGNOSIS — M5136 Other intervertebral disc degeneration, lumbar region: Secondary | ICD-10-CM | POA: Diagnosis not present

## 2019-04-18 DIAGNOSIS — M9901 Segmental and somatic dysfunction of cervical region: Secondary | ICD-10-CM | POA: Diagnosis not present

## 2019-04-18 DIAGNOSIS — M9903 Segmental and somatic dysfunction of lumbar region: Secondary | ICD-10-CM | POA: Diagnosis not present

## 2019-05-17 DIAGNOSIS — M9903 Segmental and somatic dysfunction of lumbar region: Secondary | ICD-10-CM | POA: Diagnosis not present

## 2019-05-17 DIAGNOSIS — M5136 Other intervertebral disc degeneration, lumbar region: Secondary | ICD-10-CM | POA: Diagnosis not present

## 2019-05-17 DIAGNOSIS — M5033 Other cervical disc degeneration, cervicothoracic region: Secondary | ICD-10-CM | POA: Diagnosis not present

## 2019-05-17 DIAGNOSIS — M9901 Segmental and somatic dysfunction of cervical region: Secondary | ICD-10-CM | POA: Diagnosis not present

## 2019-05-18 DIAGNOSIS — M25511 Pain in right shoulder: Secondary | ICD-10-CM | POA: Diagnosis not present

## 2019-05-21 DIAGNOSIS — M9903 Segmental and somatic dysfunction of lumbar region: Secondary | ICD-10-CM | POA: Diagnosis not present

## 2019-05-21 DIAGNOSIS — M5136 Other intervertebral disc degeneration, lumbar region: Secondary | ICD-10-CM | POA: Diagnosis not present

## 2019-05-21 DIAGNOSIS — M9901 Segmental and somatic dysfunction of cervical region: Secondary | ICD-10-CM | POA: Diagnosis not present

## 2019-05-21 DIAGNOSIS — M5033 Other cervical disc degeneration, cervicothoracic region: Secondary | ICD-10-CM | POA: Diagnosis not present

## 2019-05-23 DIAGNOSIS — M5033 Other cervical disc degeneration, cervicothoracic region: Secondary | ICD-10-CM | POA: Diagnosis not present

## 2019-05-23 DIAGNOSIS — M5136 Other intervertebral disc degeneration, lumbar region: Secondary | ICD-10-CM | POA: Diagnosis not present

## 2019-05-23 DIAGNOSIS — M9901 Segmental and somatic dysfunction of cervical region: Secondary | ICD-10-CM | POA: Diagnosis not present

## 2019-05-23 DIAGNOSIS — M9903 Segmental and somatic dysfunction of lumbar region: Secondary | ICD-10-CM | POA: Diagnosis not present

## 2019-05-26 DIAGNOSIS — M6281 Muscle weakness (generalized): Secondary | ICD-10-CM | POA: Diagnosis not present

## 2019-05-26 DIAGNOSIS — M25611 Stiffness of right shoulder, not elsewhere classified: Secondary | ICD-10-CM | POA: Diagnosis not present

## 2019-06-01 DIAGNOSIS — M6281 Muscle weakness (generalized): Secondary | ICD-10-CM | POA: Diagnosis not present

## 2019-06-01 DIAGNOSIS — M25611 Stiffness of right shoulder, not elsewhere classified: Secondary | ICD-10-CM | POA: Diagnosis not present

## 2019-06-03 DIAGNOSIS — M6281 Muscle weakness (generalized): Secondary | ICD-10-CM | POA: Diagnosis not present

## 2019-06-03 DIAGNOSIS — M25611 Stiffness of right shoulder, not elsewhere classified: Secondary | ICD-10-CM | POA: Diagnosis not present

## 2019-06-07 DIAGNOSIS — M25611 Stiffness of right shoulder, not elsewhere classified: Secondary | ICD-10-CM | POA: Diagnosis not present

## 2019-06-07 DIAGNOSIS — M6281 Muscle weakness (generalized): Secondary | ICD-10-CM | POA: Diagnosis not present

## 2019-06-08 DIAGNOSIS — M9901 Segmental and somatic dysfunction of cervical region: Secondary | ICD-10-CM | POA: Diagnosis not present

## 2019-06-08 DIAGNOSIS — M5033 Other cervical disc degeneration, cervicothoracic region: Secondary | ICD-10-CM | POA: Diagnosis not present

## 2019-06-08 DIAGNOSIS — M25611 Stiffness of right shoulder, not elsewhere classified: Secondary | ICD-10-CM | POA: Diagnosis not present

## 2019-06-08 DIAGNOSIS — M9903 Segmental and somatic dysfunction of lumbar region: Secondary | ICD-10-CM | POA: Diagnosis not present

## 2019-06-08 DIAGNOSIS — M5136 Other intervertebral disc degeneration, lumbar region: Secondary | ICD-10-CM | POA: Diagnosis not present

## 2019-06-09 DIAGNOSIS — M6281 Muscle weakness (generalized): Secondary | ICD-10-CM | POA: Diagnosis not present

## 2019-06-09 DIAGNOSIS — M25611 Stiffness of right shoulder, not elsewhere classified: Secondary | ICD-10-CM | POA: Diagnosis not present

## 2019-06-14 DIAGNOSIS — M6281 Muscle weakness (generalized): Secondary | ICD-10-CM | POA: Diagnosis not present

## 2019-06-14 DIAGNOSIS — M25611 Stiffness of right shoulder, not elsewhere classified: Secondary | ICD-10-CM | POA: Diagnosis not present

## 2019-06-16 DIAGNOSIS — M25661 Stiffness of right knee, not elsewhere classified: Secondary | ICD-10-CM | POA: Diagnosis not present

## 2019-06-16 DIAGNOSIS — M6281 Muscle weakness (generalized): Secondary | ICD-10-CM | POA: Diagnosis not present

## 2019-06-21 DIAGNOSIS — M6281 Muscle weakness (generalized): Secondary | ICD-10-CM | POA: Diagnosis not present

## 2019-06-21 DIAGNOSIS — M25611 Stiffness of right shoulder, not elsewhere classified: Secondary | ICD-10-CM | POA: Diagnosis not present

## 2019-06-23 DIAGNOSIS — M6281 Muscle weakness (generalized): Secondary | ICD-10-CM | POA: Diagnosis not present

## 2019-06-23 DIAGNOSIS — M25611 Stiffness of right shoulder, not elsewhere classified: Secondary | ICD-10-CM | POA: Diagnosis not present

## 2019-06-28 DIAGNOSIS — M6281 Muscle weakness (generalized): Secondary | ICD-10-CM | POA: Diagnosis not present

## 2019-06-28 DIAGNOSIS — M25611 Stiffness of right shoulder, not elsewhere classified: Secondary | ICD-10-CM | POA: Diagnosis not present

## 2019-06-29 DIAGNOSIS — M9901 Segmental and somatic dysfunction of cervical region: Secondary | ICD-10-CM | POA: Diagnosis not present

## 2019-06-29 DIAGNOSIS — M5136 Other intervertebral disc degeneration, lumbar region: Secondary | ICD-10-CM | POA: Diagnosis not present

## 2019-06-29 DIAGNOSIS — M5033 Other cervical disc degeneration, cervicothoracic region: Secondary | ICD-10-CM | POA: Diagnosis not present

## 2019-06-29 DIAGNOSIS — M9903 Segmental and somatic dysfunction of lumbar region: Secondary | ICD-10-CM | POA: Diagnosis not present

## 2019-07-04 ENCOUNTER — Encounter: Payer: Self-pay | Admitting: Radiology

## 2019-07-04 ENCOUNTER — Other Ambulatory Visit: Payer: Self-pay

## 2019-07-04 ENCOUNTER — Encounter: Payer: Self-pay | Admitting: Family Medicine

## 2019-07-04 ENCOUNTER — Ambulatory Visit (INDEPENDENT_AMBULATORY_CARE_PROVIDER_SITE_OTHER): Payer: Medicare Other | Admitting: Family Medicine

## 2019-07-04 VITALS — BP 136/86 | HR 74 | Wt 222.0 lb

## 2019-07-04 DIAGNOSIS — Z01419 Encounter for gynecological examination (general) (routine) without abnormal findings: Secondary | ICD-10-CM

## 2019-07-04 DIAGNOSIS — N8110 Cystocele, unspecified: Secondary | ICD-10-CM

## 2019-07-04 NOTE — Progress Notes (Signed)
LAST PAP 2016- NORMAL  MAMMOGRAM- 10/26/2018- NORMAL

## 2019-07-04 NOTE — Progress Notes (Signed)
   GYNECOLOGY PROBLEM  VISIT ENCOUNTER NOTE  Subjective:   Darlene Lawrence is a 79 y.o. [phone] female here for a a problem GYN visit.  Current complaints: persistent cystocoele, denies irritation or frequent UTI (last UTI was "many years ago").   Denies abnormal vaginal bleeding, discharge, pelvic pain, problems with intercourse or other gynecologic concerns.    Gynecologic History No LMP recorded (lmp unknown). Patient is postmenopausal. Contraception: post menopausal status  Health Maintenance Due  Topic Date Due  . COLONOSCOPY  10/11/2018    The following portions of the patient's history were reviewed and updated as appropriate: allergies, current medications, past family history, past medical history, past social history, past surgical history and problem list.  Review of Systems Pertinent items are noted in HPI.   Objective:  BP 136/86   Pulse 74   Wt 222 lb (100.7 kg)   LMP  (LMP Unknown)   BMI 37.52 kg/m  Gen: well appearing, NAD HEENT: no scleral icterus CV: RR Lung: Normal WOB Ext: warm well perfused Breast: symmetric. No nipple discharge. Has hyperpigmented seborrheic keratosis on left breast that is stable  PELVIC: Normal appearing external genitalia; normal appearing vaginal mucosa and cervix.  No abnormal discharge noted.  Pap not indicated.  + large protruding cystocele/bladder prolapse. Able to reduce easily Normal uterine size, no other palpable masses, no uterine or adnexal tenderness.   Assessment and Plan:   1. Well woman exam with routine gynecological exam Pap up to date, no need for further paps  Counseled on need for CRC screening Reviewed having regular mammograms  2. Bladder prolapse - Had work up for surgical repair but was worried about permanent cathtererization after procedure and then did not follow up - Denies changes in sx. No irritation, no increased UTIs recently.   Please refer to After Visit Summary for other counseling  recommendations.   Return in about 1 year (around 07/03/2020) for Yearly wellness exam.  Caren Macadam, MD, MPH, ABFM Attending [place] for [facility]

## 2019-07-05 ENCOUNTER — Encounter: Payer: Self-pay | Admitting: Radiology

## 2019-07-05 DIAGNOSIS — M25611 Stiffness of right shoulder, not elsewhere classified: Secondary | ICD-10-CM | POA: Diagnosis not present

## 2019-07-05 DIAGNOSIS — M6281 Muscle weakness (generalized): Secondary | ICD-10-CM | POA: Diagnosis not present

## 2019-07-07 DIAGNOSIS — M25611 Stiffness of right shoulder, not elsewhere classified: Secondary | ICD-10-CM | POA: Diagnosis not present

## 2019-07-07 DIAGNOSIS — M6281 Muscle weakness (generalized): Secondary | ICD-10-CM | POA: Diagnosis not present

## 2019-07-12 DIAGNOSIS — M25611 Stiffness of right shoulder, not elsewhere classified: Secondary | ICD-10-CM | POA: Diagnosis not present

## 2019-07-12 DIAGNOSIS — M6281 Muscle weakness (generalized): Secondary | ICD-10-CM | POA: Diagnosis not present

## 2019-07-14 DIAGNOSIS — M25611 Stiffness of right shoulder, not elsewhere classified: Secondary | ICD-10-CM | POA: Diagnosis not present

## 2019-07-14 DIAGNOSIS — M6281 Muscle weakness (generalized): Secondary | ICD-10-CM | POA: Diagnosis not present

## 2019-07-27 DIAGNOSIS — M9901 Segmental and somatic dysfunction of cervical region: Secondary | ICD-10-CM | POA: Diagnosis not present

## 2019-07-27 DIAGNOSIS — M9903 Segmental and somatic dysfunction of lumbar region: Secondary | ICD-10-CM | POA: Diagnosis not present

## 2019-07-27 DIAGNOSIS — M5136 Other intervertebral disc degeneration, lumbar region: Secondary | ICD-10-CM | POA: Diagnosis not present

## 2019-07-27 DIAGNOSIS — M5033 Other cervical disc degeneration, cervicothoracic region: Secondary | ICD-10-CM | POA: Diagnosis not present

## 2019-08-08 ENCOUNTER — Other Ambulatory Visit: Payer: Self-pay | Admitting: *Deleted

## 2019-08-08 MED ORDER — TRIAMTERENE-HCTZ 37.5-25 MG PO TABS: 0.5000 | ORAL_TABLET | Freq: Every day | ORAL | 1 refills | Status: DC

## 2019-09-07 DIAGNOSIS — M5033 Other cervical disc degeneration, cervicothoracic region: Secondary | ICD-10-CM | POA: Diagnosis not present

## 2019-09-07 DIAGNOSIS — M9903 Segmental and somatic dysfunction of lumbar region: Secondary | ICD-10-CM | POA: Diagnosis not present

## 2019-09-07 DIAGNOSIS — M9901 Segmental and somatic dysfunction of cervical region: Secondary | ICD-10-CM | POA: Diagnosis not present

## 2019-09-07 DIAGNOSIS — M5136 Other intervertebral disc degeneration, lumbar region: Secondary | ICD-10-CM | POA: Diagnosis not present

## 2019-10-04 ENCOUNTER — Other Ambulatory Visit: Payer: Self-pay | Admitting: Family Medicine

## 2019-10-04 DIAGNOSIS — Z1231 Encounter for screening mammogram for malignant neoplasm of breast: Secondary | ICD-10-CM

## 2019-10-05 DIAGNOSIS — M5033 Other cervical disc degeneration, cervicothoracic region: Secondary | ICD-10-CM | POA: Diagnosis not present

## 2019-10-05 DIAGNOSIS — M5136 Other intervertebral disc degeneration, lumbar region: Secondary | ICD-10-CM | POA: Diagnosis not present

## 2019-10-05 DIAGNOSIS — M9903 Segmental and somatic dysfunction of lumbar region: Secondary | ICD-10-CM | POA: Diagnosis not present

## 2019-10-05 DIAGNOSIS — M9901 Segmental and somatic dysfunction of cervical region: Secondary | ICD-10-CM | POA: Diagnosis not present

## 2019-11-02 ENCOUNTER — Other Ambulatory Visit: Payer: Self-pay

## 2019-11-02 ENCOUNTER — Ambulatory Visit
Admission: RE | Admit: 2019-11-02 | Discharge: 2019-11-02 | Disposition: A | Discharge status: Home / Self Care | Payer: Medicare Other | Source: Ambulatory Visit | Attending: Family Medicine | Admitting: Family Medicine

## 2019-11-02 DIAGNOSIS — Z1231 Encounter for screening mammogram for malignant neoplasm of breast: Secondary | ICD-10-CM

## 2019-11-20 ENCOUNTER — Telehealth: Payer: Medicare Other | Admitting: Family

## 2019-11-20 DIAGNOSIS — B9689 Other specified bacterial agents as the cause of diseases classified elsewhere: Secondary | ICD-10-CM | POA: Diagnosis not present

## 2019-11-20 DIAGNOSIS — J208 Acute bronchitis due to other specified organisms: Secondary | ICD-10-CM | POA: Diagnosis not present

## 2019-11-20 MED ORDER — AZITHROMYCIN 250 MG PO TABS: ORAL_TABLET | ORAL | 0 refills | Status: DC

## 2019-11-20 MED ORDER — BENZONATATE 100 MG PO CAPS: 100.0000 mg | ORAL_CAPSULE | Freq: Three times a day (TID) | ORAL | 0 refills | Status: DC | PRN

## 2019-11-20 NOTE — Progress Notes (Signed)
We are sorry that you are not feeling well.  Here is how we plan to help!  Based on your presentation I believe you most likely have A cough due to bacteria.  When patients have a fever and a productive cough with a change in color or increased sputum production, we are concerned about bacterial bronchitis.  If left untreated it can progress to pneumonia.  If your symptoms do not improve with your treatment plan it is important that you contact your provider.   I have prescribed Azithromyin 250 mg: two tablets now and then one tablet daily for 4 additonal days    In addition you may use A non-prescription cough medication called Robitussin DAC. Take 2 teaspoons every 8 hours or Delsym: take 2 teaspoons every 12 hours., A non-prescription cough medication called Mucinex DM: take 2 tablets every 12 hours. and A prescription cough medication called Tessalon Perles 100mg . You may take 1-2 capsules every 8 hours as needed for your cough.  Given age we will go ahead and treat with antibiotics.   From your responses in the eVisit questionnaire you describe inflammation in the upper respiratory tract which is causing a significant cough.  This is commonly called Bronchitis and has four common causes:    Allergies  Viral Infections  Acid Reflux  Bacterial Infection Allergies, viruses and acid reflux are treated by controlling symptoms or eliminating the cause. An example might be a cough caused by taking certain blood pressure medications. You stop the cough by changing the medication. Another example might be a cough caused by acid reflux. Controlling the reflux helps control the cough.  USE OF BRONCHODILATOR ("RESCUE") INHALERS: There is a risk from using your bronchodilator too frequently.  The risk is that over-reliance on a medication which only relaxes the muscles surrounding the breathing tubes can reduce the effectiveness of medications prescribed to reduce swelling and congestion of the tubes  themselves.  Although you feel brief relief from the bronchodilator inhaler, your asthma may actually be worsening with the tubes becoming more swollen and filled with mucus.  This can delay other crucial treatments, such as oral steroid medications. If you need to use a bronchodilator inhaler daily, several times per day, you should discuss this with your provider.  There are probably better treatments that could be used to keep your asthma under control.     HOME CARE . Only take medications as instructed by your medical team. . Complete the entire course of an antibiotic. . Drink plenty of fluids and get plenty of rest. . Avoid close contacts especially the very young and the elderly . Cover your mouth if you cough or cough into your sleeve. . Always remember to wash your hands . A steam or ultrasonic humidifier can help congestion.   GET HELP RIGHT AWAY IF: . You develop worsening fever. . You become short of breath . You cough up blood. . Your symptoms persist after you have completed your treatment plan MAKE SURE YOU   Understand these instructions.  Will watch your condition.  Will get help right away if you are not doing well or get worse.  Your e-visit answers were reviewed by a board certified advanced clinical practitioner to complete your personal care plan.  Depending on the condition, your plan could have included both over the counter or prescription medications. If there is a problem please reply  once you have received a response from your provider. Your safety is important to [place].  If you have drug allergies check your prescription carefully.    You can use MyChart to ask questions about today's visit, request a non-urgent call back, or ask for a work or school excuse for 24 hours related to this e-Visit. If it has been greater than 24 hours you will need to follow up with your provider, or enter a new e-Visit to address those concerns. You will get an e-mail in the next  two days asking about your experience.  I hope that your e-visit has been valuable and will speed your recovery. Thank you for using e-visits.  Approximately 5 minutes was spent documenting and reviewing patient's chart.

## 2019-11-21 ENCOUNTER — Telehealth (INDEPENDENT_AMBULATORY_CARE_PROVIDER_SITE_OTHER): Payer: Medicare Other | Admitting: Internal Medicine

## 2019-11-21 ENCOUNTER — Encounter: Payer: Self-pay | Admitting: Internal Medicine

## 2019-11-21 VITALS — BP 127/72 | Temp 101.4°F

## 2019-11-21 DIAGNOSIS — J40 Bronchitis, not specified as acute or chronic: Secondary | ICD-10-CM

## 2019-11-21 NOTE — Patient Instructions (Signed)

## 2019-11-21 NOTE — Progress Notes (Signed)
Virtual Visit via Video Note  I connected with Darlene Lawrence on 11/21/19 at  3:00 PM EDT by a video enabled telemedicine application and verified that I am speaking with the correct person using two identifiers.  Location: Patient: Home Provider:Office   I discussed the limitations of evaluation and management by telemedicine and the availability of in person appointments. The patient expressed understanding and agreed to proceed.  History of Present Illness:  Pt reports fatigue and cough. This started 2-3 days ago. The cough is mostly nonproductive. She denies headache, eye pain, eye redness, eye drainage, runny nose, nasal congestion, ear pain, sore throat, loss of taste/smell or SOB. She reports fever up to 101, but denies chills or body aches.  She was seen for an E Visit yesterday, diagnosed with bronchitis. She was started on Azithromycin, Tessalon Pearles and advised to use Robitussin AC OTC.    Past Medical History:  Diagnosis Date  . Abnormal Pap smear   . Arthritis   . breast cancer 2009   right  . Cancer ([place])   . CHF (congestive heart failure) ([place])   . DDD (degenerative disc disease), lumbar   . High cholesterol   . Hyperlipidemia   . Hypertension   . Meniscus tear    right  . Personal history of radiation therapy   . RBBB     Current Outpatient Medications  Medication Sig Dispense Refill  . Aspirin (ASPIR-81 PO) Take 1 tablet by mouth.    Marland Kitchen azithromycin (ZITHROMAX) 250 MG tablet Take 500 mg once, then 250 mg for four days 6 tablet 0  . b complex vitamins tablet Take 1 tablet by mouth daily.    . benzonatate (TESSALON PERLES) 100 MG capsule Take 1 capsule (100 mg total) by mouth 3 (three) times daily as needed. 20 capsule 0  . Calcium Citrate-Vitamin D (CALCIUM CITRATE + PO) Take 1 tablet by mouth daily.    . Cholecalciferol (VITAMIN D3) 2000 UNITS TABS Take 1 capsule by mouth 2 (two) times daily.    . clindamycin (CLEOCIN) 150 MG capsule Take 150 mg by  mouth as needed. Takes 4 tablets before a dental procedure    . Coenzyme Q10 (COQ10) 100 MG CAPS Take 1 capsule by mouth daily.    . Misc Natural Products (GLUCOSAMINE CHOND MSM FORMULA PO) Take 1,500 mg by mouth.    . Multiple Vitamin (MULTI VITAMIN DAILY PO) Take 1 tablet by mouth.    . Omega-3 Fatty Acids (FISH OIL) 1200 MG CAPS Take 1 capsule by mouth.    . rosuvastatin (CRESTOR) 10 MG tablet Take 10 mg by mouth 2 (two) times a week. Monday, Thursday.    . triamterene-hydrochlorothiazide (MAXZIDE-25) 37.5-25 MG tablet Take 0.5 tablets by mouth daily. 45 tablet 1   No current facility-administered medications for this visit.    Allergies  Allergen Reactions  . Cefixime     REACTION: Rash  . Levaquin [Levofloxacin] Other (See Comments)    Shoulder Bursitis  . Penicillins     REACTION: Rash    Family History  Problem Relation Age of Onset  . Cancer Mother        breast and colon  . Breast cancer Mother   . Hypertension Father   . Heart disease Father   . Thyroid disease Father   . Down syndrome Son   . Asthma Other     Social History   Socioeconomic History  . Marital status: Married    Spouse name:  Not on file  . Number of children: 2  . Years of education: Not on file  . Highest education level: Not on file  Occupational History  . 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 Smoking status: Never Smoker  . Smokeless tobacco: Never Used  Vaping Use  . Vaping Use: Never used  Substance and Sexual Activity  . Alcohol use: Yes    Comment: once a month  . Drug use: No  . Sexual activity: Yes    Partners: Male    Birth control/protection: Post-menopausal  Other Topics Concern  . Not on file  Social History Narrative   Regular exercise--yes, curves 3 times a week   Diet-- fruits and veggies, water, no fast food       Social Determinants of Health   Financial Resource Strain:   . Difficulty of Paying Living  Expenses:   Food Insecurity:   . Worried About Charity fundraiser in the Last Year:   . Arboriculturist in the Last Year:   Transportation Needs:   . Film/video editor (Medical):   Marland Kitchen Lack of Transportation (Non-Medical):   Physical Activity:   . Days of Exercise per Week:   . Minutes of Exercise per Session:   Stress:   . Feeling of Stress :   Social Connections:   . Frequency of Communication with Friends and Family:   . Frequency of Social Gatherings with Friends and Family:   . Attends Religious Services:   . Active Member of Clubs or Organizations:   . Attends Archivist Meetings:   Marland Kitchen Marital Status:   Intimate Partner Violence:   . Fear of Current or Ex-Partner:   . Emotionally Abused:   Marland Kitchen Physically Abused:   . Sexually Abused:      Constitutional: Pt reports fatigue. Denies fever, malaise, headache or abrupt weight changes.  HEENT: Denies eye pain, eye redness, ear pain, ringing in the ears, wax buildup, runny nose, nasal congestion, bloody nose, or sore throat. Respiratory: Pt reports cough. Denies difficulty breathing, shortness of breath, or sputum production.   Cardiovascular: Denies chest pain, chest tightness, palpitations or swelling in the hands or feet.   No other specific complaints in a complete review of systems (except as listed in HPI above).  Observations/Objective:  BP 127/72   Temp (!) 101.4 F (38.6 C) (Oral)   LMP  (LMP Unknown)   SpO2 94%   Wt Readings from Last 3 Encounters:  07/04/19 222 lb (100.7 kg)  11/30/18 222 lb 4 oz (100.8 kg)  04/27/18 219 lb 6.4 oz (99.5 kg)    General: Appears her stated age, obese, in NAD. HEENT: Head: normal shape and size; Nose: no congestion noted; Throat/Mouth: no hoarseness noted.  Pulmonary/Chest: Normal effort. Dry cough noted. No respiratory distress.  Neurological: Alert and oriented.   BMET    Component Value Date/Time   NA 139 11/24/2018 1003   NA 143 09/18/2014 1027   K 4.0  11/24/2018 1003   K 3.9 09/18/2014 1027   CL 103 11/24/2018 1003   CL 107 10/14/2012 0951   CO2 27 11/24/2018 1003   CO2 25 09/18/2014 1027   GLUCOSE 102 (H) 11/24/2018 1003   GLUCOSE 108 09/18/2014 1027   GLUCOSE 92 10/14/2012 0951   BUN 29 (H) 11/24/2018 1003   BUN 26.5 (H) 09/18/2014 1027   CREATININE 1.18 11/24/2018 1003   CREATININE 1.1  09/18/2014 1027   CALCIUM 9.0 11/24/2018 1003   CALCIUM 9.7 09/18/2014 1027   GFRNONAA >60 09/19/2016 0520   GFRAA >60 09/19/2016 0520    Lipid Panel     Component Value Date/Time   CHOL 165 11/24/2018 1003   TRIG 155.0 (H) 11/24/2018 1003   HDL 42.40 11/24/2018 1003   CHOLHDL 4 11/24/2018 1003   VLDL 31.0 11/24/2018 1003   LDLCALC 92 11/24/2018 1003    CBC    Component Value Date/Time   WBC 5.5 11/06/2017 1237   RBC 3.88 11/06/2017 1237   HGB 13.3 11/06/2017 1237   HGB 13.3 09/18/2014 1027   HCT 39.3 11/06/2017 1237   HCT 39.6 09/18/2014 1027   PLT 154.0 11/06/2017 1237   PLT 148 09/18/2014 1027   MCV 101.3 (H) 11/06/2017 1237   MCV 101.5 (H) 09/18/2014 1027   MCH 33.4 09/17/2016 0424   MCHC 33.9 11/06/2017 1237   RDW 13.5 11/06/2017 1237   RDW 13.8 09/18/2014 1027   LYMPHSABS 3.0 11/06/2017 1237   LYMPHSABS 2.2 09/18/2014 1027   MONOABS 0.4 11/06/2017 1237   MONOABS 0.5 09/18/2014 1027   EOSABS 0.2 11/06/2017 1237   EOSABS 0.3 09/18/2014 1027   BASOSABS 0.0 11/06/2017 1237   BASOSABS 0.0 09/18/2014 1027    Hgb A1C No results found for: HGBA1C     Assessment and Plan:  Bronchitis:  No indication for steroids or inhaler at this time Continue Azithromax, Tessalon and Robitussin AC She understands if she is worse, she needs in person evaluation  Return precautions discussed Webb Silversmith, NP   Follow Up Instructions:    I discussed the assessment and treatment plan with the patient. The patient was provided an opportunity to ask questions and all were answered. The patient agreed with the plan and  demonstrated an understanding of the instructions.   The patient was advised to call back or seek an in-person evaluation if the symptoms worsen or if the condition fails to improve as anticipated.    Webb Silversmith, NP

## 2019-11-22 ENCOUNTER — Other Ambulatory Visit: Payer: Self-pay

## 2019-11-22 ENCOUNTER — Ambulatory Visit
Admission: RE | Admit: 2019-11-22 | Discharge: 2019-11-22 | Disposition: A | Discharge status: Home / Self Care | Payer: Medicare Other | Source: Ambulatory Visit | Attending: Emergency Medicine | Admitting: Emergency Medicine

## 2019-11-22 ENCOUNTER — Ambulatory Visit (INDEPENDENT_AMBULATORY_CARE_PROVIDER_SITE_OTHER): Payer: Medicare Other

## 2019-11-22 VITALS — BP 118/72 | HR 58 | Temp 99.4°F | Resp 20

## 2019-11-22 DIAGNOSIS — R5383 Other fatigue: Secondary | ICD-10-CM | POA: Diagnosis not present

## 2019-11-22 DIAGNOSIS — R05 Cough: Secondary | ICD-10-CM

## 2019-11-22 DIAGNOSIS — I10 Essential (primary) hypertension: Secondary | ICD-10-CM | POA: Diagnosis not present

## 2019-11-22 DIAGNOSIS — Z20822 Contact with and (suspected) exposure to covid-19: Secondary | ICD-10-CM

## 2019-11-22 DIAGNOSIS — J22 Unspecified acute lower respiratory infection: Secondary | ICD-10-CM

## 2019-11-22 DIAGNOSIS — R059 Cough, unspecified: Secondary | ICD-10-CM

## 2019-11-22 LAB — BASIC METABOLIC PANEL
Anion gap: 9 (ref 5–15)
BUN: 25 mg/dL — ABNORMAL HIGH (ref 8–23)
CO2: 26 mmol/L (ref 22–32)
Calcium: 8.7 mg/dL — ABNORMAL LOW (ref 8.9–10.3)
Chloride: 98 mmol/L (ref 98–111)
Creatinine, Ser: 1.26 mg/dL — ABNORMAL HIGH (ref 0.44–1.00)
GFR calc Af Amer: 48 mL/min — ABNORMAL LOW (ref >60)
GFR calc non Af Amer: 41 mL/min — ABNORMAL LOW (ref >60)
Glucose, Bld: 114 mg/dL — ABNORMAL HIGH (ref 70–99)
Potassium: 3.6 mmol/L (ref 3.5–5.1)
Sodium: 133 mmol/L — ABNORMAL LOW (ref 135–145)

## 2019-11-22 LAB — SARS CORONAVIRUS 2 (TAT 6-24 HRS): SARS Coronavirus 2: NEGATIVE

## 2019-11-22 MED ORDER — AMOXICILLIN-POT CLAVULANATE 875-125 MG PO TABS
1.0000 | ORAL_TABLET | Freq: Two times a day (BID) | ORAL | 0 refills | Status: DC
Start: 2019-11-22 — End: 2019-11-25

## 2019-11-22 MED ORDER — ALBUTEROL SULFATE HFA 108 (90 BASE) MCG/ACT IN AERS: 2.0000 | INHALATION_SPRAY | RESPIRATORY_TRACT | 0 refills | Status: DC | PRN

## 2019-11-22 MED ORDER — AEROCHAMBER PLUS MISC
2 refills | Status: DC
Start: 2019-11-22 — End: 2019-12-06

## 2019-11-22 MED ORDER — EPINEPHRINE 0.3 MG/0.3ML IJ SOAJ
0.3000 mg | Freq: Once | INTRAMUSCULAR | 0 refills | Status: AC
Start: 2019-11-22 — End: 2019-11-22

## 2019-11-22 NOTE — ED Triage Notes (Signed)
Pt reports cough x 4-5.  Temp of 101.4 yesterday, fatigue, nasal congestion.  Denies SOB, body aches, HA, CP.   Cough is not productive.  Started Z-pak and [employer] two days ago after [employer]. Unsure if meds are helping.   Has had COVID vaccines but agreeable to COVID test.

## 2019-11-22 NOTE — ED Provider Notes (Signed)
HPI  SUBJECTIVE:  Darlene Lawrence is a 79 y.o. female who presents with 4 days of a nonproductive cough, fevers T-max 101.4.  She reports nausea yesterday.  States that she has had similar symptoms before when she had a pneumonia which led to an MI.  She reports decreased appetite, fatigue.  No nasal congestion, sinus pain or pressure, postnasal drip, body aches, headaches, loss of smell or taste, wheezing, shortness of breath, chest pain, pressure or heaviness.  No vomiting, diarrhea, abdominal pain.  No known Covid exposure.  She has had both doses of the Covid vaccine.  No antipyretic in the past 6 hours.  She is on day #3/5 of azithromycin, has also been taking Robitussin, Tessalon, Tylenol and ibuprofen.  The antibiotics, ibuprofen seem to be helping.  No aggravating factors.  Patient had an E-visit 2 days ago, was thought to have a bacterial bronchitis.  She was started on Robitussin DAC, Mucinex DM, Tessalon Perles, Z-Pak.  Antibiotic started due to age and concern for progression to pneumonia.  Had a video visit yesterday for the same, was febrile at 101.4, O2 saturation documented at 94%.  Told to continue azithromycin Tessalon and Robitussin-AC.  States that she is feeling slightly better today.  Patient has with  blood pressure cuff and oxygen monitor at home. States her O2 sat has been as low as 93%, normal oxygen saturation is 97 to 98%.  She has a past medical history of pneumonia, MI, congestive heart failure, hypertension, right-sided breast cancer status post lumpectomy and radiation.  She denies a history of kidney disease although she has stage III chronic kidney disease per chart.  No history of asthma, emphysema, COPD, smoking, diabetes, coronary artery disease, HIV, current immunocompromise.  FTD:DUKGURK, Amy E, MD   Past Medical History:  Diagnosis Date  . Abnormal Pap smear   . Arthritis   . breast cancer 2009   right  . Cancer ([place])   . CHF (congestive heart failure)  ([place])   . DDD (degenerative disc disease), lumbar   . High cholesterol   . Hyperlipidemia   . Hypertension   . Meniscus tear    right  . Personal history of radiation therapy   . RBBB     Past Surgical History:  Procedure Laterality Date  . BREAST BIOPSY Right 08/18/2007  . BREAST LUMPECTOMY Right 2009  . BREAST SURGERY Right 2009   lumpectomy, no radiation, no chemo  . DILATION AND CURETTAGE OF UTERUS  2012   Dr. Fermin Schwab  . HERNIA REPAIR     umbilical  . JOINT REPLACEMENT Left    hip  . LEFT HEART CATH AND CORONARY ANGIOGRAPHY N/A 09/17/2016   Procedure: Left Heart Cath and Coronary Angiography;  Surgeon: Teodoro Spray, MD;  Location: [facility];  Service: Cardiovascular;  Laterality: N/A;  . TOTAL HIP ARTHROPLASTY Left   . 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 TUBAL LIGATION      Family History  Problem Relation Age of Onset  . Cancer Mother        breast and colon  . Breast cancer Mother   . Hypertension Father   . Heart disease Father   . Thyroid disease Father   . Down syndrome Son   . Asthma Other     Social History   Tobacco Use  .  Smoking status: Never Smoker  . Smokeless tobacco: Never Used  Vaping Use  . Vaping Use: Never used  Substance Use Topics  . Alcohol use: Yes    Comment: once a month  . Drug use: No    No current facility-administered medications for this encounter.  Current Outpatient Medications:  .  Aspirin (ASPIR-81 PO), Take 1 tablet by mouth., Disp: , Rfl:  .  azithromycin (ZITHROMAX) 250 MG tablet, Take 500 mg once, then 250 mg for four days, Disp: 6 tablet, Rfl: 0 .  b complex vitamins tablet, Take 1 tablet by mouth daily., Disp: , Rfl:  .  benzonatate (TESSALON PERLES) 100 MG capsule, Take 1 capsule (100 mg total) by mouth 3 (three) times daily as needed., Disp: 20  capsule, Rfl: 0 .  Calcium Citrate-Vitamin D (CALCIUM CITRATE + PO), Take 1 tablet by mouth daily., Disp: , Rfl:  .  Cholecalciferol (VITAMIN D3) 2000 UNITS TABS, Take 1 capsule by mouth 2 (two) times daily., Disp: , Rfl:  .  clindamycin (CLEOCIN) 150 MG capsule, Take 150 mg by mouth as needed. Takes 4 tablets before a dental procedure, Disp: , Rfl:  .  Coenzyme Q10 (COQ10) 100 MG CAPS, Take 1 capsule by mouth daily., Disp: , Rfl:  .  Misc Natural Products (GLUCOSAMINE CHOND MSM FORMULA PO), Take 1,500 mg by mouth., Disp: , Rfl:  .  Multiple Vitamin (MULTI VITAMIN DAILY PO), Take 1 tablet by mouth., Disp: , Rfl:  .  Omega-3 Fatty Acids (FISH OIL) 1200 MG CAPS, Take 1 capsule by mouth., Disp: , Rfl:  .  rosuvastatin (CRESTOR) 10 MG tablet, Take 10 mg by mouth 2 (two) times a week. Monday, Thursday., Disp: , Rfl:  .  triamterene-hydrochlorothiazide (MAXZIDE-25) 37.5-25 MG tablet, Take 0.5 tablets by mouth daily., Disp: 45 tablet, Rfl: 1  Allergies  Allergen Reactions  . Cefixime     REACTION: Rash  . Levaquin [Levofloxacin] Other (See Comments)    Shoulder Bursitis  . Penicillins     REACTION: Rash     ROS  As noted in HPI.   Physical Exam  BP 118/72 (BP Location: Left Arm)   Pulse (!) 58   Temp 99.4 F (37.4 C) (Oral)   Resp 20   LMP  (LMP Unknown)   SpO2 95%   Constitutional: Well developed, well nourished, no acute distress positive wet cough Eyes:  EOMI, conjunctiva normal bilaterally HENT: Normocephalic, atraumatic,mucus membranes moist.  No nasal congestion.  Normal turbinates.  No maxillary, frontal sinus tenderness.  Normal oropharynx without postnasal drip. Respiratory: Normal inspiratory effort, regular rhythm, no murmurs rubs or gallops Cardiovascular: Regular borderline bradycardia, no murmurs rubs or gallops GI: nondistended skin: No rash, skin intact Musculoskeletal: no deformities Neurologic: Alert & oriented x 3, no focal neuro deficits Psychiatric: Speech  and behavior appropriate   ED Course   Medications - No data to display  Orders Placed This Encounter  Procedures  . SARS CORONAVIRUS 2 (TAT 6-24 HRS) Nasopharyngeal Nasopharyngeal Swab    Standing Status:   Standing    Number of Occurrences:   1    Order Specific Question:   Is this test for diagnosis or screening    Answer:   Diagnosis of ill patient    Order Specific Question:   Symptomatic for COVID-19 as defined by CDC    Answer:   Yes    Order Specific Question:   Date of Symptom Onset    Answer:   11/17/2019  Order Specific Question:   Hospitalized for COVID-19    Answer:   Yes    Order Specific Question:   Admitted to ICU for COVID-19    Answer:   No    Order Specific Question:   Previously tested for COVID-19    Answer:   No    Order Specific Question:   Resident in a congregate (group) care setting    Answer:   No    Order Specific Question:   Employed in healthcare setting    Answer:   No    Order Specific Question:   Pregnant    Answer:   No    Order Specific Question:   Has patient completed COVID vaccination(s) (2 doses of Pfizer/Moderna 1 dose of [employer])    Answer:   Yes  . DG Chest 2 View    Cough fever r/o PNA    Standing Status:   Standing    Number of Occurrences:   1    Order Specific Question:   Symptom/Reason for Exam    Answer:   Cough [762.2.ICD-9-CM]  . Basic metabolic panel    Standing Status:   Standing    Number of Occurrences:   1    No results found for this or any previous visit (from the past 24 hour(s)). DG Chest 2 View  Result Date: 11/22/2019 CLINICAL DATA:  Cough, fever, fatigue and nasal congestion. EXAM: CHEST - 2 VIEW COMPARISON:  09/15/2016 FINDINGS: Heart size appears within normal limits. Prominence of the pulmonary arteries noted which may reflect underlying PA hypertension. No pleural effusion or edema. No airspace densities. Spondylosis identified within the thoracic spine. IMPRESSION: No acute cardiopulmonary  abnormalities. Electronically Signed   By: Kerby Moors M.D.   On: 11/22/2019 10:51    ED Clinical Impression  1. Lower respiratory tract infection   2. Cough   3. Encounter for screening laboratory testing for COVID-19 virus      ED Assessment/Plan  Outside records reviewed.  As noted in HPI.  Patient states that she is getting better, however will get chest x-ray to look for pneumonia.  Covid PCR sent.  She will be an infusion candidate due to age.  Creatinine clearance 63 mL/minutes based on creatinine from 11/24/2018.  Will recheck BMP as it has been a year since lab work done  Creatinine today 1.26.  Calculated creatinine clearance 59 mL/minute.  We will need to have this rechecked in several weeks.  Reviewed imaging independently.  Prominent pulmonary arteries which may reflect underlying pulmonary hypertension.  No pleural effusion, edema.  No acute cardiopulmonary disease.  See radiology report for full details.  No pneumonia seen on x-ray.  Patient states that she is starting to feel better, sent home with albuterol inhaler with a spacer to help open her airways and help her cough sputum up.  Home with a wait-and-see prescription of Augmentin.  She is to continue her current plan of azithromycin, Robitussin, Tessalon, Tylenol/ibuprofen.  No NSAIDs.  Tylenol.  Patient states that she was told that she had an allergy to penicillin as a child, does not remember what it was.  Denies anaphylaxis.  will try the Augmentin however will prescribe an EpiPen to her in case she has a severe allergic reaction.  Discussed with her to take 50 mg of Benadryl and use the EpiPen and go to the ER for any signs of anaphylaxis  Discussed labs, imaging, MDM, treatment plan, and plan for follow-up with  patient. Discussed sn/sx that should prompt return to the ED. patient agrees with plan.   No orders of the defined types were placed in this encounter.   *This clinic note was created using Dragon  dictation software. Therefore, there may be occasional mistakes despite careful proofreading.   ?    Melynda Ripple, MD 11/22/19 1126

## 2019-11-22 NOTE — Discharge Instructions (Addendum)
Chest x-ray was negative for a pneumonia.  Your creatinine was slightly elevated above normal today.  We will contact you if your Covid test comes back positive.  You will be a candidate for the Covid outpatient infusion clinic.  Finish the azithromycin, even if you feel better.  I have given you a wait-and-see prescription of Augmentin.  Let the pharmacy know that you were going to wait 24 hours prior to filling this.  Start the Augmentin if you are still having fevers in 24 hours.  Fill the EpiPen as well in case you have a severe allergic reaction to the Augmentin.  Continue the Robitussin, Tessalon, Tylenol.  Stop ibuprofen.  I am sending you home with an albuterol inhaler with a spacer.  2 puffs from this every 4-6 hours to help open up your airways.  Follow-up with your doctor in 3 days.  Go immediately to the ER if you get worse, have chest pain or pressure, shortness of breath, if your oxygen saturation is below 90%, or for any other concerns.  If you start having a severe allergic reaction to the Augmentin, take 50 mg of Benadryl, use the EpiPen, and go immediately to the emergency department

## 2019-11-25 ENCOUNTER — Telehealth: Payer: Self-pay | Admitting: Family Medicine

## 2019-11-25 MED ORDER — DOXYCYCLINE HYCLATE 100 MG PO TABS: 100.0000 mg | ORAL_TABLET | Freq: Two times a day (BID) | ORAL | 0 refills | Status: DC

## 2019-11-25 NOTE — Telephone Encounter (Signed)
Patient left voicemail per [employer].  Seen at urgent Care .. given Augmentin for bacterial bronchitis if not improving after completion of z-pak. CXR unremarkable.  She is hesitant to use Augmentin given PCN allergy. Cannot afford Epi Pen.   Reviewed OV note in detail.  Will change to doxycycline BID x 10 days.  Rx sent to [facility].  pt told to follow up if not improving by early next week.

## 2019-11-29 ENCOUNTER — Telehealth: Payer: Self-pay | Admitting: Family Medicine

## 2019-11-29 DIAGNOSIS — E782 Mixed hyperlipidemia: Secondary | ICD-10-CM

## 2019-11-29 DIAGNOSIS — Z1159 Encounter for screening for other viral diseases: Secondary | ICD-10-CM

## 2019-11-29 NOTE — Telephone Encounter (Signed)
-----   Message from Ellamae Sia sent at 11/18/2019  9:51 AM EDT ----- Regarding: Lab orders for Wednesday, 7.21.21  AWV lab orders, please.

## 2019-11-30 ENCOUNTER — Ambulatory Visit (INDEPENDENT_AMBULATORY_CARE_PROVIDER_SITE_OTHER): Payer: Medicare Other

## 2019-11-30 ENCOUNTER — Other Ambulatory Visit: Payer: Self-pay

## 2019-11-30 ENCOUNTER — Other Ambulatory Visit: Payer: Medicare Other

## 2019-11-30 DIAGNOSIS — Z0000 Encounter for general adult medical examination without abnormal findings: Secondary | ICD-10-CM

## 2019-11-30 NOTE — Patient Instructions (Addendum)
Ms. Bigos , Thank you for taking time to come for your Medicare Wellness Visit. I appreciate your ongoing commitment to your health goals. Please review the following plan we discussed and let me know if I can assist you in the future.   Screening recommendations/referrals: Colonoscopy: due, Patient will discuss with provider first. Mammogram: Up to date, completed 11/02/2019, due 10/2020 Bone Density: due, will discuss with provider first. Recommended yearly ophthalmology/optometry visit for glaucoma screening and checkup Recommended yearly dental visit for hygiene and checkup  Vaccinations: Influenza vaccine: Up to date, completed 02/15/2019, due 12/2019 Pneumococcal vaccine: Completed series Tdap vaccine: Up to date, completed 11/11/2011, due 11/2021 Shingles vaccine: due, check with your insurance regarding coverage   Covid-19: Completed series  Advanced directives: Please bring a copy of your POA (Power of Attorney) and/or Living Will to your next appointment.   Conditions/risks identified: hypertension, hyperlipidemia  Next appointment: Follow up in one year for your annual wellness visit    Preventive Care 36 Years and Older, Female Preventive care refers to lifestyle choices and visits with your health care provider that can promote health and wellness. What does preventive care include?  A yearly physical exam. This is also called an annual well check.  Dental exams once or twice a year.  Routine eye exams. Ask your health care provider how often you should have your eyes checked.  Personal lifestyle choices, including:  Daily care of your teeth and gums.  Regular physical activity.  Eating a healthy diet.  Avoiding tobacco and drug use.  Limiting alcohol use.  Practicing safe sex.  Taking low-dose aspirin every day.  Taking vitamin and mineral supplements as recommended by your health care provider. What happens during an annual well check? The services and  screenings done by your health care provider during your annual well check will depend on your age, overall health, lifestyle risk factors, and family history of disease. Counseling  Your health care provider may ask you questions about your:  Alcohol use.  Tobacco use.  Drug use.  Emotional well-being.  Home and relationship well-being.  Sexual activity.  Eating habits.  History of falls.  Memory and ability to understand (cognition).  Work and work Statistician.  Reproductive health. Screening  You may have the following tests or measurements:  Height, weight, and BMI.  Blood pressure.  Lipid and cholesterol levels. These may be checked every 5 years, or more frequently if you are over 67 years old.  Skin check.  Lung cancer screening. You may have this screening every year starting at age 12 if you have a 30-pack-year history of smoking and currently smoke or have quit within the past 15 years.  Fecal occult blood test (FOBT) of the stool. You may have this test every year starting at age 71.  Flexible sigmoidoscopy or colonoscopy. You may have a sigmoidoscopy every 5 years or a colonoscopy every 10 years starting at age 16.  Hepatitis C blood test.  Hepatitis B blood test.  Sexually transmitted disease (STD) testing.  Diabetes screening. This is done by checking your blood sugar (glucose) after you have not eaten for a while (fasting). You may have this done every 1-3 years.  Bone density scan. This is done to screen for osteoporosis. You may have this done starting at age 67.  Mammogram. This may be done every 1-2 years. Talk to your health care provider about how often you should have regular mammograms. Talk with your health care provider about your  test results, treatment options, and if necessary, the need for more tests. Vaccines  Your health care provider may recommend certain vaccines, such as:  Influenza vaccine. This is recommended every  year.  Tetanus, diphtheria, and acellular pertussis (Tdap, Td) vaccine. You may need a Td booster every 10 years.  Zoster vaccine. You may need this after age 69.  Pneumococcal 13-valent conjugate (PCV13) vaccine. One dose is recommended after age 17.  Pneumococcal polysaccharide (PPSV23) vaccine. One dose is recommended after age 37. Talk to your health care provider about which screenings and vaccines you need and how often you need them. This information is not intended to replace advice given to you by your health care provider. Make sure you discuss any questions you have with your health care provider. Document Released: 05/25/2015 Document Revised: 01/16/2016 Document Reviewed: 02/27/2015 Elsevier Interactive Patient Education  2017 [employer] Prevention in the Home Falls can cause injuries. They can happen to people of all ages. There are many things you can do to make your home safe and to help prevent falls. What can I do on the outside of my home?  Regularly fix the edges of walkways and driveways and fix any cracks.  Remove anything that might make you trip as you walk through a door, such as a raised step or threshold.  Trim any bushes or trees on the path to your home.  Use bright outdoor lighting.  Clear any walking paths of anything that might make someone trip, such as rocks or tools.  Regularly check to see if handrails are loose or broken. Make sure that both sides of any steps have handrails.  Any raised decks and porches should have guardrails on the edges.  Have any leaves, snow, or ice cleared regularly.  Use sand or salt on walking paths during winter.  Clean up any spills in your garage right away. This includes oil or grease spills. What can I do in the bathroom?  Use night lights.  Install grab bars by the toilet and in the tub and shower. Do not use towel bars as grab bars.  Use non-skid mats or decals in the tub or shower.  If you  need to sit down in the shower, use a plastic, non-slip stool.  Keep the floor dry. Clean up any water that spills on the floor as soon as it happens.  Remove soap buildup in the tub or shower regularly.  Attach bath mats securely with double-sided non-slip rug tape.  Do not have throw rugs and other things on the floor that can make you trip. What can I do in the bedroom?  Use night lights.  Make sure that you have a light by your bed that is easy to reach.  Do not use any sheets or blankets that are too big for your bed. They should not hang down onto the floor.  Have a firm chair that has side arms. You can use this for support while you get dressed.  Do not have throw rugs and other things on the floor that can make you trip. What can I do in the kitchen?  Clean up any spills right away.  Avoid walking on wet floors.  Keep items that you use a lot in easy-to-reach places.  If you need to reach something above you, use a strong step stool that has a grab bar.  Keep electrical cords out of the way.  Do not use floor polish or wax that  makes floors slippery. If you must use wax, use non-skid floor wax.  Do not have throw rugs and other things on the floor that can make you trip. What can I do with my stairs?  Do not leave any items on the stairs.  Make sure that there are handrails on both sides of the stairs and use them. Fix handrails that are broken or loose. Make sure that handrails are as long as the stairways.  Check any carpeting to make sure that it is firmly attached to the stairs. Fix any carpet that is loose or worn.  Avoid having throw rugs at the top or bottom of the stairs. If you do have throw rugs, attach them to the floor with carpet tape.  Make sure that you have a light switch at the top of the stairs and the bottom of the stairs. If you do not have them, ask someone to add them for you. What else can I do to help prevent falls?  Wear shoes  that:  Do not have high heels.  Have rubber bottoms.  Are comfortable and fit you well.  Are closed at the toe. Do not wear sandals.  If you use a stepladder:  Make sure that it is fully opened. Do not climb a closed stepladder.  Make sure that both sides of the stepladder are locked into place.  Ask someone to hold it for you, if possible.  Clearly mark and make sure that you can see:  Any grab bars or handrails.  First and last steps.  Where the edge of each step is.  Use tools that help you move around (mobility aids) if they are needed. These include:  Canes.  Walkers.  Scooters.  Crutches.  Turn on the lights when you go into a dark area. Replace any light bulbs as soon as they burn out.  Set up your furniture so you have a clear path. Avoid moving your furniture around.  If any of your floors are uneven, fix them.  If there are any pets around you, be aware of where they are.  Review your medicines with your doctor. Some medicines can make you feel dizzy. This can increase your chance of falling. Ask your doctor what other things that you can do to help prevent falls. This information is not intended to replace advice given to you by your health care provider. Make sure you discuss any questions you have with your health care provider. Document Released: 02/22/2009 Document Revised: 10/04/2015 Document Reviewed: 06/02/2014 Elsevier Interactive Patient Education  2017 [employer].

## 2019-11-30 NOTE — Progress Notes (Signed)
PCP notes:  Health Maintenance: Colonoscopy- due Dexa- due   Abnormal Screenings: none   Patient concerns: Bronchitis onset 2 weeks ago   Nurse concerns: none   Next PCP appt.: 12/06/2019 @ 10 am

## 2019-11-30 NOTE — Progress Notes (Signed)
Subjective:   Darlene Lawrence is a 79 y.o. female who presents for Medicare Annual (Subsequent) preventive examination.  Review of Systems: N/A     I connected with the patient today by telephone and verified that I am speaking with the correct person using two identifiers. Location patient: home Location nurse: work Persons participating in the virtual visit: patient, Marine scientist.   I discussed the limitations, risks, security and privacy concerns of performing an evaluation and management service by telephone and the availability of in person appointments. I also discussed with the patient that there may be a patient responsible charge related to this service. The patient expressed understanding and verbally consented to this telephonic visit.    Interactive audio and video telecommunications were attempted between this nurse and patient, however failed, due to patient having technical difficulties OR patient did not have access to video capability.  We continued and completed visit with audio only.     Cardiac Risk Factors include: advanced age (>28men, >22 women);hypertension;dyslipidemia     Objective:    Today's Vitals   11/30/19 0942  PainSc: 0-No pain   There is no height or weight on file to calculate BMI.  Advanced Directives 11/30/2019 11/06/2017 11/06/2016 11/03/2016 10/20/2016 09/29/2016 09/24/2016  Does Patient Have a Medical Advance Directive? Yes Yes Yes Yes Yes Yes Yes  Type of Paramedic of [place];Living will [place];Living will [place];Living will [place];Living will [place];Living will [place];Living will [place];Living will  Does patient want to make changes to medical advance directive? - - - - - - No - Patient declined  Copy of [place] in Chart? No - copy requested Yes - Yes - - No - copy  requested  Pre-existing out of facility DNR order (yellow form or pink MOST form) - - - - - - -    Current Medications (verified) Outpatient Encounter Medications as of 11/30/2019  Medication Sig  . albuterol (VENTOLIN HFA) 108 (90 Base) MCG/ACT inhaler Inhale 2 puffs into the lungs every 4 (four) hours as needed for wheezing or shortness of breath. Dispense with aerochamber  . Aspirin (ASPIR-81 PO) Take 1 tablet by mouth.  Marland Kitchen b complex vitamins tablet Take 1 tablet by mouth daily.  . benzonatate (TESSALON PERLES) 100 MG capsule Take 1 capsule (100 mg total) by mouth 3 (three) times daily as needed.  . Calcium Citrate-Vitamin D (CALCIUM CITRATE + PO) Take 1 tablet by mouth daily.  . Cholecalciferol (VITAMIN D3) 2000 UNITS TABS Take 1 capsule by mouth 2 (two) times daily.  . clindamycin (CLEOCIN) 150 MG capsule Take 150 mg by mouth as needed. Takes 4 tablets before a dental procedure  . Coenzyme Q10 (COQ10) 100 MG CAPS Take 1 capsule by mouth daily.  Marland Kitchen doxycycline (VIBRA-TABS) 100 MG tablet Take 1 tablet (100 mg total) by mouth 2 (two) times daily.  . Misc Natural Products (GLUCOSAMINE CHOND MSM FORMULA PO) Take 1,500 mg by mouth.  . Multiple Vitamin (MULTI VITAMIN DAILY PO) Take 1 tablet by mouth.  . Omega-3 Fatty Acids (FISH OIL) 1200 MG CAPS Take 1 capsule by mouth.  . rosuvastatin (CRESTOR) 10 MG tablet Take 10 mg by mouth 2 (two) times a week. Monday, Thursday.  Marland Kitchen Spacer/Aero-Holding Chambers (AEROCHAMBER PLUS) inhaler Use as instructed  . triamterene-hydrochlorothiazide (MAXZIDE-25) 37.5-25 MG tablet Take 0.5 tablets by mouth daily.  Marland Kitchen azithromycin (ZITHROMAX) 250  MG tablet Take 500 mg once, then 250 mg for four days   No facility-administered encounter medications on file as of 11/30/2019.    Allergies (verified) Cefixime, Levaquin [levofloxacin], and Penicillins   History: Past Medical History:  Diagnosis Date  . Abnormal Pap smear   . Arthritis   . breast cancer 2009   right    . Cancer ([place])   . CHF (congestive heart failure) ([place])   . DDD (degenerative disc disease), lumbar   . High cholesterol   . Hyperlipidemia   . Hypertension   . Meniscus tear    right  . Personal history of radiation therapy   . RBBB    Past Surgical History:  Procedure Laterality Date  . BREAST BIOPSY Right 08/18/2007  . BREAST LUMPECTOMY Right 2009  . BREAST SURGERY Right 2009   lumpectomy, no radiation, no chemo  . DILATION AND CURETTAGE OF UTERUS  2012   Dr. Fermin Schwab  . HERNIA REPAIR     umbilical  . JOINT REPLACEMENT Left    hip  . LEFT HEART CATH AND CORONARY ANGIOGRAPHY N/A 09/17/2016   Procedure: Left Heart Cath and Coronary Angiography;  Surgeon: Teodoro Spray, MD;  Location: [facility];  Service: Cardiovascular;  Laterality: N/A;  . TOTAL HIP ARTHROPLASTY Left   . 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 TUBAL LIGATION     Family History  Problem Relation Age of Onset  . Cancer Mother        breast and colon  . Breast cancer Mother   . Hypertension Father   . Heart disease Father   . Thyroid disease Father   . Down syndrome Son   . Asthma Other    Social History   Socioeconomic History  . Marital status: Married    Spouse name: Not on file  . Number of children: 2  . Years of education: Not on file  . Highest education level: Not on file  Occupational History  . 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 Smoking status: Never Smoker  . Smokeless tobacco: Never Used  Vaping Use  . Vaping Use: Never used  Substance and Sexual Activity  . Alcohol use: Yes    Comment: once a month  . Drug use: No  . Sexual activity: Yes    Partners: Male    Birth control/protection: Post-menopausal  Other Topics Concern  . Not on file  Social History Narrative    Regular exercise--yes, curves 3 times a week   Diet-- fruits and veggies, water, no fast food       Social Determinants of Health   Financial Resource Strain: Low Risk   . Difficulty of Paying Living Expenses: Not hard at all  Food Insecurity: No Food Insecurity  . Worried About Charity fundraiser in the Last Year: Never true  . Ran Out of Food in the Last Year: Never true  Transportation Needs: No Transportation Needs  . Lack of Transportation (Medical): No  . Lack of Transportation (Non-Medical): No  Physical Activity: Inactive  . Days of Exercise per Week: 0 days  . Minutes of Exercise per Session: 0 min  Stress: No Stress Concern Present  . Feeling of Stress : Not at all  Social Connections:   . Frequency  of Communication with Friends and Family:   . Frequency of Social Gatherings with Friends and Family:   . Attends Religious Services:   . Active Member of Clubs or Organizations:   . Attends Archivist Meetings:   Marland Kitchen Marital Status:     Tobacco Counseling Counseling given: Not Answered   Clinical Intake:  Pre-visit preparation completed: Yes  Pain : No/denies pain Pain Score: 0-No pain     Nutritional Risks: None Diabetes: No  How often do you need to have someone help you when you read instructions, pamphlets, or other written materials from your doctor or pharmacy?: 1 - Never What is the last grade level you completed in school?: 2 years of college  Diabetic: No Nutrition Risk Assessment:  Has the patient had any N/V/D within the last 2 months?  No  Does the patient have any non-healing wounds?  No  Has the patient had any unintentional weight loss or weight gain?  No   Diabetes:  Is the patient diabetic?  No  If diabetic, was a CBG obtained today?  No  Did the patient bring in their glucometer from home?  No  How often do you monitor your CBG's? N/A.   Financial Strains and Diabetes Management:  Are you having any financial strains with  the device, your supplies or your medication? N/A.  Does the patient want to be seen by Chronic Care Management for management of their diabetes?  N/A Would the patient like to be referred to a Nutritionist or for Diabetic Management?  N/A     Interpreter Needed?: No  Information entered by :: CJohnson, LPN   Activities of Daily Living In your present state of health, do you have any difficulty performing the following activities: 11/30/2019  Hearing? N  Vision? N  Difficulty concentrating or making decisions? N  Walking or climbing stairs? N  Dressing or bathing? N  Doing errands, shopping? N  Preparing Food and eating ? N  Using the Toilet? N  In the past six months, have you accidently leaked urine? Y  Comment does not wear pads  Do you have problems with loss of bowel control? N  Managing your Medications? N  Managing your Finances? N  Housekeeping or managing your Housekeeping? N  Some recent data might be hidden    Patient Care Team: Jinny Sanders, MD as PCP - General Nicholas Lose, MD as Consulting Physician (Hematology and Oncology) Gaynelle Arabian, MD as Consulting Physician (Orthopedic Surgery) Emily Filbert, MD as Consulting Physician (Obstetrics and Gynecology) Lupita Raider, DO as Referring Physician (Optometry) Alisa Graff, FNP as Nurse Practitioner (Family Medicine) Teodoro Spray, MD as Consulting Physician (Cardiology)  Indicate any recent Medical Services you may have received from other than [facility] providers in the past year (date may be approximate).     Assessment:   This is a routine wellness examination for [place].  Hearing/Vision screen  Hearing Screening   125Hz  250Hz  500Hz  1000Hz  2000Hz  3000Hz  4000Hz  6000Hz  8000Hz   Right ear:           Left ear:           Vision Screening Comments: Patient gets annual eye exams   Dietary issues and exercise activities discussed: Current Exercise Habits: The patient does not participate in regular  exercise at present, Exercise limited by: None identified  Goals    . Increase physical activity     When released by cardiologist, I will continue to  go Curves for 30 min 3 days per week in an effort to lose weight.     . Patient Stated     Starting 11/06/2017, I will resume exercising for 30 minutes 3 days per week.     . Patient Stated     11/30/2019, I will maintain and continue medications as prescribed.       Depression Screen PHQ 2/9 Scores 11/30/2019 11/30/2018 11/06/2017 12/25/2016 11/06/2016 11/03/2016 10/20/2016  PHQ - 2 Score 0 0 0 0 0 0 0  PHQ- 9 Score 0 - 0 2 - - 2    Fall Risk Fall Risk  11/30/2019 04/06/2019 11/30/2018 11/06/2017 12/08/2016  Falls in the past year? 0 1 0 No (No Data)  Comment - Emmi Telephone Survey: data to providers prior to load - - No new falls reported today  Number falls in past yr: 0 1 - - -  Comment - Emmi Telephone Survey Actual Response = 1 - - -  Injury with Fall? 0 1 - - -  Risk for fall due to : Medication side effect - - - -  Risk for fall due to: Comment - - - - -  Follow up Falls evaluation completed;Falls prevention discussed - - - -    Any stairs in or around the home? Yes  If so, are there any without handrails? No  Home free of loose throw rugs in walkways, pet beds, electrical cords, etc? Yes  Adequate lighting in your home to reduce risk of falls? Yes   ASSISTIVE DEVICES UTILIZED TO PREVENT FALLS:  Life alert? No  Use of a cane, walker or w/c? No  Grab bars in the bathroom? No  Shower chair or bench in shower? No  Elevated toilet seat or a handicapped toilet? No   TIMED UP AND GO:  Was the test performed? N/A, telephonic visit.    Cognitive Function: MMSE - Mini Mental State Exam 11/30/2019 11/06/2017 11/03/2016 10/11/2015  Orientation to time 5 5 5 5   Orientation to Place 5 5 5 5   Registration 3 3 3 3   Attention/ Calculation 5 0 0 0  Recall 3 3 3 3   Language- name 2 objects - 0 0 0  Language- repeat 1 1 1 1   Language-  follow 3 step command - 3 3 3   Language- read & follow direction - 0 0 0  Write a sentence - 0 0 0  Copy design - 0 0 0  Total score - 20 20 20   Mini Cog  Mini-Cog screen was completed. Maximum score is 22. A value of 0 denotes this part of the MMSE was not completed or the patient failed this part of the Mini-Cog screening.       Immunizations Immunization History  Administered Date(s) Administered  . Influenza Split 02/19/2011, 02/23/2012  . Influenza Whole 02/10/2007, 02/15/2008, 03/06/2010  . Influenza,inj,Quad PF,6+ Mos 02/20/2014, 03/10/2017  . Influenza-Unspecified 02/05/2016, 05/12/2018  . Pneumococcal Conjugate-13 07/04/2014  . Pneumococcal Polysaccharide-23 07/11/2008  . Td 09/09/2000  . Tdap 11/11/2011  . Zoster 11/14/2009    TDAP status: Up to date Flu Vaccine status: Up to date Pneumococcal vaccine status: Up to date Covid-19 vaccine status: Completed vaccines  Qualifies for Shingles Vaccine? Yes   Zostavax completed Yes   Shingrix Completed?: No.    Education has been provided regarding the importance of this vaccine. Patient has been advised to call insurance company to determine out of pocket expense if they have not yet received this  vaccine. Advised may also receive vaccine at local pharmacy or Health Dept. Verbalized acceptance and understanding.  Screening Tests Health Maintenance  Topic Date Due  . Hepatitis C Screening  Never done  . COVID-19 Vaccine (1) Never done  . COLONOSCOPY  10/11/2018  . INFLUENZA VACCINE  12/11/2019  . TETANUS/TDAP  11/10/2021  . DEXA SCAN  Completed  . PNA vac Low Risk Adult  Completed    Health Maintenance  Health Maintenance Due  Topic Date Due  . Hepatitis C Screening  Never done  . COVID-19 Vaccine (1) Never done  . COLONOSCOPY  10/11/2018    {Colorectal cancer screening: due, will discuss with provider first at next visit  Mammogram status: Completed 11/02/2019. Repeat every year {Bone Density status: due,  will discuss with provider first at next visit  Lung Cancer Screening: (Low Dose CT Chest recommended if Age 47-80 years, 30 pack-year currently smoking OR have quit w/in 15years.) does not qualify.     Additional Screening:  Hepatitis C Screening: does qualify; Completed due  Vision Screening: Recommended annual ophthalmology exams for early detection of glaucoma and other disorders of the eye. Is the patient up to date with their annual eye exam?  Yes  Who is the provider or what is the name of the office in which the patient attends annual eye exams? [facility] If pt is not established with a provider, would they like to be referred to a provider to establish care? No .   Dental Screening: Recommended annual dental exams for proper oral hygiene  Community Resource Referral / Chronic Care Management: CRR required this visit?  No   CCM required this visit?  No      Plan:     I have personally reviewed and noted the following in the patient's chart:   . Medical and social history . Use of alcohol, tobacco or illicit drugs  . Current medications and supplements . Functional ability and status . Nutritional status . Physical activity . Advanced directives . List of other physicians . Hospitalizations, surgeries, and ER visits in previous 12 months . Vitals . Screenings to include cognitive, depression, and falls . Referrals and appointments  In addition, I have reviewed and discussed with patient certain preventive protocols, quality metrics, and best practice recommendations. A written personalized care plan for preventive services as well as general preventive health recommendations were provided to patient.   Due to this being a telephonic visit, the after visit summary with patients personalized plan was offered to patient via mail or my-chart. Patient preferred to pick up at office at next visit.   Andrez Grime, LPN   [id number]

## 2019-12-02 ENCOUNTER — Telehealth: Payer: Self-pay | Admitting: Family Medicine

## 2019-12-02 DIAGNOSIS — E782 Mixed hyperlipidemia: Secondary | ICD-10-CM

## 2019-12-02 DIAGNOSIS — D696 Thrombocytopenia, unspecified: Secondary | ICD-10-CM

## 2019-12-02 NOTE — Telephone Encounter (Signed)
-----   Message from Cloyd Stagers, RT sent at 11/28/2019  1:51 PM EDT ----- Regarding: Lab Orders for Monday 7.26.2021 Please place lab orders for Monday 7.26.2021, office visit for physical on Tuesday 7.27.2021 Thank you, Dyke Maes RT(R)

## 2019-12-05 ENCOUNTER — Other Ambulatory Visit: Payer: Self-pay

## 2019-12-05 ENCOUNTER — Other Ambulatory Visit (INDEPENDENT_AMBULATORY_CARE_PROVIDER_SITE_OTHER): Payer: Medicare Other

## 2019-12-05 DIAGNOSIS — M5136 Other intervertebral disc degeneration, lumbar region: Secondary | ICD-10-CM | POA: Diagnosis not present

## 2019-12-05 DIAGNOSIS — Z1159 Encounter for screening for other viral diseases: Secondary | ICD-10-CM | POA: Diagnosis not present

## 2019-12-05 DIAGNOSIS — M9901 Segmental and somatic dysfunction of cervical region: Secondary | ICD-10-CM | POA: Diagnosis not present

## 2019-12-05 DIAGNOSIS — M5033 Other cervical disc degeneration, cervicothoracic region: Secondary | ICD-10-CM | POA: Diagnosis not present

## 2019-12-05 DIAGNOSIS — M9903 Segmental and somatic dysfunction of lumbar region: Secondary | ICD-10-CM | POA: Diagnosis not present

## 2019-12-05 DIAGNOSIS — E782 Mixed hyperlipidemia: Secondary | ICD-10-CM

## 2019-12-05 LAB — COMPREHENSIVE METABOLIC PANEL
ALT: 20 U/L (ref 0–35)
AST: 18 U/L (ref 0–37)
Albumin: 3.9 g/dL (ref 3.5–5.2)
Alkaline Phosphatase: 56 U/L (ref 39–117)
BUN: 27 mg/dL — ABNORMAL HIGH (ref 6–23)
CO2: 28 mEq/L (ref 19–32)
Calcium: 9.5 mg/dL (ref 8.4–10.5)
Chloride: 103 mEq/L (ref 96–112)
Creatinine, Ser: 1.22 mg/dL — ABNORMAL HIGH (ref 0.40–1.20)
GFR: 42.72 mL/min — ABNORMAL LOW (ref >60.00)
Glucose, Bld: 103 mg/dL — ABNORMAL HIGH (ref 70–99)
Potassium: 4.1 mEq/L (ref 3.5–5.1)
Sodium: 139 mEq/L (ref 135–145)
Total Bilirubin: 0.6 mg/dL (ref 0.2–1.2)
Total Protein: 7.4 g/dL (ref 6.0–8.3)

## 2019-12-05 LAB — LIPID PANEL
Cholesterol: 132 mg/dL (ref 0–200)
HDL: 42.1 mg/dL (ref >39.00)
LDL Cholesterol: 68 mg/dL (ref 0–99)
NonHDL: 89.91
Total CHOL/HDL Ratio: 3
Triglycerides: 111 mg/dL (ref 0.0–149.0)
VLDL: 22.2 mg/dL (ref 0.0–40.0)

## 2019-12-06 ENCOUNTER — Ambulatory Visit (INDEPENDENT_AMBULATORY_CARE_PROVIDER_SITE_OTHER): Payer: Medicare Other | Admitting: Family Medicine

## 2019-12-06 ENCOUNTER — Encounter: Payer: Self-pay | Admitting: Family Medicine

## 2019-12-06 VITALS — BP 130/80 | HR 69 | Temp 98.0°F | Ht 64.5 in | Wt 218.0 lb

## 2019-12-06 DIAGNOSIS — I5033 Acute on chronic diastolic (congestive) heart failure: Secondary | ICD-10-CM | POA: Diagnosis not present

## 2019-12-06 DIAGNOSIS — E782 Mixed hyperlipidemia: Secondary | ICD-10-CM

## 2019-12-06 DIAGNOSIS — H938X2 Other specified disorders of left ear: Secondary | ICD-10-CM

## 2019-12-06 DIAGNOSIS — N1831 Chronic kidney disease, stage 3a: Secondary | ICD-10-CM | POA: Diagnosis not present

## 2019-12-06 DIAGNOSIS — I10 Essential (primary) hypertension: Secondary | ICD-10-CM

## 2019-12-06 DIAGNOSIS — Z1159 Encounter for screening for other viral diseases: Secondary | ICD-10-CM

## 2019-12-06 LAB — MICROALBUMIN / CREATININE URINE RATIO
Creatinine,U: 44.9 mg/dL
Microalb Creat Ratio: 1.6 mg/g (ref 0.0–30.0)
Microalb, Ur: <0.7 mg/dL (ref 0.0–1.9)

## 2019-12-06 LAB — HEPATITIS C ANTIBODY
Hepatitis C Ab: NONREACTIVE
SIGNAL TO CUT-OFF: 0.02 (ref <1.00)

## 2019-12-06 NOTE — Assessment & Plan Note (Signed)
Euvolemic. Risk factor control.

## 2019-12-06 NOTE — Assessment & Plan Note (Signed)
At goal on crestor. 

## 2019-12-06 NOTE — Assessment & Plan Note (Signed)
Well controlled. Continue current medication.  

## 2019-12-06 NOTE — Patient Instructions (Addendum)
Set colonoscopy when you are able to.  Get shingrix at pharmacy.  Call the office next year when schedule mammogram so we can set up Bone Density.  Stop at lab on way out for hep C test and urine test.  Start flonase 2 sprays per nostril daily.   Rest!

## 2019-12-06 NOTE — Progress Notes (Signed)
No critical labs need to be addressed urgently. We will discuss labs in detail at upcoming office visit.   

## 2019-12-06 NOTE — Assessment & Plan Note (Signed)
ETD .. treat with flonase.

## 2019-12-06 NOTE — Progress Notes (Addendum)
No chief complaint on file.   History of Present Illness: HPI  The patient presents for  review of chronic health problems. He/She also has the following acute concerns today: She still is having issues with congestion and  Left ear clogged.  No SOB, still mild cough.  No PND.  No fever. Started on 11/14/2019 Ladona Ridgel, Z-Pak Saw Urgent care on 7/13: CXR was negative..  Given Augmentin with epi pen as she has allergy to.  I gave doxy to take in place.  On last day of doxycycline 10 day course She has some more energy.  She is still pretty tired.  The patient saw a LPN or RN for medicare wellness visit.  Prevention and wellness was reviewed in detail. Note reviewed and important notes copied below. Health Maintenance: Colonoscopy- due Dexa- due   Abnormal Screenings: none   Patient concerns: Bronchitis onset 2 weeks ago  12/06/19  Elevated Cholesterol: Lab Results  Component Value Date   CHOL 132 12/05/2019   HDL 42.10 12/05/2019   LDLCALC 68 12/05/2019   TRIG 111.0 12/05/2019   CHOLHDL 3 12/05/2019  Using medications without problems: Muscle aches:  Diet compliance: good Exercise: none now Other complaints:  Hypertension:  At goal on triamterene HCTZ. BP Readings from Last 3 Encounters:  12/06/19 (!) 130/80  11/22/19 118/72  11/21/19 127/72  Using medication without problems or lightheadedness:  none Chest pain with exertion:none Edema:none Short of breath:none Average home BPs: Other issues:  HFpEF: no fluid oerload     This visit occurred during the SARS-CoV-2 public health emergency.  Safety protocols were in place, including screening questions prior to the visit, additional usage of staff PPE, and extensive cleaning of exam room while observing appropriate contact time as indicated for disinfecting solutions.   COVID 19 screen:  No recent travel or known exposure to COVID19 The patient denies respiratory symptoms of COVID 19 at this  time. The importance of social distancing was discussed today.     ROS    Past Medical History:  Diagnosis Date  . Abnormal Pap smear   . Arthritis   . breast cancer 2009   right  . Cancer ([place])   . CHF (congestive heart failure) ([place])   . DDD (degenerative disc disease), lumbar   . High cholesterol   . Hyperlipidemia   . Hypertension   . Meniscus tear    right  . Personal history of radiation therapy   . RBBB     reports that she has never smoked. She has never used smokeless tobacco. She reports current alcohol use. She reports that she does not use drugs.   Current Outpatient Medications:  .  Aspirin (ASPIR-81 PO), Take 1 tablet by mouth., Disp: , Rfl:  .  b complex vitamins tablet, Take 1 tablet by mouth daily., Disp: , Rfl:  .  Calcium Citrate-Vitamin D (CALCIUM CITRATE + PO), Take 1 tablet by mouth daily., Disp: , Rfl:  .  Cholecalciferol (VITAMIN D3) 2000 UNITS TABS, Take 1 capsule by mouth 2 (two) times daily., Disp: , Rfl:  .  clindamycin (CLEOCIN) 150 MG capsule, Take 150 mg by mouth as needed. Takes 4 tablets before a dental procedure, Disp: , Rfl:  .  Coenzyme Q10 (COQ10) 100 MG CAPS, Take 1 capsule by mouth daily., Disp: , Rfl:  .  Misc Natural Products (GLUCOSAMINE CHOND MSM FORMULA PO), Take 1,500 mg by mouth., Disp: , Rfl:  .  Multiple Vitamin (MULTI VITAMIN DAILY PO), Take  1 tablet by mouth., Disp: , Rfl:  .  Omega-3 Fatty Acids (FISH OIL) 1200 MG CAPS, Take 1 capsule by mouth., Disp: , Rfl:  .  rosuvastatin (CRESTOR) 10 MG tablet, Take 10 mg by mouth every Monday, Wednesday, and Friday., Disp: , Rfl:  .  triamterene-hydrochlorothiazide (MAXZIDE-25) 37.5-25 MG tablet, Take 0.5 tablets by mouth daily., Disp: 45 tablet, Rfl: 1   Observations/Objective: Blood pressure (!) 130/80, pulse 69, temperature 98 F (36.7 C), temperature source Temporal, height 5' 4.5" (1.638 m), weight (!) 218 lb (98.9 kg), SpO2 97 %.  Physical Exam Constitutional:      General: She  is not in acute distress.    Appearance: Normal appearance. She is well-developed. She is not ill-appearing or toxic-appearing.  HENT:     Head: Normocephalic.     Right Ear: Hearing, tympanic membrane, ear canal and external ear normal.     Left Ear: Hearing, ear canal and external ear normal. A middle ear effusion is present. Tympanic membrane is not erythematous.     Nose: Congestion present.  Eyes:     General: Lids are normal. Lids are everted, no foreign bodies appreciated.     Conjunctiva/sclera: Conjunctivae normal.     Pupils: Pupils are equal, round, and reactive to light.  Neck:     Thyroid: No thyroid mass or thyromegaly.     Vascular: No carotid bruit.     Trachea: Trachea normal.  Cardiovascular:     Rate and Rhythm: Normal rate and regular rhythm.     Heart sounds: Normal heart sounds, S1 normal and S2 normal. No murmur heard.  No gallop.   Pulmonary:     Effort: Pulmonary effort is normal. No respiratory distress.     Breath sounds: Normal breath sounds. No wheezing, rhonchi or rales.  Abdominal:     General: Bowel sounds are normal. There is no distension or abdominal bruit.     Palpations: Abdomen is soft. There is no fluid wave or mass.     Tenderness: There is no abdominal tenderness. There is no guarding or rebound.     Hernia: No hernia is present.  Musculoskeletal:     Cervical back: Normal range of motion and neck supple.  Lymphadenopathy:     Cervical: No cervical adenopathy.  Skin:    General: Skin is warm and dry.     Findings: No rash.  Neurological:     Mental Status: She is alert.     Cranial Nerves: No cranial nerve deficit.     Sensory: No sensory deficit.  Psychiatric:        Mood and Affect: Mood is not anxious or depressed.        Speech: Speech normal.        Behavior: Behavior normal. Behavior is cooperative.        Judgment: Judgment normal.      Assessment and Plan The patient's preventative maintenance and recommended screening  tests for an annual wellness exam were reviewed in full today. Brought up to date unless services declined.  Counselled on the importance of diet, exercise, and its role in overall health and mortality. The patient's FH and SH was reviewed, including their home life, tobacco status, and drug and alcohol status.   Vaccines: Uptodate . S/P COVID19 vaccine PAP/DVE: Per Dr. Hulan Fray. Following uterine prolapse. Decided to not move ahead with hysterectomy given SE risks. Breast exam/mammogram:10/2019 nml, hx of breast cancer 2009 Colonoscopy: Dr. Cristina Gong 10/2013 polyps and fmaily history  of colon cancer, repeat in 5 years... DUE Nonsmoker  DEXA:osteopeniain 2016, repeat in2-5 years. Discussed Hep C testing.   Heart failure with preserved ejection fraction (HCC) Euvolemic. Risk factor control.  CKD (chronic kidney disease) stage 3, GFR 30-59 ml/min Stable control Estimated Creatinine Clearance: 44.6 mL/min (A) (by C-G formula based on SCr of 1.22 mg/dL (H)).   Hyperlipidemia  At goal on crestor.  Essential hypertension Well controlled. Continue current medication.   Ear fullness, left ETD .. treat with flonase.    Eliezer Lofts, MD

## 2019-12-06 NOTE — Assessment & Plan Note (Signed)
Stable control Estimated Creatinine Clearance: 44.6 mL/min (A) (by C-G formula based on SCr of 1.22 mg/dL (H)).

## 2019-12-07 LAB — HEPATITIS C ANTIBODY
Hepatitis C Ab: NONREACTIVE
SIGNAL TO CUT-OFF: 0.02 (ref <1.00)

## 2019-12-20 ENCOUNTER — Telehealth: Payer: Self-pay | Admitting: *Deleted

## 2019-12-20 NOTE — Telephone Encounter (Signed)
Patient called stating that she started with vertigo this morning. Patient stated that she has had vertigo years ago. Patient stated that she feels like the room is spinning. Patient stated that she has been vomiting. Patient denies chest pain, SOB or difficulty breathing. Patient wants to know what she should do? Pharmacy [facility]

## 2019-12-20 NOTE — Telephone Encounter (Signed)
She can use OTC meclizine for vertigo ( will make her sleepy) and have her make appt if not resolving in next 24 -48 hours.

## 2019-12-20 NOTE — Telephone Encounter (Signed)
Patient notified as instructed by telephone and verbalized understanding. 

## 2020-01-02 DIAGNOSIS — M5033 Other cervical disc degeneration, cervicothoracic region: Secondary | ICD-10-CM | POA: Diagnosis not present

## 2020-01-02 DIAGNOSIS — M9903 Segmental and somatic dysfunction of lumbar region: Secondary | ICD-10-CM | POA: Diagnosis not present

## 2020-01-02 DIAGNOSIS — M9901 Segmental and somatic dysfunction of cervical region: Secondary | ICD-10-CM | POA: Diagnosis not present

## 2020-01-02 DIAGNOSIS — M5136 Other intervertebral disc degeneration, lumbar region: Secondary | ICD-10-CM | POA: Diagnosis not present

## 2020-01-30 DIAGNOSIS — M5033 Other cervical disc degeneration, cervicothoracic region: Secondary | ICD-10-CM | POA: Diagnosis not present

## 2020-01-30 DIAGNOSIS — M9901 Segmental and somatic dysfunction of cervical region: Secondary | ICD-10-CM | POA: Diagnosis not present

## 2020-01-30 DIAGNOSIS — M5136 Other intervertebral disc degeneration, lumbar region: Secondary | ICD-10-CM | POA: Diagnosis not present

## 2020-01-30 DIAGNOSIS — M9903 Segmental and somatic dysfunction of lumbar region: Secondary | ICD-10-CM | POA: Diagnosis not present

## 2020-02-01 DIAGNOSIS — H2513 Age-related nuclear cataract, bilateral: Secondary | ICD-10-CM | POA: Diagnosis not present

## 2020-02-20 DIAGNOSIS — Z23 Encounter for immunization: Secondary | ICD-10-CM | POA: Diagnosis not present

## 2020-02-20 DIAGNOSIS — M9903 Segmental and somatic dysfunction of lumbar region: Secondary | ICD-10-CM | POA: Diagnosis not present

## 2020-02-20 DIAGNOSIS — M5136 Other intervertebral disc degeneration, lumbar region: Secondary | ICD-10-CM | POA: Diagnosis not present

## 2020-02-20 DIAGNOSIS — M9901 Segmental and somatic dysfunction of cervical region: Secondary | ICD-10-CM | POA: Diagnosis not present

## 2020-02-20 DIAGNOSIS — M5033 Other cervical disc degeneration, cervicothoracic region: Secondary | ICD-10-CM | POA: Diagnosis not present

## 2020-03-19 DIAGNOSIS — M5033 Other cervical disc degeneration, cervicothoracic region: Secondary | ICD-10-CM | POA: Diagnosis not present

## 2020-03-19 DIAGNOSIS — M9901 Segmental and somatic dysfunction of cervical region: Secondary | ICD-10-CM | POA: Diagnosis not present

## 2020-03-19 DIAGNOSIS — M5136 Other intervertebral disc degeneration, lumbar region: Secondary | ICD-10-CM | POA: Diagnosis not present

## 2020-03-19 DIAGNOSIS — M9903 Segmental and somatic dysfunction of lumbar region: Secondary | ICD-10-CM | POA: Diagnosis not present

## 2020-03-22 DIAGNOSIS — H8111 Benign paroxysmal vertigo, right ear: Secondary | ICD-10-CM | POA: Diagnosis not present

## 2020-03-22 DIAGNOSIS — H6982 Other specified disorders of Eustachian tube, left ear: Secondary | ICD-10-CM | POA: Diagnosis not present

## 2020-03-22 DIAGNOSIS — H903 Sensorineural hearing loss, bilateral: Secondary | ICD-10-CM | POA: Diagnosis not present

## 2020-03-22 DIAGNOSIS — H8110 Benign paroxysmal vertigo, unspecified ear: Secondary | ICD-10-CM | POA: Diagnosis not present

## 2020-03-22 DIAGNOSIS — R42 Dizziness and giddiness: Secondary | ICD-10-CM | POA: Diagnosis not present

## 2020-04-09 DIAGNOSIS — H6982 Other specified disorders of Eustachian tube, left ear: Secondary | ICD-10-CM | POA: Diagnosis not present

## 2020-04-09 DIAGNOSIS — H903 Sensorineural hearing loss, bilateral: Secondary | ICD-10-CM | POA: Diagnosis not present

## 2020-04-09 DIAGNOSIS — R42 Dizziness and giddiness: Secondary | ICD-10-CM | POA: Diagnosis not present

## 2020-04-12 DIAGNOSIS — Z20828 Contact with and (suspected) exposure to other viral communicable diseases: Secondary | ICD-10-CM | POA: Diagnosis not present

## 2020-04-16 DIAGNOSIS — M5136 Other intervertebral disc degeneration, lumbar region: Secondary | ICD-10-CM | POA: Diagnosis not present

## 2020-04-16 DIAGNOSIS — M5033 Other cervical disc degeneration, cervicothoracic region: Secondary | ICD-10-CM | POA: Diagnosis not present

## 2020-04-16 DIAGNOSIS — M9901 Segmental and somatic dysfunction of cervical region: Secondary | ICD-10-CM | POA: Diagnosis not present

## 2020-04-16 DIAGNOSIS — M9903 Segmental and somatic dysfunction of lumbar region: Secondary | ICD-10-CM | POA: Diagnosis not present

## 2020-04-30 ENCOUNTER — Encounter: Payer: Self-pay | Admitting: Radiology

## 2020-05-03 ENCOUNTER — Other Ambulatory Visit: Payer: Self-pay | Admitting: Family Medicine

## 2020-05-14 DIAGNOSIS — M9901 Segmental and somatic dysfunction of cervical region: Secondary | ICD-10-CM | POA: Diagnosis not present

## 2020-05-14 DIAGNOSIS — M5136 Other intervertebral disc degeneration, lumbar region: Secondary | ICD-10-CM | POA: Diagnosis not present

## 2020-05-14 DIAGNOSIS — M5033 Other cervical disc degeneration, cervicothoracic region: Secondary | ICD-10-CM | POA: Diagnosis not present

## 2020-05-14 DIAGNOSIS — M9903 Segmental and somatic dysfunction of lumbar region: Secondary | ICD-10-CM | POA: Diagnosis not present

## 2020-06-04 ENCOUNTER — Telehealth: Payer: Self-pay | Admitting: *Deleted

## 2020-06-04 NOTE — Telephone Encounter (Signed)
Kristianna dropped off Handicap Placard Application.  Form placed in Dr. Rometta Emery office in box to complete.

## 2020-06-08 NOTE — Telephone Encounter (Signed)
Done, in outbox.

## 2020-06-11 DIAGNOSIS — M9901 Segmental and somatic dysfunction of cervical region: Secondary | ICD-10-CM | POA: Diagnosis not present

## 2020-06-11 DIAGNOSIS — M5033 Other cervical disc degeneration, cervicothoracic region: Secondary | ICD-10-CM | POA: Diagnosis not present

## 2020-06-11 DIAGNOSIS — M5136 Other intervertebral disc degeneration, lumbar region: Secondary | ICD-10-CM | POA: Diagnosis not present

## 2020-06-11 DIAGNOSIS — M9903 Segmental and somatic dysfunction of lumbar region: Secondary | ICD-10-CM | POA: Diagnosis not present

## 2020-06-11 NOTE — Telephone Encounter (Signed)
Completed form given to Emelia Salisbury to deliver to patient.

## 2020-07-09 DIAGNOSIS — M5033 Other cervical disc degeneration, cervicothoracic region: Secondary | ICD-10-CM | POA: Diagnosis not present

## 2020-07-09 DIAGNOSIS — M9901 Segmental and somatic dysfunction of cervical region: Secondary | ICD-10-CM | POA: Diagnosis not present

## 2020-07-09 DIAGNOSIS — M5136 Other intervertebral disc degeneration, lumbar region: Secondary | ICD-10-CM | POA: Diagnosis not present

## 2020-07-09 DIAGNOSIS — M9903 Segmental and somatic dysfunction of lumbar region: Secondary | ICD-10-CM | POA: Diagnosis not present

## 2020-08-07 DIAGNOSIS — M5136 Other intervertebral disc degeneration, lumbar region: Secondary | ICD-10-CM | POA: Diagnosis not present

## 2020-08-07 DIAGNOSIS — M9903 Segmental and somatic dysfunction of lumbar region: Secondary | ICD-10-CM | POA: Diagnosis not present

## 2020-08-07 DIAGNOSIS — M9901 Segmental and somatic dysfunction of cervical region: Secondary | ICD-10-CM | POA: Diagnosis not present

## 2020-08-07 DIAGNOSIS — M5033 Other cervical disc degeneration, cervicothoracic region: Secondary | ICD-10-CM | POA: Diagnosis not present

## 2020-08-29 ENCOUNTER — Other Ambulatory Visit: Payer: Self-pay

## 2020-08-29 ENCOUNTER — Encounter: Payer: Self-pay | Admitting: Family Medicine

## 2020-08-29 ENCOUNTER — Ambulatory Visit (INDEPENDENT_AMBULATORY_CARE_PROVIDER_SITE_OTHER): Payer: Medicare Other | Admitting: Family Medicine

## 2020-08-29 VITALS — BP 125/79 | HR 68 | Ht 65.0 in | Wt 222.0 lb

## 2020-08-29 DIAGNOSIS — N8110 Cystocele, unspecified: Secondary | ICD-10-CM | POA: Diagnosis not present

## 2020-08-29 DIAGNOSIS — Z01411 Encounter for gynecological examination (general) (routine) with abnormal findings: Secondary | ICD-10-CM

## 2020-08-29 DIAGNOSIS — Z1231 Encounter for screening mammogram for malignant neoplasm of breast: Secondary | ICD-10-CM

## 2020-08-29 DIAGNOSIS — Z01419 Encounter for gynecological examination (general) (routine) without abnormal findings: Secondary | ICD-10-CM

## 2020-08-29 NOTE — Progress Notes (Signed)
   GYNECOLOGY PROBLEM  VISIT ENCOUNTER NOTE  Subjective:   Darlene Lawrence is a 80 y.o. [phone] female here for a a problem GYN visit.  Current complaints: persistent cystocoele, denies irritation or frequent UTI (last UTI was "many years ago").   Denies abnormal vaginal bleeding, discharge, pelvic pain, problems with intercourse or other gynecologic concerns.    Gynecologic History No LMP recorded (lmp unknown). Patient is postmenopausal. Contraception: post menopausal status  Health Maintenance Due  Topic Date Due  . COLONOSCOPY (Pts 45-14yrs Insurance coverage will need to be confirmed)  10/11/2018  . COVID-19 Vaccine (3 - Booster for Pfizer series) 12/22/2019    The following portions of the patient's history were reviewed and updated as appropriate: allergies, current medications, past family history, past medical history, past social history, past surgical history and problem list.  Review of Systems Pertinent items are noted in HPI.   Objective:  BP 125/79   Pulse 68   Ht 5\' 5"  (1.651 m)   Wt 222 lb (100.7 kg)   LMP  (LMP Unknown)   BMI 36.94 kg/m  Gen: well appearing, NAD HEENT: no scleral icterus CV: RR Lung: Normal WOB Ext: warm well perfused Breast: symmetric. No nipple discharge. Has hyperpigmented seborrheic keratosis on left breast that is stable  PELVIC: Normal appearing external genitalia; normal appearing vaginal mucosa and cervix.  No abnormal discharge noted.  Pap not indicated.  + large protruding cystocele/bladder prolapse. Able to reduce easily Normal uterine size, no other palpable masses, no uterine or adnexal tenderness.   Assessment and Plan:   1. Well woman exam with routine gynecological exam Pap up to date, no need for further paps  Counseled on need for CRC screening- Scheduled for June 2022 Reviewed having regular mammograms- ordered mammogram today  2. Bladder prolapse - Had work up for surgical repair but was worried about permanent  cathtererization after procedure and then did not follow up - Denies changes in sx. No irritation, no increased UTIs recently.  - may be interested in pessary  Please refer to After Visit Summary for other counseling recommendations.   No follow-ups on file.  Caren Macadam, MD, MPH, ABFM Attending [place] for [facility]

## 2020-09-05 DIAGNOSIS — M9901 Segmental and somatic dysfunction of cervical region: Secondary | ICD-10-CM | POA: Diagnosis not present

## 2020-09-05 DIAGNOSIS — M5033 Other cervical disc degeneration, cervicothoracic region: Secondary | ICD-10-CM | POA: Diagnosis not present

## 2020-09-05 DIAGNOSIS — M5136 Other intervertebral disc degeneration, lumbar region: Secondary | ICD-10-CM | POA: Diagnosis not present

## 2020-09-05 DIAGNOSIS — M9903 Segmental and somatic dysfunction of lumbar region: Secondary | ICD-10-CM | POA: Diagnosis not present

## 2020-09-18 DIAGNOSIS — D225 Melanocytic nevi of trunk: Secondary | ICD-10-CM | POA: Diagnosis not present

## 2020-09-19 DIAGNOSIS — E2839 Other primary ovarian failure: Secondary | ICD-10-CM

## 2020-10-01 DIAGNOSIS — M9901 Segmental and somatic dysfunction of cervical region: Secondary | ICD-10-CM | POA: Diagnosis not present

## 2020-10-01 DIAGNOSIS — M5033 Other cervical disc degeneration, cervicothoracic region: Secondary | ICD-10-CM | POA: Diagnosis not present

## 2020-10-01 DIAGNOSIS — M9903 Segmental and somatic dysfunction of lumbar region: Secondary | ICD-10-CM | POA: Diagnosis not present

## 2020-10-01 DIAGNOSIS — M5136 Other intervertebral disc degeneration, lumbar region: Secondary | ICD-10-CM | POA: Diagnosis not present

## 2020-10-29 DIAGNOSIS — M9903 Segmental and somatic dysfunction of lumbar region: Secondary | ICD-10-CM | POA: Diagnosis not present

## 2020-10-29 DIAGNOSIS — M9901 Segmental and somatic dysfunction of cervical region: Secondary | ICD-10-CM | POA: Diagnosis not present

## 2020-10-29 DIAGNOSIS — M5033 Other cervical disc degeneration, cervicothoracic region: Secondary | ICD-10-CM | POA: Diagnosis not present

## 2020-10-29 DIAGNOSIS — M5136 Other intervertebral disc degeneration, lumbar region: Secondary | ICD-10-CM | POA: Diagnosis not present

## 2020-11-13 ENCOUNTER — Other Ambulatory Visit: Payer: Self-pay

## 2020-11-13 ENCOUNTER — Ambulatory Visit
Admission: RE | Admit: 2020-11-13 | Discharge: 2020-11-13 | Disposition: A | Discharge status: Home / Self Care | Payer: Medicare Other | Source: Ambulatory Visit | Attending: Family Medicine | Admitting: Family Medicine

## 2020-11-13 DIAGNOSIS — Z20822 Contact with and (suspected) exposure to covid-19: Secondary | ICD-10-CM | POA: Diagnosis not present

## 2020-11-13 DIAGNOSIS — Z1231 Encounter for screening mammogram for malignant neoplasm of breast: Secondary | ICD-10-CM | POA: Diagnosis not present

## 2020-11-13 DIAGNOSIS — Z01419 Encounter for gynecological examination (general) (routine) without abnormal findings: Secondary | ICD-10-CM

## 2020-11-16 ENCOUNTER — Other Ambulatory Visit: Payer: Self-pay | Admitting: Family Medicine

## 2020-11-16 DIAGNOSIS — R928 Other abnormal and inconclusive findings on diagnostic imaging of breast: Secondary | ICD-10-CM

## 2020-11-25 ENCOUNTER — Other Ambulatory Visit: Payer: Self-pay | Admitting: Family Medicine

## 2020-11-26 DIAGNOSIS — M9901 Segmental and somatic dysfunction of cervical region: Secondary | ICD-10-CM | POA: Diagnosis not present

## 2020-11-26 DIAGNOSIS — M5033 Other cervical disc degeneration, cervicothoracic region: Secondary | ICD-10-CM | POA: Diagnosis not present

## 2020-11-26 DIAGNOSIS — M5136 Other intervertebral disc degeneration, lumbar region: Secondary | ICD-10-CM | POA: Diagnosis not present

## 2020-11-26 DIAGNOSIS — M9903 Segmental and somatic dysfunction of lumbar region: Secondary | ICD-10-CM | POA: Diagnosis not present

## 2020-12-04 DIAGNOSIS — Z800 Family history of malignant neoplasm of digestive organs: Secondary | ICD-10-CM | POA: Diagnosis not present

## 2020-12-04 DIAGNOSIS — Z86010 Personal history of colonic polyps: Secondary | ICD-10-CM | POA: Diagnosis not present

## 2020-12-04 DIAGNOSIS — D123 Benign neoplasm of transverse colon: Secondary | ICD-10-CM | POA: Diagnosis not present

## 2020-12-04 LAB — HM COLONOSCOPY

## 2020-12-06 ENCOUNTER — Other Ambulatory Visit: Payer: Medicare Other

## 2020-12-07 DIAGNOSIS — D123 Benign neoplasm of transverse colon: Secondary | ICD-10-CM | POA: Diagnosis not present

## 2020-12-09 ENCOUNTER — Telehealth: Payer: Self-pay | Admitting: Family Medicine

## 2020-12-09 DIAGNOSIS — I10 Essential (primary) hypertension: Secondary | ICD-10-CM

## 2020-12-09 NOTE — Telephone Encounter (Signed)
-----   Message from Ellamae Sia sent at 11/26/2020  9:32 AM EDT ----- Regarding: Lab orders for Monday, 8.1.22 Patient is scheduled for CPX labs, please order future labs, Thanks , Karna Christmas

## 2020-12-09 NOTE — Telephone Encounter (Signed)
-----   Message from Cloyd Stagers, RT sent at 12/03/2020  2:53 PM EDT ----- Regarding: Lab Orders for Tuesday 8.2.2022 Please place lab orders for Tuesday 8.2.2022, office visit for physical on Thursday 8.4.2022 Thank you, Dyke Maes RT(R)

## 2020-12-10 ENCOUNTER — Other Ambulatory Visit: Payer: Medicare Other

## 2020-12-10 ENCOUNTER — Ambulatory Visit (INDEPENDENT_AMBULATORY_CARE_PROVIDER_SITE_OTHER): Payer: Medicare Other

## 2020-12-10 DIAGNOSIS — Z0000 Encounter for general adult medical examination without abnormal findings: Secondary | ICD-10-CM | POA: Diagnosis not present

## 2020-12-10 NOTE — Patient Instructions (Signed)
[name] , Thank you for taking time to come for your Medicare Wellness Visit. I appreciate your ongoing commitment to your health goals. Please review the following plan we discussed and let me know if I can assist you in the future.   Screening recommendations/referrals: Colonoscopy: Up to date, completed 12/03/2020, no longer required  Mammogram: Up to date, completed 11/13/2020, due 11/2021 Bone Density: scheduled 03/21/2021 Recommended yearly ophthalmology/optometry visit for glaucoma screening and checkup Recommended yearly dental visit for hygiene and checkup  Vaccinations: Influenza vaccine: Up to date, completed 02/20/2020, due 02/2021 Pneumococcal vaccine: Completed series Tdap vaccine: Up to date, completed 11/11/2011, due 11/2021 Shingles vaccine: due, check with your insurance regarding coverage if interested    Covid-19:completed 2 vaccines   Advanced directives: Please bring a copy of your POA (Power of Attorney) and/or Living Will to your next appointment.   Conditions/risks identified: hypertension, hyperlipidemia   Next appointment: Follow up in one year for your annual wellness visit    Preventive Care 32 Years and Older, Female Preventive care refers to lifestyle choices and visits with your health care provider that can promote health and wellness. What does preventive care include? A yearly physical exam. This is also called an annual well check. Dental exams once or twice a year. Routine eye exams. Ask your health care provider how often you should have your eyes checked. Personal lifestyle choices, including: Daily care of your teeth and gums. Regular physical activity. Eating a healthy diet. Avoiding tobacco and drug use. Limiting alcohol use. Practicing safe sex. Taking low-dose aspirin every day. Taking vitamin and mineral supplements as recommended by your health care provider. What happens during an annual well check? The services and screenings done  by your health care provider during your annual well check will depend on your age, overall health, lifestyle risk factors, and family history of disease. Counseling  Your health care provider may ask you questions about your: Alcohol use. Tobacco use. Drug use. Emotional well-being. Home and relationship well-being. Sexual activity. Eating habits. History of falls. Memory and ability to understand (cognition). Work and work Statistician. Reproductive health. Screening  You may have the following tests or measurements: Height, weight, and BMI. Blood pressure. Lipid and cholesterol levels. These may be checked every 5 years, or more frequently if you are over 15 years old. Skin check. Lung cancer screening. You may have this screening every year starting at age 67 if you have a 30-pack-year history of smoking and currently smoke or have quit within the past 15 years. Fecal occult blood test (FOBT) of the stool. You may have this test every year starting at age 61. Flexible sigmoidoscopy or colonoscopy. You may have a sigmoidoscopy every 5 years or a colonoscopy every 10 years starting at age 79. Hepatitis C blood test. Hepatitis B blood test. Sexually transmitted disease (STD) testing. Diabetes screening. This is done by checking your blood sugar (glucose) after you have not eaten for a while (fasting). You may have this done every 1-3 years. Bone density scan. This is done to screen for osteoporosis. You may have this done starting at age 47. Mammogram. This may be done every 1-2 years. Talk to your health care provider about how often you should have regular mammograms. Talk with your health care provider about your test results, treatment options, and if necessary, the need for more tests. Vaccines  Your health care provider may recommend certain vaccines, such as: Influenza vaccine. This is recommended every year. Tetanus, diphtheria,  and acellular pertussis (Tdap, Td) vaccine. You  may need a Td booster every 10 years. Zoster vaccine. You may need this after age 12. Pneumococcal 13-valent conjugate (PCV13) vaccine. One dose is recommended after age 11. Pneumococcal polysaccharide (PPSV23) vaccine. One dose is recommended after age 13. Talk to your health care provider about which screenings and vaccines you need and how often you need them. This information is not intended to replace advice given to you by your health care provider. Make sure you discuss any questions you have with your health care provider. Document Released: 05/25/2015 Document Revised: 01/16/2016 Document Reviewed: 02/27/2015 Elsevier Interactive Patient Education  2017 [place] Prevention in the Home Falls can cause injuries. They can happen to people of all ages. There are many things you can do to make your home safe and to help prevent falls. What can I do on the outside of my home? Regularly fix the edges of walkways and driveways and fix any cracks. Remove anything that might make you trip as you walk through a door, such as a raised step or threshold. Trim any bushes or trees on the path to your home. Use bright outdoor lighting. Clear any walking paths of anything that might make someone trip, such as rocks or tools. Regularly check to see if handrails are loose or broken. Make sure that both sides of any steps have handrails. Any raised decks and porches should have guardrails on the edges. Have any leaves, snow, or ice cleared regularly. Use sand or salt on walking paths during winter. Clean up any spills in your garage right away. This includes oil or grease spills. What can I do in the bathroom? Use night lights. Install grab bars by the toilet and in the tub and shower. Do not use towel bars as grab bars. Use non-skid mats or decals in the tub or shower. If you need to sit down in the shower, use a plastic, non-slip stool. Keep the floor dry. Clean up any water that spills  on the floor as soon as it happens. Remove soap buildup in the tub or shower regularly. Attach bath mats securely with double-sided non-slip rug tape. Do not have throw rugs and other things on the floor that can make you trip. What can I do in the bedroom? Use night lights. Make sure that you have a light by your bed that is easy to reach. Do not use any sheets or blankets that are too big for your bed. They should not hang down onto the floor. Have a firm chair that has side arms. You can use this for support while you get dressed. Do not have throw rugs and other things on the floor that can make you trip. What can I do in the kitchen? Clean up any spills right away. Avoid walking on wet floors. Keep items that you use a lot in easy-to-reach places. If you need to reach something above you, use a strong step stool that has a grab bar. Keep electrical cords out of the way. Do not use floor polish or wax that makes floors slippery. If you must use wax, use non-skid floor wax. Do not have throw rugs and other things on the floor that can make you trip. What can I do with my stairs? Do not leave any items on the stairs. Make sure that there are handrails on both sides of the stairs and use them. Fix handrails that are broken or loose. Make sure  that handrails are as long as the stairways. Check any carpeting to make sure that it is firmly attached to the stairs. Fix any carpet that is loose or worn. Avoid having throw rugs at the top or bottom of the stairs. If you do have throw rugs, attach them to the floor with carpet tape. Make sure that you have a light switch at the top of the stairs and the bottom of the stairs. If you do not have them, ask someone to add them for you. What else can I do to help prevent falls? Wear shoes that: Do not have high heels. Have rubber bottoms. Are comfortable and fit you well. Are closed at the toe. Do not wear sandals. If you use a stepladder: Make  sure that it is fully opened. Do not climb a closed stepladder. Make sure that both sides of the stepladder are locked into place. Ask someone to hold it for you, if possible. Clearly mark and make sure that you can see: Any grab bars or handrails. First and last steps. Where the edge of each step is. Use tools that help you move around (mobility aids) if they are needed. These include: Canes. Walkers. Scooters. Crutches. Turn on the lights when you go into a dark area. Replace any light bulbs as soon as they burn out. Set up your furniture so you have a clear path. Avoid moving your furniture around. If any of your floors are uneven, fix them. If there are any pets around you, be aware of where they are. Review your medicines with your doctor. Some medicines can make you feel dizzy. This can increase your chance of falling. Ask your doctor what other things that you can do to help prevent falls. This information is not intended to replace advice given to you by your health care provider. Make sure you discuss any questions you have with your health care provider. Document Released: 02/22/2009 Document Revised: 10/04/2015 Document Reviewed: 06/02/2014 Elsevier Interactive Patient Education  2017 [employer].

## 2020-12-10 NOTE — Progress Notes (Signed)
PCP notes:  Health Maintenance: Shingrix- due    Abnormal Screenings: none   Patient concerns: none   Nurse concerns: none   Next PCP appt.: 12/25/2020 @ 2 pm

## 2020-12-10 NOTE — Progress Notes (Signed)
Subjective:   Mahlia Bailer is a 80 y.o. female who presents for Medicare Annual (Subsequent) preventive examination.  Review of Systems: N/A      I connected with the patient today by telephone and verified that I am speaking with the correct person using two identifiers. Location patient: home Location nurse: work Persons participating in the telephone visit: patient, nurse.   I discussed the limitations, risks, security and privacy concerns of performing an evaluation and management service by telephone and the availability of in person appointments. I also discussed with the patient that there may be a patient responsible charge related to this service. The patient expressed understanding and verbally consented to this telephonic visit.        Cardiac Risk Factors include: advanced age (>15mn, >>34women);hypertension;Other (see comment), Risk factor comments: hyperlipidemia     Objective:    Today's Vitals   There is no height or weight on file to calculate BMI.  Advanced Directives 12/10/2020 11/30/2019 11/06/2017 11/06/2016 11/03/2016 10/20/2016 09/29/2016  Does Patient Have a Medical Advance Directive? Yes Yes Yes Yes Yes Yes Yes  Type of AParamedicof [place] will [place]Living will [place] will [place]Living will [place]Living will [place]Living will [place]Living will  Does patient want to make changes to medical advance directive? - - - - - - -  Copy of [place]in Chart? No - copy requested No - copy requested Yes - Yes - -  Pre-existing out of facility DNR order (yellow form or pink MOST form) - - - - - - -    Current Medications (verified) Outpatient Encounter Medications as of 12/10/2020  Medication Sig   Aspirin (ASPIR-81 PO) Take 1 tablet by mouth.   b complex vitamins tablet  Take 1 tablet by mouth daily.   Calcium Citrate-Vitamin D (CALCIUM CITRATE + PO) Take 1 tablet by mouth daily.   Cholecalciferol (VITAMIN D3) 2000 UNITS TABS Take 1 capsule by mouth 2 (two) times daily.   clindamycin (CLEOCIN) 150 MG capsule Take 150 mg by mouth as needed. Takes 4 tablets before a dental procedure   Coenzyme Q10 (COQ10) 100 MG CAPS Take 1 capsule by mouth daily.   Misc Natural Products (GLUCOSAMINE CHOND MSM FORMULA PO) Take 1,500 mg by mouth.   Multiple Vitamin (MULTI VITAMIN DAILY PO) Take 1 tablet by mouth.   Omega-3 Fatty Acids (FISH OIL) 1200 MG CAPS Take 1 capsule by mouth.   rosuvastatin (CRESTOR) 10 MG tablet Take 10 mg by mouth every Monday, Wednesday, and Friday.   triamterene-hydrochlorothiazide (MAXZIDE-25) 37.5-25 MG tablet TAKE 1/2 TABLET BY MOUTH EVERY DAY   No facility-administered encounter medications on file as of 12/10/2020.    Allergies (verified) Cefixime, Levaquin [levofloxacin], and Penicillins   History: Past Medical History:  Diagnosis Date   Abnormal Pap smear    Arthritis    breast cancer 2009   right   Cancer ([facility]    CHF (congestive heart failure) (HCC)    DDD (degenerative disc disease), lumbar    High cholesterol    Hyperlipidemia    Hypertension    Meniscus tear    right   Personal history of radiation therapy    RBBB    Past Surgical History:  Procedure Laterality Date   BREAST BIOPSY Right 08/18/2007   BREAST LUMPECTOMY Right 2009   BREAST SURGERY Right 2009  lumpectomy, no radiation, no chemo   DILATION AND CURETTAGE OF UTERUS  2012   Dr. Fermin Schwab   HERNIA REPAIR     umbilical   JOINT REPLACEMENT Left    hip   LEFT HEART CATH AND CORONARY ANGIOGRAPHY N/A 09/17/2016   Procedure: Left Heart Cath and Coronary Angiography;  Surgeon: Teodoro Spray, MD;  Location: [facility];  Service: Cardiovascular;  Laterality: N/A;   TOTAL HIP ARTHROPLASTY Left    TOTAL KNEE ARTHROPLASTY Right 06/06/2013   Procedure:  RIGHT TOTAL KNEE ARTHROPLASTY;  Surgeon: [name], MD;  Location: [facility];  Service: Orthopedics;  Laterality: Right;   TUBAL LIGATION     Family History  Problem Relation Age of Onset   Cancer Mother        breast and colon   Breast cancer Mother    Hypertension Father    Heart disease Father    Thyroid disease Father    Down syndrome Son    Asthma Other    Social History   Socioeconomic History   Marital status: Married    Spouse name: Not on file   Number of children: 2   Years of education: Not on file   Highest education level: Not on file  Occupational History   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  Tobacco Use   Smoking status: Never   Smokeless tobacco: Never  Vaping Use   Vaping Use: Never used  Substance and Sexual Activity   Alcohol use: Yes    Comment: once a month   Drug use: No   Sexual activity: Yes    Partners: Male    Birth control/protection: Post-menopausal  Other Topics Concern   Not on file  Social History Narrative   Regular exercise--yes, curves 3 times a week   Diet-- fruits and veggies, water, no fast food       Social Determinants of Health   Financial Resource Strain: Low Risk    Difficulty of Paying Living Expenses: Not hard at all  Food Insecurity: No Food Insecurity   Worried About Charity fundraiser in the Last Year: Never true   [place] in the Last Year: Never true  Transportation Needs: No Transportation Needs   Lack of Transportation (Medical): No   Lack of Transportation (Non-Medical): No  Physical Activity: Inactive   Days of Exercise per Week: 0 days   Minutes of Exercise per Session: 0 min  Stress: No Stress Concern Present   Feeling of Stress : Not at all  Social Connections: Not on file    Tobacco Counseling Counseling given: Not Answered   Clinical Intake:  Pre-visit preparation completed: Yes  Pain : No/denies pain     Nutritional Risks: None Diabetes:  No  How often do you need to have someone help you when you read instructions, pamphlets, or other written materials from your doctor or pharmacy?: 1 - Never  Diabetic: No Nutrition Risk Assessment:  Has the patient had any N/V/D within the last 2 months?  No  Does the patient have any non-healing wounds?  No  Has the patient had any unintentional weight loss or weight gain?  No   Diabetes:  Is the patient diabetic?  No  If diabetic, was a CBG obtained today?   N/A Did the patient bring in their glucometer from home?   N/A How often do you monitor your CBG's? N/A.  Financial Strains and Diabetes Management:  Are you having any financial strains with the device, your supplies or your medication?  N/A .  Does the patient want to be seen by Chronic Care Management for management of their diabetes?   N/A Would the patient like to be referred to a Nutritionist or for Diabetic Management?   N/A Interpreter Needed?: No  Information entered by :: CJohnson, RN   Activities of Daily Living In your present state of health, do you have any difficulty performing the following activities: 12/10/2020  Hearing? N  Vision? N  Difficulty concentrating or making decisions? N  Walking or climbing stairs? N  Dressing or bathing? N  Doing errands, shopping? N  Preparing Food and eating ? N  Using the Toilet? N  In the past six months, have you accidently leaked urine? N  Do you have problems with loss of bowel control? N  Managing your Medications? N  Managing your Finances? N  Housekeeping or managing your Housekeeping? N  Some recent data might be hidden    Patient Care Team: Jinny Sanders, MD as PCP - General Nicholas Lose, MD as Consulting Physician (Hematology and Oncology) Gaynelle Arabian, MD as Consulting Physician (Orthopedic Surgery) Emily Filbert, MD (Inactive) as Consulting Physician (Obstetrics and Gynecology) Lupita Raider, DO as Referring Physician (Optometry) Alisa Graff, FNP as Nurse Practitioner (Family Medicine) Teodoro Spray, MD as Consulting Physician (Cardiology)  Indicate any recent Medical Services you may have received from other than [facility] providers in the past year (date may be approximate).     Assessment:   This is a routine wellness examination for [place].  Hearing/Vision screen Vision Screening - Comments:: Patient gets annual eye exams   Dietary issues and exercise activities discussed: Current Exercise Habits: The patient does not participate in regular exercise at present, Exercise limited by: None identified   Goals Addressed             This Visit's Progress    Patient Stated       12/10/2020, I will maintain and continue medications as prescribed.        Depression Screen PHQ 2/9 Scores 12/10/2020 11/30/2019 11/30/2018 11/06/2017 12/25/2016 11/06/2016 11/03/2016  PHQ - 2 Score 0 0 0 0 0 0 0  PHQ- 9 Score 0 0 - 0 2 - -    Fall Risk Fall Risk  12/10/2020 11/30/2019 04/06/2019 11/30/2018 11/06/2017  Falls in the past year? 0 0 1 0 No  Comment - - Emmi Telephone Survey: data to providers prior to load - -  Number falls in past yr: 0 0 1 - -  Comment - - Emmi Telephone Survey Actual Response = 1 - -  Injury with Fall? 0 0 1 - -  Risk for fall due to : Medication side effect Medication side effect - - -  Risk for fall due to: Comment - - - - -  Follow up Falls evaluation completed;Falls prevention discussed Falls evaluation completed;Falls prevention discussed - - -    FALL RISK PREVENTION PERTAINING TO THE HOME:  Any stairs in or around the home? Yes  If so, are there any without handrails? No  Home free of loose throw rugs in walkways, pet beds, electrical cords, etc? Yes  Adequate lighting in your home to reduce risk of falls? Yes   ASSISTIVE DEVICES UTILIZED TO PREVENT FALLS:  Life alert? No  Use of a cane, walker or w/c? No  Grab bars  in the bathroom? No  Shower chair or bench in shower? No  Elevated toilet seat or a  handicapped toilet? No   TIMED UP AND GO:  Was the test performed?  N/A telephone visit .    Cognitive Function: MMSE - Mini Mental State Exam 12/10/2020 11/30/2019 11/06/2017 11/03/2016 10/11/2015  Not completed: Refused - - - -  Orientation to time - '5 5 5 5  '$ Orientation to Place - '5 5 5 5  '$ Registration - '3 3 3 3  '$ Attention/ Calculation - 5 0 0 0  Recall - '3 3 3 3  '$ Language- name 2 objects - - 0 0 0  Language- repeat - '1 1 1 1  '$ Language- follow 3 step command - - '3 3 3  '$ Language- read & follow direction - - 0 0 0  Write a sentence - - 0 0 0  Copy design - - 0 0 0  Total score - - '20 20 20  '$ Mini Cog  Mini-Cog screen was not completed. Maximum score is 22. A value of 0 denotes this part of the MMSE was not completed or the patient failed this part of the Mini-Cog screening.       Immunizations Immunization History  Administered Date(s) Administered   Influenza Split 02/19/2011, 02/23/2012   Influenza Whole 02/10/2007, 02/15/2008, 03/06/2010   Influenza,inj,Quad PF,6+ Mos 02/20/2014, 03/10/2017   Influenza-Unspecified 02/05/2016, 05/12/2018   PFIZER(Purple Top)SARS-COV-2 Vaccination 06/03/2019, 06/24/2019   Pneumococcal Conjugate-13 07/04/2014   Pneumococcal Polysaccharide-23 07/11/2008   Td 09/09/2000   Tdap 11/11/2011   Zoster, Live 11/14/2009    TDAP status: Up to date  Flu Vaccine status: Up to date  Pneumococcal vaccine status: Up to date  Covid-19 vaccine status: Completed 2 vaccines  Qualifies for Shingles Vaccine? Yes   Zostavax completed Yes   Shingrix Completed?: No.    Education has been provided regarding the importance of this vaccine. Patient has been advised to call insurance company to determine out of pocket expense if they have not yet received this vaccine. Advised may also receive vaccine at local pharmacy or Health Dept. Verbalized acceptance and understanding.  Screening Tests Health Maintenance  Topic Date Due   Zoster Vaccines- Shingrix (1 of  2) Never done   COVID-19 Vaccine (3 - Booster for Pfizer series) 11/21/2019   INFLUENZA VACCINE  12/10/2020   TETANUS/TDAP  11/10/2021   MAMMOGRAM  11/13/2021   DEXA SCAN  Completed   Hepatitis C Screening  Completed   PNA vac Low Risk Adult  Completed   HPV VACCINES  Aged Out   COLONOSCOPY (Pts 45-56yr Insurance coverage will need to be confirmed)  Discontinued    Health Maintenance  Health Maintenance Due  Topic Date Due   Zoster Vaccines- Shingrix (1 of 2) Never done   COVID-19 Vaccine (3 - Booster for Pfizer series) 11/21/2019   INFLUENZA VACCINE  12/10/2020    Colorectal cancer screening: Type of screening: Colonoscopy. Completed 12/03/2020. No longer required  Mammogram status: Completed 11/13/2020. Repeat every year  Bone Density status: scheduled 03/21/2021  Lung Cancer Screening: (Low Dose CT Chest recommended if Age 78-80years, 30 pack-year currently smoking OR have quit w/in 15years.) does not qualify.  Additional Screening:  Hepatitis C Screening: does qualify; Completed 12/06/2019  Vision Screening: Recommended annual ophthalmology exams for early detection of glaucoma and other disorders of the eye. Is the patient up to date with their annual eye exam?  Yes  Who is the provider or what is  the name of the office in which the patient attends annual eye exams? [facility] If pt is not established with a provider, would they like to be referred to a provider to establish care? No .   Dental Screening: Recommended annual dental exams for proper oral hygiene  Community Resource Referral / Chronic Care Management: CRR required this visit?  No   CCM required this visit?  No      Plan:     I have personally reviewed and noted the following in the patient's chart:   Medical and social history Use of alcohol, tobacco or illicit drugs  Current medications and supplements including opioid prescriptions.  Functional ability and status Nutritional  status Physical activity Advanced directives List of other physicians Hospitalizations, surgeries, and ER visits in previous 12 months Vitals Screenings to include cognitive, depression, and falls Referrals and appointments  In addition, I have reviewed and discussed with patient certain preventive protocols, quality metrics, and best practice recommendations. A written personalized care plan for preventive services as well as general preventive health recommendations were provided to patient.   Due to this being a telephonic visit, the after visit summary with patients personalized plan was offered to patient via office or my-chart. Patient preferred to pick up at office at next visit or via mychart.   Andrez Grime, LPN   [id number]

## 2020-12-11 ENCOUNTER — Other Ambulatory Visit (INDEPENDENT_AMBULATORY_CARE_PROVIDER_SITE_OTHER): Payer: Medicare Other

## 2020-12-11 ENCOUNTER — Other Ambulatory Visit: Payer: Self-pay

## 2020-12-11 DIAGNOSIS — I10 Essential (primary) hypertension: Secondary | ICD-10-CM | POA: Diagnosis not present

## 2020-12-11 LAB — COMPREHENSIVE METABOLIC PANEL
ALT: 23 U/L (ref 0–35)
AST: 21 U/L (ref 0–37)
Albumin: 3.9 g/dL (ref 3.5–5.2)
Alkaline Phosphatase: 57 U/L (ref 39–117)
BUN: 26 mg/dL — ABNORMAL HIGH (ref 6–23)
CO2: 28 mEq/L (ref 19–32)
Calcium: 9.2 mg/dL (ref 8.4–10.5)
Chloride: 104 mEq/L (ref 96–112)
Creatinine, Ser: 1.25 mg/dL — ABNORMAL HIGH (ref 0.40–1.20)
GFR: 41.38 mL/min — ABNORMAL LOW (ref >60.00)
Glucose, Bld: 99 mg/dL (ref 70–99)
Potassium: 4.2 mEq/L (ref 3.5–5.1)
Sodium: 141 mEq/L (ref 135–145)
Total Bilirubin: 0.4 mg/dL (ref 0.2–1.2)
Total Protein: 7.4 g/dL (ref 6.0–8.3)

## 2020-12-11 LAB — LIPID PANEL
Cholesterol: 141 mg/dL (ref 0–200)
HDL: 41.9 mg/dL (ref >39.00)
LDL Cholesterol: 73 mg/dL (ref 0–99)
NonHDL: 99.56
Total CHOL/HDL Ratio: 3
Triglycerides: 135 mg/dL (ref 0.0–149.0)
VLDL: 27 mg/dL (ref 0.0–40.0)

## 2020-12-13 ENCOUNTER — Encounter: Payer: Medicare Other | Admitting: Family Medicine

## 2020-12-13 NOTE — Progress Notes (Signed)
No critical labs need to be addressed urgently. We will discuss labs in detail at upcoming office visit.   

## 2020-12-19 ENCOUNTER — Other Ambulatory Visit: Payer: Self-pay

## 2020-12-19 ENCOUNTER — Ambulatory Visit
Admission: RE | Admit: 2020-12-19 | Discharge: 2020-12-19 | Disposition: A | Discharge status: Home / Self Care | Payer: Medicare Other | Source: Ambulatory Visit | Attending: Family Medicine | Admitting: Family Medicine

## 2020-12-19 ENCOUNTER — Ambulatory Visit: Payer: Medicare Other

## 2020-12-19 DIAGNOSIS — R922 Inconclusive mammogram: Secondary | ICD-10-CM | POA: Diagnosis not present

## 2020-12-19 DIAGNOSIS — R928 Other abnormal and inconclusive findings on diagnostic imaging of breast: Secondary | ICD-10-CM

## 2020-12-22 ENCOUNTER — Telehealth: Payer: Medicare Other | Admitting: Nurse Practitioner

## 2020-12-22 ENCOUNTER — Encounter: Payer: Self-pay | Admitting: Nurse Practitioner

## 2020-12-22 DIAGNOSIS — U071 COVID-19: Secondary | ICD-10-CM

## 2020-12-22 MED ORDER — MOLNUPIRAVIR EUA 200MG CAPSULE: 4.0000 | ORAL_CAPSULE | Freq: Two times a day (BID) | ORAL | 0 refills | Status: AC

## 2020-12-22 NOTE — Progress Notes (Signed)
Virtual Visit Consent   Darlene Lawrence, you are scheduled for a virtual visit with Darlene Lawrence, [place], a [facility] provider, today.     Just as with appointments in the office, your consent must be obtained to participate.  Your consent will be active for this visit and any virtual visit you may have with one of our providers in the next 365 days.     If you have a MyChart account, a copy of this consent can be sent to you electronically.  All virtual visits are billed to your insurance company just like a traditional visit in the office.    As this is a virtual visit, video technology does not allow for your provider to perform a traditional examination.  This may limit your provider's ability to fully assess your condition.  If your provider identifies any concerns that need to be evaluated in person or the need to arrange testing (such as labs, EKG, etc.), we will make arrangements to do so.     Although advances in technology are sophisticated, we cannot ensure that it will always work on either your end or our end.  If the connection with a video visit is poor, the visit may have to be switched to a telephone visit.  With either a video or telephone visit, we are not always able to ensure that we have a secure connection.     I need to obtain your verbal consent now.   Are you willing to proceed with your visit today? YES   Leela Crackel has provided verbal consent on 12/22/2020 for a virtual visit (video or telephone).   Darlene Hassell Done, FNP   Date: 12/22/2020 2:37 PM   Virtual Visit via Video Note   I, Darlene Lawrence, connected with Darlene Lawrence ([id number], 1940/11/21) on 12/22/20 at  2:45 PM EDT by a video-enabled telemedicine application and verified that I am speaking with the correct person using two identifiers.  Location: Patient: Virtual Visit Location Patient: Home Provider: Virtual Visit Location Provider: Mobile   I discussed  the limitations of evaluation and management by telemedicine and the availability of in person appointments. The patient expressed understanding and agreed to proceed.    History of Present Illness: Darlene Lawrence is a 80 y.o. who identifies as a female who was assigned female at birth, and is being seen today for covid positive.  HPI: Patient states that she developed sweats and not feeling well last night. This morning she developed a sore thorat. She has to immediate family memebers with covid.   Review of Systems  Constitutional:  Positive for malaise/fatigue. Negative for chills and fever.  HENT:  Positive for congestion and sore throat. Negative for ear pain.   Respiratory:  Positive for cough (slight). Negative for sputum production and shortness of breath.   Musculoskeletal:  Negative for myalgias.  Neurological:  Negative for headaches.   Problems:  Patient Active Problem List   Diagnosis Date Noted   Ear fullness, left 12/06/2019   Heart failure with preserved ejection fraction ([place]) 09/30/2016   CKD (chronic kidney disease) stage 3, GFR 30-59 ml/min (HCC) 10/16/2015   Bilateral hearing loss 10/16/2015   Arthritis 11/10/2014   History of breast cancer 11/10/2014   Counseling regarding end of life decision making 07/04/2014   Postmenopausal 01/13/2014   OA (osteoarthritis) of knee 06/06/2013   Bladder prolapse, female, acquired 06/09/2012   Uterine prolapse 09/11/2010   Hyperlipidemia 06/28/2007   Essential hypertension  06/28/2007   BUNDLE BRANCH BLOCK, RIGHT 06/28/2007    Allergies:  Allergies  Allergen Reactions   Cefixime     REACTION: Rash   Levaquin [Levofloxacin] Other (See Comments)    Shoulder Bursitis   Penicillins     REACTION: Rash   Medications:  Current Outpatient Medications:    Aspirin (ASPIR-81 PO), Take 1 tablet by mouth., Disp: , Rfl:    b complex vitamins tablet, Take 1 tablet by mouth daily., Disp: , Rfl:    Calcium Citrate-Vitamin D  (CALCIUM CITRATE + PO), Take 1 tablet by mouth daily., Disp: , Rfl:    Cholecalciferol (VITAMIN D3) 2000 UNITS TABS, Take 1 capsule by mouth 2 (two) times daily., Disp: , Rfl:    clindamycin (CLEOCIN) 150 MG capsule, Take 150 mg by mouth as needed. Takes 4 tablets before a dental procedure, Disp: , Rfl:    Coenzyme Q10 (COQ10) 100 MG CAPS, Take 1 capsule by mouth daily., Disp: , Rfl:    Misc Natural Products (GLUCOSAMINE CHOND MSM FORMULA PO), Take 1,500 mg by mouth., Disp: , Rfl:    Multiple Vitamin (MULTI VITAMIN DAILY PO), Take 1 tablet by mouth., Disp: , Rfl:    Omega-3 Fatty Acids (FISH OIL) 1200 MG CAPS, Take 1 capsule by mouth., Disp: , Rfl:    rosuvastatin (CRESTOR) 10 MG tablet, Take 10 mg by mouth every Monday, Wednesday, and Friday., Disp: , Rfl:    triamterene-hydrochlorothiazide (MAXZIDE-25) 37.5-25 MG tablet, TAKE 1/2 TABLET BY MOUTH EVERY DAY, Disp: 45 tablet, Rfl: 0  Observations/Objective: Patient is well-developed, well-nourished in no acute distress.  Resting comfortably  at home.  Head is normocephalic, atraumatic.  No labored breathing.  Speech is clear and coherent with logical content.  Patient is alert and oriented at baseline.  Voice hoarse No cough moted  Assessment and Plan:  Sebastian Ache in today with chief complaint of Covid Positive   1. Lab test positive for detection of COVID-19 virus 1. Take meds as prescribed 2. Use a cool mist humidifier especially during the winter months and when heat has been humid. 3. Use saline nose sprays frequently 4. Saline irrigations of the nose can be very helpful if Lawrence frequently.  * 4X daily for 1 week*  * Use of a nettie pot can be helpful with this. Follow directions with this* 5. Drink plenty of fluids 6. Keep thermostat turn down low 7.For any cough or congestion  Use plain Mucinex- regular strength or max strength is fine   * Children- consult with Pharmacist for dosing 8. For fever or aces or pains-  take tylenol or ibuprofen appropriate for age and weight.  * for fevers greater than 101 orally you may alternate ibuprofen and tylenol every  3 hours.   Meds ordered this encounter  Medications   molnupiravir EUA 200 mg CAPS    Sig: Take 4 capsules (800 mg total) by mouth 2 (two) times daily for 5 days.    Dispense:  40 capsule    Refill:  0    Order Specific Question:   Supervising Provider    Answer:   Noemi Chapel [[id number]]        Follow Up Instructions: I discussed the assessment and treatment plan with the patient. The patient was provided an opportunity to ask questions and all were answered. The patient agreed with the plan and demonstrated an understanding of the instructions.  A copy of instructions were sent to the patient via MyChart.  The  patient was advised to call back or seek an in-person evaluation if the symptoms worsen or if the condition fails to improve as anticipated.  Time:  I spent 10 minutes with the patient via telehealth technology discussing the above problems/concerns.    Darlene Hassell Done, FNP

## 2020-12-22 NOTE — Progress Notes (Signed)
Not sure I understand your answers to questionnaire. Did you actually test positive for covid or have you been exposed to covid. Also you cannot get antivirals in an evisit. If that is what you are seeking for treatment you have  to do a video visit.

## 2020-12-22 NOTE — Patient Instructions (Signed)
You are being prescribed MOLNUPIRAVIR for COVID-19 infection.     Please pick up your prescription at: [facility] called into [address]   Please call the pharmacy or go through the drive through vs going inside if you are picking up the mediation yourself to prevent further spread. If prescribed to a [facility] affiliated pharmacy, a pharmacist will bring the medication out to your car.   ADMINISTRATION INSTRUCTIONS: Take with or without food. Swallow the tablets whole. Don't chew, crush, or break the medications because it might not work as well  For each dose of the medication, you should be taking FOUR tablets at one time, TWICE a day   Finish your full five-day course of Molnupiravir even if you feel better before you're done. Stopping this medication too early can make it less effective to prevent severe illness related to [place].    Molnupiravir is prescribed for YOU ONLY. Don't share it with others, even if they have similar symptoms as you. This medication might not be right for everyone.   Make sure to take steps to protect yourself and others while you're taking this medication in order to get well soon and to prevent others from getting sick with COVID-19.   **If you are of childbearing potential (any gender) - it is advised to not get pregnant while taking this medication and recommended that condoms are used for female partners the next 3 months after taking the medication out of extreme caution    COMMON SIDE EFFECTS: Diarrhea Nausea  Dizziness    If your COVID-19 symptoms get worse, get medical help right away. Call 911 if you experience symptoms such as worsening cough, trouble breathing, chest pain that doesn't go away, confusion, a hard time staying awake, and pale or blue-colored skin. This medication won't prevent all COVID-19 cases from getting worse.

## 2020-12-25 ENCOUNTER — Ambulatory Visit: Payer: Medicare Other | Admitting: Family Medicine

## 2020-12-31 DIAGNOSIS — M5136 Other intervertebral disc degeneration, lumbar region: Secondary | ICD-10-CM | POA: Diagnosis not present

## 2020-12-31 DIAGNOSIS — M9903 Segmental and somatic dysfunction of lumbar region: Secondary | ICD-10-CM | POA: Diagnosis not present

## 2020-12-31 DIAGNOSIS — M5033 Other cervical disc degeneration, cervicothoracic region: Secondary | ICD-10-CM | POA: Diagnosis not present

## 2020-12-31 DIAGNOSIS — M9901 Segmental and somatic dysfunction of cervical region: Secondary | ICD-10-CM | POA: Diagnosis not present

## 2021-01-07 ENCOUNTER — Encounter: Payer: Self-pay | Admitting: Family Medicine

## 2021-01-08 ENCOUNTER — Other Ambulatory Visit: Payer: Self-pay

## 2021-01-08 ENCOUNTER — Encounter: Payer: Self-pay | Admitting: Family Medicine

## 2021-01-08 ENCOUNTER — Ambulatory Visit (INDEPENDENT_AMBULATORY_CARE_PROVIDER_SITE_OTHER): Payer: Medicare Other | Admitting: Family Medicine

## 2021-01-08 VITALS — BP 140/78 | HR 55 | Temp 98.2°F | Ht 64.5 in | Wt 219.8 lb

## 2021-01-08 DIAGNOSIS — E782 Mixed hyperlipidemia: Secondary | ICD-10-CM

## 2021-01-08 DIAGNOSIS — N1831 Chronic kidney disease, stage 3a: Secondary | ICD-10-CM | POA: Diagnosis not present

## 2021-01-08 DIAGNOSIS — I10 Essential (primary) hypertension: Secondary | ICD-10-CM

## 2021-01-08 DIAGNOSIS — I5033 Acute on chronic diastolic (congestive) heart failure: Secondary | ICD-10-CM

## 2021-01-08 MED ORDER — BETAMETHASONE DIPROPIONATE 0.05 % EX CREA
TOPICAL_CREAM | Freq: Two times a day (BID) | CUTANEOUS | 0 refills | Status: DC
Start: 2021-01-08 — End: 2022-04-11

## 2021-01-08 NOTE — Progress Notes (Signed)
Patient ID: Jernell Mcglynn, female    DOB: 1941/02/27, 80 y.o.   MRN: [id number]  This visit was conducted in person.  BP 140/78   Pulse (!) 55   Temp 98.2 F (36.8 C) (Temporal)   Ht 5' 4.5" (1.638 m)   Wt 219 lb 12 oz (99.7 kg)   LMP  (LMP Unknown)   SpO2 95%   BMI 37.14 kg/m    CC: Chief Complaint  Patient presents with   Annual Exam    Part 2    Subjective:   HPI: Sheresa Tessmer is a 80 y.o. female presenting on 01/08/2021 for Annual Exam (Part 2)  The patient presents for  review of chronic health problems. He/She also has the following acute concerns today: none  The patient saw a LPN or RN for medicare wellness visit.  Prevention and wellness was reviewed in detail. Note reviewed and important notes copied below. Health Maintenance: Shingrix- due   Abnormal Screenings: None  Hypertension:    Good control BP Readings from Last 3 Encounters:  01/08/21 140/78  08/29/20 125/79  12/06/19 (!) 130/80  Using medication without problems or lightheadedness:  none Chest pain with exertion: none Edema:none Short of breath: Average home BPs: 126/70s Other issues:   Elevated Cholesterol:  At goal on crestor 10 mg three days a week. Lab Results  Component Value Date   CHOL 141 12/11/2020   HDL 41.90 12/11/2020   LDLCALC 73 12/11/2020   TRIG 135.0 12/11/2020   CHOLHDL 3 12/11/2020  Using medications without problems: Muscle aches:  Diet compliance: moderate Exercise:  minimal Other complaints:  CKD stable GFR. Avoiding NSAIDs    HFpEF: on maxide.. euvolemic in office today  Wt Readings from Last 3 Encounters:  01/08/21 219 lb 12 oz (99.7 kg)  08/29/20 222 lb (100.7 kg)  12/06/19 (!) 218 lb (98.9 kg)     Relevant past medical, surgical, family and social history reviewed and updated as indicated. Interim medical history since our last visit reviewed. Allergies and medications reviewed and updated. Outpatient Medications Prior to Visit   Medication Sig Dispense Refill   Aspirin (ASPIR-81 PO) Take 1 tablet by mouth.     b complex vitamins tablet Take 1 tablet by mouth daily.     Calcium Citrate-Vitamin D (CALCIUM CITRATE + PO) Take 1 tablet by mouth daily.     Cholecalciferol (VITAMIN D3) 2000 UNITS TABS Take 1 capsule by mouth 2 (two) times daily.     clindamycin (CLEOCIN) 150 MG capsule Take 150 mg by mouth as needed. Takes 4 tablets before a dental procedure     Coenzyme Q10 (COQ10) 100 MG CAPS Take 1 capsule by mouth daily.     Misc Natural Products (GLUCOSAMINE CHOND MSM FORMULA PO) Take 1,500 mg by mouth.     Multiple Vitamin (MULTI VITAMIN DAILY PO) Take 1 tablet by mouth.     Omega-3 Fatty Acids (FISH OIL) 1200 MG CAPS Take 1 capsule by mouth.     rosuvastatin (CRESTOR) 10 MG tablet Take 10 mg by mouth every Monday, Wednesday, and Friday.     triamterene-hydrochlorothiazide (MAXZIDE-25) 37.5-25 MG tablet TAKE 1/2 TABLET BY MOUTH EVERY DAY 45 tablet 0   No facility-administered medications prior to visit.     Per HPI unless specifically indicated in ROS section below Review of Systems  Constitutional:  Negative for fatigue, fever and unexpected weight change.  HENT:  Negative for congestion, ear pain, sinus pressure, sneezing, sore throat  and trouble swallowing.   Eyes:  Negative for pain and itching.  Respiratory:  Negative for cough, chest tightness, shortness of breath and wheezing.   Cardiovascular:  Negative for chest pain, palpitations and leg swelling.  Gastrointestinal:  Negative for abdominal pain, blood in stool, constipation, diarrhea and nausea.  Genitourinary:  Negative for difficulty urinating, dysuria, hematuria, menstrual problem and vaginal discharge.  Skin:  Negative for rash.  Neurological:  Negative for syncope, weakness, light-headedness, numbness and headaches.  Psychiatric/Behavioral:  Negative for confusion and dysphoric mood. The patient is not nervous/anxious.   Objective:  BP 140/78    Pulse (!) 55   Temp 98.2 F (36.8 C) (Temporal)   Ht 5' 4.5" (1.638 m)   Wt 219 lb 12 oz (99.7 kg)   LMP  (LMP Unknown)   SpO2 95%   BMI 37.14 kg/m   Wt Readings from Last 3 Encounters:  01/08/21 219 lb 12 oz (99.7 kg)  08/29/20 222 lb (100.7 kg)  12/06/19 (!) 218 lb (98.9 kg)      Physical Exam Vitals and nursing note reviewed.  Constitutional:      General: She is not in acute distress.    Appearance: Normal appearance. She is well-developed. She is obese. She is not ill-appearing or toxic-appearing.  HENT:     Head: Normocephalic.     Right Ear: Hearing, tympanic membrane, ear canal and external ear normal.     Left Ear: Hearing, tympanic membrane, ear canal and external ear normal.     Nose: Nose normal.  Eyes:     General: Lids are normal. Lids are everted, no foreign bodies appreciated.     Conjunctiva/sclera: Conjunctivae normal.     Pupils: Pupils are equal, round, and reactive to light.  Neck:     Thyroid: No thyroid mass or thyromegaly.     Vascular: No carotid bruit.     Trachea: Trachea normal.  Cardiovascular:     Rate and Rhythm: Normal rate and regular rhythm.     Heart sounds: Normal heart sounds, S1 normal and S2 normal. No murmur heard.   No gallop.  Pulmonary:     Effort: Pulmonary effort is normal. No respiratory distress.     Breath sounds: Normal breath sounds. No wheezing, rhonchi or rales.  Abdominal:     General: Bowel sounds are normal. There is no distension or abdominal bruit.     Palpations: Abdomen is soft. There is no fluid wave or mass.     Tenderness: There is no abdominal tenderness. There is no guarding or rebound.     Hernia: No hernia is present.  Musculoskeletal:     Cervical back: Normal range of motion and neck supple.  Lymphadenopathy:     Cervical: No cervical adenopathy.  Skin:    General: Skin is warm and dry.     Findings: No rash.  Neurological:     Mental Status: She is alert.     Cranial Nerves: No cranial nerve  deficit.     Sensory: No sensory deficit.  Psychiatric:        Mood and Affect: Mood is not anxious or depressed.        Speech: Speech normal.        Behavior: Behavior normal. Behavior is cooperative.        Judgment: Judgment normal.      Results for orders placed or performed in visit on 01/07/21  HM COLONOSCOPY  Result Value Ref Range   HM  Colonoscopy See Report (in chart) See Report (in chart), Patient Reported    This visit occurred during the SARS-CoV-2 public health emergency.  Safety protocols were in place, including screening questions prior to the visit, additional usage of staff PPE, and extensive cleaning of exam room while observing appropriate contact time as indicated for disinfecting solutions.   COVID 19 screen:  No recent travel or known exposure to COVID19 The patient denies respiratory symptoms of COVID 19 at this time. The importance of social distancing was discussed today.   Assessment and Plan   The patient's preventative maintenance and recommended screening tests for an annual wellness exam were reviewed in full today. Brought up to date unless services declined.  Counselled on the importance of diet, exercise, and its role in overall health and mortality. The patient's FH and SH was reviewed, including their home life, tobacco status, and drug and alcohol status.   Vaccines: Uptodate . S/P COVID19 vaccine x 2, consdier shingrix.Marland Kitchen get flu in 02/2021 PAP/DVE: Per Dr. Hulan Fray.  Following uterine prolapse. Decided to not move ahead with hysterectomy given SE risks. Breast exam/mammogram:  11/2020 needes further imaging for assymetry..  initital but then laster felt to be skin related.hx of breast cancer 2009 Colonoscopy: Dr. Cristina Gong  12/04/20 normal, no further indicated Nonsmoker   DEXA:  osteopenia in 2016,  scheduled in Novemeber Discussed Hep C testing.   Problem List Items Addressed This Visit     CKD (chronic kidney disease) stage 3, GFR 30-59 ml/min  (HCC) - Primary    Stable avoiding NSAIDs      Essential hypertension    Stable, chronic.  Continue current medication.         Heart failure with preserved ejection fraction ([place])    Euvolemic in office today.      Hyperlipidemia    Stable, chronic.  Continue current medication.   At goal on crestor 10 mg three days a week.       Meds ordered this encounter  Medications   betamethasone dipropionate 0.05 % cream    Sig: Apply topically 2 (two) times daily.    Dispense:  30 g    Refill:  0        Eliezer Lofts, MD

## 2021-01-08 NOTE — Patient Instructions (Addendum)
Get back on track with regular exercise.. walking.  Apply topical  steroid  once or twice daily for two to four weeks or until improvement .. call if not improving over time.

## 2021-01-09 ENCOUNTER — Other Ambulatory Visit: Payer: Medicare Other

## 2021-01-28 DIAGNOSIS — M9903 Segmental and somatic dysfunction of lumbar region: Secondary | ICD-10-CM | POA: Diagnosis not present

## 2021-01-28 DIAGNOSIS — M9901 Segmental and somatic dysfunction of cervical region: Secondary | ICD-10-CM | POA: Diagnosis not present

## 2021-01-28 DIAGNOSIS — M5033 Other cervical disc degeneration, cervicothoracic region: Secondary | ICD-10-CM | POA: Diagnosis not present

## 2021-01-28 DIAGNOSIS — M5136 Other intervertebral disc degeneration, lumbar region: Secondary | ICD-10-CM | POA: Diagnosis not present

## 2021-01-31 DIAGNOSIS — H269 Unspecified cataract: Secondary | ICD-10-CM | POA: Diagnosis not present

## 2021-02-07 NOTE — Assessment & Plan Note (Signed)
Stable, chronic.  Continue current medication.    

## 2021-02-07 NOTE — Assessment & Plan Note (Signed)
Stable, chronic.  Continue current medication.   At goal on crestor 10 mg three days a week.

## 2021-02-07 NOTE — Assessment & Plan Note (Signed)
Stable avoiding NSAIDs

## 2021-02-07 NOTE — Assessment & Plan Note (Signed)
Euvolemic in office today 

## 2021-02-21 ENCOUNTER — Other Ambulatory Visit: Payer: Self-pay | Admitting: Family Medicine

## 2021-02-25 DIAGNOSIS — Z23 Encounter for immunization: Secondary | ICD-10-CM | POA: Diagnosis not present

## 2021-02-25 DIAGNOSIS — M5136 Other intervertebral disc degeneration, lumbar region: Secondary | ICD-10-CM | POA: Diagnosis not present

## 2021-02-25 DIAGNOSIS — M9903 Segmental and somatic dysfunction of lumbar region: Secondary | ICD-10-CM | POA: Diagnosis not present

## 2021-02-25 DIAGNOSIS — M9901 Segmental and somatic dysfunction of cervical region: Secondary | ICD-10-CM | POA: Diagnosis not present

## 2021-02-25 DIAGNOSIS — M5033 Other cervical disc degeneration, cervicothoracic region: Secondary | ICD-10-CM | POA: Diagnosis not present

## 2021-03-21 ENCOUNTER — Ambulatory Visit
Admission: RE | Admit: 2021-03-21 | Discharge: 2021-03-21 | Disposition: A | Discharge status: Home / Self Care | Payer: Medicare Other | Source: Ambulatory Visit | Attending: Family Medicine | Admitting: Family Medicine

## 2021-03-21 ENCOUNTER — Other Ambulatory Visit: Payer: Self-pay

## 2021-03-21 DIAGNOSIS — E2839 Other primary ovarian failure: Secondary | ICD-10-CM

## 2021-03-21 DIAGNOSIS — M85851 Other specified disorders of bone density and structure, right thigh: Secondary | ICD-10-CM | POA: Diagnosis not present

## 2021-03-25 DIAGNOSIS — M5136 Other intervertebral disc degeneration, lumbar region: Secondary | ICD-10-CM | POA: Diagnosis not present

## 2021-03-25 DIAGNOSIS — M9901 Segmental and somatic dysfunction of cervical region: Secondary | ICD-10-CM | POA: Diagnosis not present

## 2021-03-25 DIAGNOSIS — M9903 Segmental and somatic dysfunction of lumbar region: Secondary | ICD-10-CM | POA: Diagnosis not present

## 2021-03-25 DIAGNOSIS — M5033 Other cervical disc degeneration, cervicothoracic region: Secondary | ICD-10-CM | POA: Diagnosis not present

## 2021-03-26 ENCOUNTER — Encounter: Payer: Self-pay | Admitting: Family Medicine

## 2021-03-26 DIAGNOSIS — M8580 Other specified disorders of bone density and structure, unspecified site: Secondary | ICD-10-CM | POA: Insufficient documentation

## 2021-04-22 DIAGNOSIS — M5033 Other cervical disc degeneration, cervicothoracic region: Secondary | ICD-10-CM | POA: Diagnosis not present

## 2021-04-22 DIAGNOSIS — M9903 Segmental and somatic dysfunction of lumbar region: Secondary | ICD-10-CM | POA: Diagnosis not present

## 2021-04-22 DIAGNOSIS — M9901 Segmental and somatic dysfunction of cervical region: Secondary | ICD-10-CM | POA: Diagnosis not present

## 2021-04-22 DIAGNOSIS — M5136 Other intervertebral disc degeneration, lumbar region: Secondary | ICD-10-CM | POA: Diagnosis not present

## 2021-05-20 DIAGNOSIS — M9903 Segmental and somatic dysfunction of lumbar region: Secondary | ICD-10-CM | POA: Diagnosis not present

## 2021-05-20 DIAGNOSIS — M62830 Muscle spasm of back: Secondary | ICD-10-CM | POA: Diagnosis not present

## 2021-05-20 DIAGNOSIS — M5136 Other intervertebral disc degeneration, lumbar region: Secondary | ICD-10-CM | POA: Diagnosis not present

## 2021-05-20 DIAGNOSIS — M9902 Segmental and somatic dysfunction of thoracic region: Secondary | ICD-10-CM | POA: Diagnosis not present

## 2021-06-17 DIAGNOSIS — M5136 Other intervertebral disc degeneration, lumbar region: Secondary | ICD-10-CM | POA: Diagnosis not present

## 2021-06-17 DIAGNOSIS — M9902 Segmental and somatic dysfunction of thoracic region: Secondary | ICD-10-CM | POA: Diagnosis not present

## 2021-06-17 DIAGNOSIS — M9903 Segmental and somatic dysfunction of lumbar region: Secondary | ICD-10-CM | POA: Diagnosis not present

## 2021-06-17 DIAGNOSIS — M62830 Muscle spasm of back: Secondary | ICD-10-CM | POA: Diagnosis not present

## 2021-07-06 ENCOUNTER — Encounter: Payer: Self-pay | Admitting: Radiology

## 2021-07-15 DIAGNOSIS — M9902 Segmental and somatic dysfunction of thoracic region: Secondary | ICD-10-CM | POA: Diagnosis not present

## 2021-07-15 DIAGNOSIS — M5136 Other intervertebral disc degeneration, lumbar region: Secondary | ICD-10-CM | POA: Diagnosis not present

## 2021-07-15 DIAGNOSIS — M9903 Segmental and somatic dysfunction of lumbar region: Secondary | ICD-10-CM | POA: Diagnosis not present

## 2021-07-15 DIAGNOSIS — M62830 Muscle spasm of back: Secondary | ICD-10-CM | POA: Diagnosis not present

## 2021-08-12 DIAGNOSIS — M9902 Segmental and somatic dysfunction of thoracic region: Secondary | ICD-10-CM | POA: Diagnosis not present

## 2021-08-12 DIAGNOSIS — M62830 Muscle spasm of back: Secondary | ICD-10-CM | POA: Diagnosis not present

## 2021-08-12 DIAGNOSIS — M5136 Other intervertebral disc degeneration, lumbar region: Secondary | ICD-10-CM | POA: Diagnosis not present

## 2021-08-12 DIAGNOSIS — M9903 Segmental and somatic dysfunction of lumbar region: Secondary | ICD-10-CM | POA: Diagnosis not present

## 2021-08-30 DIAGNOSIS — L820 Inflamed seborrheic keratosis: Secondary | ICD-10-CM | POA: Diagnosis not present

## 2021-08-30 DIAGNOSIS — L03011 Cellulitis of right finger: Secondary | ICD-10-CM | POA: Diagnosis not present

## 2021-09-09 DIAGNOSIS — M62830 Muscle spasm of back: Secondary | ICD-10-CM | POA: Diagnosis not present

## 2021-09-09 DIAGNOSIS — M9902 Segmental and somatic dysfunction of thoracic region: Secondary | ICD-10-CM | POA: Diagnosis not present

## 2021-09-09 DIAGNOSIS — M5136 Other intervertebral disc degeneration, lumbar region: Secondary | ICD-10-CM | POA: Diagnosis not present

## 2021-09-09 DIAGNOSIS — M9903 Segmental and somatic dysfunction of lumbar region: Secondary | ICD-10-CM | POA: Diagnosis not present

## 2021-09-12 DIAGNOSIS — Z20822 Contact with and (suspected) exposure to covid-19: Secondary | ICD-10-CM | POA: Diagnosis not present

## 2021-10-09 ENCOUNTER — Other Ambulatory Visit: Payer: Self-pay | Admitting: Family Medicine

## 2021-10-09 DIAGNOSIS — Z1231 Encounter for screening mammogram for malignant neoplasm of breast: Secondary | ICD-10-CM

## 2021-10-14 DIAGNOSIS — M9903 Segmental and somatic dysfunction of lumbar region: Secondary | ICD-10-CM | POA: Diagnosis not present

## 2021-10-14 DIAGNOSIS — M5136 Other intervertebral disc degeneration, lumbar region: Secondary | ICD-10-CM | POA: Diagnosis not present

## 2021-10-14 DIAGNOSIS — M62830 Muscle spasm of back: Secondary | ICD-10-CM | POA: Diagnosis not present

## 2021-10-14 DIAGNOSIS — M9902 Segmental and somatic dysfunction of thoracic region: Secondary | ICD-10-CM | POA: Diagnosis not present

## 2021-11-11 DIAGNOSIS — M5136 Other intervertebral disc degeneration, lumbar region: Secondary | ICD-10-CM | POA: Diagnosis not present

## 2021-11-11 DIAGNOSIS — M9903 Segmental and somatic dysfunction of lumbar region: Secondary | ICD-10-CM | POA: Diagnosis not present

## 2021-11-11 DIAGNOSIS — M9902 Segmental and somatic dysfunction of thoracic region: Secondary | ICD-10-CM | POA: Diagnosis not present

## 2021-11-11 DIAGNOSIS — M62830 Muscle spasm of back: Secondary | ICD-10-CM | POA: Diagnosis not present

## 2021-11-15 ENCOUNTER — Ambulatory Visit
Admission: RE | Admit: 2021-11-15 | Discharge: 2021-11-15 | Disposition: A | Discharge status: Home / Self Care | Payer: Medicare Other | Source: Ambulatory Visit | Attending: Family Medicine | Admitting: Family Medicine

## 2021-11-15 DIAGNOSIS — Z1231 Encounter for screening mammogram for malignant neoplasm of breast: Secondary | ICD-10-CM

## 2021-11-29 DIAGNOSIS — H903 Sensorineural hearing loss, bilateral: Secondary | ICD-10-CM | POA: Diagnosis not present

## 2021-11-29 DIAGNOSIS — R42 Dizziness and giddiness: Secondary | ICD-10-CM | POA: Diagnosis not present

## 2021-11-29 DIAGNOSIS — H6982 Other specified disorders of Eustachian tube, left ear: Secondary | ICD-10-CM | POA: Diagnosis not present

## 2021-12-09 DIAGNOSIS — M9902 Segmental and somatic dysfunction of thoracic region: Secondary | ICD-10-CM | POA: Diagnosis not present

## 2021-12-09 DIAGNOSIS — M62830 Muscle spasm of back: Secondary | ICD-10-CM | POA: Diagnosis not present

## 2021-12-09 DIAGNOSIS — M5136 Other intervertebral disc degeneration, lumbar region: Secondary | ICD-10-CM | POA: Diagnosis not present

## 2021-12-09 DIAGNOSIS — M9903 Segmental and somatic dysfunction of lumbar region: Secondary | ICD-10-CM | POA: Diagnosis not present

## 2021-12-20 ENCOUNTER — Ambulatory Visit (INDEPENDENT_AMBULATORY_CARE_PROVIDER_SITE_OTHER): Payer: Medicare Other

## 2021-12-20 VITALS — Ht 65.0 in | Wt 200.0 lb

## 2021-12-20 DIAGNOSIS — Z0000 Encounter for general adult medical examination without abnormal findings: Secondary | ICD-10-CM | POA: Diagnosis not present

## 2021-12-20 NOTE — Patient Instructions (Signed)
Darlene Lawrence , Thank you for taking time to come for your Medicare Wellness Visit. I appreciate your ongoing commitment to your health goals. Please review the following plan we discussed and let me know if I can assist you in the future.   Screening recommendations/referrals: Colonoscopy: not required Mammogram: completed 11/15/2021, due 11/17/2022 Bone Density: completed 03/21/2021 Recommended yearly ophthalmology/optometry visit for glaucoma screening and checkup Recommended yearly dental visit for hygiene and checkup  Vaccinations: Influenza vaccine: due Pneumococcal vaccine: completed 07/04/2014 Tdap vaccine: due Shingles vaccine: discussed   Covid-19: 06/24/2019, 06/03/2019  Advanced directives: Please bring a copy of your POA (Power of Attorney) and/or Living Will to your next appointment.   Conditions/risks identified: none  Next appointment: Follow up in one year for your annual wellness visit    Preventive Care 65 Years and Older, Female Preventive care refers to lifestyle choices and visits with your health care provider that can promote health and wellness. What does preventive care include? A yearly physical exam. This is also called an annual well check. Dental exams once or twice a year. Routine eye exams. Ask your health care provider how often you should have your eyes checked. Personal lifestyle choices, including: Daily care of your teeth and gums. Regular physical activity. Eating a healthy diet. Avoiding tobacco and drug use. Limiting alcohol use. Practicing safe sex. Taking low-dose aspirin every day. Taking vitamin and mineral supplements as recommended by your health care provider. What happens during an annual well check? The services and screenings done by your health care provider during your annual well check will depend on your age, overall health, lifestyle risk factors, and family history of disease. Counseling  Your health care provider may ask you  questions about your: Alcohol use. Tobacco use. Drug use. Emotional well-being. Home and relationship well-being. Sexual activity. Eating habits. History of falls. Memory and ability to understand (cognition). Work and work Statistician. Reproductive health. Screening  You may have the following tests or measurements: Height, weight, and BMI. Blood pressure. Lipid and cholesterol levels. These may be checked every 5 years, or more frequently if you are over 79 years old. Skin check. Lung cancer screening. You may have this screening every year starting at age 79 if you have a 30-pack-year history of smoking and currently smoke or have quit within the past 15 years. Fecal occult blood test (FOBT) of the stool. You may have this test every year starting at age 79. Flexible sigmoidoscopy or colonoscopy. You may have a sigmoidoscopy every 5 years or a colonoscopy every 10 years starting at age 79. Hepatitis C blood test. Hepatitis B blood test. Sexually transmitted disease (STD) testing. Diabetes screening. This is done by checking your blood sugar (glucose) after you have not eaten for a while (fasting). You may have this done every 1-3 years. Bone density scan. This is done to screen for osteoporosis. You may have this done starting at age 79. Mammogram. This may be done every 1-2 years. Talk to your health care provider about how often you should have regular mammograms. Talk with your health care provider about your test results, treatment options, and if necessary, the need for more tests. Vaccines  Your health care provider may recommend certain vaccines, such as: Influenza vaccine. This is recommended every year. Tetanus, diphtheria, and acellular pertussis (Tdap, Td) vaccine. You may need a Td booster every 10 years. Zoster vaccine. You may need this after age 79. Pneumococcal 13-valent conjugate (PCV13) vaccine. One dose is recommended after  age 79. Pneumococcal polysaccharide  (PPSV23) vaccine. One dose is recommended after age 79. Talk to your health care provider about which screenings and vaccines you need and how often you need them. This information is not intended to replace advice given to you by your health care provider. Make sure you discuss any questions you have with your health care provider. Document Released: 05/25/2015 Document Revised: 01/16/2016 Document Reviewed: 02/27/2015 Elsevier Interactive Patient Education  2017 [place] Prevention in the Home Falls can cause injuries. They can happen to people of all ages. There are many things you can do to make your home safe and to help prevent falls. What can I do on the outside of my home? Regularly fix the edges of walkways and driveways and fix any cracks. Remove anything that might make you trip as you walk through a door, such as a raised step or threshold. Trim any bushes or trees on the path to your home. Use bright outdoor lighting. Clear any walking paths of anything that might make someone trip, such as rocks or tools. Regularly check to see if handrails are loose or broken. Make sure that both sides of any steps have handrails. Any raised decks and porches should have guardrails on the edges. Have any leaves, snow, or ice cleared regularly. Use sand or salt on walking paths during winter. Clean up any spills in your garage right away. This includes oil or grease spills. What can I do in the bathroom? Use night lights. Install grab bars by the toilet and in the tub and shower. Do not use towel bars as grab bars. Use non-skid mats or decals in the tub or shower. If you need to sit down in the shower, use a plastic, non-slip stool. Keep the floor dry. Clean up any water that spills on the floor as soon as it happens. Remove soap buildup in the tub or shower regularly. Attach bath mats securely with double-sided non-slip rug tape. Do not have throw rugs and other things on the  floor that can make you trip. What can I do in the bedroom? Use night lights. Make sure that you have a light by your bed that is easy to reach. Do not use any sheets or blankets that are too big for your bed. They should not hang down onto the floor. Have a firm chair that has side arms. You can use this for support while you get dressed. Do not have throw rugs and other things on the floor that can make you trip. What can I do in the kitchen? Clean up any spills right away. Avoid walking on wet floors. Keep items that you use a lot in easy-to-reach places. If you need to reach something above you, use a strong step stool that has a grab bar. Keep electrical cords out of the way. Do not use floor polish or wax that makes floors slippery. If you must use wax, use non-skid floor wax. Do not have throw rugs and other things on the floor that can make you trip. What can I do with my stairs? Do not leave any items on the stairs. Make sure that there are handrails on both sides of the stairs and use them. Fix handrails that are broken or loose. Make sure that handrails are as long as the stairways. Check any carpeting to make sure that it is firmly attached to the stairs. Fix any carpet that is loose or worn. Avoid having throw rugs  at the top or bottom of the stairs. If you do have throw rugs, attach them to the floor with carpet tape. Make sure that you have a light switch at the top of the stairs and the bottom of the stairs. If you do not have them, ask someone to add them for you. What else can I do to help prevent falls? Wear shoes that: Do not have high heels. Have rubber bottoms. Are comfortable and fit you well. Are closed at the toe. Do not wear sandals. If you use a stepladder: Make sure that it is fully opened. Do not climb a closed stepladder. Make sure that both sides of the stepladder are locked into place. Ask someone to hold it for you, if possible. Clearly mark and make  sure that you can see: Any grab bars or handrails. First and last steps. Where the edge of each step is. Use tools that help you move around (mobility aids) if they are needed. These include: Canes. Walkers. Scooters. Crutches. Turn on the lights when you go into a dark area. Replace any light bulbs as soon as they burn out. Set up your furniture so you have a clear path. Avoid moving your furniture around. If any of your floors are uneven, fix them. If there are any pets around you, be aware of where they are. Review your medicines with your doctor. Some medicines can make you feel dizzy. This can increase your chance of falling. Ask your doctor what other things that you can do to help prevent falls. This information is not intended to replace advice given to you by your health care provider. Make sure you discuss any questions you have with your health care provider. Document Released: 02/22/2009 Document Revised: 10/04/2015 Document Reviewed: 06/02/2014 Elsevier Interactive Patient Education  2017 [employer].

## 2021-12-20 NOTE — Progress Notes (Signed)
I connected with Darlene Lawrence today by telephone and verified that I am speaking with the correct person using two identifiers. Location patient: home Location provider: work Persons participating in the virtual visit: Vernetta, Dizdarevic LPN.   I discussed the limitations, risks, security and privacy concerns of performing an evaluation and management service by telephone and the availability of in person appointments. I also discussed with the patient that there may be a patient responsible charge related to this service. The patient expressed understanding and verbally consented to this telephonic visit.    Interactive audio and video telecommunications were attempted between this provider and patient, however failed, due to patient having technical difficulties OR patient did not have access to video capability.  We continued and completed visit with audio only.     Vital signs may be patient reported or missing.  Subjective:   Darlene Lawrence is a 81 y.o. female who presents for Medicare Annual (Subsequent) preventive examination.  Review of Systems     Cardiac Risk Factors include: advanced age (>28mn, >>73women);dyslipidemia;hypertension;obesity (BMI >30kg/m2)     Objective:    Today's Vitals   12/20/21 0811  Weight: 200 lb (90.7 kg)  Height: '5\' 5"'$  (1.651 m)   Body mass index is 33.28 kg/m.     12/20/2021    8:14 AM 12/10/2020    9:00 AM 11/30/2019    9:46 AM 11/06/2017   10:16 AM 11/06/2016    9:51 AM 11/03/2016   10:08 AM 10/20/2016    2:30 PM  Advanced Directives  Does Patient Have a Medical Advance Directive? Yes Yes Yes Yes Yes Yes Yes  Type of AParamedicof AQuinebaugLiving will HOak ForestLiving will HIrvingtonLiving will HPalm SpringsLiving will HLeasburgLiving will HBuffalo SpringsLiving will HHudson LakeLiving will  Copy  of HParkdalein Chart? No - copy requested No - copy requested No - copy requested Yes  Yes     Current Medications (verified) Outpatient Encounter Medications as of 12/20/2021  Medication Sig   Aspirin (ASPIR-81 PO) Take 1 tablet by mouth.   b complex vitamins tablet Take 1 tablet by mouth daily.   betamethasone dipropionate 0.05 % cream Apply topically 2 (two) times daily.   Calcium Citrate-Vitamin D (CALCIUM CITRATE + PO) Take 1 tablet by mouth daily.   Cholecalciferol (VITAMIN D3) 2000 UNITS TABS Take 1 capsule by mouth 2 (two) times daily.   clindamycin (CLEOCIN) 150 MG capsule Take 150 mg by mouth as needed. Takes 4 tablets before a dental procedure   Coenzyme Q10 (COQ10) 100 MG CAPS Take 1 capsule by mouth daily.   Misc Natural Products (GLUCOSAMINE CHOND MSM FORMULA PO) Take 1,500 mg by mouth.   Multiple Vitamin (MULTI VITAMIN DAILY PO) Take 1 tablet by mouth.   Omega-3 Fatty Acids (FISH OIL) 1200 MG CAPS Take 1 capsule by mouth.   rosuvastatin (CRESTOR) 10 MG tablet Take 10 mg by mouth every Monday, Wednesday, and Friday.   triamterene-hydrochlorothiazide (MAXZIDE-25) 37.5-25 MG tablet TAKE 1/2 TABLET BY MOUTH EVERY DAY   No facility-administered encounter medications on file as of 12/20/2021.    Allergies (verified) Cefixime, Levaquin [levofloxacin], and Penicillins   History: Past Medical History:  Diagnosis Date   Abnormal Pap smear    Arthritis    breast cancer 2009   right   Cancer ([facility]    CHF (congestive heart failure) ([place]ra[place]  DDD (degenerative disc disease), lumbar    High cholesterol    Hyperlipidemia    Hypertension    Meniscus tear    right   Personal history of radiation therapy    RBBB    Past Surgical History:  Procedure Laterality Date   BREAST BIOPSY Right 08/18/2007   BREAST LUMPECTOMY Right 2009   BREAST SURGERY Right 2009   lumpectomy, no radiation, no chemo   DILATION AND CURETTAGE OF UTERUS  2012   Dr. Fermin Schwab    HERNIA REPAIR     umbilical   JOINT REPLACEMENT Left    hip   LEFT HEART CATH AND CORONARY ANGIOGRAPHY N/A 09/17/2016   Procedure: Left Heart Cath and Coronary Angiography;  Surgeon: Teodoro Spray, MD;  Location: [place] [facility];  Service: Cardiovascular;  Laterality: N/A;   TOTAL HIP ARTHROPLASTY Left    TOTAL KNEE ARTHROPLASTY Right 06/06/2013   Procedure: RIGHT TOTAL KNEE ARTHROPLASTY;  Surgeon: [name], MD;  Location: [facility];  Service: Orthopedics;  Laterality: Right;   TUBAL LIGATION     Family History  Problem Relation Age of Onset   Cancer Mother        breast and colon   Breast cancer Mother    Hypertension Father    Heart disease Father    Thyroid disease Father    Down syndrome Son    Asthma Other    Social History   Socioeconomic History   Marital status: Married    Spouse name: Not on file   Number of children: 2   Years of education: Not on file   Highest education level: Not on file  Occupational History   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  Tobacco Use   Smoking status: Never   Smokeless tobacco: Never  Vaping Use   Vaping Use: Never used  Substance and Sexual Activity   Alcohol use: Yes    Comment: once a month   Drug use: No   Sexual activity: Yes    Partners: Male    Birth control/protection: Post-menopausal  Other Topics Concern   Not on file  Social History Narrative   Regular exercise--yes, curves 3 times a week   Diet-- fruits and veggies, water, no fast food       Social Determinants of Health   Financial Resource Strain: Low Risk  (12/20/2021)   Overall Financial Resource Strain (CARDIA)    Difficulty of Paying Living Expenses: Not hard at all  Food Insecurity: No Food Insecurity (12/20/2021)   Hunger Vital Sign    Worried About Running Out of Food in the Last Year: Never true    [place] in the Last Year: Never true  Transportation Needs: No Transportation Needs (12/20/2021)    PRAPARE - Hydrologist (Medical): No    Lack of Transportation (Non-Medical): No  Physical Activity: Inactive (12/20/2021)   Exercise Vital Sign    Days of Exercise per Week: 0 days    Minutes of Exercise per Session: 0 min  Stress: No Stress Concern Present (12/20/2021)   [place]    Feeling of Stress : Not at all  Social Connections: Not on file    Tobacco Counseling Counseling given: Not Answered   Clinical Intake:  Pre-visit preparation completed: Yes  Pain : No/denies pain     Nutritional Status: BMI > 30  Obese Nutritional Risks: None Diabetes: No  How often do you need to have someone help you when you read instructions, pamphlets, or other written materials from your doctor or pharmacy?: 1 - Never What is the last grade level you completed in school?: 14 yrs  Diabetic? no  Interpreter Needed?: No  Information entered by :: NAllen LPN   Activities of Daily Living    12/20/2021    8:15 AM  In your present state of health, do you have any difficulty performing the following activities:  Hearing? 1  Comment getting a tube placed in ear on the 15th  Vision? 0  Difficulty concentrating or making decisions? 1  Walking or climbing stairs? 0  Dressing or bathing? 0  Doing errands, shopping? 0  Preparing Food and eating ? N  Using the Toilet? N  In the past six months, have you accidently leaked urine? Y  Do you have problems with loss of bowel control? N  Managing your Medications? N  Managing your Finances? N  Housekeeping or managing your Housekeeping? N    Patient Care Team: Jinny Sanders, MD as PCP - General Nicholas Lose, MD as Consulting Physician (Hematology and Oncology) Gaynelle Arabian, MD as Consulting Physician (Orthopedic Surgery) Emily Filbert, MD as Consulting Physician (Obstetrics and Gynecology) Lupita Raider, DO as Referring Physician  (Optometry) Alisa Graff, FNP as Nurse Practitioner (Family Medicine) Ubaldo Glassing Javier Docker, MD as Consulting Physician (Cardiology)  Indicate any recent Medical Services you may have received from other than [facility] providers in the past year (date may be approximate).     Assessment:   This is a routine wellness examination for [place].  Hearing/Vision screen Vision Screening - Comments:: Regular eye exams, [facility]  Dietary issues and exercise activities discussed: Current Exercise Habits: The patient does not participate in regular exercise at present   Goals Addressed             This Visit's Progress    Patient Stated       12/20/2021, no goals       Depression Screen    12/20/2021    8:15 AM 12/10/2020    9:01 AM 11/30/2019    9:46 AM 11/30/2018   10:22 AM 11/06/2017   10:03 AM 12/25/2016    8:52 AM 11/06/2016    9:53 AM  PHQ 2/9 Scores  PHQ - 2 Score 0 0 0 0 0 0 0  PHQ- 9 Score  0 0  0 2     Fall Risk    12/20/2021    8:15 AM 12/10/2020    9:01 AM 11/30/2019    9:46 AM 04/06/2019    9:55 AM 11/30/2018   10:22 AM  Fall Risk   Falls in the past year? 0 0 0 1 0  Comment    Emmi Telephone Survey: data to providers prior to load   Number falls in past yr: 0 0 0 1   Comment    Emmi Telephone Survey Actual Response = 1   Injury with Fall? 0 0 0 1   Risk for fall due to : Medication side effect Medication side effect Medication side effect    Follow up Falls evaluation completed;Education provided;Falls prevention discussed Falls evaluation completed;Falls prevention discussed Falls evaluation completed;Falls prevention discussed      FALL RISK PREVENTION PERTAINING TO THE HOME:  Any stairs in or around the home? Yes  If so, are there any without handrails? No  Home free of loose throw rugs in walkways, pet beds, electrical cords, etc? Yes  Adequate lighting in your home to reduce risk of falls? Yes   ASSISTIVE DEVICES UTILIZED TO PREVENT FALLS:  Life  alert? No  Use of a cane, walker or w/c? No  Grab bars in the bathroom? No  Shower chair or bench in shower? Yes  Elevated toilet seat or a handicapped toilet? Yes   TIMED UP AND GO:  Was the test performed? No .      Cognitive Function:    12/10/2020    9:03 AM 11/30/2019    9:51 AM 11/06/2017   10:05 AM 11/03/2016   10:20 AM 10/11/2015    9:40 AM  MMSE - Mini Mental State Exam  Not completed: Refused      Orientation to time  '5 5 5 5  '$ Orientation to Place  '5 5 5 5  '$ Registration  '3 3 3 3  '$ Attention/ Calculation  5 0 0 0  Recall  '3 3 3 3  '$ Language- name 2 objects   0 0 0  Language- repeat  '1 1 1 1  '$ Language- follow 3 step command   '3 3 3  '$ Language- read & follow direction   0 0 0  Write a sentence   0 0 0  Copy design   0 0 0  Total score   '20 20 20        '$ 12/20/2021    8:16 AM  6CIT Screen  What Year? 0 points  What month? 0 points  What time? 0 points  Count back from 20 0 points  Months in reverse 0 points  Repeat phrase 0 points  Total Score 0 points    Immunizations Immunization History  Administered Date(s) Administered   Influenza Split 02/19/2011, 02/23/2012   Influenza Whole 02/10/2007, 02/15/2008, 03/06/2010   Influenza,inj,Quad PF,6+ Mos 02/20/2014, 03/10/2017   Influenza-Unspecified 02/05/2016, 05/12/2018   PFIZER(Purple Top)SARS-COV-2 Vaccination 06/03/2019, 06/24/2019   Pneumococcal Conjugate-13 07/04/2014   Pneumococcal Polysaccharide-23 07/11/2008   Td 09/09/2000   Tdap 11/11/2011   Zoster, Live 11/14/2009    TDAP status: Due, Education has been provided regarding the importance of this vaccine. Advised may receive this vaccine at local pharmacy or Health Dept. Aware to provide a copy of the vaccination record if obtained from local pharmacy or Health Dept. Verbalized acceptance and understanding.  Flu Vaccine status: Due, Education has been provided regarding the importance of this vaccine. Advised may receive this vaccine at local pharmacy  or Health Dept. Aware to provide a copy of the vaccination record if obtained from local pharmacy or Health Dept. Verbalized acceptance and understanding.  Pneumococcal vaccine status: Up to date  Covid-19 vaccine status: Completed vaccines  Qualifies for Shingles Vaccine? Yes   Zostavax completed Yes   Shingrix Completed?: No.    Education has been provided regarding the importance of this vaccine. Patient has been advised to call insurance company to determine out of pocket expense if they have not yet received this vaccine. Advised may also receive vaccine at local pharmacy or Health Dept. Verbalized acceptance and understanding.  Screening Tests Health Maintenance  Topic Date Due   Zoster Vaccines- Shingrix (1 of 2) Never done   COVID-19 Vaccine (3 - Pfizer series) 08/19/2019   TETANUS/TDAP  11/10/2021   INFLUENZA VACCINE  12/10/2021   MAMMOGRAM  11/16/2022   Pneumonia Vaccine 54+ Years old  Completed   DEXA SCAN  Completed   Hepatitis C  Screening  Completed   HPV VACCINES  Aged Out   COLONOSCOPY (Pts 45-73yr Insurance coverage will need to be confirmed)  Discontinued    Health Maintenance  Health Maintenance Due  Topic Date Due   Zoster Vaccines- Shingrix (1 of 2) Never done   COVID-19 Vaccine (3 - Pfizer series) 08/19/2019   TETANUS/TDAP  11/10/2021   INFLUENZA VACCINE  12/10/2021    Colorectal cancer screening: No longer required.   Mammogram status: Completed 11/15/2021. Repeat every year  Bone Density status: Completed 03/21/2021.   Lung Cancer Screening: (Low Dose CT Chest recommended if Age 79-80years, 30 pack-year currently smoking OR have quit w/in 15years.) does not qualify.   Lung Cancer Screening Referral: no  Additional Screening:  Hepatitis C Screening: does qualify; Completed 12/06/2019  Vision Screening: Recommended annual ophthalmology exams for early detection of glaucoma and other disorders of the eye. Is the patient up to date with their annual  eye exam?  Yes  Who is the provider or what is the name of the office in which the patient attends annual eye exams[facility] provider, would they like to be referred to a provider to establish care? No .   Dental Screening: Recommended annual dental exams for proper oral hygiene  Community Resource Referral / Chronic Care Management: CRR required this visit?  No   CCM required this visit?  No      Plan:     I have personally reviewed and noted the following in the patient's chart:   Medical and social history Use of alcohol, tobacco or illicit drugs  Current medications and supplements including opioid prescriptions.  Functional ability and status Nutritional status Physical activity Advanced directives List of other physicians Hospitalizations, surgeries, and ER visits in previous 12 months Vitals Screenings to include cognitive, depression, and falls Referrals and appointments  In addition, I have reviewed and discussed with patient certain preventive protocols, quality metrics, and best practice recommendations. A written personalized care plan for preventive services as well as general preventive health recommendations were provided to patient.     NKellie Simmering LPN   [id number]  Nurse Notes: none  Due to this being a virtual visit, the after visit summary with patients personalized plan was offered to patient via mail or my-chart. Patient would like to access on my-chart

## 2021-12-24 DIAGNOSIS — H6982 Other specified disorders of Eustachian tube, left ear: Secondary | ICD-10-CM | POA: Diagnosis not present

## 2022-01-04 ENCOUNTER — Telehealth: Payer: Self-pay | Admitting: Family Medicine

## 2022-01-04 DIAGNOSIS — E782 Mixed hyperlipidemia: Secondary | ICD-10-CM

## 2022-01-04 NOTE — Telephone Encounter (Signed)
-----   Message from Ellamae Sia sent at 12/30/2021 12:25 PM EDT ----- Regarding: Lab orders for Thursday, 8.31.23 Patient is scheduled for CPX labs, please order future labs, Thanks , Karna Christmas

## 2022-01-06 DIAGNOSIS — M9903 Segmental and somatic dysfunction of lumbar region: Secondary | ICD-10-CM | POA: Diagnosis not present

## 2022-01-06 DIAGNOSIS — M62830 Muscle spasm of back: Secondary | ICD-10-CM | POA: Diagnosis not present

## 2022-01-06 DIAGNOSIS — M5136 Other intervertebral disc degeneration, lumbar region: Secondary | ICD-10-CM | POA: Diagnosis not present

## 2022-01-06 DIAGNOSIS — M9902 Segmental and somatic dysfunction of thoracic region: Secondary | ICD-10-CM | POA: Diagnosis not present

## 2022-01-09 ENCOUNTER — Other Ambulatory Visit (INDEPENDENT_AMBULATORY_CARE_PROVIDER_SITE_OTHER): Payer: Medicare Other

## 2022-01-09 DIAGNOSIS — E782 Mixed hyperlipidemia: Secondary | ICD-10-CM

## 2022-01-09 LAB — COMPREHENSIVE METABOLIC PANEL
ALT: 34 U/L (ref 0–35)
AST: 28 U/L (ref 0–37)
Albumin: 3.8 g/dL (ref 3.5–5.2)
Alkaline Phosphatase: 50 U/L (ref 39–117)
BUN: 30 mg/dL — ABNORMAL HIGH (ref 6–23)
CO2: 25 mEq/L (ref 19–32)
Calcium: 9.2 mg/dL (ref 8.4–10.5)
Chloride: 105 mEq/L (ref 96–112)
Creatinine, Ser: 1.24 mg/dL — ABNORMAL HIGH (ref 0.40–1.20)
GFR: 41.47 mL/min — ABNORMAL LOW (ref >60.00)
Glucose, Bld: 97 mg/dL (ref 70–99)
Potassium: 3.8 mEq/L (ref 3.5–5.1)
Sodium: 140 mEq/L (ref 135–145)
Total Bilirubin: 0.5 mg/dL (ref 0.2–1.2)
Total Protein: 7.3 g/dL (ref 6.0–8.3)

## 2022-01-09 LAB — LIPID PANEL
Cholesterol: 139 mg/dL (ref 0–200)
HDL: 38 mg/dL — ABNORMAL LOW (ref >39.00)
LDL Cholesterol: 68 mg/dL (ref 0–99)
NonHDL: 100.83
Total CHOL/HDL Ratio: 4
Triglycerides: 163 mg/dL — ABNORMAL HIGH (ref 0.0–149.0)
VLDL: 32.6 mg/dL (ref 0.0–40.0)

## 2022-01-09 NOTE — Progress Notes (Signed)
No critical labs need to be addressed urgently. We will discuss labs in detail at upcoming office visit.   

## 2022-01-14 ENCOUNTER — Ambulatory Visit (INDEPENDENT_AMBULATORY_CARE_PROVIDER_SITE_OTHER): Payer: Medicare Other | Admitting: Family Medicine

## 2022-01-14 ENCOUNTER — Encounter: Payer: Self-pay | Admitting: Family Medicine

## 2022-01-14 DIAGNOSIS — I5033 Acute on chronic diastolic (congestive) heart failure: Secondary | ICD-10-CM

## 2022-01-14 DIAGNOSIS — N1831 Chronic kidney disease, stage 3a: Secondary | ICD-10-CM | POA: Diagnosis not present

## 2022-01-14 DIAGNOSIS — E782 Mixed hyperlipidemia: Secondary | ICD-10-CM | POA: Diagnosis not present

## 2022-01-14 DIAGNOSIS — I10 Essential (primary) hypertension: Secondary | ICD-10-CM | POA: Diagnosis not present

## 2022-01-14 NOTE — Assessment & Plan Note (Signed)
Chronic, euvolemic in office today on Maxide

## 2022-01-14 NOTE — Assessment & Plan Note (Signed)
Stable at GFR 41

## 2022-01-14 NOTE — Progress Notes (Signed)
Patient ID: Darlene Lawrence, female    DOB: 05-14-1940, 82 y.o.   MRN: [id number]  This visit was conducted in person.  BP 130/78   Pulse 62   Temp 98.4 F (36.9 C) (Oral)   Ht 5' 4.5" (1.638 m)   Wt 224 lb (101.6 kg)   LMP  (LMP Unknown)   SpO2 95%   BMI 37.86 kg/m    CC:   Chief Complaint  Patient presents with   Annual Exam    Part 2    Subjective:   HPI: Darlene Lawrence is a 81 y.o. female presenting on 01/14/2022 for Annual Exam (Part 2)  The patient presents for   review of chronic health problems. He/She also has the following acute concerns today:  The patient saw a LPN or RN for medicare wellness visit.  Prevention and wellness was reviewed in detail. Note reviewed and important notes copied below.  CKD stage 3: Stable avoiding nonsteroidal anti-inflammatories.  GFR 41  Hypertension: Blood pressure well controlled on triamterene hydrochlorothiazide 37.5 mg / 25 mg p.o. daily BP Readings from Last 3 Encounters:  01/14/22 130/78  01/08/21 140/78  08/29/20 125/79  Using medication without problems or lightheadedness: None Chest pain with exertion: None Edema: Intermittent Short of breath: None Average home BPs: not checking. Other issues:  HFpEF: Euvolemic in office today using Maxide.  Elevated Cholesterol: LDL at goal less than 70 on Crestor 3 days a week Lab Results  Component Value Date   CHOL 139 01/09/2022   HDL 38.00 (L) 01/09/2022   LDLCALC 68 01/09/2022   TRIG 163.0 (H) 01/09/2022   CHOLHDL 4 01/09/2022  Using medications without problems: none Muscle aches:  Diet compliance: moderate Exercise: minimal Other complaints:   Has varicose veins.Marland Kitchen only mildly painful.      Relevant past medical, surgical, family and social history reviewed and updated as indicated. Interim medical history since our last visit reviewed. Allergies and medications reviewed and updated. Outpatient Medications Prior to Visit  Medication Sig  Dispense Refill   Aspirin (ASPIR-81 PO) Take 1 tablet by mouth.     b complex vitamins tablet Take 1 tablet by mouth daily.     betamethasone dipropionate 0.05 % cream Apply topically 2 (two) times daily. 30 g 0   Calcium Citrate-Vitamin D (CALCIUM CITRATE + PO) Take 1 tablet by mouth daily.     Cholecalciferol (VITAMIN D3) 2000 UNITS TABS Take 1 capsule by mouth 2 (two) times daily.     clindamycin (CLEOCIN) 150 MG capsule Take 150 mg by mouth as needed. Takes 4 tablets before a dental procedure     Coenzyme Q10 (COQ10) 100 MG CAPS Take 1 capsule by mouth daily.     Misc Natural Products (GLUCOSAMINE CHOND MSM FORMULA PO) Take 1,500 mg by mouth.     Multiple Vitamin (MULTI VITAMIN DAILY PO) Take 1 tablet by mouth.     Omega-3 Fatty Acids (FISH OIL) 1200 MG CAPS Take 1 capsule by mouth.     rosuvastatin (CRESTOR) 10 MG tablet Take 10 mg by mouth every Monday, Wednesday, and Friday.     triamterene-hydrochlorothiazide (MAXZIDE-25) 37.5-25 MG tablet TAKE 1/2 TABLET BY MOUTH EVERY DAY 45 tablet 3   No facility-administered medications prior to visit.     Per HPI unless specifically indicated in ROS section below Review of Systems  Constitutional:  Negative for fatigue and fever.  HENT:  Negative for congestion.   Eyes:  Negative for pain.  Respiratory:  Negative for cough and shortness of breath.   Cardiovascular:  Negative for chest pain, palpitations and leg swelling.  Gastrointestinal:  Negative for abdominal pain.  Genitourinary:  Negative for dysuria and vaginal bleeding.  Musculoskeletal:  Negative for back pain.  Neurological:  Negative for syncope, light-headedness and headaches.  Psychiatric/Behavioral:  Negative for dysphoric mood.    Objective:  BP 130/78   Pulse 62   Temp 98.4 F (36.9 C) (Oral)   Ht 5' 4.5" (1.638 m)   Wt 224 lb (101.6 kg)   LMP  (LMP Unknown)   SpO2 95%   BMI 37.86 kg/m   Wt Readings from Last 3 Encounters:  01/14/22 224 lb (101.6 kg)  12/20/21  200 lb (90.7 kg)  01/08/21 219 lb 12 oz (99.7 kg)      Physical Exam Vitals and nursing note reviewed.  Constitutional:      General: She is not in acute distress.    Appearance: Normal appearance. She is well-developed. She is obese. She is not ill-appearing or toxic-appearing.  HENT:     Head: Normocephalic.     Right Ear: Hearing, tympanic membrane, ear canal and external ear normal.     Left Ear: Hearing, tympanic membrane, ear canal and external ear normal.     Nose: Nose normal.  Eyes:     General: Lids are normal. Lids are everted, no foreign bodies appreciated.     Conjunctiva/sclera: Conjunctivae normal.     Pupils: Pupils are equal, round, and reactive to light.  Neck:     Thyroid: No thyroid mass or thyromegaly.     Vascular: No carotid bruit.     Trachea: Trachea normal.  Cardiovascular:     Rate and Rhythm: Normal rate and regular rhythm.     Heart sounds: Normal heart sounds, S1 normal and S2 normal. No murmur heard.    No gallop.  Pulmonary:     Effort: Pulmonary effort is normal. No respiratory distress.     Breath sounds: Normal breath sounds. No wheezing, rhonchi or rales.  Abdominal:     General: Bowel sounds are normal. There is no distension or abdominal bruit.     Palpations: Abdomen is soft. There is no fluid wave or mass.     Tenderness: There is no abdominal tenderness. There is no guarding or rebound.     Hernia: No hernia is present.  Musculoskeletal:     Cervical back: Normal range of motion and neck supple.  Lymphadenopathy:     Cervical: No cervical adenopathy.  Skin:    General: Skin is warm and dry.     Findings: No rash.  Neurological:     Mental Status: She is alert.     Cranial Nerves: No cranial nerve deficit.     Sensory: No sensory deficit.  Psychiatric:        Mood and Affect: Mood is not anxious or depressed.        Speech: Speech normal.        Behavior: Behavior normal. Behavior is cooperative.        Judgment: Judgment  normal.       Results for orders placed or performed in visit on 01/09/22  Comprehensive metabolic panel  Result Value Ref Range   Sodium 140 135 - 145 mEq/L   Potassium 3.8 3.5 - 5.1 mEq/L   Chloride 105 96 - 112 mEq/L   CO2 25 19 - 32 mEq/L   Glucose, Bld 97 70 -  99 mg/dL   BUN 30 (H) 6 - 23 mg/dL   Creatinine, Ser 1.24 (H) 0.40 - 1.20 mg/dL   Total Bilirubin 0.5 0.2 - 1.2 mg/dL   Alkaline Phosphatase 50 39 - 117 U/L   AST 28 0 - 37 U/L   ALT 34 0 - 35 U/L   Total Protein 7.3 6.0 - 8.3 g/dL   Albumin 3.8 3.5 - 5.2 g/dL   GFR 41.47 (L) >60.00 mL/min   Calcium 9.2 8.4 - 10.5 mg/dL  Lipid panel  Result Value Ref Range   Cholesterol 139 0 - 200 mg/dL   Triglycerides 163.0 (H) 0.0 - 149.0 mg/dL   HDL 38.00 (L) >39.00 mg/dL   VLDL 32.6 0.0 - 40.0 mg/dL   LDL Cholesterol 68 0 - 99 mg/dL   Total CHOL/HDL Ratio 4    NonHDL 100.83      COVID 19 screen:  No recent travel or known exposure to COVID19 The patient denies respiratory symptoms of COVID 19 at this time. The importance of social distancing was discussed today.   Assessment and Plan   The patient's preventative maintenance and recommended screening tests for an annual wellness exam were reviewed in full today. Brought up to date unless services declined.  Counselled on the importance of diet, exercise, and its role in overall health and mortality. The patient's FH and SH was reviewed, including their home life, tobacco status, and drug and alcohol status.   Vaccines: Uptodate . S/P COVID19 vaccine x 2, consider shingrix.  Due for flu vaccine but unavailable at our office and high-dose form at this time.  PAP/DVE: Per Dr. Hulan Fray.  Following uterine prolapse. Decided to not move ahead with hysterectomy given SE risks.   Breast exam/mammogram:  11/2021 hx of breast cancer 2009 Colonoscopy: Dr. Cristina Gong  12/04/20 normal, no further indicated Nonsmoker   DEXA:  stable osteopenia in 2022, repeat in 2 to 5 years Hep C done    Problem List Items Addressed This Visit     CKD (chronic kidney disease) stage 3, GFR 30-59 ml/min (HCC) (Chronic)    Stable at GFR 41      Essential hypertension (Chronic)    Stable, chronic.  Continue current medication.  well controlled on triamterene hydrochlorothiazide 37.5 mg / 25 mg p.o. daily       Heart failure with preserved ejection fraction (HCC) (Chronic)    Chronic, euvolemic in office today on Maxide      Hyperlipidemia (Chronic)    Stable, chronic.  Continue current medication.  Crestor 10 mg on Monday Wednesday Friday          Eliezer Lofts, MD

## 2022-01-14 NOTE — Assessment & Plan Note (Signed)
Stable, chronic.  Continue current medication.  Crestor 10 mg on Monday Wednesday Friday

## 2022-01-14 NOTE — Assessment & Plan Note (Signed)
Stable, chronic.  Continue current medication.  well controlled on triamterene hydrochlorothiazide 37.5 mg / 25 mg p.o. daily

## 2022-01-14 NOTE — Patient Instructions (Addendum)
Start walking 3-5 day a week 30-50 min each time.  Keep working on healthy eating and weight management.

## 2022-01-17 DIAGNOSIS — L603 Nail dystrophy: Secondary | ICD-10-CM | POA: Diagnosis not present

## 2022-02-03 DIAGNOSIS — M9902 Segmental and somatic dysfunction of thoracic region: Secondary | ICD-10-CM | POA: Diagnosis not present

## 2022-02-03 DIAGNOSIS — M62830 Muscle spasm of back: Secondary | ICD-10-CM | POA: Diagnosis not present

## 2022-02-03 DIAGNOSIS — M9903 Segmental and somatic dysfunction of lumbar region: Secondary | ICD-10-CM | POA: Diagnosis not present

## 2022-02-03 DIAGNOSIS — M5136 Other intervertebral disc degeneration, lumbar region: Secondary | ICD-10-CM | POA: Diagnosis not present

## 2022-02-03 DIAGNOSIS — Z23 Encounter for immunization: Secondary | ICD-10-CM | POA: Diagnosis not present

## 2022-02-06 DIAGNOSIS — H2513 Age-related nuclear cataract, bilateral: Secondary | ICD-10-CM | POA: Diagnosis not present

## 2022-02-27 ENCOUNTER — Other Ambulatory Visit: Payer: Self-pay | Admitting: Family Medicine

## 2022-03-10 DIAGNOSIS — M9903 Segmental and somatic dysfunction of lumbar region: Secondary | ICD-10-CM | POA: Diagnosis not present

## 2022-03-10 DIAGNOSIS — M9902 Segmental and somatic dysfunction of thoracic region: Secondary | ICD-10-CM | POA: Diagnosis not present

## 2022-03-10 DIAGNOSIS — M5136 Other intervertebral disc degeneration, lumbar region: Secondary | ICD-10-CM | POA: Diagnosis not present

## 2022-03-10 DIAGNOSIS — M62830 Muscle spasm of back: Secondary | ICD-10-CM | POA: Diagnosis not present

## 2022-04-04 ENCOUNTER — Emergency Department (HOSPITAL_BASED_OUTPATIENT_CLINIC_OR_DEPARTMENT_OTHER)
Admission: EM | Admit: 2022-04-04 | Discharge: 2022-04-04 | Disposition: A | Discharge status: Home / Self Care | Payer: Medicare Other | Attending: Emergency Medicine | Admitting: Emergency Medicine

## 2022-04-04 ENCOUNTER — Encounter (HOSPITAL_BASED_OUTPATIENT_CLINIC_OR_DEPARTMENT_OTHER): Payer: Self-pay

## 2022-04-04 ENCOUNTER — Emergency Department (HOSPITAL_BASED_OUTPATIENT_CLINIC_OR_DEPARTMENT_OTHER): Payer: Medicare Other

## 2022-04-04 ENCOUNTER — Other Ambulatory Visit: Payer: Self-pay

## 2022-04-04 ENCOUNTER — Other Ambulatory Visit (HOSPITAL_BASED_OUTPATIENT_CLINIC_OR_DEPARTMENT_OTHER): Payer: Self-pay

## 2022-04-04 DIAGNOSIS — Z7982 Long term (current) use of aspirin: Secondary | ICD-10-CM | POA: Diagnosis not present

## 2022-04-04 DIAGNOSIS — R1032 Left lower quadrant pain: Secondary | ICD-10-CM | POA: Diagnosis not present

## 2022-04-04 DIAGNOSIS — Z79899 Other long term (current) drug therapy: Secondary | ICD-10-CM | POA: Insufficient documentation

## 2022-04-04 DIAGNOSIS — I700 Atherosclerosis of aorta: Secondary | ICD-10-CM | POA: Diagnosis not present

## 2022-04-04 DIAGNOSIS — K760 Fatty (change of) liver, not elsewhere classified: Secondary | ICD-10-CM | POA: Diagnosis not present

## 2022-04-04 DIAGNOSIS — K529 Noninfective gastroenteritis and colitis, unspecified: Secondary | ICD-10-CM | POA: Insufficient documentation

## 2022-04-04 LAB — URINALYSIS, ROUTINE W REFLEX MICROSCOPIC
Bilirubin Urine: NEGATIVE
Glucose, UA: NEGATIVE mg/dL
Ketones, ur: NEGATIVE mg/dL
Nitrite: NEGATIVE
Protein, ur: NEGATIVE mg/dL
Specific Gravity, Urine: 1.013 (ref 1.005–1.030)
pH: 5 (ref 5.0–8.0)

## 2022-04-04 LAB — CBC WITH DIFFERENTIAL/PLATELET
Abs Immature Granulocytes: 0 10*3/uL (ref 0.00–0.07)
Basophils Absolute: 0 10*3/uL (ref 0.0–0.1)
Basophils Relative: 1 %
Eosinophils Absolute: 0.1 10*3/uL (ref 0.0–0.5)
Eosinophils Relative: 3 %
HCT: 41.5 % (ref 36.0–46.0)
Hemoglobin: 13.7 g/dL (ref 12.0–15.0)
Immature Granulocytes: 0 %
Lymphocytes Relative: 45 %
Lymphs Abs: 1.7 10*3/uL (ref 0.7–4.0)
MCH: 33.7 pg (ref 26.0–34.0)
MCHC: 33 g/dL (ref 30.0–36.0)
MCV: 102 fL — ABNORMAL HIGH (ref 80.0–100.0)
Monocytes Absolute: 0.3 10*3/uL (ref 0.1–1.0)
Monocytes Relative: 8 %
Neutro Abs: 1.7 10*3/uL (ref 1.7–7.7)
Neutrophils Relative %: 43 %
Platelets: 125 10*3/uL — ABNORMAL LOW (ref 150–400)
RBC: 4.07 MIL/uL (ref 3.87–5.11)
RDW: 13.9 % (ref 11.5–15.5)
WBC: 3.9 10*3/uL — ABNORMAL LOW (ref 4.0–10.5)
nRBC: 0 % (ref 0.0–0.2)

## 2022-04-04 LAB — COMPREHENSIVE METABOLIC PANEL
ALT: 28 U/L (ref 0–44)
AST: 22 U/L (ref 15–41)
Albumin: 4.4 g/dL (ref 3.5–5.0)
Alkaline Phosphatase: 57 U/L (ref 38–126)
Anion gap: 11 (ref 5–15)
BUN: 23 mg/dL (ref 8–23)
CO2: 26 mmol/L (ref 22–32)
Calcium: 9.4 mg/dL (ref 8.9–10.3)
Chloride: 102 mmol/L (ref 98–111)
Creatinine, Ser: 1.26 mg/dL — ABNORMAL HIGH (ref 0.44–1.00)
GFR, Estimated: 43 mL/min — ABNORMAL LOW (ref >60)
Glucose, Bld: 108 mg/dL — ABNORMAL HIGH (ref 70–99)
Potassium: 4.1 mmol/L (ref 3.5–5.1)
Sodium: 139 mmol/L (ref 135–145)
Total Bilirubin: 0.6 mg/dL (ref 0.3–1.2)
Total Protein: 8.1 g/dL (ref 6.5–8.1)

## 2022-04-04 LAB — LIPASE, BLOOD: Lipase: 26 U/L (ref 11–51)

## 2022-04-04 MED ORDER — IOHEXOL 300 MG/ML  SOLN
100.0000 mL | Freq: Once | INTRAMUSCULAR | Status: AC | PRN
  Administered 2022-04-04: 80 mL via INTRAVENOUS

## 2022-04-04 MED ORDER — AMOXICILLIN-POT CLAVULANATE 875-125 MG PO TABS: 1.0000 | ORAL_TABLET | Freq: Two times a day (BID) | ORAL | 0 refills | Status: DC

## 2022-04-04 NOTE — ED Triage Notes (Signed)
Onset Wednesday of abdominal pain  Left lower quadrant pain Denies nausea and vomiting.

## 2022-04-04 NOTE — ED Provider Notes (Signed)
[facility] DEPT Provider Note   CSN: [id number] Arrival date & time: 04/04/22  0354     History  Chief Complaint  Patient presents with   Abdominal Pain    Allysa Governale is a 81 y.o. female.  HPI Patient reports she started getting a dull achy pain in her left lateral lower abdomen about 2-1/2 days ago.  She reports it is more of a discomfort but it has not been severe pain.  However, it has been persistent and she has never had a similar pain.  No associated symptoms.  She has been eating and drinking as per usual.  Constipation or diarrhea.  No urinary symptoms.  No history of kidney stones.    Home Medications Prior to Admission medications   Medication Sig Start Date End Date Taking? Authorizing Provider  amoxicillin-clavulanate (AUGMENTIN) 875-125 MG tablet Take 1 tablet by mouth every 12 (twelve) hours. 04/04/22  Yes Charlesetta Shanks, MD  Aspirin (ASPIR-81 PO) Take 1 tablet by mouth.    [provider]  b complex vitamins tablet Take 1 tablet by mouth daily.    [provider]  betamethasone dipropionate 0.05 % cream Apply topically 2 (two) times daily. 01/08/21   Bedsole, Amy E, MD  Calcium Citrate-Vitamin D (CALCIUM CITRATE + PO) Take 1 tablet by mouth daily.    [provider]  Cholecalciferol (VITAMIN D3) 2000 UNITS TABS Take 1 capsule by mouth 2 (two) times daily.    [provider]  clindamycin (CLEOCIN) 150 MG capsule Take 150 mg by mouth as needed. Takes 4 tablets before a dental procedure    [provider]  Coenzyme Q10 (COQ10) 100 MG CAPS Take 1 capsule by mouth daily.    [provider]  Misc Natural Products (GLUCOSAMINE CHOND MSM FORMULA PO) Take 1,500 mg by mouth.    [provider]  Multiple Vitamin (MULTI VITAMIN DAILY PO) Take 1 tablet by mouth.    [provider]  Omega-3 Fatty Acids (FISH OIL) 1200 MG CAPS Take 1 capsule by mouth.    [provider]  rosuvastatin (CRESTOR) 10 MG tablet Take 10 mg by mouth every Monday, Wednesday, and Friday.    [provider]  triamterene-hydrochlorothiazide (MAXZIDE-25) 37.5-25 MG tablet TAKE 1/2 TABLET BY MOUTH EVERY DAY 02/27/22   Bedsole, Amy E, MD      Allergies    Cefixime, Ciprofloxacin, Levaquin [levofloxacin], and Penicillins    Review of Systems   Review of Systems  Physical Exam Updated Vital Signs BP (!) 192/97   Pulse 68   Temp 97.9 F (36.6 C) (Oral)   Resp 18   Ht '5\' 5"'$  (1.651 m)   Wt 97.5 kg   LMP  (LMP Unknown)   SpO2 97%   BMI 35.78 kg/m  Physical Exam Constitutional:      Comments: Alert nontoxic well in appearance.  HENT:     Mouth/Throat:     Pharynx: Oropharynx is clear.  Eyes:     Extraocular Movements: Extraocular movements intact.  Cardiovascular:     Rate and Rhythm: Normal rate and regular rhythm.  Pulmonary:     Effort: Pulmonary effort is normal.     Breath sounds: Normal breath sounds.  Abdominal:     Comments: Abdomen is soft.  Mild to moderate reproducible discomfort in the left lateral and lower quadrant.  No suprapubic pain to palpation.  No appreciable masses.  Musculoskeletal:        General: Normal  range of motion.     Comments: No peripheral edema.  Calves are soft and nontender.  Skin:    General: Skin is warm and dry.  Neurological:     General: No focal deficit present.     Mental Status: She is oriented to person, place, and time.     Motor: No weakness.     Coordination: Coordination normal.  Psychiatric:        Mood and Affect: Mood normal.     ED Results / Procedures / Treatments   Labs (all labs ordered are listed, but only abnormal results are displayed) Labs Reviewed  CBC WITH DIFFERENTIAL/PLATELET - Abnormal; Notable for the following components:      Result Value   WBC 3.9 (*)    MCV 102.0 (*)    Platelets 125 (*)    All other components within normal limits  COMPREHENSIVE METABOLIC PANEL - Abnormal;  Notable for the following components:   Glucose, Bld 108 (*)    Creatinine, Ser 1.26 (*)    GFR, Estimated 43 (*)    All other components within normal limits  URINALYSIS, ROUTINE W REFLEX MICROSCOPIC - Abnormal; Notable for the following components:   APPearance HAZY (*)    Hgb urine dipstick TRACE (*)    Leukocytes,Ua LARGE (*)    Bacteria, UA FEW (*)    All other components within normal limits  LIPASE, BLOOD    EKG None  Radiology CT Abdomen Pelvis W Contrast  Result Date: 04/04/2022 CLINICAL DATA:  Acute left lower quadrant abdominal pain. EXAM: CT ABDOMEN AND PELVIS WITH CONTRAST TECHNIQUE: Multidetector CT imaging of the abdomen and pelvis was performed using the standard protocol following bolus administration of intravenous contrast. RADIATION DOSE REDUCTION: This exam was performed according to the departmental dose-optimization program which includes automated exposure control, adjustment of the mA and/or kV according to patient size and/or use of iterative reconstruction technique. CONTRAST:  13m OMNIPAQUE IOHEXOL 300 MG/ML  SOLN COMPARISON:  None Available. FINDINGS: Lower chest: No acute abnormality. Hepatobiliary: Hepatic steatosis. Cholelithiasis is noted. No biliary dilatation is noted. Pancreas: Unremarkable. No pancreatic ductal dilatation or surrounding inflammatory changes. Spleen: Normal in size without focal abnormality. Adrenals/Urinary Tract: Adrenal glands appear normal. Large left renal cyst is noted for which no further follow-up is required. No hydronephrosis or renal obstruction is noted. Urinary bladder is unremarkable. Stomach/Bowel: Stomach is within normal limits. Appendix appears normal. There is no evidence of bowel obstruction. Mild inflammatory changes are seen around the proximal sigmoid colon without definite evidence of diverticulosis, suggesting epiploic appendagitis. Vascular/Lymphatic: Aortic atherosclerosis. No enlarged abdominal or pelvic lymph  nodes. Reproductive: Uterus and bilateral adnexa are unremarkable. Other: No abdominal wall hernia or abnormality. No abdominopelvic ascites. Musculoskeletal: Status post left total hip arthroplasty. No acute osseous abnormality is noted. IMPRESSION: Hepatic steatosis. Cholelithiasis. Mild inflammatory changes are noted around the proximal sigmoid colon without definite evidence of diverticulosis, suggesting epiploic appendagitis. Aortic Atherosclerosis (ICD10-I70.0). Electronically Signed   By: JMarijo ConceptionM.D.   On: 04/04/2022 11:50    Procedures Procedures    Medications Ordered in ED Medications  iohexol (OMNIPAQUE) 300 MG/ML solution 100 mL (80 mLs Intravenous Contrast Given 04/04/22 1131)    ED Course/ Medical Decision Making/ A&P                           Medical Decision Making Amount and/or Complexity of Data Reviewed Radiology: ordered.  Risk Prescription drug  management.   Patient presents with left lower abdominal pain.  She is 81 years of age.  With age and 2-1/2 to 3 days of abdominal pain, increased risk for possible surgical etiology such as bowel obstruction, diverticulitis, kidney stone, vascular emergency.  At this time I have lower suspicion for vascular emergency or significant obstruction with mild to moderate pain and no associated symptoms.  We will proceed with CT to further define source of pain.  Patient does not need any pain medications or other interventional treatment at this time.  CT scan reviewed by radiology shows area of mild colitis possible epiploic appendagitis with proximal sigmoid inflammatory changes.  Patient is clinically well in appearance.  With persistent pain and inflammatory changes will opt to initiate antibiotic therapy.  Patient has antibiotic allergies to ciprofloxacin, Levaquin, penicillins and cefixime.  Siltape allergy appears mild with rash.  Will opt to initiate Augmentin with careful return precautions and discontinuation if any  symptoms develop.  Patient is advised to get close follow-up with PCP for monitoring of resolution as well as possible repeat colonoscopy if indicated when symptoms resolved.        Final Clinical Impression(s) / ED Diagnoses Final diagnoses:  Colitis    Rx / DC Orders ED Discharge Orders          Ordered    amoxicillin-clavulanate (AUGMENTIN) 875-125 MG tablet  Every 12 hours        04/04/22 1248              Charlesetta Shanks, MD 04/04/22 1256

## 2022-04-04 NOTE — Discharge Instructions (Signed)
1.  You have a mild area of inflammation in the sigmoid colon.  You have allergies to the medications commonly used to treat this.  Often ciprofloxacin or Levaquin are used.  Also Augmentin can be used.  You state you have a rash with medications that are related to Augmentin.  If you start the Augmentin and develop rash, discontinue immediately. 2.  Schedule follow-up with your doctor next week for recheck.  Although you have had normal colonoscopies up until past 6 months.  You may need a repeat CT scan and colonoscopy after completing treatment. 3.  Return to the emergency department if you are developing fevers, worsening pain, vomiting or other concerning changes.  You may take over-the-counter extra strength Tylenol for pain.

## 2022-04-07 ENCOUNTER — Telehealth: Payer: Self-pay

## 2022-04-07 DIAGNOSIS — M9902 Segmental and somatic dysfunction of thoracic region: Secondary | ICD-10-CM | POA: Diagnosis not present

## 2022-04-07 DIAGNOSIS — M62830 Muscle spasm of back: Secondary | ICD-10-CM | POA: Diagnosis not present

## 2022-04-07 DIAGNOSIS — M5136 Other intervertebral disc degeneration, lumbar region: Secondary | ICD-10-CM | POA: Diagnosis not present

## 2022-04-07 DIAGNOSIS — M9903 Segmental and somatic dysfunction of lumbar region: Secondary | ICD-10-CM | POA: Diagnosis not present

## 2022-04-07 NOTE — Telephone Encounter (Signed)
Per chart review tab pt was seen at [facility] center on 04/04/22. Sending note to Dr Diona Browner and [place] pool.     [facility] Night - Client TELEPHONE ADVICE RECORD Access[facility]urse Patient Name: Darlene [name] Gender: Female DOB: Aug 10, 1940 Age: 81 Y 4 M 25 D Return Phone Number: [phone] (Primary) Address: City/ State/ Zip: [place] [address] Client [facility] Night - Client Client Site [place] Provider Eliezer Lofts - MD Contact Type Call Who Is Calling Patient / Member / Family / Caregiver Call Type Triage / Clinical Relationship To Patient Self Return Phone Number [phone] (Primary) Chief Complaint Abdominal Pain Reason for Call Symptomatic / Request for [place] states she is experiencing abdominal pain. Translation No Nurse Assessment Nurse: Waymond Cera, RN, Benjamine Mola Date/Time (Eastern Time): 04/04/2022 8:28:49 AM Confirm and document reason for call. If symptomatic, describe symptoms. ---Caller states abdominal pain "to the touch/not inside" since Wed. States pain in left side. Does the patient have any new or worsening symptoms? ---Yes Will a triage be completed? ---Yes Related visit to physician within the last 2 weeks? ---No Does the PT have any chronic conditions? (i.e. diabetes, asthma, this includes High risk factors for pregnancy, etc.) ---Yes List chronic conditions. ---htn Is this a behavioral health or substance abuse call? ---No Guidelines Guideline Title Affirmed Question Affirmed Notes Nurse Date/Time (Eastern Time) Abdominal Pain - Female [1] MILDMODERATE pain AND [2] constant AND [3] present > 2 hours Cantrell, RN, Benjamine Mola 04/04/2022 8:30:05 AM Disp. Time Eilene Ghazi Time) Disposition Final User 04/04/2022 8:05:45 AM Attempt made - line busy Wiggins, RN, Benjamine Mola 04/04/2022 8:17:30 AM Attempt made - no  message left Cantrell, RN, Benjamine Mola 04/04/2022 8:21:07 AM Send To Call Back Waiting For Nurse Dara Hoyer PLEASE NOTE: All timestamps contained within this report are represented as [place] Standard Time. CONFIDENTIALTY NOTICE: This fax transmission is intended only for the addressee. It contains information that is legally privileged, confidential or otherwise protected from use or disclosure. If you are not the intended recipient, you are strictly prohibited from reviewing, disclosing, copying using or disseminating any of this information or taking any action in reliance on or regarding this information. If you have received this fax in error, please notify us immediately by telephone so that we can arrange for its return to [place]. Phone: [phone], Toll-Free: [phone], Fax: [phone] Page: 2 of 2 Call Id: [id number] [place]. Time Eilene Ghazi Time) Disposition Final User 04/04/2022 8:33:29 AM See HCP within 4 Hours (or PCP triage) Yes Cantrell, RN, Benjamine Mola Final Disposition 04/04/2022 8:33:29 AM See HCP within 4 Hours (or PCP triage) Yes Cantrell, RN, Lorin Glass Disagree/Comply Comply Caller Understands Yes PreDisposition Go to Urgent Care/Walk-In Clinic Care Advice Given Per Guideline SEE HCP (OR PCP TRIAGE) WITHIN 4 HOURS: * IF OFFICE WILL BE CLOSED AND NO PCP (PRIMARY CARE PROVIDER) SECOND-LEVEL TRIAGE: You need to be seen within the next 3 or 4 hours. A nearby Urgent Care Center [facility]) is often a good source of care. Another choice is to go to the ED. Go sooner if you become worse. CALL BACK IF: * You become worse CARE ADVICE given per Abdominal Pain - Female (Adult) guideline. NOTHING BY MOUTH: * Do not eat or drink anything for now. Comments User: Sharol Given, RN Date/Time Eilene Ghazi Time): 04/04/2022 8:17:14 AM On first attempt received a message that said call was intercepted by no mo robo and then when called back a  busy signal. On second attempt, received  same message. Referrals GO TO FACILITY UNDECIDE

## 2022-04-11 ENCOUNTER — Telehealth: Payer: Self-pay

## 2022-04-11 ENCOUNTER — Encounter: Payer: Self-pay | Admitting: Family Medicine

## 2022-04-11 ENCOUNTER — Ambulatory Visit (INDEPENDENT_AMBULATORY_CARE_PROVIDER_SITE_OTHER): Payer: Medicare Other | Admitting: Family Medicine

## 2022-04-11 VITALS — BP 130/70 | HR 62 | Temp 98.4°F | Ht 64.5 in | Wt 218.5 lb

## 2022-04-11 DIAGNOSIS — K529 Noninfective gastroenteritis and colitis, unspecified: Secondary | ICD-10-CM | POA: Insufficient documentation

## 2022-04-11 NOTE — Progress Notes (Addendum)
Chronic Care Management Pharmacy Assistant   Name: Naysha Sholl  MRN: [id number] DOB: 1941-04-24  Reason for Encounter: Non-CCM [facility] Follow-Up)  Medications: Outpatient Encounter Medications as of 04/11/2022  Medication Sig   amoxicillin-clavulanate (AUGMENTIN) 875-125 MG tablet Take 1 tablet by mouth every 12 (twelve) hours.   Aspirin (ASPIR-81 PO) Take 1 tablet by mouth.   b complex vitamins tablet Take 1 tablet by mouth daily.   betamethasone dipropionate 0.05 % cream Apply topically 2 (two) times daily.   Calcium Citrate-Vitamin D (CALCIUM CITRATE + PO) Take 1 tablet by mouth daily.   Cholecalciferol (VITAMIN D3) 2000 UNITS TABS Take 1 capsule by mouth 2 (two) times daily.   clindamycin (CLEOCIN) 150 MG capsule Take 150 mg by mouth as needed. Takes 4 tablets before a dental procedure   Coenzyme Q10 (COQ10) 100 MG CAPS Take 1 capsule by mouth daily.   Misc Natural Products (GLUCOSAMINE CHOND MSM FORMULA PO) Take 1,500 mg by mouth.   Multiple Vitamin (MULTI VITAMIN DAILY PO) Take 1 tablet by mouth.   Omega-3 Fatty Acids (FISH OIL) 1200 MG CAPS Take 1 capsule by mouth.   rosuvastatin (CRESTOR) 10 MG tablet Take 10 mg by mouth every Monday, Wednesday, and Friday.   triamterene-hydrochlorothiazide (MAXZIDE-25) 37.5-25 MG tablet TAKE 1/2 TABLET BY MOUTH EVERY DAY   No facility-administered encounter medications on file as of 04/11/2022.     Reviewed hospital notes for details of recent visit. Has patient been contacted by Transitions of Care team? No Has patient seen PCP/specialist for hospital follow up (summarize OV if yes): No  Admitted to the ED on 04/04/2022. Discharge date was 04/04/2022.  Discharged from [facility].   Discharge diagnosis (Principal Problem): Colitis Patient was discharged to Home  Brief summary of hospital course: Patient presents with left lower abdominal pain.  She is 81 years of age.  With age and 2-1/2 to 3 days of  abdominal pain, increased risk for possible surgical etiology such as bowel obstruction, diverticulitis, kidney stone, vascular emergency.  At this time I have lower suspicion for vascular emergency or significant obstruction with mild to moderate pain and no associated symptoms.  We will proceed with CT to further define source of pain.  Patient does not need any pain medications or other interventional treatment at this time.   CT scan reviewed by radiology shows area of mild colitis possible epiploic appendagitis with proximal sigmoid inflammatory changes.   Patient is clinically well in appearance.  With persistent pain and inflammatory changes will opt to initiate antibiotic therapy.  Patient has antibiotic allergies to ciprofloxacin, Levaquin, penicillins and cefixime.  Siltape allergy appears mild with rash.  Will opt to initiate Augmentin with careful return precautions and discontinuation if any symptoms develop.  Patient is advised to get close follow-up with PCP for monitoring of resolution as well as possible repeat colonoscopy if indicated when symptoms resolved.  New?Medications Started at [facility] Discharge:?? -Started amoxicillin-clavulanate (AUGMENTIN) 875-125 MG tablet    Medication Changes at Hospital Discharge: None noted  Medications Discontinued at Hospital Discharge: None noted  Medications that remain the same after Hospital Discharge:??  -All other medications will remain the same.    Next CCM appt: Non-CCM  Other upcoming appts: PCP appointment on 04/11/2022  Charlene Brooke, PharmD notified and will determine if action is needed.  Marijean Niemann, [facility] Pharmacy Assistant [phone]   Pharmacist addendum: Pt has PCP appt scheduled. No further action needed.  Charlene Brooke, PharmD, Para March  04/11/22 10:44 AM

## 2022-04-11 NOTE — Progress Notes (Signed)
Patient ID: Darlene Lawrence, female    DOB: 03/16/1941, 81 y.o.   MRN: [id number]  This visit was conducted in person.  BP 130/70   Pulse 62   Temp 98.4 F (36.9 C) (Oral)   Ht 5' 4.5" (1.638 m)   Wt 218 lb 8 oz (99.1 kg)   LMP  (LMP Unknown)   SpO2 95%   BMI 36.93 kg/m    CC:  Subjective:   HPI: Darlene Lawrence is a 81 y.o. female presenting on 04/11/2022 for Follow-up (ER visit-Colitis 04/04/22)   ER course visit on April 04, 2022 Started with left lower quadrant pain.   CT scan reviewed showed area of mild colitis possible epiploic appendagitis with proximal sigmoid inflammatory changes. Treated with Augmentin.   Today she reports she tolerated the Augmentin.. complete the course today.   Pain in abdomen resolved.  No fever.  No dysuria. No D/C.  She is back to baseline.    Relevant past medical, surgical, family and social history reviewed and updated as indicated. Interim medical history since our last visit reviewed. Allergies and medications reviewed and updated. Outpatient Medications Prior to Visit  Medication Sig Dispense Refill   amoxicillin-clavulanate (AUGMENTIN) 875-125 MG tablet Take 1 tablet by mouth every 12 (twelve) hours. 14 tablet 0   Aspirin (ASPIR-81 PO) Take 1 tablet by mouth.     b complex vitamins tablet Take 1 tablet by mouth daily.     Calcium Citrate-Vitamin D (CALCIUM CITRATE + PO) Take 1 tablet by mouth daily.     Cholecalciferol (VITAMIN D3) 2000 UNITS TABS Take 1 capsule by mouth 2 (two) times daily.     clindamycin (CLEOCIN) 150 MG capsule Take 150 mg by mouth as needed. Takes 4 tablets before a dental procedure     Coenzyme Q10 (COQ10) 100 MG CAPS Take 1 capsule by mouth daily.     Misc Natural Products (GLUCOSAMINE CHOND MSM FORMULA PO) Take 1,500 mg by mouth.     Multiple Vitamin (MULTI VITAMIN DAILY PO) Take 1 tablet by mouth.     Omega-3 Fatty Acids (FISH OIL) 1200 MG CAPS Take 1 capsule by mouth.     rosuvastatin  (CRESTOR) 10 MG tablet Take 10 mg by mouth every Monday, Wednesday, and Friday.     triamterene-hydrochlorothiazide (MAXZIDE-25) 37.5-25 MG tablet TAKE 1/2 TABLET BY MOUTH EVERY DAY 45 tablet 3   betamethasone dipropionate 0.05 % cream Apply topically 2 (two) times daily. 30 g 0   No facility-administered medications prior to visit.     Per HPI unless specifically indicated in ROS section below Review of Systems  Constitutional:  Negative for fatigue and fever.  HENT:  Negative for congestion.   Eyes:  Negative for pain.  Respiratory:  Negative for cough and shortness of breath.   Cardiovascular:  Negative for chest pain, palpitations and leg swelling.  Gastrointestinal:  Negative for abdominal pain.  Genitourinary:  Negative for dysuria and vaginal bleeding.  Musculoskeletal:  Negative for back pain.  Neurological:  Negative for syncope, light-headedness and headaches.  Psychiatric/Behavioral:  Negative for dysphoric mood.    Objective:  BP 130/70   Pulse 62   Temp 98.4 F (36.9 C) (Oral)   Ht 5' 4.5" (1.638 m)   Wt 218 lb 8 oz (99.1 kg)   LMP  (LMP Unknown)   SpO2 95%   BMI 36.93 kg/m   Wt Readings from Last 3 Encounters:  04/11/22 218 lb 8 oz (99.1 kg)  04/04/22 215 lb (97.5 kg)  01/14/22 224 lb (101.6 kg)      Physical Exam Constitutional:      General: She is not in acute distress.    Appearance: Normal appearance. She is well-developed. She is not ill-appearing or toxic-appearing.  HENT:     Head: Normocephalic.     Right Ear: Hearing, tympanic membrane, ear canal and external ear normal. Tympanic membrane is not erythematous, retracted or bulging.     Left Ear: Hearing, tympanic membrane, ear canal and external ear normal. Tympanic membrane is not erythematous, retracted or bulging.     Nose: No mucosal edema or rhinorrhea.     Right Sinus: No maxillary sinus tenderness or frontal sinus tenderness.     Left Sinus: No maxillary sinus tenderness or frontal sinus  tenderness.     Mouth/Throat:     Pharynx: Uvula midline.  Eyes:     General: Lids are normal. Lids are everted, no foreign bodies appreciated.     Conjunctiva/sclera: Conjunctivae normal.     Pupils: Pupils are equal, round, and reactive to light.  Neck:     Thyroid: No thyroid mass or thyromegaly.     Vascular: No carotid bruit.     Trachea: Trachea normal.  Cardiovascular:     Rate and Rhythm: Normal rate and regular rhythm.     Pulses: Normal pulses.     Heart sounds: Normal heart sounds, S1 normal and S2 normal. No murmur heard.    No friction rub. No gallop.  Pulmonary:     Effort: Pulmonary effort is normal. No tachypnea or respiratory distress.     Breath sounds: Normal breath sounds. No decreased breath sounds, wheezing, rhonchi or rales.  Abdominal:     General: Bowel sounds are normal.     Palpations: Abdomen is soft.     Tenderness: There is no abdominal tenderness.  Musculoskeletal:     Cervical back: Normal range of motion and neck supple.  Skin:    General: Skin is warm and dry.     Findings: No rash.  Neurological:     Mental Status: She is alert.  Psychiatric:        Mood and Affect: Mood is not anxious or depressed.        Speech: Speech normal.        Behavior: Behavior normal. Behavior is cooperative.        Thought Content: Thought content normal.        Judgment: Judgment normal.       Results for orders placed or performed during the hospital encounter of 04/04/22  CBC with Differential  Result Value Ref Range   WBC 3.9 (L) 4.0 - 10.5 K/uL   RBC 4.07 3.87 - 5.11 MIL/uL   Hemoglobin 13.7 12.0 - 15.0 g/dL   HCT 41.5 36.0 - 46.0 %   MCV 102.0 (H) 80.0 - 100.0 fL   MCH 33.7 26.0 - 34.0 pg   MCHC 33.0 30.0 - 36.0 g/dL   RDW 13.9 11.5 - 15.5 %   Platelets 125 (L) 150 - 400 K/uL   nRBC 0.0 0.0 - 0.2 %   Neutrophils Relative % 43 %   Neutro Abs 1.7 1.7 - 7.7 K/uL   Lymphocytes Relative 45 %   Lymphs Abs 1.7 0.7 - 4.0 K/uL   Monocytes Relative 8 %    Monocytes Absolute 0.3 0.1 - 1.0 K/uL   Eosinophils Relative 3 %   Eosinophils Absolute 0.1 0.0 -  0.5 K/uL   Basophils Relative 1 %   Basophils Absolute 0.0 0.0 - 0.1 K/uL   Immature Granulocytes 0 %   Abs Immature Granulocytes 0.00 0.00 - 0.07 K/uL  Comprehensive metabolic panel  Result Value Ref Range   Sodium 139 135 - 145 mmol/L   Potassium 4.1 3.5 - 5.1 mmol/L   Chloride 102 98 - 111 mmol/L   CO2 26 22 - 32 mmol/L   Glucose, Bld 108 (H) 70 - 99 mg/dL   BUN 23 8 - 23 mg/dL   Creatinine, Ser 1.26 (H) 0.44 - 1.00 mg/dL   Calcium 9.4 8.9 - 10.3 mg/dL   Total Protein 8.1 6.5 - 8.1 g/dL   Albumin 4.4 3.5 - 5.0 g/dL   AST 22 15 - 41 U/L   ALT 28 0 - 44 U/L   Alkaline Phosphatase 57 38 - 126 U/L   Total Bilirubin 0.6 0.3 - 1.2 mg/dL   GFR, Estimated 43 (L) >60 mL/min   Anion gap 11 5 - 15  Lipase, blood  Result Value Ref Range   Lipase 26 11 - 51 U/L  Urinalysis, Routine w reflex microscopic Urine, Clean Catch  Result Value Ref Range   Color, Urine YELLOW YELLOW   APPearance HAZY (A) CLEAR   Specific Gravity, Urine 1.013 1.005 - 1.030   pH 5.0 5.0 - 8.0   Glucose, UA NEGATIVE NEGATIVE mg/dL   Hgb urine dipstick TRACE (A) NEGATIVE   Bilirubin Urine NEGATIVE NEGATIVE   Ketones, ur NEGATIVE NEGATIVE mg/dL   Protein, ur NEGATIVE NEGATIVE mg/dL   Nitrite NEGATIVE NEGATIVE   Leukocytes,Ua LARGE (A) NEGATIVE   RBC / HPF 0-5 0 - 5 RBC/hpf   WBC, UA 0-5 0 - 5 WBC/hpf   Bacteria, UA FEW (A) NONE SEEN   Squamous Epithelial / LPF 6-10 0 - 5   Mucus PRESENT      COVID 19 screen:  No recent travel or known exposure to COVID19 The patient denies respiratory symptoms of COVID 19 at this time. The importance of social distancing was discussed today.   Assessment and Plan Problem List Items Addressed This Visit     Colitis - Primary    Acute, significant improvement with Augmentin.  Encouraged her to use probiotic and to keep up with hydration.  She is back to baseline.           Eliezer Lofts, MD

## 2022-04-11 NOTE — Assessment & Plan Note (Signed)
Acute, significant improvement with Augmentin.  Encouraged her to use probiotic and to keep up with hydration.  She is back to baseline.

## 2022-05-13 DIAGNOSIS — M9902 Segmental and somatic dysfunction of thoracic region: Secondary | ICD-10-CM | POA: Diagnosis not present

## 2022-05-13 DIAGNOSIS — M9903 Segmental and somatic dysfunction of lumbar region: Secondary | ICD-10-CM | POA: Diagnosis not present

## 2022-05-13 DIAGNOSIS — M62830 Muscle spasm of back: Secondary | ICD-10-CM | POA: Diagnosis not present

## 2022-05-13 DIAGNOSIS — M5136 Other intervertebral disc degeneration, lumbar region: Secondary | ICD-10-CM | POA: Diagnosis not present

## 2022-06-10 DIAGNOSIS — M9903 Segmental and somatic dysfunction of lumbar region: Secondary | ICD-10-CM | POA: Diagnosis not present

## 2022-06-10 DIAGNOSIS — M9902 Segmental and somatic dysfunction of thoracic region: Secondary | ICD-10-CM | POA: Diagnosis not present

## 2022-06-10 DIAGNOSIS — M62830 Muscle spasm of back: Secondary | ICD-10-CM | POA: Diagnosis not present

## 2022-06-10 DIAGNOSIS — M5136 Other intervertebral disc degeneration, lumbar region: Secondary | ICD-10-CM | POA: Diagnosis not present

## 2022-07-08 DIAGNOSIS — M5136 Other intervertebral disc degeneration, lumbar region: Secondary | ICD-10-CM | POA: Diagnosis not present

## 2022-07-08 DIAGNOSIS — M62830 Muscle spasm of back: Secondary | ICD-10-CM | POA: Diagnosis not present

## 2022-07-08 DIAGNOSIS — M9903 Segmental and somatic dysfunction of lumbar region: Secondary | ICD-10-CM | POA: Diagnosis not present

## 2022-07-08 DIAGNOSIS — M9902 Segmental and somatic dysfunction of thoracic region: Secondary | ICD-10-CM | POA: Diagnosis not present

## 2022-07-10 DIAGNOSIS — H6982 Other specified disorders of Eustachian tube, left ear: Secondary | ICD-10-CM | POA: Diagnosis not present

## 2022-07-10 DIAGNOSIS — H903 Sensorineural hearing loss, bilateral: Secondary | ICD-10-CM | POA: Diagnosis not present

## 2022-08-05 DIAGNOSIS — M9903 Segmental and somatic dysfunction of lumbar region: Secondary | ICD-10-CM | POA: Diagnosis not present

## 2022-08-05 DIAGNOSIS — M62830 Muscle spasm of back: Secondary | ICD-10-CM | POA: Diagnosis not present

## 2022-08-05 DIAGNOSIS — M9902 Segmental and somatic dysfunction of thoracic region: Secondary | ICD-10-CM | POA: Diagnosis not present

## 2022-08-05 DIAGNOSIS — M5136 Other intervertebral disc degeneration, lumbar region: Secondary | ICD-10-CM | POA: Diagnosis not present

## 2022-09-02 DIAGNOSIS — M9903 Segmental and somatic dysfunction of lumbar region: Secondary | ICD-10-CM | POA: Diagnosis not present

## 2022-09-02 DIAGNOSIS — M5136 Other intervertebral disc degeneration, lumbar region: Secondary | ICD-10-CM | POA: Diagnosis not present

## 2022-09-02 DIAGNOSIS — M9902 Segmental and somatic dysfunction of thoracic region: Secondary | ICD-10-CM | POA: Diagnosis not present

## 2022-09-02 DIAGNOSIS — M62830 Muscle spasm of back: Secondary | ICD-10-CM | POA: Diagnosis not present

## 2022-09-30 DIAGNOSIS — M5136 Other intervertebral disc degeneration, lumbar region: Secondary | ICD-10-CM | POA: Diagnosis not present

## 2022-09-30 DIAGNOSIS — M62830 Muscle spasm of back: Secondary | ICD-10-CM | POA: Diagnosis not present

## 2022-09-30 DIAGNOSIS — M9903 Segmental and somatic dysfunction of lumbar region: Secondary | ICD-10-CM | POA: Diagnosis not present

## 2022-09-30 DIAGNOSIS — M9902 Segmental and somatic dysfunction of thoracic region: Secondary | ICD-10-CM | POA: Diagnosis not present

## 2022-10-28 DIAGNOSIS — M9902 Segmental and somatic dysfunction of thoracic region: Secondary | ICD-10-CM | POA: Diagnosis not present

## 2022-10-28 DIAGNOSIS — M62830 Muscle spasm of back: Secondary | ICD-10-CM | POA: Diagnosis not present

## 2022-10-28 DIAGNOSIS — M9903 Segmental and somatic dysfunction of lumbar region: Secondary | ICD-10-CM | POA: Diagnosis not present

## 2022-10-28 DIAGNOSIS — M5136 Other intervertebral disc degeneration, lumbar region: Secondary | ICD-10-CM | POA: Diagnosis not present

## 2022-10-31 DIAGNOSIS — H903 Sensorineural hearing loss, bilateral: Secondary | ICD-10-CM | POA: Diagnosis not present

## 2022-10-31 DIAGNOSIS — H6122 Impacted cerumen, left ear: Secondary | ICD-10-CM | POA: Diagnosis not present

## 2022-10-31 DIAGNOSIS — H6982 Other specified disorders of Eustachian tube, left ear: Secondary | ICD-10-CM | POA: Diagnosis not present

## 2022-11-25 DIAGNOSIS — M9902 Segmental and somatic dysfunction of thoracic region: Secondary | ICD-10-CM | POA: Diagnosis not present

## 2022-11-25 DIAGNOSIS — M9903 Segmental and somatic dysfunction of lumbar region: Secondary | ICD-10-CM | POA: Diagnosis not present

## 2022-11-25 DIAGNOSIS — M5136 Other intervertebral disc degeneration, lumbar region: Secondary | ICD-10-CM | POA: Diagnosis not present

## 2022-11-25 DIAGNOSIS — M62830 Muscle spasm of back: Secondary | ICD-10-CM | POA: Diagnosis not present

## 2022-12-23 DIAGNOSIS — M62830 Muscle spasm of back: Secondary | ICD-10-CM | POA: Diagnosis not present

## 2022-12-23 DIAGNOSIS — M5136 Other intervertebral disc degeneration, lumbar region: Secondary | ICD-10-CM | POA: Diagnosis not present

## 2022-12-23 DIAGNOSIS — M9902 Segmental and somatic dysfunction of thoracic region: Secondary | ICD-10-CM | POA: Diagnosis not present

## 2022-12-23 DIAGNOSIS — M9903 Segmental and somatic dysfunction of lumbar region: Secondary | ICD-10-CM | POA: Diagnosis not present

## 2022-12-25 ENCOUNTER — Telehealth: Payer: Self-pay | Admitting: *Deleted

## 2022-12-25 ENCOUNTER — Encounter (INDEPENDENT_AMBULATORY_CARE_PROVIDER_SITE_OTHER): Payer: Self-pay

## 2022-12-25 DIAGNOSIS — E782 Mixed hyperlipidemia: Secondary | ICD-10-CM

## 2022-12-25 NOTE — Telephone Encounter (Signed)
-----   Message from Alvina Chou sent at 12/25/2022 12:15 PM EDT ----- Regarding: Lab orders for Friday, 8.30.24 Patient is scheduled for CPX labs, please order future labs, Thanks , Camelia Eng

## 2023-01-09 ENCOUNTER — Other Ambulatory Visit (INDEPENDENT_AMBULATORY_CARE_PROVIDER_SITE_OTHER): Payer: Medicare Other

## 2023-01-09 DIAGNOSIS — E782 Mixed hyperlipidemia: Secondary | ICD-10-CM | POA: Diagnosis not present

## 2023-01-09 LAB — LIPID PANEL
Cholesterol: 130 mg/dL (ref 0–200)
HDL: 35.4 mg/dL — ABNORMAL LOW (ref >39.00)
LDL Cholesterol: 73 mg/dL (ref 0–99)
NonHDL: 94.49
Total CHOL/HDL Ratio: 4
Triglycerides: 107 mg/dL (ref 0.0–149.0)
VLDL: 21.4 mg/dL (ref 0.0–40.0)

## 2023-01-09 LAB — COMPREHENSIVE METABOLIC PANEL
ALT: 16 U/L (ref 0–35)
AST: 17 U/L (ref 0–37)
Albumin: 3.6 g/dL (ref 3.5–5.2)
Alkaline Phosphatase: 57 U/L (ref 39–117)
BUN: 28 mg/dL — ABNORMAL HIGH (ref 6–23)
CO2: 31 mEq/L (ref 19–32)
Calcium: 9.4 mg/dL (ref 8.4–10.5)
Chloride: 102 mEq/L (ref 96–112)
Creatinine, Ser: 1.2 mg/dL (ref 0.40–1.20)
GFR: 42.83 mL/min — ABNORMAL LOW (ref >60.00)
Glucose, Bld: 92 mg/dL (ref 70–99)
Potassium: 4.1 mEq/L (ref 3.5–5.1)
Sodium: 140 mEq/L (ref 135–145)
Total Bilirubin: 0.5 mg/dL (ref 0.2–1.2)
Total Protein: 7 g/dL (ref 6.0–8.3)

## 2023-01-09 NOTE — Progress Notes (Signed)
No critical labs need to be addressed urgently. We will discuss labs in detail at upcoming office visit.   

## 2023-01-15 ENCOUNTER — Ambulatory Visit (INDEPENDENT_AMBULATORY_CARE_PROVIDER_SITE_OTHER): Payer: Medicare Other

## 2023-01-15 VITALS — Ht 64.0 in | Wt 205.0 lb

## 2023-01-15 DIAGNOSIS — Z1231 Encounter for screening mammogram for malignant neoplasm of breast: Secondary | ICD-10-CM

## 2023-01-15 DIAGNOSIS — Z0000 Encounter for general adult medical examination without abnormal findings: Secondary | ICD-10-CM | POA: Diagnosis not present

## 2023-01-15 DIAGNOSIS — Z780 Asymptomatic menopausal state: Secondary | ICD-10-CM

## 2023-01-15 NOTE — Patient Instructions (Addendum)
Darlene Lawrence , Thank you for taking time to come for your Medicare Wellness Visit. I appreciate your ongoing commitment to your health goals. Please review the following plan we discussed and let me know if I can assist you in the future.   Referrals/Orders/Follow-Ups/Clinician Recommendations: Aim for 30 minutes of exercise or brisk walking, 6-8 glasses of water, and 5 servings of fruits and vegetables each day.   You have an order for:  []   2D Mammogram  [x]   3D Mammogram  [x]   Bone Density     Please call for appointment:  [facility] [address] [place], [place] [address] [phone]  [facility] [address] [place], [place] [address] [phone]  [facility] [address] [place], [place] [address] [phone]  [facility] [address] [place], [place] [address] [phone]  [facility] [address] [place], [place] [address] [phone]    Make sure to wear two-piece clothing.  No lotions, powders, or deodorants the day of the appointment. Make sure to bring picture ID and insurance card.  Bring list of medications you are currently taking including any supplements.   Schedule your [facility] screening mammogram through MyChart!   Log into your MyChart account.  Go to 'Visit' (or 'Appointments' if on mobile App) --> Schedule an Appointment  Under 'Select a Reason for Visit' choose the Mammogram Screening option.  Complete the pre-visit questions and select the time and place that best fits your schedule.    This is a list of the screening recommended for you and due dates:  Health Maintenance  Topic Date Due   Zoster (Shingles) Vaccine (1 of 2) 10/08/1992   DTaP/Tdap/Td vaccine (3 - Td or Tdap) 11/10/2021   Mammogram  11/16/2022   Flu Shot  12/11/2022   Medicare Annual Wellness Visit  12/21/2022    COVID-19 Vaccine (3 - 2023-24 season) 01/11/2023   Pneumonia Vaccine  Completed   DEXA scan (bone density measurement)  Completed   HPV Vaccine  Aged Out   Colon Cancer Screening  Discontinued   Hepatitis C Screening  Discontinued    Advanced directives: (Copy Requested) Please bring a copy of your health care power of attorney and living will to the office to be added to your chart at your convenience.  Next Medicare Annual Wellness Visit scheduled for next year: Yes

## 2023-01-15 NOTE — Progress Notes (Signed)
Subjective:   Darlene Lawrence is a 82 y.o. female who presents for Medicare Annual (Subsequent) preventive examination.  Visit Complete: Virtual  I connected with  Darlene Lawrence on 01/15/23 by a audio enabled telemedicine application and verified that I am speaking with the correct person using two identifiers.  Patient Location: Home  Provider Location: Office/Clinic  I discussed the limitations of evaluation and management by telemedicine. The patient expressed understanding and agreed to proceed.  Vital Signs: Because this visit was a virtual/telehealth visit, some criteria may be missing or patient reported. Any vitals not documented were not able to be obtained and vitals that have been documented are patient reported.    Review of Systems      Cardiac Risk Factors include: advanced age (>100men, >44 women);hypertension;sedentary lifestyle;dyslipidemia     Objective:    Today's Vitals   01/15/23 0948  Weight: 205 lb (93 kg)  Height: 5\' 4"  (1.626 m)   Body mass index is 35.19 kg/m.     01/15/2023    9:53 AM 04/04/2022   10:11 AM 12/20/2021    8:14 AM 12/10/2020    9:00 AM 11/30/2019    9:46 AM 11/06/2017   10:16 AM 11/06/2016    9:51 AM  Advanced Directives  Does Patient Have a Medical Advance Directive? Yes No Yes Yes Yes Yes Yes  Type of Estate agent of [place];Living will  Healthcare Power of [place];Living will Healthcare Power of [place];Living will Healthcare Power of [place];Living will Healthcare Power of [place];Living will Healthcare Power of [place];Living will  Copy of Healthcare Power of Attorney in Chart? No - copy requested  No - copy requested No - copy requested No - copy requested Yes     Current Medications (verified) Outpatient Encounter Medications as of 01/15/2023  Medication Sig   Aspirin (ASPIR-81 PO) Take 1 tablet by mouth.   b complex vitamins tablet Take 1 tablet by mouth daily.   Calcium Citrate-Vitamin  D (CALCIUM CITRATE + PO) Take 1 tablet by mouth daily.   Cholecalciferol (VITAMIN D3) 2000 UNITS TABS Take 1 capsule by mouth 2 (two) times daily.   Coenzyme Q10 (COQ10) 100 MG CAPS Take 1 capsule by mouth daily.   fluticasone (FLONASE) 50 MCG/ACT nasal spray Place into both nostrils daily.   Misc Natural Products (GLUCOSAMINE CHOND MSM FORMULA PO) Take 1,500 mg by mouth.   Multiple Vitamin (MULTI VITAMIN DAILY PO) Take 1 tablet by mouth.   Omega-3 Fatty Acids (FISH OIL) 1200 MG CAPS Take 1 capsule by mouth.   rosuvastatin (CRESTOR) 10 MG tablet Take 10 mg by mouth every Monday, Wednesday, and Friday.   triamterene-hydrochlorothiazide (MAXZIDE-25) 37.5-25 MG tablet TAKE 1/2 TABLET BY MOUTH EVERY DAY   amoxicillin-clavulanate (AUGMENTIN) 875-125 MG tablet Take 1 tablet by mouth every 12 (twelve) hours.   clindamycin (CLEOCIN) 150 MG capsule Take 150 mg by mouth as needed. Takes 4 tablets before a dental procedure (Patient not taking: Reported on 01/15/2023)   No facility-administered encounter medications on file as of 01/15/2023.    Allergies (verified) Cefixime, Ciprofloxacin, Levaquin [levofloxacin], and Penicillins   History: Past Medical History:  Diagnosis Date   Abnormal Pap smear    Arthritis    breast cancer 2009   right   Cancer [facility])    CHF (congestive heart failure) (HCC)    DDD (degenerative disc disease), lumbar    High cholesterol    Hyperlipidemia    Hypertension    Meniscus tear  right   Personal history of radiation therapy    RBBB    Past Surgical History:  Procedure Laterality Date   BREAST BIOPSY Right 08/18/2007   BREAST LUMPECTOMY Right 2009   BREAST SURGERY Right 2009   lumpectomy, no radiation, no chemo   DILATION AND CURETTAGE OF UTERUS  2012   Dr. Stanford Breed   HERNIA REPAIR     umbilical   JOINT REPLACEMENT Left    hip   LEFT HEART CATH AND CORONARY ANGIOGRAPHY N/A 09/17/2016   Procedure: Left Heart Cath and Coronary Angiography;  Surgeon:  Dalia Heading, MD;  Location: [facility] INVASIVE CV LAB;  Service: Cardiovascular;  Laterality: N/A;   TOTAL HIP ARTHROPLASTY Left    TOTAL KNEE ARTHROPLASTY Right 06/06/2013   Procedure: RIGHT TOTAL KNEE ARTHROPLASTY;  Surgeon: Loanne Drilling, MD;  Location: [facility] OR[facility];  Service: Orthopedics;  Laterality: Right;   TUBAL LIGATION     Family History  Problem Relation Age of Onset   Cancer Mother        breast and colon   Breast cancer Mother    Hypertension Father    Heart disease Father    Thyroid disease Father    Down syndrome Son    Asthma Other    Social History   Socioeconomic History   Marital status: Married    Spouse name: Not on file   Number of children: 2   Years of education: Not on file   Highest education level: Not on file  Occupational History   Occupation: advertising for store [place]    Employer: RETIRED  Tobacco Use   Smoking status: Never   Smokeless tobacco: Never  Vaping Use   Vaping status: Never Used  Substance and Sexual Activity   Alcohol use: Yes    Comment: once a month   Drug use: No   Sexual activity: Yes    Partners: Male    Birth control/protection: Post-menopausal  Other Topics Concern   Not on file  Social History Narrative   Regular exercise--yes, curves 3 times a week   Diet-- fruits and veggies, water, no fast food       Social Determinants of Health   Financial Resource Strain: Low Risk  (01/15/2023)   Overall Financial Resource Strain (CARDIA)    Difficulty of Paying Living Expenses: Not hard at all  Food Insecurity: No Food Insecurity (01/15/2023)   Hunger Vital Sign    Worried About Running Out of Food in the Last Year: Never true    Ran Out of Food in the Last Year: Never true  Transportation Needs: No Transportation Needs (01/15/2023)   PRAPARE - Administrator, Civil Service (Medical): No    Lack of Transportation (Non-Medical): No  Physical Activity: Inactive (01/15/2023)   Exercise Vital Sign    Days of  Exercise per Week: 0 days    Minutes of Exercise per Session: 0 min  Stress: No Stress Concern Present (01/15/2023)   [employer] of Occupational Health - Occupational Stress Questionnaire    Feeling of Stress : Not at all  Social Connections: Socially Integrated (01/15/2023)   Social Connection and Isolation Panel [NHANES]    Frequency of Communication with Friends and Family: More than three times a week    Frequency of Social Gatherings with Friends and Family: More than three times a week    Attends Religious Services: More than 4 times per year    Active Member of  Clubs or Organizations: Yes    Attends Engineer, structural: More than 4 times per year    Marital Status: Married    Tobacco Counseling Counseling given: Not Answered   Clinical Intake:  Pre-visit preparation completed: Yes  Pain : No/denies pain     BMI - recorded: 35.19 Nutritional Status: BMI > 30  Obese Nutritional Risks: None Diabetes: No  How often do you need to have someone help you when you read instructions, pamphlets, or other written materials from your doctor or pharmacy?: (P) 1 - Never  Interpreter Needed?: No  Information entered by :: C.Johana Hopkinson LPN   Activities of Daily Living    01/15/2023   10:03 AM 01/15/2023    9:55 AM  In your present state of health, do you have any difficulty performing the following activities:  Hearing? 0 1  Comment  wears aids  Vision? 0 0  Difficulty concentrating or making decisions?  0  Walking or climbing stairs? 0 0  Dressing or bathing? 0 0  Doing errands, shopping? 0 0  Preparing Food and eating ? N N  Using the Toilet? N N  In the past six months, have you accidently leaked urine? Y Y  Comment  occasional, wears pad  Do you have problems with loss of bowel control? Y N  Managing your Medications? N N  Managing your Finances? N N  Housekeeping or managing your Housekeeping? N N    Patient Care Team: Excell Seltzer, MD as PCP -  General Serena Croissant, MD as Consulting Physician (Hematology and Oncology) Ollen Gross, MD as Consulting Physician (Orthopedic Surgery) Allie Bossier, MD as Consulting Physician (Obstetrics and Gynecology) Johnsie Kindred, DO as Referring Physician (Optometry) Delma Freeze, FNP as Nurse Practitioner (Family Medicine) Lady Gary Darlin Priestly, MD as Consulting Physician (Cardiology)  Indicate any recent Medical Services you may have received from other than [facility] providers in the past year (date may be approximate).     Assessment:   This is a routine wellness examination for [place].  Hearing/Vision screen Hearing Screening - Comments:: Bilateral hearing aids  Vision Screening - Comments:: Glasses - UTD on eye exams, has appointment at [facility] next month.   Goals Addressed             This Visit's Progress    Patient Stated       Lose weight       Depression Screen    01/15/2023    9:49 AM 12/20/2021    8:15 AM 12/10/2020    9:01 AM 11/30/2019    9:46 AM 11/30/2018   10:22 AM 11/06/2017   10:03 AM 12/25/2016    8:52 AM  PHQ 2/9 Scores  PHQ - 2 Score 0 0 0 0 0 0 0  PHQ- 9 Score   0 0  0 2    Fall Risk    01/15/2023   10:03 AM 01/15/2023    9:54 AM 12/20/2021    8:15 AM 12/10/2020    9:01 AM 11/30/2019    9:46 AM  Fall Risk   Falls in the past year? 0 0 0 0 0  Number falls in past yr:  0 0 0 0  Injury with Fall?  0 0 0 0  Risk for fall due to :  No Fall Risks Medication side effect Medication side effect Medication side effect  Follow up  Falls prevention discussed;Falls evaluation completed Falls evaluation completed;Education provided;Falls prevention discussed Falls evaluation  completed;Falls prevention discussed Falls evaluation completed;Falls prevention discussed    MEDICARE RISK AT HOME: Medicare Risk at Home Any stairs in or around the home?: (P) Yes If so, are there any without handrails?: (P) No Home free of loose throw rugs in walkways, pet beds, electrical  cords, etc?: (P) Yes Adequate lighting in your home to reduce risk of falls?: (P) Yes Life alert?: (P) No Use of a cane, walker or w/c?: (P) No Grab bars in the bathroom?: (P) No Shower chair or bench in shower?: (P) No Elevated toilet seat or a handicapped toilet?: (P) No  TIMED UP AND GO:  Was the test performed?  No    Cognitive Function:    12/10/2020    9:03 AM 11/30/2019    9:51 AM 11/06/2017   10:05 AM 11/03/2016   10:20 AM 10/11/2015    9:40 AM  MMSE - Mini Mental State Exam  Not completed: Refused      Orientation to time  5 5 5 5   Orientation to Place  5 5 5 5   Registration  3 3 3 3   Attention/ Calculation  5 0 0 0  Recall  3 3 3 3   Language- name 2 objects   0 0 0  Language- repeat  1 1 1 1   Language- follow 3 step command   3 3 3   Language- read & follow direction   0 0 0  Write a sentence   0 0 0  Copy design   0 0 0  Total score   20 20 20         01/15/2023    9:56 AM 12/20/2021    8:16 AM  6CIT Screen  What Year? 0 points 0 points  What month? 0 points 0 points  What time? 0 points 0 points  Count back from 20 0 points 0 points  Months in reverse 0 points 0 points  Repeat phrase 8 points 0 points  Total Score 8 points 0 points    Immunizations Immunization History  Administered Date(s) Administered   Fluad Quad(high Dose 65+) 02/03/2022   Influenza Split 02/19/2011, 02/23/2012   Influenza Whole 02/10/2007, 02/15/2008, 03/06/2010   Influenza,inj,Quad PF,6+ Mos 02/20/2014, 03/10/2017   Influenza-Unspecified 02/05/2016, 05/12/2018   PFIZER(Purple Top)SARS-COV-2 Vaccination 06/03/2019, 06/24/2019   Pneumococcal Conjugate-13 07/04/2014   Pneumococcal Polysaccharide-23 07/11/2008   Td 09/09/2000   Tdap 11/11/2011   Zoster, Live 11/14/2009    TDAP status: Due, Education has been provided regarding the importance of this vaccine. Advised may receive this vaccine at local pharmacy or Health Dept. Aware to provide a copy of the vaccination record if  obtained from local pharmacy or Health Dept. Verbalized acceptance and understanding.  Flu Vaccine status: Due, Education has been provided regarding the importance of this vaccine. Advised may receive this vaccine at local pharmacy or Health Dept. Aware to provide a copy of the vaccination record if obtained from local pharmacy or Health Dept. Verbalized acceptance and understanding.  Pneumococcal vaccine status: Up to date  Covid-19 vaccine status: Information provided on how to obtain vaccines.   Qualifies for Shingles Vaccine? Yes   Zostavax completed Yes   Shingrix Completed?: No.    Education has been provided regarding the importance of this vaccine. Patient has been advised to call insurance company to determine out of pocket expense if they have not yet received this vaccine. Advised may also receive vaccine at local pharmacy or Health Dept. Verbalized acceptance and understanding.  Screening Tests  Health Maintenance  Topic Date Due   Zoster Vaccines- Shingrix (1 of 2) 10/08/1992   DTaP/Tdap/Td (3 - Td or Tdap) 11/10/2021   MAMMOGRAM  11/16/2022   INFLUENZA VACCINE  12/11/2022   COVID-19 Vaccine (3 - 2023-24 season) 01/11/2023   Medicare Annual Wellness (AWV)  01/15/2024   Pneumonia Vaccine 21+ Years old  Completed   DEXA SCAN  Completed   HPV VACCINES  Aged Out   Colonoscopy  Discontinued   Hepatitis C Screening  Discontinued    Health Maintenance  Health Maintenance Due  Topic Date Due   Zoster Vaccines- Shingrix (1 of 2) 10/08/1992   DTaP/Tdap/Td (3 - Td or Tdap) 11/10/2021   MAMMOGRAM  11/16/2022   INFLUENZA VACCINE  12/11/2022   COVID-19 Vaccine (3 - 2023-24 season) 01/11/2023    Colorectal cancer screening: No longer required.   Mammogram status: Ordered 01/15/23. Pt provided with contact info and advised to call to schedule appt.   Bone Density status: Ordered 01/15/23. Pt provided with contact info and advised to call to schedule appt.  Lung Cancer  Screening: (Low Dose CT Chest recommended if Age 82-80 years, 20 pack-year currently smoking OR have quit w/in 15years.) does not qualify.   Lung Cancer Screening Referral:    Additional Screening:  Hepatitis C Screening: does not qualify; Completed 12/06/19  Vision Screening: Recommended annual ophthalmology exams for early detection of glaucoma and other disorders of the eye. Is the patient up to date with their annual eye exam?  Yes  Who is the provider or what is the name of the office in which the patient attends annual eye exams? [facility] If pt is not established with a provider, would they like to be referred to a provider to establish care? Yes .   Dental Screening: Recommended annual dental exams for proper oral hygiene  Diabetic Foot Exam:   Community Resource Referral / Chronic Care Management: CRR required this visit?  No   CCM required this visit?  No     Plan:     I have personally reviewed and noted the following in the patient's chart:   Medical and social history Use of alcohol, tobacco or illicit drugs  Current medications and supplements including opioid prescriptions. Patient is not currently taking opioid prescriptions. Functional ability and status Nutritional status Physical activity Advanced directives List of other physicians Hospitalizations, surgeries, and ER visits in previous 12 months Vitals Screenings to include cognitive, depression, and falls Referrals and appointments  In addition, I have reviewed and discussed with patient certain preventive protocols, quality metrics, and best practice recommendations. A written personalized care plan for preventive services as well as general preventive health recommendations were provided to patient.     Maryan Puls, LPN   [id number]/01/8118   After Visit Summary: (MyChart) Due to this being a telephonic visit, the after visit summary with patients personalized plan was offered to patient via  MyChart   Nurse Notes: none

## 2023-01-16 ENCOUNTER — Ambulatory Visit (INDEPENDENT_AMBULATORY_CARE_PROVIDER_SITE_OTHER): Payer: Medicare Other | Admitting: Family Medicine

## 2023-01-16 ENCOUNTER — Encounter: Payer: Self-pay | Admitting: Family Medicine

## 2023-01-16 VITALS — BP 120/68 | HR 56 | Temp 98.7°F | Ht 64.5 in | Wt 207.1 lb

## 2023-01-16 DIAGNOSIS — I10 Essential (primary) hypertension: Secondary | ICD-10-CM | POA: Diagnosis not present

## 2023-01-16 DIAGNOSIS — N1831 Chronic kidney disease, stage 3a: Secondary | ICD-10-CM

## 2023-01-16 DIAGNOSIS — E782 Mixed hyperlipidemia: Secondary | ICD-10-CM | POA: Diagnosis not present

## 2023-01-16 DIAGNOSIS — I5033 Acute on chronic diastolic (congestive) heart failure: Secondary | ICD-10-CM

## 2023-01-16 NOTE — Assessment & Plan Note (Signed)
Chronic, euvolemic in office today on Maxide

## 2023-01-16 NOTE — Assessment & Plan Note (Signed)
Stable, chronic.  Continue current medication.  Crestor 10 mg on Monday Wednesday Friday

## 2023-01-16 NOTE — Progress Notes (Signed)
Patient ID: Darlene Lawrence, female    DOB: [birth year], 82 y.o.   MRN: [id number]  This visit was conducted in person.  BP 120/68 (BP Location: Right Arm, Patient Position: Sitting, Cuff Size: Large)   Pulse (!) 56   Temp 98.7 F (37.1 C) (Temporal)   Ht 5' 4.5" (1.638 m)   Wt 207 lb 2 oz (94 kg)   LMP  (LMP Unknown)   SpO2 96%   BMI 35.00 kg/m    CC:   Chief Complaint  Patient presents with   Annual Exam    Part 2    Subjective:   HPI: Darlene Lawrence is a 82 y.o. female presenting on 01/16/2023 for Annual Exam (Part 2)  The patient presents for   review of chronic health problems. He/She also has the following acute concerns today: a couple of episodes of gas pain in abdomen. Felt better after passing gas. Used gas X  The patient saw a LPN or RN for medicare wellness visit.  Prevention and wellness was reviewed in detail. Note reviewed and important notes copied below.  CKD stage 3: Stable avoiding nonsteroidal anti-inflammatories.    Hypertension: Blood pressure well controlled on 1/2 tablet  triamterene hydrochlorothiazide 37.5 mg / 25 mg p.o. daily BP Readings from Last 3 Encounters:  01/16/23 120/68  04/11/22 130/70  04/04/22 (!) 192/97  Using medication without problems or lightheadedness: None Chest pain with exertion: None Edema: Intermittent Short of breath: None Average home BPs: not checking. Other issues:  HFpEF: Euvolemic in office today using Maxide.  Elevated Cholesterol: LDL at goal less than 70 on Crestor 3 days a week Lab Results  Component Value Date   CHOL 130 01/09/2023   HDL 35.40 (L) 01/09/2023   LDLCALC 73 01/09/2023   TRIG 107.0 01/09/2023   CHOLHDL 4 01/09/2023  Using medications without problems: none Muscle aches:  Diet compliance:  minimal Exercise: minimal Other complaints:   Has varicose veins.Marland Kitchen only mildly painful.      Has had some decreased appetite at dinner.. no GI issues. Denies depression Body mass  index is 35 kg/m. Wt Readings from Last 3 Encounters:  01/16/23 207 lb 2 oz (94 kg)  01/15/23 205 lb (93 kg)  04/11/22 218 lb 8 oz (99.1 kg)     Relevant past medical, surgical, family and social history reviewed and updated as indicated. Interim medical history since our last visit reviewed. Allergies and medications reviewed and updated. Outpatient Medications Prior to Visit  Medication Sig Dispense Refill   Aspirin (ASPIR-81 PO) Take 1 tablet by mouth.     b complex vitamins tablet Take 1 tablet by mouth daily.     Calcium Citrate-Vitamin D (CALCIUM CITRATE + PO) Take 1 tablet by mouth daily.     Cholecalciferol (VITAMIN D3) 2000 UNITS TABS Take 1 capsule by mouth 2 (two) times daily.     clindamycin (CLEOCIN) 150 MG capsule Take 150 mg by mouth as needed. Takes 4 tablets before a dental procedure     Coenzyme Q10 (COQ10) 100 MG CAPS Take 1 capsule by mouth daily.     fluticasone (FLONASE) 50 MCG/ACT nasal spray Place into both nostrils daily.     Misc Natural Products (GLUCOSAMINE CHOND MSM FORMULA PO) Take 1,500 mg by mouth.     Multiple Vitamin (MULTI VITAMIN DAILY PO) Take 1 tablet by mouth.     Omega-3 Fatty Acids (FISH OIL) 1200 MG CAPS Take 1 capsule by mouth.  rosuvastatin (CRESTOR) 10 MG tablet Take 10 mg by mouth every Monday, Wednesday, and Friday.     triamterene-hydrochlorothiazide (MAXZIDE-25) 37.5-25 MG tablet TAKE 1/2 TABLET BY MOUTH EVERY DAY 45 tablet 3   amoxicillin-clavulanate (AUGMENTIN) 875-125 MG tablet Take 1 tablet by mouth every 12 (twelve) hours. 14 tablet 0   No facility-administered medications prior to visit.     Per HPI unless specifically indicated in ROS section below Review of Systems  Constitutional:  Negative for fatigue and fever.  HENT:  Negative for congestion.   Eyes:  Negative for pain.  Respiratory:  Negative for cough and shortness of breath.   Cardiovascular:  Negative for chest pain, palpitations and leg swelling.   Gastrointestinal:  Negative for abdominal pain.  Genitourinary:  Negative for dysuria and vaginal bleeding.  Musculoskeletal:  Negative for back pain.  Neurological:  Negative for syncope, light-headedness and headaches.  Psychiatric/Behavioral:  Negative for dysphoric mood.    Objective:  BP 120/68 (BP Location: Right Arm, Patient Position: Sitting, Cuff Size: Large)   Pulse (!) 56   Temp 98.7 F (37.1 C) (Temporal)   Ht 5' 4.5" (1.638 m)   Wt 207 lb 2 oz (94 kg)   LMP  (LMP Unknown)   SpO2 96%   BMI 35.00 kg/m   Wt Readings from Last 3 Encounters:  01/16/23 207 lb 2 oz (94 kg)  01/15/23 205 lb (93 kg)  04/11/22 218 lb 8 oz (99.1 kg)      Physical Exam Vitals and nursing note reviewed.  Constitutional:      General: She is not in acute distress.    Appearance: Normal appearance. She is well-developed. She is obese. She is not ill-appearing or toxic-appearing.  HENT:     Head: Normocephalic.     Right Ear: Hearing, tympanic membrane, ear canal and external ear normal.     Left Ear: Hearing, tympanic membrane, ear canal and external ear normal.     Nose: Nose normal.  Eyes:     General: Lids are normal. Lids are everted, no foreign bodies appreciated.     Conjunctiva/sclera: Conjunctivae normal.     Pupils: Pupils are equal, round, and reactive to light.  Neck:     Thyroid: No thyroid mass or thyromegaly.     Vascular: No carotid bruit.     Trachea: Trachea normal.  Cardiovascular:     Rate and Rhythm: Normal rate and regular rhythm.     Heart sounds: Normal heart sounds, S1 normal and S2 normal. No murmur heard.    No gallop.  Pulmonary:     Effort: Pulmonary effort is normal. No respiratory distress.     Breath sounds: Normal breath sounds. No wheezing, rhonchi or rales.  Abdominal:     General: Bowel sounds are normal. There is no distension or abdominal bruit.     Palpations: Abdomen is soft. There is no fluid wave or mass.     Tenderness: There is no abdominal  tenderness. There is no guarding or rebound.     Hernia: No hernia is present.  Musculoskeletal:     Cervical back: Normal range of motion and neck supple.  Lymphadenopathy:     Cervical: No cervical adenopathy.  Skin:    General: Skin is warm and dry.     Findings: No rash.  Neurological:     Mental Status: She is alert.     Cranial Nerves: No cranial nerve deficit.     Sensory: No sensory deficit.  Psychiatric:        Mood and Affect: Mood is not anxious or depressed.        Speech: Speech normal.        Behavior: Behavior normal. Behavior is cooperative.        Judgment: Judgment normal.       Results for orders placed or performed in visit on 01/09/23  Lipid panel  Result Value Ref Range   Cholesterol 130 0 - 200 mg/dL   Triglycerides 130.8 0.0 - 149.0 mg/dL   HDL 65.78 (L) >46.96 mg/dL   VLDL 29.5 0.0 - 28.4 mg/dL   LDL Cholesterol 73 0 - 99 mg/dL   Total CHOL/HDL Ratio 4    NonHDL 94.49   Comprehensive metabolic panel  Result Value Ref Range   Sodium 140 135 - 145 mEq/L   Potassium 4.1 3.5 - 5.1 mEq/L   Chloride 102 96 - 112 mEq/L   CO2 31 19 - 32 mEq/L   Glucose, Bld 92 70 - 99 mg/dL   BUN 28 (H) 6 - 23 mg/dL   Creatinine, Ser 1.32 0.40 - 1.20 mg/dL   Total Bilirubin 0.5 0.2 - 1.2 mg/dL   Alkaline Phosphatase 57 39 - 117 U/L   AST 17 0 - 37 U/L   ALT 16 0 - 35 U/L   Total Protein 7.0 6.0 - 8.3 g/dL   Albumin 3.6 3.5 - 5.2 g/dL   GFR 44.01 (L) >02.72 mL/min   Calcium 9.4 8.4 - 10.5 mg/dL     COVID 19 screen:  No recent travel or known exposure to COVID19 The patient denies respiratory symptoms of COVID 19 at this time. The importance of social distancing was discussed today.   Assessment and Plan   The patient's preventative maintenance and recommended screening tests for an annual wellness exam were reviewed in full today. Brought up to date unless services declined.  Counselled on the importance of diet, exercise, and its role in overall health and  mortality. The patient's FH and SH was reviewed, including their home life, tobacco status, and drug and alcohol status.   Vaccines: Uptodate . S/P COVID19 vaccine x 2, consider shingrix.   PAP/DVE: Per Dr. Marice Potter.  Following uterine prolapse. Decided to not move ahead with hysterectomy given SE risks.   No further indicated. Breast exam/mammogram:  11/2021 hx of breast cancer 2009.Marland Kitchen  Scheduled February 02, 2023 Colonoscopy: Dr. Matthias Hughs  12/04/20 normal, no further indicated Nonsmoker   DEXA:  stable osteopenia in 2022, repeat in 2 to 5 years Hep C done   Problem List Items Addressed This Visit     CKD (chronic kidney disease) stage 3, GFR 30-59 ml/min (HCC) (Chronic)    Stable at GFR 42      Essential hypertension (Chronic)    Stable, chronic.  Continue current medication.  well controlled on 1/2 tablet  triamterene hydrochlorothiazide 37.5 mg / 25 mg p.o. daily       Heart failure with preserved ejection fraction (HCC) (Chronic)    Chronic, euvolemic in office today on Maxide      Hyperlipidemia - Primary (Chronic)    Stable, chronic.  Continue current medication.  Crestor 10 mg on Monday Wednesday Friday           Kerby Nora, MD

## 2023-01-16 NOTE — Assessment & Plan Note (Signed)
Stable at GFR 42

## 2023-01-16 NOTE — Assessment & Plan Note (Addendum)
Stable, chronic.  Continue current medication.  well controlled on 1/2 tablet  triamterene hydrochlorothiazide 37.5 mg / 25 mg p.o. daily

## 2023-01-20 DIAGNOSIS — M62830 Muscle spasm of back: Secondary | ICD-10-CM | POA: Diagnosis not present

## 2023-01-20 DIAGNOSIS — M9903 Segmental and somatic dysfunction of lumbar region: Secondary | ICD-10-CM | POA: Diagnosis not present

## 2023-01-20 DIAGNOSIS — M5136 Other intervertebral disc degeneration, lumbar region: Secondary | ICD-10-CM | POA: Diagnosis not present

## 2023-01-20 DIAGNOSIS — M9902 Segmental and somatic dysfunction of thoracic region: Secondary | ICD-10-CM | POA: Diagnosis not present

## 2023-02-02 ENCOUNTER — Ambulatory Visit
Admission: RE | Admit: 2023-02-02 | Discharge: 2023-02-02 | Disposition: A | Discharge status: Home / Self Care | Payer: Medicare Other | Source: Ambulatory Visit | Attending: Family Medicine | Admitting: Family Medicine

## 2023-02-02 DIAGNOSIS — Z1231 Encounter for screening mammogram for malignant neoplasm of breast: Secondary | ICD-10-CM

## 2023-02-04 DIAGNOSIS — M9902 Segmental and somatic dysfunction of thoracic region: Secondary | ICD-10-CM | POA: Diagnosis not present

## 2023-02-04 DIAGNOSIS — M9903 Segmental and somatic dysfunction of lumbar region: Secondary | ICD-10-CM | POA: Diagnosis not present

## 2023-02-04 DIAGNOSIS — M5136 Other intervertebral disc degeneration, lumbar region: Secondary | ICD-10-CM | POA: Diagnosis not present

## 2023-02-04 DIAGNOSIS — M62830 Muscle spasm of back: Secondary | ICD-10-CM | POA: Diagnosis not present

## 2023-02-11 DIAGNOSIS — H2513 Age-related nuclear cataract, bilateral: Secondary | ICD-10-CM | POA: Diagnosis not present

## 2023-02-11 DIAGNOSIS — H524 Presbyopia: Secondary | ICD-10-CM | POA: Diagnosis not present

## 2023-02-11 DIAGNOSIS — H5213 Myopia, bilateral: Secondary | ICD-10-CM | POA: Diagnosis not present

## 2023-02-11 DIAGNOSIS — H52223 Regular astigmatism, bilateral: Secondary | ICD-10-CM | POA: Diagnosis not present

## 2023-02-17 DIAGNOSIS — M9902 Segmental and somatic dysfunction of thoracic region: Secondary | ICD-10-CM | POA: Diagnosis not present

## 2023-02-17 DIAGNOSIS — M5136 Other intervertebral disc degeneration, lumbar region with discogenic back pain only: Secondary | ICD-10-CM | POA: Diagnosis not present

## 2023-02-17 DIAGNOSIS — M62830 Muscle spasm of back: Secondary | ICD-10-CM | POA: Diagnosis not present

## 2023-02-17 DIAGNOSIS — M9903 Segmental and somatic dysfunction of lumbar region: Secondary | ICD-10-CM | POA: Diagnosis not present

## 2023-02-18 ENCOUNTER — Other Ambulatory Visit: Payer: Self-pay | Admitting: Family Medicine

## 2023-03-10 DIAGNOSIS — M5136 Other intervertebral disc degeneration, lumbar region with discogenic back pain only: Secondary | ICD-10-CM | POA: Diagnosis not present

## 2023-03-10 DIAGNOSIS — M9902 Segmental and somatic dysfunction of thoracic region: Secondary | ICD-10-CM | POA: Diagnosis not present

## 2023-03-10 DIAGNOSIS — M9903 Segmental and somatic dysfunction of lumbar region: Secondary | ICD-10-CM | POA: Diagnosis not present

## 2023-03-10 DIAGNOSIS — M62830 Muscle spasm of back: Secondary | ICD-10-CM | POA: Diagnosis not present

## 2023-03-17 DIAGNOSIS — M5136 Other intervertebral disc degeneration, lumbar region with discogenic back pain only: Secondary | ICD-10-CM | POA: Diagnosis not present

## 2023-03-17 DIAGNOSIS — Z23 Encounter for immunization: Secondary | ICD-10-CM | POA: Diagnosis not present

## 2023-03-17 DIAGNOSIS — M62830 Muscle spasm of back: Secondary | ICD-10-CM | POA: Diagnosis not present

## 2023-03-17 DIAGNOSIS — M9902 Segmental and somatic dysfunction of thoracic region: Secondary | ICD-10-CM | POA: Diagnosis not present

## 2023-03-17 DIAGNOSIS — M9903 Segmental and somatic dysfunction of lumbar region: Secondary | ICD-10-CM | POA: Diagnosis not present

## 2023-03-20 DIAGNOSIS — M5136 Other intervertebral disc degeneration, lumbar region with discogenic back pain only: Secondary | ICD-10-CM | POA: Diagnosis not present

## 2023-03-20 DIAGNOSIS — M9903 Segmental and somatic dysfunction of lumbar region: Secondary | ICD-10-CM | POA: Diagnosis not present

## 2023-03-20 DIAGNOSIS — M9902 Segmental and somatic dysfunction of thoracic region: Secondary | ICD-10-CM | POA: Diagnosis not present

## 2023-03-20 DIAGNOSIS — M62830 Muscle spasm of back: Secondary | ICD-10-CM | POA: Diagnosis not present

## 2023-03-24 DIAGNOSIS — M9903 Segmental and somatic dysfunction of lumbar region: Secondary | ICD-10-CM | POA: Diagnosis not present

## 2023-03-24 DIAGNOSIS — M5136 Other intervertebral disc degeneration, lumbar region with discogenic back pain only: Secondary | ICD-10-CM | POA: Diagnosis not present

## 2023-03-24 DIAGNOSIS — M62830 Muscle spasm of back: Secondary | ICD-10-CM | POA: Diagnosis not present

## 2023-03-24 DIAGNOSIS — M9902 Segmental and somatic dysfunction of thoracic region: Secondary | ICD-10-CM | POA: Diagnosis not present

## 2023-03-27 DIAGNOSIS — M62830 Muscle spasm of back: Secondary | ICD-10-CM | POA: Diagnosis not present

## 2023-03-27 DIAGNOSIS — M9903 Segmental and somatic dysfunction of lumbar region: Secondary | ICD-10-CM | POA: Diagnosis not present

## 2023-03-27 DIAGNOSIS — M9902 Segmental and somatic dysfunction of thoracic region: Secondary | ICD-10-CM | POA: Diagnosis not present

## 2023-03-27 DIAGNOSIS — M5136 Other intervertebral disc degeneration, lumbar region with discogenic back pain only: Secondary | ICD-10-CM | POA: Diagnosis not present

## 2023-04-15 DIAGNOSIS — M9902 Segmental and somatic dysfunction of thoracic region: Secondary | ICD-10-CM | POA: Diagnosis not present

## 2023-04-15 DIAGNOSIS — M5136 Other intervertebral disc degeneration, lumbar region with discogenic back pain only: Secondary | ICD-10-CM | POA: Diagnosis not present

## 2023-04-15 DIAGNOSIS — M62830 Muscle spasm of back: Secondary | ICD-10-CM | POA: Diagnosis not present

## 2023-04-15 DIAGNOSIS — M9903 Segmental and somatic dysfunction of lumbar region: Secondary | ICD-10-CM | POA: Diagnosis not present

## 2023-04-28 DIAGNOSIS — H6123 Impacted cerumen, bilateral: Secondary | ICD-10-CM | POA: Diagnosis not present

## 2023-04-28 DIAGNOSIS — H6983 Other specified disorders of Eustachian tube, bilateral: Secondary | ICD-10-CM | POA: Diagnosis not present

## 2023-04-28 DIAGNOSIS — H903 Sensorineural hearing loss, bilateral: Secondary | ICD-10-CM | POA: Diagnosis not present

## 2023-05-18 DIAGNOSIS — M5136 Other intervertebral disc degeneration, lumbar region with discogenic back pain only: Secondary | ICD-10-CM | POA: Diagnosis not present

## 2023-05-18 DIAGNOSIS — M62830 Muscle spasm of back: Secondary | ICD-10-CM | POA: Diagnosis not present

## 2023-05-18 DIAGNOSIS — M9903 Segmental and somatic dysfunction of lumbar region: Secondary | ICD-10-CM | POA: Diagnosis not present

## 2023-05-18 DIAGNOSIS — M9902 Segmental and somatic dysfunction of thoracic region: Secondary | ICD-10-CM | POA: Diagnosis not present

## 2023-06-16 DIAGNOSIS — M9903 Segmental and somatic dysfunction of lumbar region: Secondary | ICD-10-CM | POA: Diagnosis not present

## 2023-06-16 DIAGNOSIS — M62830 Muscle spasm of back: Secondary | ICD-10-CM | POA: Diagnosis not present

## 2023-06-16 DIAGNOSIS — M5136 Other intervertebral disc degeneration, lumbar region with discogenic back pain only: Secondary | ICD-10-CM | POA: Diagnosis not present

## 2023-06-16 DIAGNOSIS — M9902 Segmental and somatic dysfunction of thoracic region: Secondary | ICD-10-CM | POA: Diagnosis not present

## 2023-06-17 DIAGNOSIS — M9903 Segmental and somatic dysfunction of lumbar region: Secondary | ICD-10-CM | POA: Diagnosis not present

## 2023-06-17 DIAGNOSIS — M9902 Segmental and somatic dysfunction of thoracic region: Secondary | ICD-10-CM | POA: Diagnosis not present

## 2023-06-17 DIAGNOSIS — M5136 Other intervertebral disc degeneration, lumbar region with discogenic back pain only: Secondary | ICD-10-CM | POA: Diagnosis not present

## 2023-06-17 DIAGNOSIS — M62830 Muscle spasm of back: Secondary | ICD-10-CM | POA: Diagnosis not present

## 2023-06-18 DIAGNOSIS — M9903 Segmental and somatic dysfunction of lumbar region: Secondary | ICD-10-CM | POA: Diagnosis not present

## 2023-06-18 DIAGNOSIS — M62830 Muscle spasm of back: Secondary | ICD-10-CM | POA: Diagnosis not present

## 2023-06-18 DIAGNOSIS — M9902 Segmental and somatic dysfunction of thoracic region: Secondary | ICD-10-CM | POA: Diagnosis not present

## 2023-06-18 DIAGNOSIS — M5136 Other intervertebral disc degeneration, lumbar region with discogenic back pain only: Secondary | ICD-10-CM | POA: Diagnosis not present

## 2023-07-14 DIAGNOSIS — M9902 Segmental and somatic dysfunction of thoracic region: Secondary | ICD-10-CM | POA: Diagnosis not present

## 2023-07-14 DIAGNOSIS — M62830 Muscle spasm of back: Secondary | ICD-10-CM | POA: Diagnosis not present

## 2023-07-14 DIAGNOSIS — M5136 Other intervertebral disc degeneration, lumbar region with discogenic back pain only: Secondary | ICD-10-CM | POA: Diagnosis not present

## 2023-07-14 DIAGNOSIS — M9903 Segmental and somatic dysfunction of lumbar region: Secondary | ICD-10-CM | POA: Diagnosis not present

## 2023-08-11 DIAGNOSIS — M5136 Other intervertebral disc degeneration, lumbar region with discogenic back pain only: Secondary | ICD-10-CM | POA: Diagnosis not present

## 2023-08-11 DIAGNOSIS — M9902 Segmental and somatic dysfunction of thoracic region: Secondary | ICD-10-CM | POA: Diagnosis not present

## 2023-08-11 DIAGNOSIS — M9903 Segmental and somatic dysfunction of lumbar region: Secondary | ICD-10-CM | POA: Diagnosis not present

## 2023-08-11 DIAGNOSIS — M62830 Muscle spasm of back: Secondary | ICD-10-CM | POA: Diagnosis not present

## 2023-09-08 DIAGNOSIS — M9903 Segmental and somatic dysfunction of lumbar region: Secondary | ICD-10-CM | POA: Diagnosis not present

## 2023-09-08 DIAGNOSIS — M5136 Other intervertebral disc degeneration, lumbar region with discogenic back pain only: Secondary | ICD-10-CM | POA: Diagnosis not present

## 2023-09-08 DIAGNOSIS — M62830 Muscle spasm of back: Secondary | ICD-10-CM | POA: Diagnosis not present

## 2023-09-08 DIAGNOSIS — M9902 Segmental and somatic dysfunction of thoracic region: Secondary | ICD-10-CM | POA: Diagnosis not present

## 2023-09-16 ENCOUNTER — Ambulatory Visit
Admission: RE | Admit: 2023-09-16 | Discharge: 2023-09-16 | Disposition: A | Discharge status: Home / Self Care | Payer: Medicare Other | Source: Ambulatory Visit | Attending: Family Medicine | Admitting: Family Medicine

## 2023-09-16 ENCOUNTER — Encounter: Payer: Self-pay | Admitting: Family Medicine

## 2023-09-16 DIAGNOSIS — Z780 Asymptomatic menopausal state: Secondary | ICD-10-CM

## 2023-09-16 DIAGNOSIS — N958 Other specified menopausal and perimenopausal disorders: Secondary | ICD-10-CM | POA: Diagnosis not present

## 2023-09-16 DIAGNOSIS — M8588 Other specified disorders of bone density and structure, other site: Secondary | ICD-10-CM | POA: Diagnosis not present

## 2023-10-06 DIAGNOSIS — M5136 Other intervertebral disc degeneration, lumbar region with discogenic back pain only: Secondary | ICD-10-CM | POA: Diagnosis not present

## 2023-10-06 DIAGNOSIS — M62830 Muscle spasm of back: Secondary | ICD-10-CM | POA: Diagnosis not present

## 2023-10-06 DIAGNOSIS — M9903 Segmental and somatic dysfunction of lumbar region: Secondary | ICD-10-CM | POA: Diagnosis not present

## 2023-10-06 DIAGNOSIS — M9902 Segmental and somatic dysfunction of thoracic region: Secondary | ICD-10-CM | POA: Diagnosis not present

## 2023-11-03 DIAGNOSIS — M9902 Segmental and somatic dysfunction of thoracic region: Secondary | ICD-10-CM | POA: Diagnosis not present

## 2023-11-03 DIAGNOSIS — M9903 Segmental and somatic dysfunction of lumbar region: Secondary | ICD-10-CM | POA: Diagnosis not present

## 2023-11-03 DIAGNOSIS — M62830 Muscle spasm of back: Secondary | ICD-10-CM | POA: Diagnosis not present

## 2023-11-03 DIAGNOSIS — M5136 Other intervertebral disc degeneration, lumbar region with discogenic back pain only: Secondary | ICD-10-CM | POA: Diagnosis not present

## 2023-11-09 DIAGNOSIS — H2513 Age-related nuclear cataract, bilateral: Secondary | ICD-10-CM | POA: Diagnosis not present

## 2023-11-09 DIAGNOSIS — Q142 Congenital malformation of optic disc: Secondary | ICD-10-CM | POA: Diagnosis not present

## 2023-11-09 DIAGNOSIS — H04123 Dry eye syndrome of bilateral lacrimal glands: Secondary | ICD-10-CM | POA: Diagnosis not present

## 2023-11-09 DIAGNOSIS — H3321 Serous retinal detachment, right eye: Secondary | ICD-10-CM | POA: Diagnosis not present

## 2023-11-17 DIAGNOSIS — H353211 Exudative age-related macular degeneration, right eye, with active choroidal neovascularization: Secondary | ICD-10-CM | POA: Diagnosis not present

## 2023-11-17 DIAGNOSIS — Q142 Congenital malformation of optic disc: Secondary | ICD-10-CM | POA: Diagnosis not present

## 2023-11-17 DIAGNOSIS — H2513 Age-related nuclear cataract, bilateral: Secondary | ICD-10-CM | POA: Diagnosis not present

## 2023-11-20 DIAGNOSIS — H6063 Unspecified chronic otitis externa, bilateral: Secondary | ICD-10-CM | POA: Diagnosis not present

## 2023-11-20 DIAGNOSIS — H903 Sensorineural hearing loss, bilateral: Secondary | ICD-10-CM | POA: Diagnosis not present

## 2023-11-20 DIAGNOSIS — H6123 Impacted cerumen, bilateral: Secondary | ICD-10-CM | POA: Diagnosis not present

## 2023-11-20 DIAGNOSIS — H6982 Other specified disorders of Eustachian tube, left ear: Secondary | ICD-10-CM | POA: Diagnosis not present

## 2023-12-01 DIAGNOSIS — M62830 Muscle spasm of back: Secondary | ICD-10-CM | POA: Diagnosis not present

## 2023-12-01 DIAGNOSIS — M9903 Segmental and somatic dysfunction of lumbar region: Secondary | ICD-10-CM | POA: Diagnosis not present

## 2023-12-01 DIAGNOSIS — M9902 Segmental and somatic dysfunction of thoracic region: Secondary | ICD-10-CM | POA: Diagnosis not present

## 2023-12-01 DIAGNOSIS — M5136 Other intervertebral disc degeneration, lumbar region with discogenic back pain only: Secondary | ICD-10-CM | POA: Diagnosis not present

## 2023-12-29 DIAGNOSIS — M9903 Segmental and somatic dysfunction of lumbar region: Secondary | ICD-10-CM | POA: Diagnosis not present

## 2023-12-29 DIAGNOSIS — M9902 Segmental and somatic dysfunction of thoracic region: Secondary | ICD-10-CM | POA: Diagnosis not present

## 2023-12-29 DIAGNOSIS — M62830 Muscle spasm of back: Secondary | ICD-10-CM | POA: Diagnosis not present

## 2023-12-29 DIAGNOSIS — M5136 Other intervertebral disc degeneration, lumbar region with discogenic back pain only: Secondary | ICD-10-CM | POA: Diagnosis not present

## 2023-12-30 ENCOUNTER — Telehealth: Payer: Self-pay | Admitting: *Deleted

## 2023-12-30 DIAGNOSIS — E782 Mixed hyperlipidemia: Secondary | ICD-10-CM

## 2023-12-30 NOTE — Telephone Encounter (Signed)
-----   Message from Harlene Du sent at 12/29/2023  3:55 PM EDT ----- Regarding: Labs Fri 01/15/23 Hello,   Patient has a lab appointment on Friday 01/15/24. Can we get lab orders please.   Thanks

## 2024-01-12 DIAGNOSIS — H353211 Exudative age-related macular degeneration, right eye, with active choroidal neovascularization: Secondary | ICD-10-CM | POA: Diagnosis not present

## 2024-01-12 DIAGNOSIS — H2513 Age-related nuclear cataract, bilateral: Secondary | ICD-10-CM | POA: Diagnosis not present

## 2024-01-15 ENCOUNTER — Other Ambulatory Visit: Payer: Medicare Other

## 2024-01-19 ENCOUNTER — Ambulatory Visit: Payer: Self-pay | Admitting: Family Medicine

## 2024-01-19 ENCOUNTER — Other Ambulatory Visit (INDEPENDENT_AMBULATORY_CARE_PROVIDER_SITE_OTHER)

## 2024-01-19 DIAGNOSIS — E782 Mixed hyperlipidemia: Secondary | ICD-10-CM | POA: Diagnosis not present

## 2024-01-19 LAB — COMPREHENSIVE METABOLIC PANEL WITH GFR
ALT: 21 U/L (ref 0–35)
AST: 19 U/L (ref 0–37)
Albumin: 3.9 g/dL (ref 3.5–5.2)
Alkaline Phosphatase: 47 U/L (ref 39–117)
BUN: 32 mg/dL — ABNORMAL HIGH (ref 6–23)
CO2: 28 meq/L (ref 19–32)
Calcium: 9.4 mg/dL (ref 8.4–10.5)
Chloride: 105 meq/L (ref 96–112)
Creatinine, Ser: 1.28 mg/dL — ABNORMAL HIGH (ref 0.40–1.20)
GFR: 39.35 mL/min — ABNORMAL LOW (ref >60.00)
Glucose, Bld: 97 mg/dL (ref 70–99)
Potassium: 4.1 meq/L (ref 3.5–5.1)
Sodium: 141 meq/L (ref 135–145)
Total Bilirubin: 0.5 mg/dL (ref 0.2–1.2)
Total Protein: 7.5 g/dL (ref 6.0–8.3)

## 2024-01-19 LAB — LIPID PANEL
Cholesterol: 132 mg/dL (ref 0–200)
HDL: 42.1 mg/dL (ref >39.00)
LDL Cholesterol: 70 mg/dL (ref 0–99)
NonHDL: 89.54
Total CHOL/HDL Ratio: 3
Triglycerides: 100 mg/dL (ref 0.0–149.0)
VLDL: 20 mg/dL (ref 0.0–40.0)

## 2024-01-19 NOTE — Progress Notes (Signed)
 No critical labs need to be addressed urgently. We will discuss labs in detail at upcoming office visit.

## 2024-01-22 ENCOUNTER — Other Ambulatory Visit: Payer: Self-pay | Admitting: Family Medicine

## 2024-01-22 ENCOUNTER — Ambulatory Visit: Payer: Medicare Other | Admitting: Family Medicine

## 2024-01-22 ENCOUNTER — Encounter: Payer: Self-pay | Admitting: Family Medicine

## 2024-01-22 VITALS — BP 138/76 | HR 54 | Temp 97.7°F | Ht 64.0 in | Wt 208.2 lb

## 2024-01-22 DIAGNOSIS — I5030 Unspecified diastolic (congestive) heart failure: Secondary | ICD-10-CM | POA: Diagnosis not present

## 2024-01-22 DIAGNOSIS — E782 Mixed hyperlipidemia: Secondary | ICD-10-CM | POA: Diagnosis not present

## 2024-01-22 DIAGNOSIS — N183 Chronic kidney disease, stage 3 unspecified: Secondary | ICD-10-CM

## 2024-01-22 DIAGNOSIS — I10 Essential (primary) hypertension: Secondary | ICD-10-CM

## 2024-01-22 DIAGNOSIS — I130 Hypertensive heart and chronic kidney disease with heart failure and stage 1 through stage 4 chronic kidney disease, or unspecified chronic kidney disease: Secondary | ICD-10-CM

## 2024-01-22 DIAGNOSIS — Z1231 Encounter for screening mammogram for malignant neoplasm of breast: Secondary | ICD-10-CM

## 2024-01-22 DIAGNOSIS — N1831 Chronic kidney disease, stage 3a: Secondary | ICD-10-CM

## 2024-01-22 DIAGNOSIS — I5033 Acute on chronic diastolic (congestive) heart failure: Secondary | ICD-10-CM

## 2024-01-22 NOTE — Assessment & Plan Note (Signed)
Chronic, euvolemic in office today on Maxide

## 2024-01-22 NOTE — Assessment & Plan Note (Signed)
Stable, chronic.  Continue current medication.  Crestor 10 mg on Monday Wednesday Friday

## 2024-01-22 NOTE — Assessment & Plan Note (Signed)
 Stable, likely due to chronic hypertension

## 2024-01-22 NOTE — Progress Notes (Signed)
 Patient ID: Darlene Lawrence, female    DOB: 13-Jul-1940, 83 y.o.   MRN: [id number]  This visit was conducted in person.  BP 138/76   Pulse (!) 54   Temp 97.7 F (36.5 C) (Temporal)   Ht 5' 4 (1.626 m)   Wt 208 lb 4 oz (94.5 kg)   LMP  (LMP Unknown)   SpO2 97%   BMI 35.75 kg/m    CC:   Chief Complaint  Patient presents with   Annual Exam    AWV scheduled for 01/25/24.     Subjective:   HPI: Darlene Lawrence is a 83 y.o. female presenting on 01/22/2024 for Annual Exam (AWV scheduled for 01/25/24. )  The patient presents for   review of chronic health problems. He/She also has the following acute concerns today:   Upper and low back pain if up on feet.  No numbness or weakness  The patient saw a LPN or RN for medicare wellness visit. 01/25/2024  Prevention and wellness was reviewed in detail. Note reviewed and important notes copied below.  CKD stage 3: Stable avoiding nonsteroidal anti-inflammatories.    Hypertension: Blood pressure well controlled on 1/2 tablet  triamterene  hydrochlorothiazide  37.5 mg / 25 mg p.o. daily BP Readings from Last 3 Encounters:  01/22/24 138/76  01/16/23 120/68  04/11/22 130/70  Using medication without problems or lightheadedness: None Chest pain with exertion: None Edema: Intermittent Short of breath: None Average home BPs: not checking. Other issues:  HFpEF: Euvolemic in office today using Maxide.  Elevated Cholesterol: LDL at goal less than 70 on Crestor  3 days a week Lab Results  Component Value Date   CHOL 132 01/19/2024   HDL 42.10 01/19/2024   LDLCALC 70 01/19/2024   TRIG 100.0 01/19/2024   CHOLHDL 3 01/19/2024  Using medications without problems: none Muscle aches:  Diet compliance:  minimal Exercise: minimal Other complaints:   Has varicose veins.[name] only mildly painful.  Body mass index is 35.75 kg/m. Wt Readings from Last 3 Encounters:  01/22/24 208 lb 4 oz (94.5 kg)  01/16/23 207 lb 2 oz (94 kg)   01/15/23 205 lb (93 kg)     Relevant past medical, surgical, family and social history reviewed and updated as indicated. Interim medical history since our last visit reviewed. Allergies and medications reviewed and updated. Outpatient Medications Prior to Visit  Medication Sig Dispense Refill   Aspirin  (ASPIR-81 PO) Take 1 tablet by mouth.     b complex vitamins tablet Take 1 tablet by mouth daily.     Calcium  Citrate-Vitamin D  (CALCIUM  CITRATE + PO) Take 1 tablet by mouth daily.     Cholecalciferol  (VITAMIN D3) 2000 UNITS TABS Take 1 capsule by mouth 2 (two) times daily.     clindamycin (CLEOCIN) 150 MG capsule Take 150 mg by mouth as needed. Takes 4 tablets before a dental procedure     Coenzyme Q10 (COQ10) 100 MG CAPS Take 1 capsule by mouth daily.     fluticasone (FLONASE) 50 MCG/ACT nasal spray Place into both nostrils daily.     Misc Natural Products (GLUCOSAMINE CHOND MSM FORMULA PO) Take 1,500 mg by mouth.     Multiple Vitamin (MULTI VITAMIN DAILY PO) Take 1 tablet by mouth.     Omega-3 Fatty Acids (FISH OIL) 1200 MG CAPS Take 1 capsule by mouth.     rosuvastatin  (CRESTOR ) 10 MG tablet Take 10 mg by mouth every Monday, Wednesday, and Friday.     triamterene -hydrochlorothiazide  (  MAXZIDE -25) 37.5-25 MG tablet TAKE 1/2 TABLET BY MOUTH DAILY 45 tablet 3   No facility-administered medications prior to visit.     Per HPI unless specifically indicated in ROS section below Review of Systems  Constitutional:  Negative for fatigue and fever.  HENT:  Negative for congestion.   Eyes:  Negative for pain.  Respiratory:  Negative for cough and shortness of breath.   Cardiovascular:  Negative for chest pain, palpitations and leg swelling.  Gastrointestinal:  Negative for abdominal pain.  Genitourinary:  Negative for dysuria and vaginal bleeding.  Musculoskeletal:  Negative for back pain.  Neurological:  Negative for syncope, light-headedness and headaches.  Psychiatric/Behavioral:   Negative for dysphoric mood.    Objective:  BP 138/76   Pulse (!) 54   Temp 97.7 F (36.5 C) (Temporal)   Ht 5' 4 (1.626 m)   Wt 208 lb 4 oz (94.5 kg)   LMP  (LMP Unknown)   SpO2 97%   BMI 35.75 kg/m   Wt Readings from Last 3 Encounters:  01/22/24 208 lb 4 oz (94.5 kg)  01/16/23 207 lb 2 oz (94 kg)  01/15/23 205 lb (93 kg)      Physical Exam Vitals and nursing note reviewed.  Constitutional:      General: She is not in acute distress.    Appearance: Normal appearance. She is well-developed. She is obese. She is not ill-appearing or toxic-appearing.  HENT:     Head: Normocephalic.     Right Ear: Hearing, tympanic membrane, ear canal and external ear normal.     Left Ear: Hearing, tympanic membrane, ear canal and external ear normal.     Nose: Nose normal.  Eyes:     General: Lids are normal. Lids are everted, no foreign bodies appreciated.     Conjunctiva/sclera: Conjunctivae normal.     Pupils: Pupils are equal, round, and reactive to light.  Neck:     Thyroid : No thyroid  mass or thyromegaly.     Vascular: No carotid bruit.     Trachea: Trachea normal.  Cardiovascular:     Rate and Rhythm: Normal rate and regular rhythm.     Heart sounds: Normal heart sounds, S1 normal and S2 normal. No murmur heard.    No gallop.  Pulmonary:     Effort: Pulmonary effort is normal. No respiratory distress.     Breath sounds: Normal breath sounds. No wheezing, rhonchi or rales.  Abdominal:     General: Bowel sounds are normal. There is no distension or abdominal bruit.     Palpations: Abdomen is soft. There is no fluid wave or mass.     Tenderness: There is no abdominal tenderness. There is no guarding or rebound.     Hernia: No hernia is present.  Musculoskeletal:     Cervical back: Normal range of motion and neck supple.  Lymphadenopathy:     Cervical: No cervical adenopathy.  Skin:    General: Skin is warm and dry.     Findings: No rash.  Neurological:     Mental Status:  She is alert.     Cranial Nerves: No cranial nerve deficit.     Sensory: No sensory deficit.  Psychiatric:        Mood and Affect: Mood is not anxious or depressed.        Speech: Speech normal.        Behavior: Behavior normal. Behavior is cooperative.        Judgment: Judgment normal.  Results for orders placed or performed in visit on 01/19/24  Lipid panel   Collection Time: 01/19/24  8:15 AM  Result Value Ref Range   Cholesterol 132 0 - 200 mg/dL   Triglycerides 899.9 0.0 - 149.0 mg/dL   HDL 57.89 >60.99 mg/dL   VLDL 79.9 0.0 - 59.9 mg/dL   LDL Cholesterol 70 0 - 99 mg/dL   Total CHOL/HDL Ratio 3    NonHDL 89.54   Comprehensive metabolic panel   Collection Time: 01/19/24  8:15 AM  Result Value Ref Range   Sodium 141 135 - 145 mEq/L   Potassium 4.1 3.5 - 5.1 mEq/L   Chloride 105 96 - 112 mEq/L   CO2 28 19 - 32 mEq/L   Glucose, Bld 97 70 - 99 mg/dL   BUN 32 (H) 6 - 23 mg/dL   Creatinine, Ser 8.71 (H) 0.40 - 1.20 mg/dL   Total Bilirubin 0.5 0.2 - 1.2 mg/dL   Alkaline Phosphatase 47 39 - 117 U/L   AST 19 0 - 37 U/L   ALT 21 0 - 35 U/L   Total Protein 7.5 6.0 - 8.3 g/dL   Albumin 3.9 3.5 - 5.2 g/dL   GFR 60.64 (L) >39.99 mL/min   Calcium  9.4 8.4 - 10.5 mg/dL     COVID 19 screen:  No recent travel or known exposure to COVID19 The patient denies respiratory symptoms of COVID 19 at this time. The importance of social distancing was discussed today.   Assessment and Plan   The patient's preventative maintenance and recommended screening tests for an annual wellness exam were reviewed in full today. Brought up to date unless services declined.  Counselled on the importance of diet, exercise, and its role in overall health and mortality. The patient's FH and SH was reviewed, including their home life, tobacco status, and drug and alcohol status.   Vaccines: Uptodate . S/P COVID19 vaccine x 2, consider shingrix, Tdap  PAP/DVE: Per Dr. Starla.  Following uterine  prolapse. Decided to not move ahead with hysterectomy given SE risks.   No further indicated. Breast exam/mammogram:  11/2021 hx of breast cancer 2009.[name]  Nml February 02, 2023, scheduled 02/2024 Colonoscopy: Dr. Donnald  12/04/20 normal, no further indicated Nonsmoker   DEXA:  stable osteopenia in 2022, repeat in 2 to 5 years Hep C done   Problem List Items Addressed This Visit     CKD (chronic kidney disease) stage 3, GFR 30-59 ml/min (HCC) (Chronic)   Stable, likely due to chronic hypertension      Essential hypertension - Primary (Chronic)   Stable, chronic.  Continue current medication.  well controlled on 1/2 tablet  triamterene  hydrochlorothiazide  37.5 mg / 25 mg p.o. daily       Heart failure with preserved ejection fraction (HCC) (Chronic)   Chronic, euvolemic in office today on Maxide      Hyperlipidemia (Chronic)   Stable, chronic.  Continue current medication.  Crestor  10 mg on Monday Wednesday Friday            Greig Ring, MD

## 2024-01-22 NOTE — Assessment & Plan Note (Signed)
Stable, chronic.  Continue current medication.  well controlled on 1/2 tablet  triamterene hydrochlorothiazide 37.5 mg / 25 mg p.o. daily

## 2024-01-25 ENCOUNTER — Ambulatory Visit (INDEPENDENT_AMBULATORY_CARE_PROVIDER_SITE_OTHER): Payer: Medicare Other

## 2024-01-25 VITALS — BP 138/76 | Ht 64.0 in | Wt 208.0 lb

## 2024-01-25 DIAGNOSIS — Z0000 Encounter for general adult medical examination without abnormal findings: Secondary | ICD-10-CM | POA: Diagnosis not present

## 2024-01-25 NOTE — Patient Instructions (Signed)
 Darlene Lawrence,  Thank you for taking the time for your Medicare Wellness Visit. I appreciate your continued commitment to your health goals. Please review the care plan we discussed, and feel free to reach out if I can assist you further.  Medicare recommends these wellness visits once per year to help you and your care team stay ahead of potential health issues. These visits are designed to focus on prevention, allowing your provider to concentrate on managing your acute and chronic conditions during your regular appointments.  Please note that Annual Wellness Visits do not include a physical exam. Some assessments may be limited, especially if the visit was conducted virtually. If needed, we may recommend a separate in-person follow-up with your provider.  Ongoing Care Seeing your primary care provider every 3 to 6 months helps us  monitor your health and provide consistent, personalized care.   Referrals If a referral was made during today's visit and you haven't received any updates within two weeks, please contact the referred provider directly to check on the status.  Recommended Screenings:  Health Maintenance  Topic Date Due   Zoster (Shingles) Vaccine (1 of 2) 10/08/1992   DTaP/Tdap/Td vaccine (3 - Td or Tdap) 11/10/2021   COVID-19 Vaccine (3 - 2025-26 season) 01/11/2024   Breast Cancer Screening  02/02/2024   Flu Shot  08/09/2024*   Medicare Annual Wellness Visit  01/24/2025   Pneumococcal Vaccine for age over 70  Completed   DEXA scan (bone density measurement)  Completed   HPV Vaccine  Aged Out   Meningitis B Vaccine  Aged Out   Colon Cancer Screening  Discontinued   Hepatitis C Screening  Discontinued  *Topic was postponed. The date shown is not the original due date.       01/25/2024    9:39 AM  Advanced Directives  Does Patient Have a Medical Advance Directive? Yes  Type of Estate agent of [place];Living will  Does patient want to make changes  to medical advance directive? No - Patient declined  Copy of Healthcare Power of Attorney in Chart? Yes - validated most recent copy scanned in chart (See row information)   Advance Care Planning is important because it: Ensures you receive medical care that aligns with your values, goals, and preferences. Provides guidance to your family and loved ones, reducing the emotional burden of decision-making during critical moments.  Vision: Annual vision screenings are recommended for early detection of glaucoma, cataracts, and diabetic retinopathy. These exams can also reveal signs of chronic conditions such as diabetes and high blood pressure.  Dental: Annual dental screenings help detect early signs of oral cancer, gum disease, and other conditions linked to overall health, including heart disease and diabetes.  Please see the attached documents for additional preventive care recommendations.

## 2024-01-25 NOTE — Progress Notes (Signed)
 Because this visit was a virtual/telehealth visit,  certain criteria was not obtained, such a blood pressure, CBG if applicable, and timed get up and go. Any medications not marked as taking were not mentioned during the medication reconciliation part of the visit. Any vitals not documented were not able to be obtained due to this being a telehealth visit or patient was unable to self-report a recent blood pressure reading due to a lack of equipment at home via telehealth. Vitals that have been documented are verbally provided by the patient.  This visit was performed by a medical professional under my direct supervision. I was immediately available for consultation/collaboration. I have reviewed and agree with the Annual Wellness Visit documentation.  Subjective:   Darlene Lawrence is a 83 y.o. who presents for a Medicare Wellness preventive visit.  As a reminder, Annual Wellness Visits don't include a physical exam, and some assessments may be limited, especially if this visit is performed virtually. We may recommend an in-person follow-up visit with your provider if needed.  Visit Complete: Virtual I connected with  Darlene Lawrence on 01/25/24 by a audio enabled telemedicine application and verified that I am speaking with the correct person using two identifiers.  Patient Location: Home  Provider Location: Home Office  I discussed the limitations of evaluation and management by telemedicine. The patient expressed understanding and agreed to proceed.  Vital Signs: Because this visit was a virtual/telehealth visit, some criteria may be missing or patient reported. Any vitals not documented were not able to be obtained and vitals that have been documented are patient reported.  VideoDeclined- This patient declined Librarian, academic. Therefore the visit was completed with audio only.  Persons Participating in Visit: Patient.  AWV Questionnaire: Yes:  Patient Medicare AWV questionnaire was completed by the patient on 01/25/2024; I have confirmed that all information answered by patient is correct and no changes since this date.  Cardiac Risk Factors include: advanced age (>65men, >52 women);obesity (BMI >30kg/m2);hypertension     Objective:    Today's Vitals   01/25/24 0939  BP: 138/76  Weight: 208 lb (94.3 kg)  Height: 5' 4 (1.626 m)   Body mass index is 35.7 kg/m.     01/25/2024    9:39 AM 01/15/2023    9:53 AM 04/04/2022   10:11 AM 12/20/2021    8:14 AM 12/10/2020    9:00 AM 11/30/2019    9:46 AM 11/06/2017   10:16 AM  Advanced Directives  Does Patient Have a Medical Advance Directive? Yes Yes No Yes Yes Yes Yes   Type of Estate agent of [place];Living will Healthcare Power of [place];Living will  Healthcare Power of [place];Living will Healthcare Power of [place];Living will Healthcare Power of [place];Living will Healthcare Power of [place];Living will  Does patient want to make changes to medical advance directive? No - Patient declined        Copy of Healthcare Power of Attorney in Chart? Yes - validated most recent copy scanned in chart (See row information) No - copy requested  No - copy requested No - copy requested No - copy requested Yes      Data saved with a previous flowsheet row definition    Current Medications (verified) Outpatient Encounter Medications as of 01/25/2024  Medication Sig   Aspirin  (ASPIR-81 PO) Take 1 tablet by mouth.   b complex vitamins tablet Take 1 tablet by mouth daily.   Calcium  Citrate-Vitamin D  (CALCIUM  CITRATE + PO) Take  1 tablet by mouth daily.   Cholecalciferol  (VITAMIN D3) 2000 UNITS TABS Take 1 capsule by mouth 2 (two) times daily.   clindamycin (CLEOCIN) 150 MG capsule Take 150 mg by mouth as needed. Takes 4 tablets before a dental procedure   Coenzyme Q10 (COQ10) 100 MG CAPS Take 1 capsule by mouth daily.   fluticasone (FLONASE) 50 MCG/ACT nasal spray  Place into both nostrils daily.   Misc Natural Products (GLUCOSAMINE CHOND MSM FORMULA PO) Take 1,500 mg by mouth.   Multiple Vitamin (MULTI VITAMIN DAILY PO) Take 1 tablet by mouth.   Omega-3 Fatty Acids (FISH OIL) 1200 MG CAPS Take 1 capsule by mouth.   rosuvastatin  (CRESTOR ) 10 MG tablet Take 10 mg by mouth every Monday, Wednesday, and Friday.   triamterene -hydrochlorothiazide  (MAXZIDE -25) 37.5-25 MG tablet TAKE 1/2 TABLET BY MOUTH DAILY   No facility-administered encounter medications on file as of 01/25/2024.    Allergies (verified) Cefixime, Ciprofloxacin, Levaquin  [levofloxacin ], and Penicillins   History: Past Medical History:  Diagnosis Date   Abnormal Pap smear    Allergy    Arthritis    Blood transfusion without reported diagnosis 04/26/1974   child birth   breast cancer 2009   right   Cancer [facility]) 2009   Breast Cancer   Cataract    CHF (congestive heart failure) (HCC) May 2018   heart attack   DDD (degenerative disc disease), lumbar    High cholesterol    Hyperlipidemia    Hypertension    Meniscus tear    right   Oxygen  deficiency May 2018   after heart attack   Personal history of radiation therapy    RBBB    Past Surgical History:  Procedure Laterality Date   BREAST BIOPSY Right 08/18/2007   BREAST LUMPECTOMY Right 2009   BREAST SURGERY Right 2009   lumpectomy, no radiation, no chemo   DILATION AND CURETTAGE OF UTERUS  2012   Dr. Orval Sierra   HERNIA REPAIR  07/07   umbilical   JOINT REPLACEMENT Left 2012 & 2015   hip   LEFT HEART CATH AND CORONARY ANGIOGRAPHY N/A 09/17/2016   Procedure: Left Heart Cath and Coronary Angiography;  Surgeon: Bosie Vinie LABOR, MD;  Location: [facility] INVASIVE CV LAB;  Service: Cardiovascular;  Laterality: N/A;   TOTAL HIP ARTHROPLASTY Left    TOTAL KNEE ARTHROPLASTY Right 06/06/2013   Procedure: RIGHT TOTAL KNEE ARTHROPLASTY;  Surgeon: Dempsey LULLA Moan, MD;  Location: [facility];  Service: Orthopedics;  Laterality: Right;    TUBAL LIGATION  1980   Family History  Problem Relation Age of Onset   Cancer Mother        breast and colon   Breast cancer Mother    Hearing loss Mother    Varicose Veins Mother    Hypertension Father    Heart disease Father    Thyroid  disease Father    Hearing loss Father    Down syndrome Son    Intellectual disability Son    Varicose Veins Son    Asthma Other    Asthma Sister    Hearing loss Sister    Vision loss Sister    Asthma Brother    COPD Brother    Hearing loss Brother    Social History   Socioeconomic History   Marital status: Married    Spouse name: Not on file   Number of children: 2   Years of education: Not on file   Highest education level: Associate degree: academic  program  Occupational History   Occupation: advertising for store in [employer]    Employer: RETIRED  Tobacco Use   Smoking status: Never   Smokeless tobacco: Never  Vaping Use   Vaping status: Never Used  Substance and Sexual Activity   Alcohol use: Yes    Comment: very little - once a month or so   Drug use: No   Sexual activity: Not Currently    Partners: Male    Birth control/protection: Post-menopausal  Other Topics Concern   Not on file  Social History Narrative   Regular exercise--yes, curves 3 times a week   Diet-- fruits and veggies, water, no fast food       Social Drivers of Health   Financial Resource Strain: Low Risk  (01/18/2024)   Overall Financial Resource Strain (CARDIA)    Difficulty of Paying Living Expenses: Not hard at all  Food Insecurity: No Food Insecurity (01/25/2024)   Hunger Vital Sign    Worried About Running Out of Food in the Last Year: Never true    Ran Out of Food in the Last Year: Never true  Transportation Needs: No Transportation Needs (01/18/2024)   PRAPARE - Administrator, Civil Service (Medical): No    Lack of Transportation (Non-Medical): No  Physical Activity: Insufficiently Active (01/18/2024)   Exercise Vital Sign    Days  of Exercise per Week: 1 day    Minutes of Exercise per Session: 30 min  Stress: No Stress Concern Present (01/25/2024)   [employer] of Occupational Health - Occupational Stress Questionnaire    Feeling of Stress: Not at all  Social Connections: Socially Integrated (01/18/2024)   Social Connection and Isolation Panel    Frequency of Communication with Friends and Family: Three times a week    Frequency of Social Gatherings with Friends and Family: Once a week    Attends Religious Services: More than 4 times per year    Active Member of [employer] or Organizations: Yes    Attends Engineer, structural: More than 4 times per year    Marital Status: Married    Tobacco Counseling Counseling given: Not Answered    Clinical Intake:  Pre-visit preparation completed: Yes  Pain : No/denies pain     BMI - recorded: 35.7 Nutritional Status: BMI > 30  Obese Nutritional Risks: None Diabetes: No  No results found for: HGBA1C   How often do you need to have someone help you when you read instructions, pamphlets, or other written materials from your doctor or pharmacy?: 1 - Never  Interpreter Needed?: No  Information entered by :: De Libman,CMA   Activities of Daily Living     01/25/2024    9:11 AM  In your present state of health, do you have any difficulty performing the following activities:  Hearing? 0  Vision? 0  Difficulty concentrating or making decisions? 0  Walking or climbing stairs? 0  Dressing or bathing? 0  Doing errands, shopping? 0  Preparing Food and eating ? N  Using the Toilet? N  In the past six months, have you accidently leaked urine? Y  Do you have problems with loss of bowel control? N  Managing your Medications? N  Managing your Finances? N  Housekeeping or managing your Housekeeping? N    Patient Care Team: Avelina Greig BRAVO, MD as PCP - General Odean Potts, MD as Consulting Physician (Hematology and Oncology) Melodi Lerner, MD  as Consulting Physician (Orthopedic Surgery) Gray,  Harland BROCKS, MD as Consulting Physician (Obstetrics and Gynecology) Mandy Fleeta RAMAN, DO as Referring Physician (Optometry) Donette Ellouise LABOR, FNP as Nurse Practitioner (Family Medicine) Bosie Vinie LABOR, MD as Consulting Physician (Cardiology)  I have updated your Care Teams any recent Medical Services you may have received from other providers in the past year.     Assessment:   This is a routine wellness examination for [place].  Hearing/Vision screen Hearing Screening - Comments:: Patient wears hearing aids Vision Screening - Comments:: Patient wears glasses    Goals Addressed             This Visit's Progress    Patient Stated   On track    Starting 11/06/2017, I will resume exercising for 30 minutes 3 days per week.        Depression Screen     01/25/2024    9:42 AM 01/22/2024   11:06 AM 01/15/2023    9:49 AM 12/20/2021    8:15 AM 12/10/2020    9:01 AM 11/30/2019    9:46 AM 11/30/2018   10:22 AM  PHQ 2/9 Scores  PHQ - 2 Score 0 0 0 0 0 0 0  PHQ- 9 Score 0 2   0 0     Fall Risk     01/25/2024    9:11 AM 01/22/2024   11:06 AM 01/15/2023   10:03 AM 01/15/2023    9:54 AM 12/20/2021    8:15 AM  Fall Risk   Falls in the past year? 0 1 0 0 0  Number falls in past yr: 0 0  0 0  Injury with Fall? 0 0  0 0  Risk for fall due to : No Fall Risks No Fall Risks  No Fall Risks Medication side effect  Follow up Falls evaluation completed   Falls prevention discussed;Falls evaluation completed Falls evaluation completed;Education provided;Falls prevention discussed      Data saved with a previous flowsheet row definition    MEDICARE RISK AT HOME:  Medicare Risk at Home Any stairs in or around the home?: (Patient-Rptd) Yes If so, are there any without handrails?: (Patient-Rptd) No Home free of loose throw rugs in walkways, pet beds, electrical cords, etc?: (Patient-Rptd) Yes Adequate lighting in your home to reduce risk of falls?:  (Patient-Rptd) Yes Life alert?: (Patient-Rptd) No Use of a cane, walker or w/c?: (Patient-Rptd) No Grab bars in the bathroom?: (Patient-Rptd) No Shower chair or bench in shower?: (Patient-Rptd) Yes Elevated toilet seat or a handicapped toilet?: (Patient-Rptd) No  TIMED UP AND GO:  Was the test performed?  No  Cognitive Function: 6CIT completed    12/10/2020    9:03 AM 11/30/2019    9:51 AM 11/06/2017   10:05 AM 11/03/2016   10:20 AM 10/11/2015    9:40 AM  MMSE - Mini Mental State Exam  Not completed: Refused      Orientation to time  5 5 5  5    Orientation to Place  5 5 5  5    Registration  3 3 3  3    Attention/ Calculation  5 0 0  0   Recall  3 3 3  3    Language- name 2 objects   0 0  0   Language- repeat  1 1 1 1   Language- follow 3 step command   3 3  3    Language- read & follow direction   0 0  0   Write a sentence   0 0  0   Copy design   0 0  0   Total score   20 20  20       Data saved with a previous flowsheet row definition        01/25/2024    9:43 AM 01/15/2023    9:56 AM 12/20/2021    8:16 AM  6CIT Screen  What Year? 0 points 0 points 0 points  What month? 0 points 0 points 0 points  What time? 0 points 0 points 0 points  Count back from 20 0 points 0 points 0 points  Months in reverse 0 points 0 points 0 points  Repeat phrase 0 points 8 points 0 points  Total Score 0 points 8 points 0 points    Immunizations Immunization History  Administered Date(s) Administered   Fluad Quad(high Dose 65+) 02/03/2022   Influenza Split 02/19/2011, 02/23/2012   Influenza Whole 02/10/2007, 02/15/2008, 03/06/2010   Influenza,inj,Quad PF,6+ Mos 02/20/2014, 03/10/2017   Influenza-Unspecified 02/05/2016, 05/12/2018   PFIZER(Purple Top)SARS-COV-2 Vaccination 06/03/2019, 06/24/2019   Pneumococcal Conjugate-13 07/04/2014   Pneumococcal Polysaccharide-23 07/11/2008   Td 09/09/2000   Tdap 11/11/2011   Zoster, Live 11/14/2009    Screening Tests Health Maintenance  Topic Date  Due   Zoster Vaccines- Shingrix (1 of 2) 10/08/1992   DTaP/Tdap/Td (3 - Td or Tdap) 11/10/2021   COVID-19 Vaccine (3 - 2025-26 season) 01/11/2024   Mammogram  02/02/2024   Influenza Vaccine  08/09/2024 (Originally 12/11/2023)   Medicare Annual Wellness (AWV)  01/24/2025   Pneumococcal Vaccine: 50+ Years  Completed   DEXA SCAN  Completed   HPV VACCINES  Aged Out   Meningococcal B Vaccine  Aged Out   Colonoscopy  Discontinued   Hepatitis C Screening  Discontinued    Health Maintenance Items Addressed:patient declined vaccination  Additional Screening:  Vision Screening: Recommended annual ophthalmology exams for early detection of glaucoma and other disorders of the eye. Is the patient up to date with their annual eye exam?  No  Who is the provider or what is the name of the office in which the patient attends annual eye exams?   Dental Screening: Recommended annual dental exams for proper oral hygiene  Community Resource Referral / Chronic Care Management: CRR required this visit?  No   CCM required this visit?  No   Plan:    I have personally reviewed and noted the following in the patient's chart:   Medical and social history Use of alcohol, tobacco or illicit drugs  Current medications and supplements including opioid prescriptions. Patient is not currently taking opioid prescriptions. Functional ability and status Nutritional status Physical activity Advanced directives List of other physicians Hospitalizations, surgeries, and ER visits in previous 12 months Vitals Screenings to include cognitive, depression, and falls Referrals and appointments  In addition, I have reviewed and discussed with patient certain preventive protocols, quality metrics, and best practice recommendations. A written personalized care plan for preventive services as well as general preventive health recommendations were provided to patient.   Lyle MARLA Right, [place]   01/25/2024   After  Visit Summary: (MyChart) Due to this being a telephonic visit, the after visit summary with patients personalized plan was offered to patient via MyChart   Notes: Nothing significant to report at this time.

## 2024-01-26 DIAGNOSIS — M9902 Segmental and somatic dysfunction of thoracic region: Secondary | ICD-10-CM | POA: Diagnosis not present

## 2024-01-26 DIAGNOSIS — M62830 Muscle spasm of back: Secondary | ICD-10-CM | POA: Diagnosis not present

## 2024-01-26 DIAGNOSIS — M5136 Other intervertebral disc degeneration, lumbar region with discogenic back pain only: Secondary | ICD-10-CM | POA: Diagnosis not present

## 2024-01-26 DIAGNOSIS — M9903 Segmental and somatic dysfunction of lumbar region: Secondary | ICD-10-CM | POA: Diagnosis not present

## 2024-02-12 ENCOUNTER — Ambulatory Visit
Admission: RE | Admit: 2024-02-12 | Discharge: 2024-02-12 | Disposition: A | Discharge status: Home / Self Care | Payer: Medicare Other | Source: Ambulatory Visit | Attending: Family Medicine | Admitting: Family Medicine

## 2024-02-12 ENCOUNTER — Other Ambulatory Visit: Payer: Self-pay | Admitting: Family Medicine

## 2024-02-12 DIAGNOSIS — Z1231 Encounter for screening mammogram for malignant neoplasm of breast: Secondary | ICD-10-CM

## 2024-02-16 DIAGNOSIS — M62830 Muscle spasm of back: Secondary | ICD-10-CM | POA: Diagnosis not present

## 2024-02-16 DIAGNOSIS — M9903 Segmental and somatic dysfunction of lumbar region: Secondary | ICD-10-CM | POA: Diagnosis not present

## 2024-02-16 DIAGNOSIS — M9902 Segmental and somatic dysfunction of thoracic region: Secondary | ICD-10-CM | POA: Diagnosis not present

## 2024-02-16 DIAGNOSIS — M5136 Other intervertebral disc degeneration, lumbar region with discogenic back pain only: Secondary | ICD-10-CM | POA: Diagnosis not present

## 2024-02-17 ENCOUNTER — Ambulatory Visit: Payer: Self-pay | Admitting: Family Medicine

## 2024-03-08 ENCOUNTER — Ambulatory Visit (INDEPENDENT_AMBULATORY_CARE_PROVIDER_SITE_OTHER): Payer: PRIVATE HEALTH INSURANCE | Admitting: Family Medicine

## 2024-03-08 ENCOUNTER — Encounter: Payer: Self-pay | Admitting: Family Medicine

## 2024-03-08 ENCOUNTER — Ambulatory Visit: Payer: Self-pay | Admitting: Family Medicine

## 2024-03-08 DIAGNOSIS — I5033 Acute on chronic diastolic (congestive) heart failure: Secondary | ICD-10-CM

## 2024-03-08 DIAGNOSIS — I110 Hypertensive heart disease with heart failure: Secondary | ICD-10-CM

## 2024-03-08 DIAGNOSIS — R400 Somnolence: Secondary | ICD-10-CM | POA: Diagnosis not present

## 2024-03-08 DIAGNOSIS — R0683 Snoring: Secondary | ICD-10-CM | POA: Insufficient documentation

## 2024-03-08 DIAGNOSIS — R001 Bradycardia, unspecified: Secondary | ICD-10-CM | POA: Insufficient documentation

## 2024-03-08 DIAGNOSIS — I252 Old myocardial infarction: Secondary | ICD-10-CM | POA: Insufficient documentation

## 2024-03-08 DIAGNOSIS — I10 Essential (primary) hypertension: Secondary | ICD-10-CM

## 2024-03-08 LAB — VITAMIN B12: Vitamin B-12: >1500 pg/mL — ABNORMAL HIGH (ref 211–911)

## 2024-03-08 LAB — COMPREHENSIVE METABOLIC PANEL WITH GFR
ALT: 25 U/L (ref 0–35)
AST: 23 U/L (ref 0–37)
Albumin: 4.1 g/dL (ref 3.5–5.2)
Alkaline Phosphatase: 51 U/L (ref 39–117)
BUN: 30 mg/dL — ABNORMAL HIGH (ref 6–23)
CO2: 28 meq/L (ref 19–32)
Calcium: 9.4 mg/dL (ref 8.4–10.5)
Chloride: 104 meq/L (ref 96–112)
Creatinine, Ser: 1.15 mg/dL (ref 0.40–1.20)
GFR: 44.71 mL/min — ABNORMAL LOW (ref >60.00)
Glucose, Bld: 100 mg/dL — ABNORMAL HIGH (ref 70–99)
Potassium: 4 meq/L (ref 3.5–5.1)
Sodium: 142 meq/L (ref 135–145)
Total Bilirubin: 0.5 mg/dL (ref 0.2–1.2)
Total Protein: 7.2 g/dL (ref 6.0–8.3)

## 2024-03-08 LAB — AMMONIA: Ammonia: 34 umol/L (ref 11–35)

## 2024-03-08 LAB — CBC WITH DIFFERENTIAL/PLATELET
Basophils Absolute: 0 K/uL (ref 0.0–0.1)
Basophils Relative: 0.8 % (ref 0.0–3.0)
Eosinophils Absolute: 0.1 K/uL (ref 0.0–0.7)
Eosinophils Relative: 3.7 % (ref 0.0–5.0)
HCT: 38.9 % (ref 36.0–46.0)
Hemoglobin: 13.1 g/dL (ref 12.0–15.0)
Lymphocytes Relative: 50.7 % — ABNORMAL HIGH (ref 12.0–46.0)
Lymphs Abs: 1.9 K/uL (ref 0.7–4.0)
MCHC: 33.7 g/dL (ref 30.0–36.0)
MCV: 101 fl — ABNORMAL HIGH (ref 78.0–100.0)
Monocytes Absolute: 0.3 K/uL (ref 0.1–1.0)
Monocytes Relative: 7.6 % (ref 3.0–12.0)
Neutro Abs: 1.4 K/uL (ref 1.4–7.7)
Neutrophils Relative %: 37.2 % — ABNORMAL LOW (ref 43.0–77.0)
Platelets: 132 K/uL — ABNORMAL LOW (ref 150.0–400.0)
RBC: 3.85 Mil/uL — ABNORMAL LOW (ref 3.87–5.11)
RDW: 14.4 % (ref 11.5–15.5)
WBC: 3.7 K/uL — ABNORMAL LOW (ref 4.0–10.5)

## 2024-03-08 LAB — TSH: TSH: 5.06 u[IU]/mL (ref 0.35–5.50)

## 2024-03-08 MED ORDER — ROSUVASTATIN CALCIUM 10 MG PO TABS: 10.0000 mg | ORAL_TABLET | ORAL | 3 refills | Status: AC

## 2024-03-08 NOTE — Assessment & Plan Note (Addendum)
 Neg cath , no CAD felt to be due to demand ischemia. EKG: bradycardia normal sinus rhythm, 1st degree AV block, no new ST change, no Q waves.

## 2024-03-08 NOTE — Patient Instructions (Addendum)
 Follow BP  ands HR at home.. call with measurement.

## 2024-03-08 NOTE — Assessment & Plan Note (Addendum)
 Follow BP at home... call if persistently > 140/90.   Previously well controlled on 1/2 tablet  triamterene  hydrochlorothiazide  37.5 mg / 25 mg p.o. daily
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
## 2024-03-08 NOTE — Addendum Note (Signed)
 Addended by: AVELINA NO E on: 03/08/2024 09:58 AM   Modules accepted: Orders
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
## 2024-03-08 NOTE — Assessment & Plan Note (Signed)
 Concern for possible sleep apnea because of daytime somnolence.  Will evaluate with home sleep study once lab results return.

## 2024-03-10 NOTE — Telephone Encounter (Signed)
 [employer] called to try and get an extension on the paperwork but they denied an extension.

## 2024-03-11 ENCOUNTER — Ambulatory Visit (HOSPITAL_COMMUNITY)
Admission: RE | Admit: 2024-03-11 | Discharge: 2024-03-11 | Disposition: A | Discharge status: Home / Self Care | Source: Ambulatory Visit | Attending: Family Medicine | Admitting: Family Medicine

## 2024-03-11 DIAGNOSIS — I5033 Acute on chronic diastolic (congestive) heart failure: Secondary | ICD-10-CM | POA: Insufficient documentation

## 2024-03-11 DIAGNOSIS — I499 Cardiac arrhythmia, unspecified: Secondary | ICD-10-CM | POA: Diagnosis not present

## 2024-03-11 DIAGNOSIS — I351 Nonrheumatic aortic (valve) insufficiency: Secondary | ICD-10-CM | POA: Insufficient documentation

## 2024-03-11 DIAGNOSIS — I130 Hypertensive heart and chronic kidney disease with heart failure and stage 1 through stage 4 chronic kidney disease, or unspecified chronic kidney disease: Secondary | ICD-10-CM | POA: Diagnosis not present

## 2024-03-11 DIAGNOSIS — I3481 Nonrheumatic mitral (valve) annulus calcification: Secondary | ICD-10-CM | POA: Insufficient documentation

## 2024-03-11 DIAGNOSIS — N189 Chronic kidney disease, unspecified: Secondary | ICD-10-CM | POA: Insufficient documentation

## 2024-03-11 DIAGNOSIS — E785 Hyperlipidemia, unspecified: Secondary | ICD-10-CM | POA: Insufficient documentation

## 2024-03-11 DIAGNOSIS — I7121 Aneurysm of the ascending aorta, without rupture: Secondary | ICD-10-CM | POA: Insufficient documentation

## 2024-03-11 DIAGNOSIS — I358 Other nonrheumatic aortic valve disorders: Secondary | ICD-10-CM | POA: Insufficient documentation

## 2024-03-11 DIAGNOSIS — I4510 Unspecified right bundle-branch block: Secondary | ICD-10-CM | POA: Insufficient documentation

## 2024-03-11 LAB — ECHOCARDIOGRAM COMPLETE
Area-P 1/2: 3.81 cm2
S' Lateral: 2.6 cm

## 2024-03-14 DIAGNOSIS — I429 Cardiomyopathy, unspecified: Secondary | ICD-10-CM | POA: Diagnosis not present

## 2024-03-14 DIAGNOSIS — N1831 Chronic kidney disease, stage 3a: Secondary | ICD-10-CM | POA: Diagnosis not present

## 2024-03-14 DIAGNOSIS — R55 Syncope and collapse: Secondary | ICD-10-CM | POA: Diagnosis not present

## 2024-03-14 DIAGNOSIS — E782 Mixed hyperlipidemia: Secondary | ICD-10-CM | POA: Diagnosis not present

## 2024-03-14 DIAGNOSIS — I10 Essential (primary) hypertension: Secondary | ICD-10-CM | POA: Diagnosis not present

## 2024-03-14 DIAGNOSIS — I77810 Thoracic aortic ectasia: Secondary | ICD-10-CM | POA: Diagnosis not present

## 2024-03-15 ENCOUNTER — Encounter: Payer: Self-pay | Admitting: Family Medicine

## 2024-03-15 ENCOUNTER — Other Ambulatory Visit: Payer: Self-pay | Admitting: Family Medicine

## 2024-03-15 DIAGNOSIS — M62830 Muscle spasm of back: Secondary | ICD-10-CM | POA: Diagnosis not present

## 2024-03-15 DIAGNOSIS — M9903 Segmental and somatic dysfunction of lumbar region: Secondary | ICD-10-CM | POA: Diagnosis not present

## 2024-03-15 DIAGNOSIS — M5136 Other intervertebral disc degeneration, lumbar region with discogenic back pain only: Secondary | ICD-10-CM | POA: Diagnosis not present

## 2024-03-15 DIAGNOSIS — M9902 Segmental and somatic dysfunction of thoracic region: Secondary | ICD-10-CM | POA: Diagnosis not present

## 2024-03-16 NOTE — Telephone Encounter (Signed)
Pt came by and picked up form

## 2024-03-17 DIAGNOSIS — H2513 Age-related nuclear cataract, bilateral: Secondary | ICD-10-CM | POA: Diagnosis not present

## 2024-03-23 ENCOUNTER — Telehealth: Admitting: Physician Assistant

## 2024-03-23 DIAGNOSIS — J208 Acute bronchitis due to other specified organisms: Secondary | ICD-10-CM | POA: Diagnosis not present

## 2024-03-23 DIAGNOSIS — B9689 Other specified bacterial agents as the cause of diseases classified elsewhere: Secondary | ICD-10-CM

## 2024-03-23 MED ORDER — DOXYCYCLINE HYCLATE 100 MG PO TABS: 100.0000 mg | ORAL_TABLET | Freq: Two times a day (BID) | ORAL | 0 refills | Status: AC

## 2024-03-23 MED ORDER — BENZONATATE 100 MG PO CAPS: 100.0000 mg | ORAL_CAPSULE | Freq: Three times a day (TID) | ORAL | 0 refills | Status: AC | PRN
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
## 2024-03-23 NOTE — Patient Instructions (Signed)
 Darlene Lawrence, thank you for joining Darlene Velma Lunger, PA-C for today's virtual visit.  While this provider is not your primary care provider (PCP), if your PCP is located in our provider database this encounter information will be shared with them immediately following your visit.   A [facility] MyChart account gives you access to today's visit and all your visits, tests, and labs performed at [facility]  click here if you don't have a  MyChart account or go to [email or web address]  Consent: (Patient) Darlene Lawrence provided verbal consent for this virtual visit at the beginning of the encounter.  Current Medications:  Current Outpatient Medications:    Aspirin  (ASPIR-81 PO), Take 1 tablet by mouth., Disp: , Rfl:    b complex vitamins tablet, Take 1 tablet by mouth daily., Disp: , Rfl:    Calcium  Citrate-Vitamin D  (CALCIUM  CITRATE + PO), Take 1 tablet by mouth daily., Disp: , Rfl:    Cholecalciferol  (VITAMIN D3) 2000 UNITS TABS, Take 1 capsule by mouth 2 (two) times daily., Disp: , Rfl:    clindamycin (CLEOCIN) 150 MG capsule, Take 150 mg by mouth as needed. Takes 4 tablets before a dental procedure, Disp: , Rfl:    Coenzyme Q10 (COQ10) 100 MG CAPS, Take 1 capsule by mouth daily., Disp: , Rfl:    fluticasone (FLONASE) 50 MCG/ACT nasal spray, Place into both nostrils daily., Disp: , Rfl:    Misc Natural Products (GLUCOSAMINE CHOND MSM FORMULA PO), Take 1,500 mg by mouth., Disp: , Rfl:    Multiple Vitamin (MULTI VITAMIN DAILY PO), Take 1 tablet by mouth., Disp: , Rfl:    Omega-3 Fatty Acids (FISH OIL) 1200 MG CAPS, Take 1 capsule by mouth., Disp: , Rfl:    rosuvastatin  (CRESTOR ) 10 MG tablet, Take 1 tablet (10 mg total) by mouth every Monday, Wednesday, and Friday., Disp: 90 tablet, Rfl: 3   triamterene -hydrochlorothiazide  (MAXZIDE -25) 37.5-25 MG tablet, TAKE 1/2 TABLET BY MOUTH DAILY, Disp: 45 tablet, Rfl: 3   Medications ordered in this  encounter:  No orders of the defined types were placed in this encounter.    *If you need refills on other medications prior to your next appointment, please contact your pharmacy*  Follow-Up: Call back or seek an in-person evaluation if the symptoms worsen or if the condition fails to improve as anticipated.  [facility] [phone]  Other Instructions Take antibiotic (Doxycycline ) as directed.  Increase fluids.  Get plenty of rest. Use Mucinex  for congestion. Tessalon  for cough. Take a daily probiotic (I recommend Align or Culturelle, but even Activia Yogurt may be beneficial).  A humidifier placed in the bedroom may offer some relief for a dry, scratchy throat of nasal irritation.  If you note any non-resolving, new, or worsening symptoms despite treatment, please seek an in-person evaluation ASAP.   Acute Bronchitis Bronchitis is when the airways that extend from the windpipe into the lungs get red, puffy, and painful (inflamed). Bronchitis often causes thick spit (mucus) to develop. This leads to a cough. A cough is the most common symptom of bronchitis. In acute bronchitis, the condition usually begins suddenly and goes away over time (usually in 2 weeks). Smoking, allergies, and asthma can make bronchitis worse. Repeated episodes of bronchitis may cause more lung problems.  HOME CARE Rest. Drink enough fluids to keep your pee (urine) clear or pale yellow (unless you need to limit fluids as told by your doctor). Only take over-the-counter or prescription medicines as told  by your doctor. Avoid smoking and secondhand smoke. These can make bronchitis worse. If you are a smoker, think about using nicotine gum or skin patches. Quitting smoking will help your lungs heal faster. Reduce the chance of getting bronchitis again by: Washing your hands often. Avoiding people with cold symptoms. Trying not to touch your hands to your mouth, nose, or eyes. Follow up with your  doctor as told.  GET HELP IF: Your symptoms do not improve after 1 week of treatment. Symptoms include: Cough. Fever. Coughing up thick spit. Body aches. Chest congestion. Chills. Shortness of breath. Sore throat.  GET HELP RIGHT AWAY IF:  You have an increased fever. You have chills. You have severe shortness of breath. You have bloody thick spit (sputum). You throw up (vomit) often. You lose too much body fluid (dehydration). You have a severe headache. You faint.  MAKE SURE YOU:  Understand these instructions. Will watch your condition. Will get help right away if you are not doing well or get worse. Document Released: 10/15/2007 Document Revised: 12/29/2012 Document Reviewed: 10/19/2012 [facility] Patient Information 2015 [place], [place]. This information is not intended to replace advice given to you by your health care provider. Make sure you discuss any questions you have with your health care provider.    If you have been instructed to have an in-person evaluation today at a local Urgent Care facility, please use the link below. It will take you to a list of all of our available [facility], including address, phone number and hours of operation. Please do not delay care.  [facility]  If you or a family member do not have a primary care provider, use the link below to schedule a visit and establish care. When you choose a [facility] primary care physician or advanced practice provider, you gain a long-term partner in health. Find a Primary Care Provider  Learn more about [facility]'s in-office and virtual care options: [facility] - Get Care Now

## 2024-03-28 ENCOUNTER — Ambulatory Visit (INDEPENDENT_AMBULATORY_CARE_PROVIDER_SITE_OTHER): Payer: PRIVATE HEALTH INSURANCE | Admitting: Vascular Surgery

## 2024-03-28 ENCOUNTER — Encounter (INDEPENDENT_AMBULATORY_CARE_PROVIDER_SITE_OTHER): Payer: Self-pay | Admitting: Vascular Surgery

## 2024-03-28 VITALS — BP 155/76 | HR 55 | Resp 18 | Ht 65.0 in | Wt 203.2 lb

## 2024-03-28 DIAGNOSIS — I10 Essential (primary) hypertension: Secondary | ICD-10-CM

## 2024-03-28 DIAGNOSIS — I7121 Aneurysm of the ascending aorta, without rupture: Secondary | ICD-10-CM

## 2024-03-28 DIAGNOSIS — E782 Mixed hyperlipidemia: Secondary | ICD-10-CM

## 2024-03-28 DIAGNOSIS — I739 Peripheral vascular disease, unspecified: Secondary | ICD-10-CM | POA: Diagnosis not present

## 2024-03-28 NOTE — Progress Notes (Signed)
 MRN : [id number]  Darlene Lawrence is a 83 y.o. (Mar 19, 1941) female who presents with chief complaint of check circulation.  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 The aneurysm was found incidentally by CT scan. Patient denies chest pain or unusual back pain, no other chest or abdominal complaints.  No history of an abrupt onset of a painful toe associated with blue discoloration.     No family history of TAA/AAA.   Patient denies amaurosis fugax or TIA symptoms.  There is no history of claudication or rest pain symptoms of the lower extremities.   The patient denies angina or shortness of breath.  Cardiac echo dated 03/11/2024, shows an ascending TAA that measures 5.1 cm   Current Meds  Medication Sig   Aspirin  (ASPIR-81 PO) Take 1 tablet by mouth.   b complex vitamins tablet Take 1 tablet by mouth daily.   benzonatate  (TESSALON ) 100 MG capsule Take 1 capsule (100 mg total) by mouth 3 (three) times daily as needed for cough.   Calcium  Citrate-Vitamin D  (CALCIUM  CITRATE + PO) Take 1 tablet by mouth daily.   Cholecalciferol  (VITAMIN D3) 2000 UNITS TABS Take 1 capsule by mouth 2 (two) times daily.   clindamycin (CLEOCIN) 150 MG capsule Take 150 mg by mouth as needed. Takes 4 tablets before a dental procedure   Coenzyme Q10 (COQ10) 100 MG CAPS Take 1 capsule by mouth daily.   doxycycline  (VIBRA -TABS) 100 MG tablet Take 1 tablet (100 mg total) by mouth 2 (two) times daily.   fluticasone (FLONASE) 50 MCG/ACT nasal spray Place into both nostrils daily.   Misc Natural Products (GLUCOSAMINE CHOND MSM FORMULA PO) Take 1,500 mg by mouth.   Multiple Vitamin (MULTI VITAMIN DAILY PO) Take 1 tablet by mouth.   Omega-3 Fatty Acids (FISH OIL) 1200 MG CAPS Take 1 capsule by mouth.   rosuvastatin  (CRESTOR ) 10 MG tablet Take 1 tablet (10 mg total)  by mouth every Monday, Wednesday, and Friday.   triamterene -hydrochlorothiazide  (MAXZIDE -25) 37.5-25 MG tablet TAKE 1/2 TABLET BY MOUTH DAILY    Past Medical History:  Diagnosis Date   Abnormal Pap smear    Allergy    Arthritis    Blood transfusion without reported diagnosis 04/26/1974   child birth   breast cancer 2009   right   Cancer [facility]) 2009   Breast Cancer   Cataract    CHF (congestive heart failure) (HCC) May 2018   heart attack   DDD (degenerative disc disease), lumbar    High cholesterol    Hyperlipidemia    Hypertension    Meniscus tear    right   Oxygen  deficiency May 2018   after heart attack   Personal history of radiation therapy    RBBB     Past Surgical History:  Procedure Laterality Date   BREAST BIOPSY Right 08/18/2007   BREAST LUMPECTOMY Right 2009   BREAST SURGERY Right 2009   lumpectomy, no radiation, no chemo   DILATION AND CURETTAGE OF UTERUS  2012   Dr.  Orval Sierra   HERNIA REPAIR  07/07   umbilical   JOINT REPLACEMENT Left 2012 & 2015   hip   LEFT HEART CATH AND CORONARY ANGIOGRAPHY N/A 09/17/2016   Procedure: Left Heart Cath and Coronary Angiography;  Surgeon: Bosie Vinie LABOR, MD;  Location: [facility] INVASIVE CV LAB;  Service: Cardiovascular;  Laterality: N/A;   TOTAL HIP ARTHROPLASTY Left    TOTAL KNEE ARTHROPLASTY Right 06/06/2013   Procedure: RIGHT TOTAL KNEE ARTHROPLASTY;  Surgeon: Dempsey LULLA Moan, MD;  Location: [facility] ORS;  Service: Orthopedics;  Laterality: Right;   TUBAL LIGATION  1980    Social History Social History   Tobacco Use   Smoking status: Never   Smokeless tobacco: Never  Vaping Use   Vaping status: Never Used  Substance Use Topics   Alcohol use: Yes    Comment: very little - once a month or so   Drug use: No    Family History Family History  Problem Relation Age of Onset   Cancer Mother        breast and colon   Breast cancer Mother    Hearing loss Mother    Varicose Veins Mother    Hypertension Father     Heart disease Father    Thyroid  disease Father    Hearing loss Father    Down syndrome Son    Intellectual disability Son    Varicose Veins Son    Asthma Other    Asthma Sister    Hearing loss Sister    Vision loss Sister    Asthma Brother    COPD Brother    Hearing loss Brother     Allergies  Allergen Reactions   Cefixime     REACTION: Rash   Ciprofloxacin Other (See Comments)    Bottom of feet burned and leg cramps   Levaquin  [Levofloxacin ] Other (See Comments)    Shoulder Bursitis   Penicillins     REACTION: Rash     REVIEW OF SYSTEMS (Negative unless checked)  Constitutional: [] Weight loss  [] Fever  [] Chills Cardiac: [] Chest pain   [] Chest pressure   [] Palpitations   [] Shortness of breath when laying flat   [] Shortness of breath with exertion. Vascular:  [x] Pain in legs with walking   [] Pain in legs at rest  [] History of DVT   [] Phlebitis   [] Swelling in legs   [] Varicose veins   [] Non-healing ulcers Pulmonary:   [] Uses home oxygen    [] Productive cough   [] Hemoptysis   [] Wheeze  [] COPD   [] Asthma Neurologic:  [] Dizziness   [] Seizures   [] History of stroke   [] History of TIA  [] Aphasia   [] Vissual changes   [] Weakness or numbness in arm   [] Weakness or numbness in leg Musculoskeletal:   [] Joint swelling   [] Joint pain   [] Low back pain Hematologic:  [] Easy bruising  [] Easy bleeding   [] Hypercoagulable state   [] Anemic Gastrointestinal:  [] Diarrhea   [] Vomiting  [] Gastroesophageal reflux/heartburn   [] Difficulty swallowing. Genitourinary:  [] Chronic kidney disease   [] Difficult urination  [] Frequent urination   [] Blood in urine Skin:  [] Rashes   [] Ulcers  Psychological:  [] History of anxiety   []  History of major depression.  Physical Examination  Vitals:   03/28/24 0939  BP: (!) 155/76  Pulse: (!) 55  Resp: 18  Weight: 203 lb 3.2 oz (92.2 kg)  Height: 5' 5 (1.651 m)   Body mass index is 33.81 kg/m. Gen: WD/WN, NAD Head: [place]/AT, No temporalis wasting.   Ear/Nose/Throat:  Hearing grossly intact, nares w/o erythema or drainage Eyes: PER, EOMI, sclera nonicteric.  Neck: Supple, no masses.  No bruit or JVD.  Pulmonary:  Good air movement, no audible wheezing, no use of accessory muscles.  Cardiac: RRR, normal S1, S2, no Murmurs. Vascular:  mild trophic changes, no open wounds Vessel Right Left  Radial Palpable Palpable  Gastrointestinal: soft, non-distended. No guarding/no peritoneal signs.  Musculoskeletal: M/S 5/5 throughout.  No visible deformity.  Neurologic: CN 2-12 intact. Pain and light touch intact in extremities.  Symmetrical.  Speech is fluent. Motor exam as listed above. Psychiatric: Judgment intact, Mood & affect appropriate for pt's clinical situation. Dermatologic: No rashes or ulcers noted.  No changes consistent with cellulitis.   CBC Lab Results  Component Value Date   WBC 3.7 (L) 03/08/2024   HGB 13.1 03/08/2024   HCT 38.9 03/08/2024   MCV 101.0 (H) 03/08/2024   PLT 132.0 (L) 03/08/2024    BMET    Component Value Date/Time   NA 142 03/08/2024 1006   NA 143 09/18/2014 1027   K 4.0 03/08/2024 1006   K 3.9 09/18/2014 1027   CL 104 03/08/2024 1006   CL 107 10/14/2012 0951   CO2 28 03/08/2024 1006   CO2 25 09/18/2014 1027   GLUCOSE 100 (H) 03/08/2024 1006   GLUCOSE 108 09/18/2014 1027   GLUCOSE 92 10/14/2012 0951   BUN 30 (H) 03/08/2024 1006   BUN 26.5 (H) 09/18/2014 1027   CREATININE 1.15 03/08/2024 1006   CREATININE 1.1 09/18/2014 1027   CALCIUM  9.4 03/08/2024 1006   CALCIUM  9.7 09/18/2014 1027   GFRNONAA 43 (L) 04/04/2022 1014   GFRAA 48 (L) 11/22/2019 1049   Estimated Creatinine Clearance: 43.1 mL/min (by C-G formula based on SCr of 1.15 mg/dL).  COAG Lab Results  Component Value Date   INR 1.08 09/12/2016   INR 1.06 05/30/2013   INR 2.86 (H) 06/29/2010    Radiology ECHOCARDIOGRAM COMPLETE Result Date: 03/11/2024    ECHOCARDIOGRAM REPORT   Patient Name:   Darlene Lawrence [place] [facility] Date of Exam:  03/11/2024 Medical Rec #:  [id number]            Height:       64.0 in Accession #:    [id number]           Weight:       210.0 lb Date of Birth:  Jun 03, 1940            BSA:          1.997 m Patient Age:    83 years             BP:           179/81 mmHg Patient Gender: F                    HR:           57 bpm. Exam Location:  Outpatient Procedure: 2D Echo, Cardiac Doppler, Color Doppler and Strain Analysis (Both            Spectral and Color Flow Doppler were utilized during procedure). STAT ECHO Indications:    CHF  History:        Patient has no prior history of Echocardiogram examinations.                 Arrythmias:RBBB; Risk Factors:Hypertension and Dyslipidemia.                 CKD, History  of Breast Cancer.  Sonographer:    Philomena Daring Referring Phys: DRU.[email or web address] AMY E BEDSOLE  Sonographer Comments: Global longitudinal strain was attempted. IMPRESSIONS  1. Left ventricular ejection fraction, by estimation, is 60 to 65%. The left ventricle has normal function. The left ventricle has no regional wall motion abnormalities. There is mild asymmetric left ventricular hypertrophy of the basal-septal segment. Left ventricular diastolic parameters are consistent with Grade I diastolic dysfunction (impaired relaxation). The average left ventricular global longitudinal strain is -15.5 %. The global longitudinal strain is abnormal.  2. Right ventricular systolic function is normal. The right ventricular size is normal.  3. The mitral valve is degenerative. Trivial mitral valve regurgitation. No evidence of mitral stenosis.  4. The aortic valve is tricuspid. There is mild calcification of the aortic valve. Aortic valve regurgitation is mild. Aortic valve sclerosis/calcification is present, without any evidence of aortic stenosis.  5. Significantly dilated aorta with effacement of the SoV. Aortic dilatation noted. Aneurysm of the ascending aorta, measuring 51 mm.  6. The inferior vena cava is normal in size with greater than 50%  respiratory variability, suggesting right atrial pressure of 3 mmHg. FINDINGS  Left Ventricle: Left ventricular ejection fraction, by estimation, is 60 to 65%. The left ventricle has normal function. The left ventricle has no regional wall motion abnormalities. The average left ventricular global longitudinal strain is -15.5 %. Strain was performed and the global longitudinal strain is abnormal. The left ventricular internal cavity size was normal in size. There is mild asymmetric left ventricular hypertrophy of the basal-septal segment. Left ventricular diastolic parameters are consistent with Grade I diastolic dysfunction (impaired relaxation). Right Ventricle: The right ventricular size is normal. No increase in right ventricular wall thickness. Right ventricular systolic function is normal. Left Atrium: Left atrial size was normal in size. Right Atrium: Right atrial size was normal in size. Pericardium: There is no evidence of pericardial effusion. Mitral Valve: The mitral valve is degenerative in appearance. Mild mitral annular calcification. Trivial mitral valve regurgitation. No evidence of mitral valve stenosis. Tricuspid Valve: The tricuspid valve is normal in structure. Tricuspid valve regurgitation is trivial. No evidence of tricuspid stenosis. Aortic Valve: The aortic valve is tricuspid. There is mild calcification of the aortic valve. Aortic valve regurgitation is mild. Aortic valve sclerosis/calcification is present, without any evidence of aortic stenosis. Pulmonic Valve: The pulmonic valve was normal in structure. Pulmonic valve regurgitation is not visualized. No evidence of pulmonic stenosis. Aorta: Significantly dilated aorta with effacement of the SoV. Aortic dilatation noted. There is an aneurysm involving the ascending aorta measuring 51 mm. Venous: The inferior vena cava is normal in size with greater than 50% respiratory variability, suggesting right atrial pressure of 3 mmHg. IAS/Shunts: No  atrial level shunt detected by color flow Doppler.  LEFT VENTRICLE PLAX 2D LVIDd:         3.90 cm   Diastology LVIDs:         2.60 cm   LV e' medial:    7.51 cm/s LV PW:         1.00 cm   LV E/e' medial:  10.1 LV IVS:        1.10 cm   LV e' lateral:   8.27 cm/s LVOT diam:     1.90 cm   LV E/e' lateral: 9.2 LV SV:         85 LV SV Index:   43        2D Longitudinal Strain LVOT Area:  2.84 cm  2D Strain GLS Avg:     -15.5 % LV IVRT:       77 msec  RIGHT VENTRICLE             IVC RV Basal diam:  3.30 cm     IVC diam: 1.70 cm RV Mid diam:    2.30 cm RV S prime:     10.20 cm/s TAPSE (M-mode): 1.8 cm LEFT ATRIUM             Index        RIGHT ATRIUM           Index LA diam:        3.80 cm 1.90 cm/m   RA Area:     13.90 cm LA Vol (A2C):   42.3 ml 21.18 ml/m  RA Volume:   31.60 ml  15.82 ml/m LA Vol (A4C):   45.0 ml 22.53 ml/m LA Biplane Vol: 44.3 ml 22.18 ml/m  AORTIC VALVE LVOT Vmax:   148.00 cm/s LVOT Vmean:  91.500 cm/s LVOT VTI:    0.301 m  AORTA Ao Root diam: 2.80 cm Ao Asc diam:  5.00 cm MITRAL VALVE                TRICUSPID VALVE MV Area (PHT): 3.81 cm     TR Peak grad:   5.2 mmHg MV Decel Time: 199 msec     TR Vmax:        114.00 cm/s MV E velocity: 76.00 cm/s MV A velocity: 102.00 cm/s  SHUNTS MV E/A ratio:  0.75         Systemic VTI:  0.30 m                             Systemic Diam: 1.90 cm Morene Brownie Electronically signed by Morene Brownie Signature Date/Time: 03/11/2024/8:47:20 PM    Final      Assessment/Plan 1. Aneurysm of ascending aorta without rupture (Primary) Recommend:  No surgery or intervention is indicated at this time.  The patient has an asymptomatic thoracic aortic aneurysm that is less than 6.0 cm in maximal diameter.  I have discussed the natural history of thoracic aortic aneurysm and the small risk of rupture for aneurysm less than 6.5 cm in size.  However, as these small aneurysms tend to enlarge over time, continued surveillance with CT scan is mandatory.   I  have also discussed optimizing medical management with hypertension and lipid control and the negative effect that any tobacco products have on aneurysmal disease.  The patient is also encouraged to exercise a minimum of 30 minutes 4 times a week.   Should the patient develop new onset chest or back pain or signs of peripheral embolization they are instructed to seek medical attention immediately and to alert the physician providing care that they have an aneurysm in the chest.   The patient voices their understanding.  The patient will return as ordered with a CT scan of the chest  2. PAD (peripheral artery disease) Recommend:  I do not find evidence of life style limiting vascular disease. The patient specifically denies life style limitation.  Previous noninvasive studies including ABI's of the legs do not identify critical vascular problems.  The patient should continue walking and begin a more formal exercise program. The patient should continue his antiplatelet therapy and aggressive treatment of the lipid abnormalities.  The patient is instructed to call the  office if there is a significant change in the lower extremity symptoms, particularly if a wound develops or there is an abrupt increase in leg pain.  3. Essential hypertension Continue antihypertensive medications as already ordered, these medications have been reviewed and there are no changes at this time.  4. Mixed hyperlipidemia Continue statin as ordered and reviewed, no changes at this time    Cordella Shawl, MD  03/28/2024 9:43 AM

## 2024-04-10 DIAGNOSIS — Z23 Encounter for immunization: Secondary | ICD-10-CM | POA: Diagnosis not present

## 2024-04-11 DIAGNOSIS — R55 Syncope and collapse: Secondary | ICD-10-CM | POA: Diagnosis not present

## 2024-04-12 DIAGNOSIS — H353211 Exudative age-related macular degeneration, right eye, with active choroidal neovascularization: Secondary | ICD-10-CM | POA: Diagnosis not present

## 2024-04-12 DIAGNOSIS — M5136 Other intervertebral disc degeneration, lumbar region with discogenic back pain only: Secondary | ICD-10-CM | POA: Diagnosis not present

## 2024-04-12 DIAGNOSIS — M9903 Segmental and somatic dysfunction of lumbar region: Secondary | ICD-10-CM | POA: Diagnosis not present

## 2024-04-12 DIAGNOSIS — M9902 Segmental and somatic dysfunction of thoracic region: Secondary | ICD-10-CM | POA: Diagnosis not present

## 2024-04-12 DIAGNOSIS — M62830 Muscle spasm of back: Secondary | ICD-10-CM | POA: Diagnosis not present

## 2024-04-12 DIAGNOSIS — H2513 Age-related nuclear cataract, bilateral: Secondary | ICD-10-CM | POA: Diagnosis not present

## 2024-04-15 DIAGNOSIS — I471 Supraventricular tachycardia, unspecified: Secondary | ICD-10-CM | POA: Diagnosis not present

## 2024-04-15 DIAGNOSIS — I77810 Thoracic aortic ectasia: Secondary | ICD-10-CM | POA: Diagnosis not present

## 2024-04-15 DIAGNOSIS — I10 Essential (primary) hypertension: Secondary | ICD-10-CM | POA: Diagnosis not present

## 2024-04-15 DIAGNOSIS — E782 Mixed hyperlipidemia: Secondary | ICD-10-CM | POA: Diagnosis not present

## 2024-04-16 ENCOUNTER — Encounter (INDEPENDENT_AMBULATORY_CARE_PROVIDER_SITE_OTHER): Payer: Self-pay | Admitting: Vascular Surgery

## 2024-06-07 ENCOUNTER — Telehealth: Admitting: Family Medicine

## 2024-06-07 DIAGNOSIS — B9689 Other specified bacterial agents as the cause of diseases classified elsewhere: Secondary | ICD-10-CM | POA: Diagnosis not present

## 2024-06-07 DIAGNOSIS — J208 Acute bronchitis due to other specified organisms: Secondary | ICD-10-CM

## 2024-06-07 MED ORDER — DOXYCYCLINE HYCLATE 100 MG PO TABS: 100.0000 mg | ORAL_TABLET | Freq: Two times a day (BID) | ORAL | 0 refills | Status: AC
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
## 2024-06-07 NOTE — Patient Instructions (Signed)

## 2025-01-20 ENCOUNTER — Other Ambulatory Visit: Payer: PRIVATE HEALTH INSURANCE

## 2025-01-25 ENCOUNTER — Ambulatory Visit: Payer: PRIVATE HEALTH INSURANCE

## 2025-01-27 ENCOUNTER — Encounter: Payer: PRIVATE HEALTH INSURANCE | Admitting: Family Medicine
